# Patient Record
Sex: Female | Born: 1950 | Race: White | Hispanic: No | Marital: Single | Smoking: Never smoker
Health system: Southern US, Community
[De-identification: ages and names within clinical notes are randomized; demographics above are authoritative.]

## PROBLEM LIST (undated history)

## (undated) DIAGNOSIS — I10 Essential (primary) hypertension: Secondary | ICD-10-CM

## (undated) DIAGNOSIS — I482 Chronic atrial fibrillation, unspecified: Secondary | ICD-10-CM

## (undated) DIAGNOSIS — N183 Chronic kidney disease, stage 3 unspecified: Secondary | ICD-10-CM

## (undated) DIAGNOSIS — I6930 Unspecified sequelae of cerebral infarction: Secondary | ICD-10-CM

## (undated) DIAGNOSIS — M1990 Unspecified osteoarthritis, unspecified site: Secondary | ICD-10-CM

## (undated) DIAGNOSIS — F1910 Other psychoactive substance abuse, uncomplicated: Secondary | ICD-10-CM

## (undated) DIAGNOSIS — E119 Type 2 diabetes mellitus without complications: Secondary | ICD-10-CM

## (undated) HISTORY — DX: Essential (primary) hypertension: I10

## (undated) HISTORY — DX: Other psychoactive substance abuse, uncomplicated: F19.10

## (undated) HISTORY — DX: Unspecified osteoarthritis, unspecified site: M19.90

## (undated) HISTORY — DX: Type 2 diabetes mellitus without complications: E11.9

## (undated) HISTORY — PX: BREAST BIOPSY: SHX20

---

## 2003-01-18 ENCOUNTER — Other Ambulatory Visit: Payer: Self-pay

## 2008-02-24 ENCOUNTER — Ambulatory Visit: Payer: Self-pay | Admitting: Unknown Physician Specialty

## 2008-03-08 ENCOUNTER — Ambulatory Visit: Payer: Self-pay | Admitting: Internal Medicine

## 2009-09-19 ENCOUNTER — Emergency Department: Payer: Self-pay | Admitting: Emergency Medicine

## 2010-08-15 ENCOUNTER — Ambulatory Visit: Payer: Self-pay | Admitting: Internal Medicine

## 2011-08-21 ENCOUNTER — Ambulatory Visit: Payer: Self-pay | Admitting: Internal Medicine

## 2012-06-01 IMAGING — CT CT MAXILLOFACIAL WITHOUT CONTRAST
1 series · 16 of 30 positions shown, 20 images · non-contrast
Comparison: None.

REASON FOR EXAM: fall  pain to  right periorbital
COMMENTS:

PROCEDURE:     CT  - CT MAXILLOFACIAL AREA WO  - September 19, 2009  [DATE]
RESULT:     CT of the face..
TECHNIQUE: Multiple axial images were obtained of the phase, without
intravenous contrast. Coronal reformations were performed.

[Series 2: facial 3.0 h60f · axial · 0.31mm/px · z∈[-62,+76]mm · 16 of 50 slices shown, 20 images]
[im 2/50  brain]
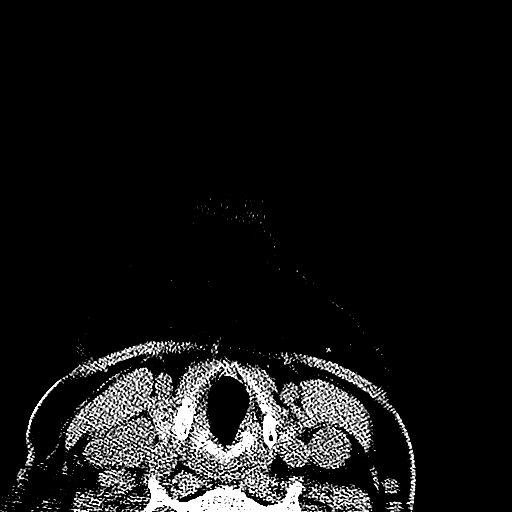
[im 2/50  bone]
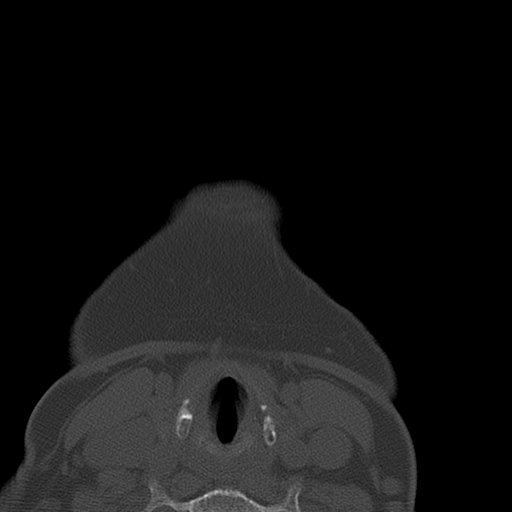
[im 6/50  bone]
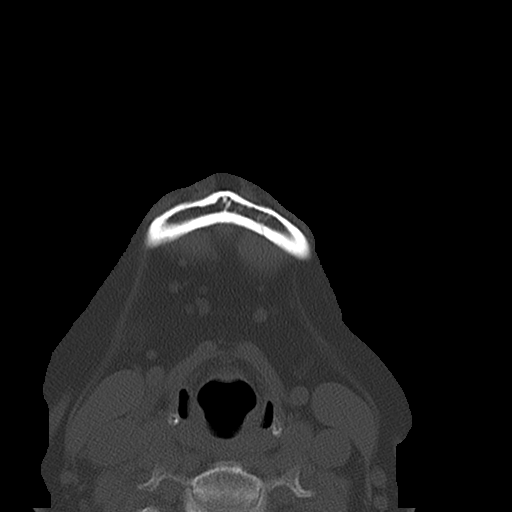
[im 9/50  bone]
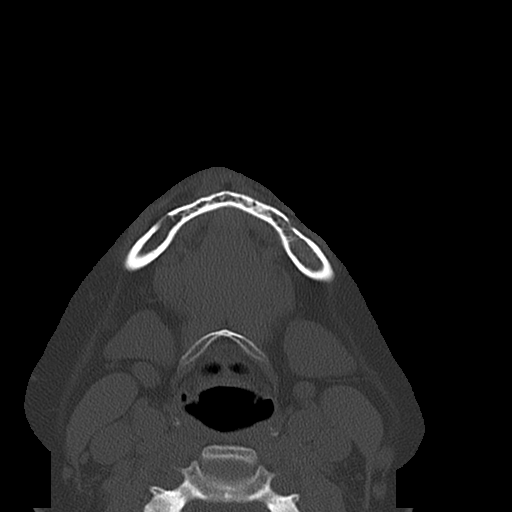
[im 11/50  bone]
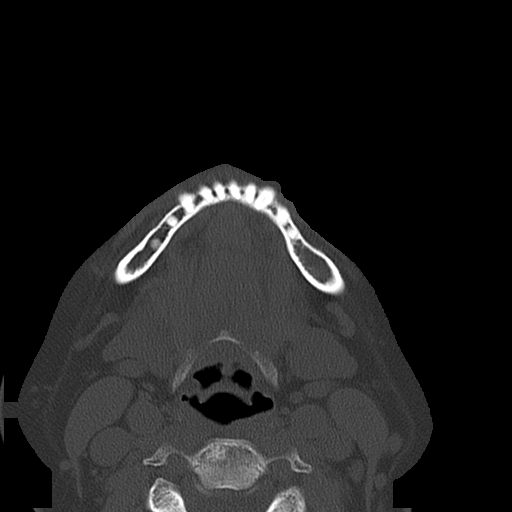
[im 14/50  brain]
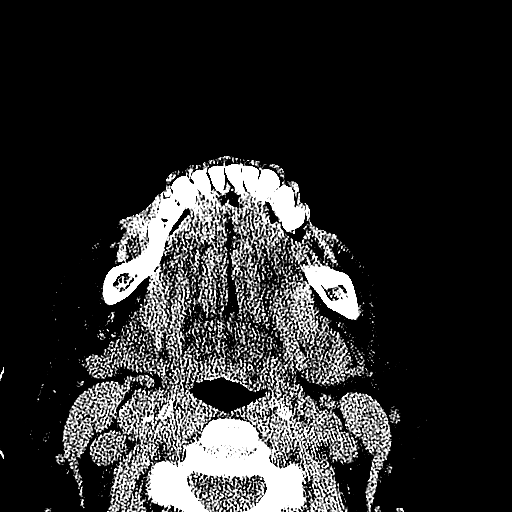
[im 14/50  bone]
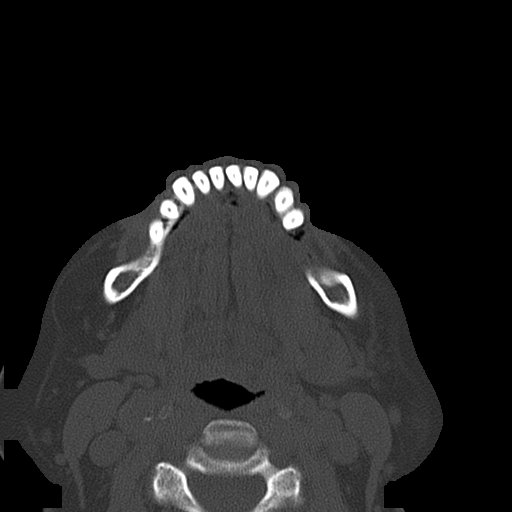
[im 19/50  bone]
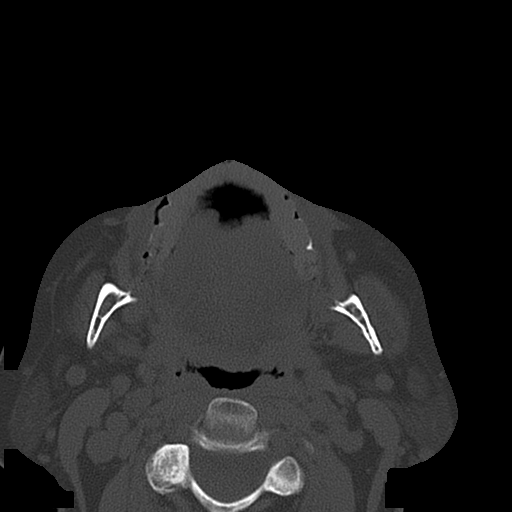
[im 21/50  bone]
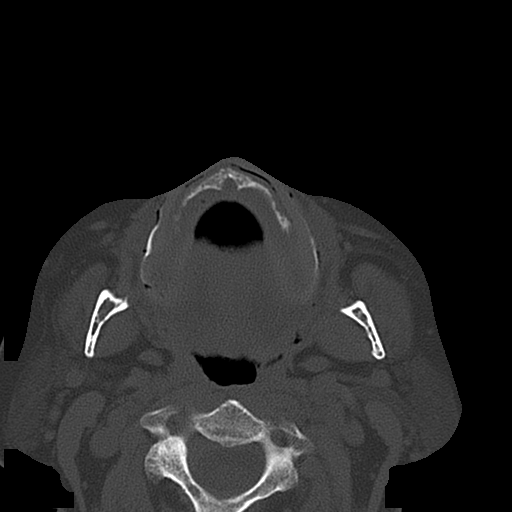
[im 24/50  bone]
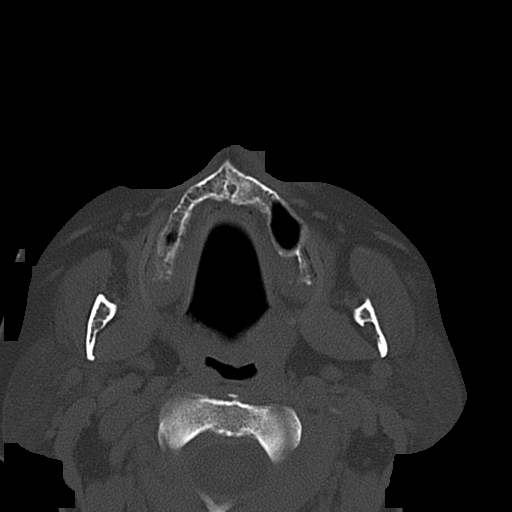
[im 28/50  brain]
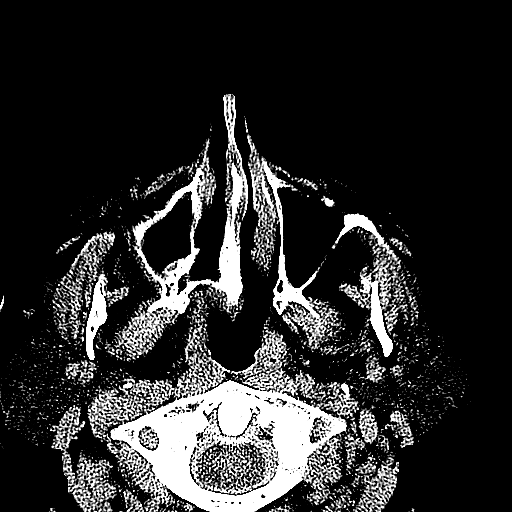
[im 28/50  bone]
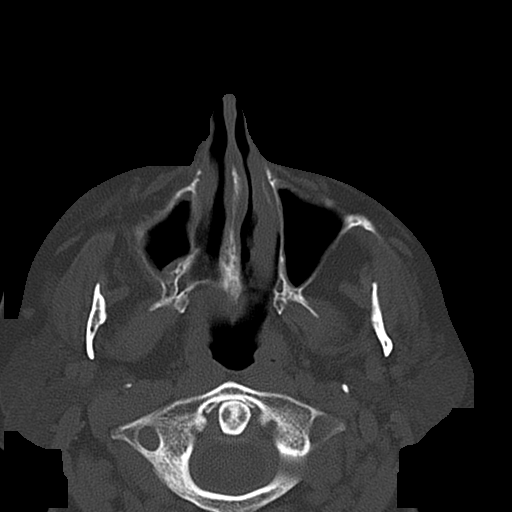
[im 31/50  bone]
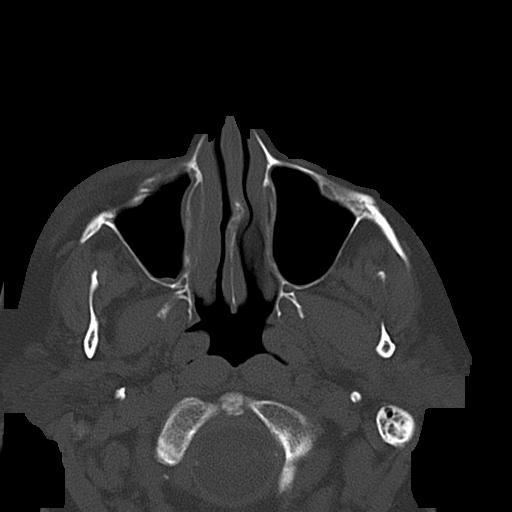
[im 33/50  bone]
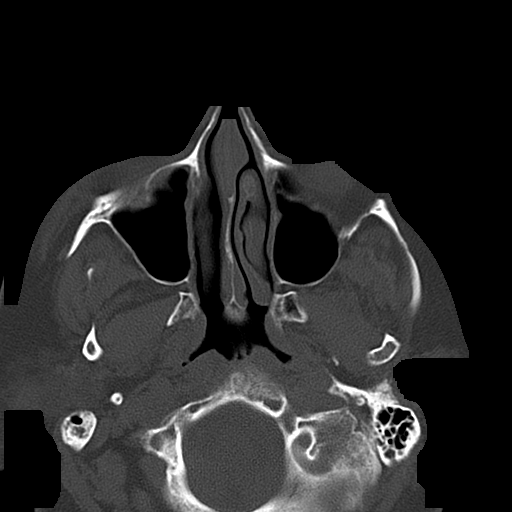
[im 36/50  bone]
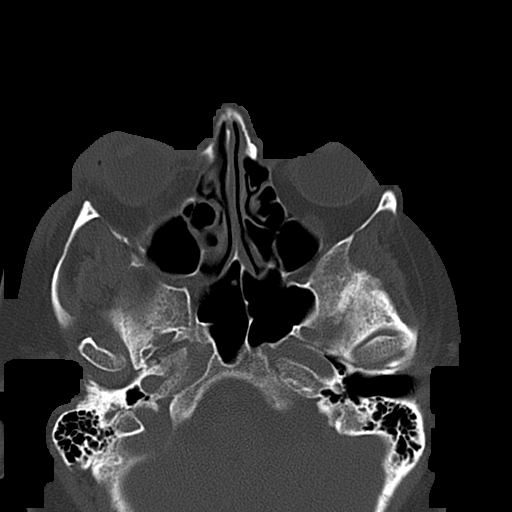
[im 39/50  brain]
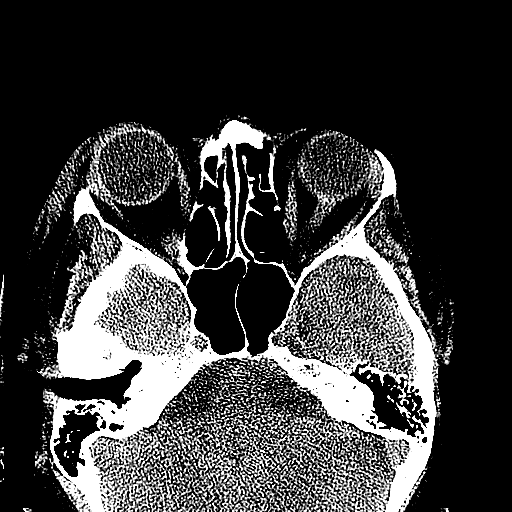
[im 39/50  bone]
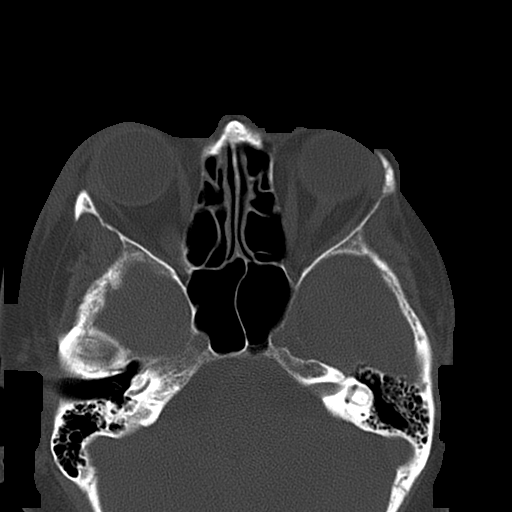
[im 41/50  bone]
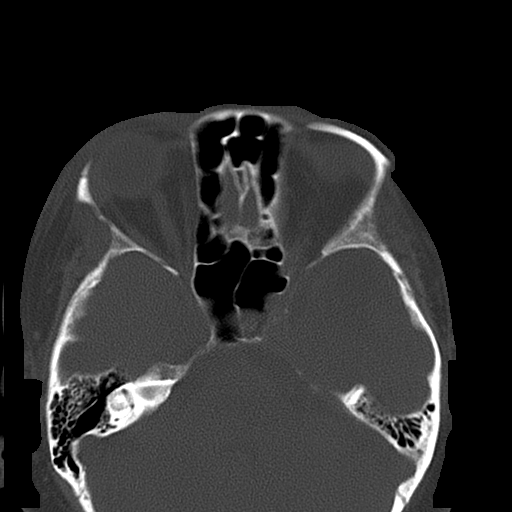
[im 44/50  bone]
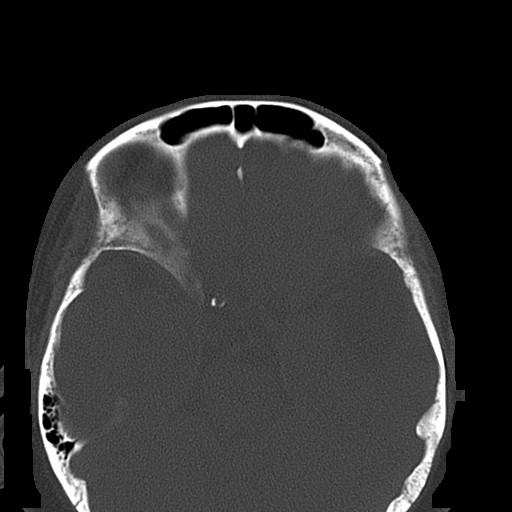
[im 48/50  bone]
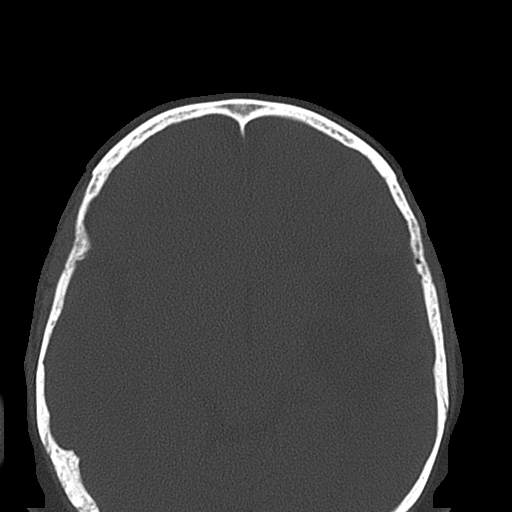

[16 of 30 positions shown; findings below may reference images not displayed]

FINDINGS: There is preseptal soft tissue edema superficial to the right
orbit. There is minimal buckling of the lateral wall of the right orbit.
This may represent a nondisplaced fracture, image 39. The floor of the orbit
is intact. The zygoma is intact.

There is minimal mucosal thickening of the right maxillary sinus and
sphenoid sinus.
IMPRESSION: Minimal buckling of the lateral wall of the right orbit may represent a
subtle nondisplaced fracture.

## 2012-08-25 ENCOUNTER — Ambulatory Visit: Payer: Self-pay | Admitting: Internal Medicine

## 2012-12-25 ENCOUNTER — Encounter: Payer: Self-pay | Admitting: Podiatry

## 2012-12-25 ENCOUNTER — Ambulatory Visit (INDEPENDENT_AMBULATORY_CARE_PROVIDER_SITE_OTHER): Payer: Medicare Other | Admitting: Podiatry

## 2012-12-25 VITALS — BP 138/90 | HR 96 | Resp 16 | Ht 64.0 in | Wt 238.0 lb

## 2012-12-25 DIAGNOSIS — Q828 Other specified congenital malformations of skin: Secondary | ICD-10-CM

## 2012-12-25 DIAGNOSIS — B351 Tinea unguium: Secondary | ICD-10-CM

## 2012-12-25 DIAGNOSIS — E1159 Type 2 diabetes mellitus with other circulatory complications: Secondary | ICD-10-CM

## 2012-12-25 DIAGNOSIS — M79609 Pain in unspecified limb: Secondary | ICD-10-CM

## 2012-12-25 NOTE — Progress Notes (Signed)
Subjective:     Patient ID: Sandra Massey, female   DOB: Feb 18, 1951, 62 y.o.   MRN: [id number]  HPI patient presents with caregiver stating my nails and I have these bad corns on my big toe second toe right and underneath my left fifth metatarsal. Patient is at risk diabetic and cannot care for herself secondary to stroke and diabetes  Review of Systems  All other systems reviewed and are negative.       Objective:   Physical Exam  Constitutional: She appears well-developed and well-nourished.  Neurological: She is alert.  Skin: Skin is dry.   Patient has nail disease 1-5 bilateral with painful debris noted and incurvation. 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

## 2013-04-02 ENCOUNTER — Encounter: Payer: Self-pay | Admitting: Podiatry

## 2013-04-02 ENCOUNTER — Ambulatory Visit (INDEPENDENT_AMBULATORY_CARE_PROVIDER_SITE_OTHER): Payer: Medicare HMO | Admitting: Podiatry

## 2013-04-02 VITALS — BP 122/75 | HR 85 | Resp 18

## 2013-04-02 DIAGNOSIS — Q828 Other specified congenital malformations of skin: Secondary | ICD-10-CM

## 2013-04-02 DIAGNOSIS — B351 Tinea unguium: Secondary | ICD-10-CM

## 2013-04-02 DIAGNOSIS — E1159 Type 2 diabetes mellitus with other circulatory complications: Secondary | ICD-10-CM

## 2013-04-02 DIAGNOSIS — M79609 Pain in unspecified limb: Secondary | ICD-10-CM

## 2013-04-02 NOTE — Progress Notes (Signed)
Subjective:     Patient ID: Sandra Massey, female   DOB: [birth year], 63 y.o.   MRN: [id number]  HPI patient presents with nail disease 1-5 both feet and keratotic lesion fifth metatarsal both feet and between the big toe and second toe right foot. States she cannot take care the nails or lesions do to stroke and long-term diabetes   Review of Systems     Objective:   Physical Exam Neurovascular status unchanged from previous visit with keratotic lesion x4 and nail disease with thickness and pain 1-5 both feet    Assessment:     Debridement nailbeds 1-5 both feet with no iatrogenic bleeding noted and debridement lesions on both feet with no iatrogenic bleeding noted    Plan:     Above is plan the assessment is mycotic nails with pain and porokeratotic lesion with at risk patient

## 2013-04-02 NOTE — Progress Notes (Signed)
Toenails trimmed , the calluses and the corns

## 2013-05-03 ENCOUNTER — Telehealth: Payer: Self-pay | Admitting: *Deleted

## 2013-05-03 NOTE — Telephone Encounter (Signed)
Called and left message letting pt know that dr Rosario Jacks has not signed diabetic shoe order. Asked for pt to call her doctors office to find out why he did not sign. (pt may need appt to see dr Rosario Jacks).

## 2013-05-25 ENCOUNTER — Encounter: Payer: Self-pay | Admitting: *Deleted

## 2013-05-25 NOTE — Progress Notes (Signed)
SENT PT POST CARD LETTING HER KNOW DIABETIC SHOES ARE HERE.

## 2013-06-01 ENCOUNTER — Ambulatory Visit (INDEPENDENT_AMBULATORY_CARE_PROVIDER_SITE_OTHER): Payer: Medicare HMO | Admitting: Podiatry

## 2013-06-01 ENCOUNTER — Encounter: Payer: Self-pay | Admitting: Podiatry

## 2013-06-01 DIAGNOSIS — M2040 Other hammer toe(s) (acquired), unspecified foot: Secondary | ICD-10-CM

## 2013-06-01 DIAGNOSIS — E1159 Type 2 diabetes mellitus with other circulatory complications: Secondary | ICD-10-CM

## 2013-06-01 DIAGNOSIS — M2010 Hallux valgus (acquired), unspecified foot: Secondary | ICD-10-CM

## 2013-06-02 NOTE — Progress Notes (Signed)
Subjective:     Patient ID: Sandra Massey, female   DOB: 28-May-1950, 63 y.o.   MRN: [id number]  HPI patient presents to pick up diabetic shoes with structural issues in her feet inability to walk with any degree of comfort and long-term diabetes   Review of Systems     Objective:   Physical Exam Chronic structural disease with long-term diabetes    Assessment:     Neurovascular status unchanged with structural deformity of feet him at risk condition    Plan:     Long-term diabetes with problems diabetic shoes were prescribed and fitted today they fit well and are comfortable for

## 2013-07-02 ENCOUNTER — Ambulatory Visit: Payer: Medicare HMO | Admitting: Podiatry

## 2013-07-09 ENCOUNTER — Ambulatory Visit (INDEPENDENT_AMBULATORY_CARE_PROVIDER_SITE_OTHER): Payer: Medicare HMO | Admitting: Podiatry

## 2013-07-09 VITALS — Resp 16 | Ht 65.0 in | Wt 230.0 lb

## 2013-07-09 DIAGNOSIS — B351 Tinea unguium: Secondary | ICD-10-CM

## 2013-07-09 DIAGNOSIS — Q828 Other specified congenital malformations of skin: Secondary | ICD-10-CM

## 2013-07-09 DIAGNOSIS — E1159 Type 2 diabetes mellitus with other circulatory complications: Secondary | ICD-10-CM

## 2013-07-09 DIAGNOSIS — M79609 Pain in unspecified limb: Secondary | ICD-10-CM

## 2013-07-11 NOTE — Progress Notes (Signed)
Subjective:     Patient ID: Sandra Massey, female   DOB: Mar 11, 1951, 63 y.o.   MRN: [id number]  HPI patient is a poor health individuals long-term diabetes history of stroke and nail disease 1-5 both feet with thick lesions on both feet they become painful and are impossible for her to cut   Review of Systems     Objective:   Physical Exam Neurovascular status unchanged with patient well oriented but does have caregiver. Nailbeds are brittle thick yellow and painful when pressed and there is keratotic lesion sub-first metatarsal of both feet    Assessment:     At risk diabetic who wears shoes which are helpful but has nail disease which forms that are painful with mycosis and lesions with porokeratotic type base    Plan:     Debridement of painful nailbeds 1-5 both feet and debridement of lesions on both feet with no iatrogenic bleeding noted

## 2013-08-27 ENCOUNTER — Ambulatory Visit: Payer: Self-pay | Admitting: Internal Medicine

## 2013-10-08 ENCOUNTER — Ambulatory Visit: Payer: Medicare HMO | Admitting: Podiatry

## 2013-10-15 ENCOUNTER — Ambulatory Visit (INDEPENDENT_AMBULATORY_CARE_PROVIDER_SITE_OTHER): Payer: Medicare HMO | Admitting: Podiatry

## 2013-10-15 DIAGNOSIS — Q828 Other specified congenital malformations of skin: Secondary | ICD-10-CM

## 2013-10-15 DIAGNOSIS — M79609 Pain in unspecified limb: Secondary | ICD-10-CM

## 2013-10-15 DIAGNOSIS — B351 Tinea unguium: Secondary | ICD-10-CM

## 2013-10-15 DIAGNOSIS — E1159 Type 2 diabetes mellitus with other circulatory complications: Secondary | ICD-10-CM

## 2013-10-15 DIAGNOSIS — M79676 Pain in unspecified toe(s): Secondary | ICD-10-CM

## 2013-10-15 NOTE — Progress Notes (Signed)
Subjective:     Patient ID: Sandra Massey, female   DOB: 02/08/1951, 63 y.o.   MRN: [id number]  HPI patient presents with significant nail disease 1-5 both feet with pain and keratotic lesion fifth digit right that's painful when pressed and making shoe gear difficult along with long-term diabetes   Review of Systems     Objective:   Physical Exam Neurovascular status is diminished both sharp Dole vibratory and circulatory status bilateral with varicosities in the ankle and mild edema noted patient's found to have keratotic lesion fifth metatarsal right lateral side and nail disease with thickness 1-5 of both feet    Assessment:     Mycotic nail infection both feet and porokeratotic type lesion and at risk diabetic    Plan:     Debridement nailbeds 1-5 both feet with no iatrogenic bleeding and debridement lesion right with no iatrogenic bleeding noted

## 2014-01-14 ENCOUNTER — Other Ambulatory Visit: Payer: Medicare HMO

## 2014-01-21 ENCOUNTER — Ambulatory Visit (INDEPENDENT_AMBULATORY_CARE_PROVIDER_SITE_OTHER): Payer: Medicare HMO | Admitting: Podiatry

## 2014-01-21 DIAGNOSIS — B351 Tinea unguium: Secondary | ICD-10-CM

## 2014-01-21 DIAGNOSIS — M79676 Pain in unspecified toe(s): Secondary | ICD-10-CM

## 2014-01-21 NOTE — Progress Notes (Signed)
Subjective:     Patient ID: Sandra Massey, female   DOB: 1950-06-27, 63 y.o.   MRN: [id number]  HPI patient presents with significant nail disease 1-5 both feet with pain and keratotic lesion fifth digit right that's painful when pressed and making shoe gear difficult along with long-term diabetes   Review of Systems     Objective:   Physical Exam Neurovascular status is diminished both sharp Dole vibratory and circulatory status bilateral with varicosities in the ankle and mild edema noted patient's found to have keratotic lesion fifth metatarsal right lateral side and nail disease with thickness 1-5 of both feet    Assessment:     Mycotic nail infection both feet and porokeratotic type lesion and at risk diabetic    Plan:     Debridement nailbeds 1-5 both feet with no iatrogenic bleeding and debridement lesion right with no iatrogenic bleeding noted

## 2014-04-22 ENCOUNTER — Other Ambulatory Visit: Payer: Medicare HMO

## 2014-05-06 ENCOUNTER — Ambulatory Visit (INDEPENDENT_AMBULATORY_CARE_PROVIDER_SITE_OTHER): Payer: Medicare HMO | Admitting: Podiatry

## 2014-05-06 DIAGNOSIS — M79676 Pain in unspecified toe(s): Secondary | ICD-10-CM

## 2014-05-06 DIAGNOSIS — E1151 Type 2 diabetes mellitus with diabetic peripheral angiopathy without gangrene: Secondary | ICD-10-CM

## 2014-05-06 DIAGNOSIS — Q828 Other specified congenital malformations of skin: Secondary | ICD-10-CM

## 2014-05-06 DIAGNOSIS — B351 Tinea unguium: Secondary | ICD-10-CM

## 2014-05-06 DIAGNOSIS — M2041 Other hammer toe(s) (acquired), right foot: Secondary | ICD-10-CM

## 2014-05-06 NOTE — Progress Notes (Signed)
Subjective:     Patient ID: Sandra Massey, female   DOB: 09/15/1950, 64 y.o.   MRN: [id number]  HPI patient presents with significant nail disease 1-5 both feet with pain and keratotic lesion fifth digit right that's painful when pressed and making shoe gear difficult along with long-term diabetes   Review of Systems     Objective:   Physical Exam Neurovascular status is diminished both sharp Dole vibratory and circulatory status bilateral with varicosities in the ankle and mild edema noted patient's found to have keratotic lesion fifth metatarsal right lateral side and nail disease with thickness 1-5 of both feet    Assessment:     Mycotic nail infection both feet and porokeratotic type lesion and at risk diabetic    Plan:     Debridement nailbeds 1-5 both feet with no iatrogenic bleeding and debridement lesion right with no iatrogenic bleeding noted   HM noted and reduced R 1-2 toes.

## 2014-05-06 NOTE — Progress Notes (Signed)
Subjective:     Patient ID: Sandra Massey, female   DOB: 02-24-1951, 64 y.o.   MRN: [id number]  HPI long-term diabetic who presents his had history of stroke and has nail disease 1-5 both feet that are thick and she cannot cut and become painful and lesion fifth right and has done well in diabetic shoes but needs a new pair at this time   Review of Systems     Objective:   Physical Exam Neurovascular status is diminished with diminished pedal pulses PT and DP diminished hair growth and diminished Fill time. I noted also there is some diminishment of sharp dull and vibratory with keratotic lesion fifth right that's painful and nail disease with thickness yellow brittle debris 1-5 both feet that are painful    Assessment:     At risk diabetic with mycotic nail infection lesion and pre-ulcerative type lesion on the fifth toe of the right foot    Plan:     Reviewed all conditions and I do think it would be best to go ahead and get her into new diabetic shoes which we will get certified. Debrided nailbeds 1-5 both feet and lesion right with no iatrogenic bleeding noted

## 2014-06-24 ENCOUNTER — Ambulatory Visit: Payer: Medicare HMO

## 2014-06-28 ENCOUNTER — Ambulatory Visit (INDEPENDENT_AMBULATORY_CARE_PROVIDER_SITE_OTHER): Payer: Medicare HMO | Admitting: Podiatry

## 2014-06-28 ENCOUNTER — Encounter: Payer: Self-pay | Admitting: Podiatry

## 2014-06-28 DIAGNOSIS — E1159 Type 2 diabetes mellitus with other circulatory complications: Secondary | ICD-10-CM

## 2014-06-28 DIAGNOSIS — L97509 Non-pressure chronic ulcer of other part of unspecified foot with unspecified severity: Secondary | ICD-10-CM | POA: Diagnosis not present

## 2014-06-28 DIAGNOSIS — E1151 Type 2 diabetes mellitus with diabetic peripheral angiopathy without gangrene: Secondary | ICD-10-CM

## 2014-06-28 DIAGNOSIS — E1140 Type 2 diabetes mellitus with diabetic neuropathy, unspecified: Secondary | ICD-10-CM

## 2014-06-28 DIAGNOSIS — E1342 Other specified diabetes mellitus with diabetic polyneuropathy: Secondary | ICD-10-CM | POA: Diagnosis not present

## 2014-06-28 NOTE — Progress Notes (Signed)
Subjective:     Patient ID: Sandra Massey, female   DOB: [birth year], 64 y.o.   MRN: [id number]  HPI long-term diabetic with structural deformities of her feet presents for diabetic shoes that she has had for a number of years and have  prevent ulceration   Review of Systems     Objective:   Physical Exam Neurovascular status intact muscle strength adequate range of motion within normal limits with diminishment of vascular status both DP PT pulses diminished hair growth and structural bunion deformity bilateral    Assessment:     At risk diabetic with structural deformity    Plan:     Diabetic shoes were dispensed with all instructions on usage and reappoint for routine care

## 2014-06-28 NOTE — Patient Instructions (Signed)

## 2014-08-05 ENCOUNTER — Ambulatory Visit: Payer: Medicare HMO

## 2014-10-25 ENCOUNTER — Ambulatory Visit (INDEPENDENT_AMBULATORY_CARE_PROVIDER_SITE_OTHER): Payer: Medicare HMO | Admitting: Podiatry

## 2014-10-25 DIAGNOSIS — M79676 Pain in unspecified toe(s): Secondary | ICD-10-CM

## 2014-10-25 DIAGNOSIS — B351 Tinea unguium: Secondary | ICD-10-CM | POA: Diagnosis not present

## 2014-10-25 DIAGNOSIS — L84 Corns and callosities: Secondary | ICD-10-CM

## 2014-10-25 NOTE — Progress Notes (Signed)
Subjective: 64 y.o. returns the office today for painful, elongated, thickened toenails which she is unable to trim herself. Denies any redness or drainage around the nails. Also has a corn between the right 1st and 2nd digits. No redness/drainage. Denies any acute changes since last appointment and no new complaints today. Denies any systemic complaints such as fevers, chills, nausea, vomiting.   Objective: AAO 3, NAD DP/PT pulses decreased, CRT less than 3 seconds Nails hypertrophic, dystrophic, elongated, brittle, discolored 10. There is tenderness overlying the nails 1-5 bilaterally. There is no surrounding erythema or drainage along the nail sites. Associated hyperkeratotic lesion in the right first interspace and the first and second digit in the sulcus. Upon debridement no underlying ulceration drainage or other signs of infection. No open lesions or pre-ulcerative lesions are identified. No other areas of tenderness bilateral lower extremities. No overlying edema, erythema, increased warmth. No pain with calf compression, swelling, warmth, erythema.  Assessment: Patient presents with symptomatic onychomycosis, heloma molle right first interspace  Plan: -Treatment options including alternatives, risks, complications were discussed -Nails sharply debrided *[id number] complication/bleeding. -Right first interspace lesion sharply debrided without complication/bleeding. -Discussed daily foot inspection. If there are any changes, to call the office immediately.  -Follow-up in 3 months or sooner if any problems are to arise. In the meantime, encouraged to call the office with any questions, concerns, changes symptoms.   Celesta Gentile, DPM

## 2014-12-21 ENCOUNTER — Emergency Department: Payer: Medicare HMO

## 2014-12-21 ENCOUNTER — Emergency Department
Admission: EM | Admit: 2014-12-21 | Discharge: 2014-12-21 | Disposition: A | Discharge status: Home / Self Care | Payer: Medicare HMO | Attending: Emergency Medicine | Admitting: Emergency Medicine

## 2014-12-21 DIAGNOSIS — Y9389 Activity, other specified: Secondary | ICD-10-CM | POA: Diagnosis not present

## 2014-12-21 DIAGNOSIS — Z79899 Other long term (current) drug therapy: Secondary | ICD-10-CM | POA: Diagnosis not present

## 2014-12-21 DIAGNOSIS — S46911A Strain of unspecified muscle, fascia and tendon at shoulder and upper arm level, right arm, initial encounter: Secondary | ICD-10-CM | POA: Insufficient documentation

## 2014-12-21 DIAGNOSIS — E119 Type 2 diabetes mellitus without complications: Secondary | ICD-10-CM | POA: Diagnosis not present

## 2014-12-21 DIAGNOSIS — Y998 Other external cause status: Secondary | ICD-10-CM | POA: Diagnosis not present

## 2014-12-21 DIAGNOSIS — G8929 Other chronic pain: Secondary | ICD-10-CM | POA: Insufficient documentation

## 2014-12-21 DIAGNOSIS — S161XXA Strain of muscle, fascia and tendon at neck level, initial encounter: Secondary | ICD-10-CM | POA: Diagnosis not present

## 2014-12-21 DIAGNOSIS — Y92002 Bathroom of unspecified non-institutional (private) residence single-family (private) house as the place of occurrence of the external cause: Secondary | ICD-10-CM | POA: Diagnosis not present

## 2014-12-21 DIAGNOSIS — I10 Essential (primary) hypertension: Secondary | ICD-10-CM | POA: Diagnosis not present

## 2014-12-21 DIAGNOSIS — W1839XA Other fall on same level, initial encounter: Secondary | ICD-10-CM | POA: Diagnosis not present

## 2014-12-21 DIAGNOSIS — Z91040 Latex allergy status: Secondary | ICD-10-CM | POA: Insufficient documentation

## 2014-12-21 DIAGNOSIS — Z7982 Long term (current) use of aspirin: Secondary | ICD-10-CM | POA: Insufficient documentation

## 2014-12-21 DIAGNOSIS — S199XXA Unspecified injury of neck, initial encounter: Secondary | ICD-10-CM | POA: Diagnosis present

## 2014-12-21 MED ORDER — CARISOPRODOL 350 MG PO TABS: 350.0000 mg | ORAL_TABLET | Freq: Three times a day (TID) | ORAL | Status: DC

## 2014-12-21 NOTE — ED Notes (Signed)
Pain right shoulder "rotary cuff" per fam member.  And neck pain.  fam member says pt has hx stroke that limits her speech.

## 2014-12-21 NOTE — ED Provider Notes (Signed)
[facility] Provider Note  ____________________________________________  Time seen: Approximately 11:15 AM  I have reviewed the triage vital signs and the nursing notes.   HISTORY  Chief Complaint Torticollis and Shoulder Pain   HPI Sandra Massey is a 64 y.o. female who presents to the emergency department for evaluation of neck pain and right shoulder pain. She has a history of chronic right shoulder pain, but this is worse after falling approximately one week ago. Her caretaker states that she fell in the bathroom and "knock the commode off of its base." She was not evaluated after the fall. For the past 3 days she has been complaining of increasing neck pain and right shoulder pain. The patient has a significant history of CVA on the right side which has left her weak. The patient denies an increase in weakness. The caretaker denies a change in or cognitive status or mobility.   Past Medical History  Diagnosis Date  . Substance abuse   . Hypertension   . Arthritis   . Diabetes mellitus without complication     There are no active problems to display for this patient.   No past surgical history on file.  Current Outpatient Rx  Name  Route  Sig  Dispense  Refill  . aspirin 325 MG tablet   Oral   Take 325 mg by mouth daily.         . calcium carbonate (OS-CAL) 600 MG TABS tablet   Oral   Take 600 mg by mouth daily.         . carisoprodol (SOMA) 350 MG tablet   Oral   Take 1 tablet (350 mg total) by mouth 3 (three) times daily.   15 tablet   0   . cholecalciferol (VITAMIN D) 1000 UNITS tablet   Oral   Take 1,000 Units by mouth daily.         Marland Kitchen escitalopram (LEXAPRO) 10 MG tablet   Oral   Take 10 mg by mouth daily.         Marland Kitchen glipiZIDE (GLUCOTROL) 5 MG tablet   Oral   Take 5 mg by mouth daily before breakfast.         . lisinopril-hydrochlorothiazide (PRINZIDE,ZESTORETIC) 10-12.5 MG per tablet   Oral  Take 1 tablet by mouth daily.         . metFORMIN (GLUCOPHAGE) 1000 MG tablet   Oral   Take 1,000 mg by mouth 2 (two) times daily with a meal.         . saxagliptin HCl (ONGLYZA) 5 MG TABS tablet   Oral   Take 5 mg by mouth daily.         . simvastatin (ZOCOR) 40 MG tablet   Oral   Take 40 mg by mouth every evening.           Allergies Latex  No family history on file.  Social History Social History  Substance Use Topics  . Smoking status: Never Smoker   . Smokeless tobacco: Not on file     Comment: 2007 quit   . Alcohol Use: No    Review of Systems Constitutional: No recent illness. Eyes: No visual changes. ENT: No sore throat. Cardiovascular: Denies chest pain or palpitations. Respiratory: Denies shortness of breath. Gastrointestinal: No abdominal pain.  Genitourinary: Negative for dysuria. Musculoskeletal: Pain in neck and right shoulder Skin: Negative for rash. Neurological: Negative for headaches, focal weakness or numbness. 10-point ROS otherwise negative.  ____________________________________________   PHYSICAL EXAM:  VITAL SIGNS: ED Triage Vitals  Enc Vitals Group     BP 12/21/14 1007 161/96 mmHg     Pulse Rate 12/21/14 1007 96     Resp 12/21/14 1007 18     Temp 12/21/14 1007 97.5 F (36.4 C)     Temp src --      SpO2 12/21/14 1007 93 %     Weight --      Height --      Head Cir --      Peak Flow --      Pain Score 12/21/14 1006 8     Pain Loc --      Pain Edu? --      Excl. in [place]? --     Constitutional: Alert and oriented. Well appearing and in no acute distress. Eyes: Conjunctivae are normal. EOMI. Head: Atraumatic. Nose: No congestion/rhinnorhea. Neck: No stridor.  Respiratory: Normal respiratory effort.   Musculoskeletal: Tenderness diffuse over the anterior and posterior neck without focal tenderness, tenderness noted over the anterior portion of the shoulder. There is limited range of motion at baseline due to an old CVA,  the patient denies an increase in limitation of the shoulder. Neurologic:  Normal speech and language. No gross focal neurologic deficits are appreciated. Speech is normal. No gait instability. Skin:  Skin is warm, dry and intact. Atraumatic. Psychiatric: Mood and affect are normal. Speech and behavior are normal.  ____________________________________________   LABS (all labs ordered are listed, but only abnormal results are displayed)  Labs Reviewed - No data to display ____________________________________________  RADIOLOGY  CT cervical spine as well as plain film of the right shoulder showed no new or acute abnormality. ____________________________________________   PROCEDURES  Procedure(s) performed: None   ____________________________________________   INITIAL IMPRESSION / ASSESSMENT AND PLAN / ED COURSE  Pertinent labs & imaging results that were available during my care of the patient were reviewed by me and considered in my medical decision making (see chart for details).  Patient was advised to follow-up with her primary care provider for symptoms that are not improving over the next 5-7 days. She was advised to return to the emergency department for symptoms change or worsen if unable to schedule an appointment. ____________________________________________   FINAL CLINICAL IMPRESSION(S) / ED DIAGNOSES  Final diagnoses:  Cervical strain, acute, initial encounter  Shoulder strain, right, initial encounter       Victorino Dike, Burt 12/21/14 [place], MD 12/21/14 [phone]

## 2014-12-21 NOTE — Discharge Instructions (Signed)
Cervical Sprain  A cervical sprain is an injury in the neck in which the strong, fibrous tissues (ligaments) that connect your neck bones stretch or tear. Cervical sprains can range from mild to severe. Severe cervical sprains can cause the neck vertebrae to be unstable. This can lead to damage of the spinal cord and can result in serious nervous system problems. The amount of time it takes for a cervical sprain to get better depends on the cause and extent of the injury. Most cervical sprains heal in 1 to 3 weeks.  CAUSES   Severe cervical sprains may be caused by:    Contact sport injuries (such as from football, rugby, wrestling, hockey, auto racing, gymnastics, diving, martial arts, or boxing).    Motor vehicle collisions.    Whiplash injuries. This is an injury from a sudden forward and backward whipping movement of the head and neck.   Falls.   Mild cervical sprains may be caused by:    Being in an awkward position, such as while cradling a telephone between your ear and shoulder.    Sitting in a chair that does not offer proper support.    Working at a poorly designed computer station.    Looking up or down for long periods of time.   SYMPTOMS    Pain, soreness, stiffness, or a burning sensation in the front, back, or sides of the neck. This discomfort may develop immediately after the injury or slowly, 24 hours or more after the injury.    Pain or tenderness directly in the middle of the back of the neck.    Shoulder or upper back pain.    Limited ability to move the neck.    Headache.    Dizziness.    Weakness, numbness, or tingling in the hands or arms.    Muscle spasms.    Difficulty swallowing or chewing.    Tenderness and swelling of the neck.   DIAGNOSIS   Most of the time your health care provider can diagnose a cervical sprain by taking your history and doing a physical exam. Your health care provider will ask about previous neck injuries and any known neck  problems, such as arthritis in the neck. X-rays may be taken to find out if there are any other problems, such as with the bones of the neck. Other tests, such as a CT scan or MRI, may also be needed.   TREATMENT   Treatment depends on the severity of the cervical sprain. Mild sprains can be treated with rest, keeping the neck in place (immobilization), and pain medicines. Severe cervical sprains are immediately immobilized. Further treatment is done to help with pain, muscle spasms, and other symptoms and may include:   Medicines, such as pain relievers, numbing medicines, or muscle relaxants.    Physical therapy. This may involve stretching exercises, strengthening exercises, and posture training. Exercises and improved posture can help stabilize the neck, strengthen muscles, and help stop symptoms from returning.   HOME CARE INSTRUCTIONS    Put ice on the injured area.     Put ice in a plastic bag.     Place a towel between your skin and the bag.     Leave the ice on for 15-20 minutes, 3-4 times a day.    If your injury was severe, you may have been given a cervical collar to wear. A cervical collar is a two-piece collar designed to keep your neck from moving while it heals.      Do not remove the collar unless instructed by your health care provider.    If you have long hair, keep it outside of the collar.    Ask your health care provider before making any adjustments to your collar. Minor adjustments may be required over time to improve comfort and reduce pressure on your chin or on the back of your head.    Ifyou are allowed to remove the collar for cleaning or bathing, follow your health care provider's instructions on how to do so safely.    Keep your collar clean by wiping it with mild soap and water and drying it completely. If the collar you have been given includes removable pads, remove them every 1-2 days and hand wash them with soap and water. Allow them to air dry. They should be completely  dry before you wear them in the collar.    If you are allowed to remove the collar for cleaning and bathing, wash and dry the skin of your neck. Check your skin for irritation or sores. If you see any, tell your health care provider.    Do not drive while wearing the collar.    Only take over-the-counter or prescription medicines for pain, discomfort, or fever as directed by your health care provider.    Keep all follow-up appointments as directed by your health care provider.    Keep all physical therapy appointments as directed by your health care provider.    Make any needed adjustments to your workstation to promote good posture.    Avoid positions and activities that make your symptoms worse.    Warm up and stretch before being active to help prevent problems.   SEEK MEDICAL CARE IF:    Your pain is not controlled with medicine.    You are unable to decrease your pain medicine over time as planned.    Your activity level is not improving as expected.   SEEK IMMEDIATE MEDICAL CARE IF:    You develop any bleeding.   You develop stomach upset.   You have signs of an allergic reaction to your medicine.    Your symptoms get worse.    You develop new, unexplained symptoms.    You have numbness, tingling, weakness, or paralysis in any part of your body.   MAKE SURE YOU:    Understand these instructions.   Will watch your condition.   Will get help right away if you are not doing well or get worse.     This information is not intended to replace advice given to you by your health care provider. Make sure you discuss any questions you have with your health care provider.     Document Released: 12/23/2006 Document Revised: 03/02/2013 Document Reviewed: 09/02/2012  [employer] Interactive Patient Education 2016 [employer]

## 2015-01-26 ENCOUNTER — Ambulatory Visit: Payer: Medicare HMO

## 2015-07-07 ENCOUNTER — Encounter: Payer: Medicare HMO | Attending: Surgery | Admitting: Surgery

## 2015-07-07 DIAGNOSIS — Z8249 Family history of ischemic heart disease and other diseases of the circulatory system: Secondary | ICD-10-CM | POA: Diagnosis not present

## 2015-07-07 DIAGNOSIS — Z7984 Long term (current) use of oral hypoglycemic drugs: Secondary | ICD-10-CM | POA: Diagnosis not present

## 2015-07-07 DIAGNOSIS — Z79899 Other long term (current) drug therapy: Secondary | ICD-10-CM | POA: Insufficient documentation

## 2015-07-07 DIAGNOSIS — W57XXXA Bitten or stung by nonvenomous insect and other nonvenomous arthropods, initial encounter: Secondary | ICD-10-CM | POA: Diagnosis not present

## 2015-07-07 DIAGNOSIS — L97222 Non-pressure chronic ulcer of left calf with fat layer exposed: Secondary | ICD-10-CM | POA: Insufficient documentation

## 2015-07-07 DIAGNOSIS — Z8673 Personal history of transient ischemic attack (TIA), and cerebral infarction without residual deficits: Secondary | ICD-10-CM | POA: Insufficient documentation

## 2015-07-07 DIAGNOSIS — Z87891 Personal history of nicotine dependence: Secondary | ICD-10-CM | POA: Diagnosis not present

## 2015-07-07 DIAGNOSIS — I10 Essential (primary) hypertension: Secondary | ICD-10-CM | POA: Insufficient documentation

## 2015-07-07 DIAGNOSIS — Z7982 Long term (current) use of aspirin: Secondary | ICD-10-CM | POA: Insufficient documentation

## 2015-07-07 DIAGNOSIS — E11622 Type 2 diabetes mellitus with other skin ulcer: Secondary | ICD-10-CM | POA: Insufficient documentation

## 2015-07-07 DIAGNOSIS — E785 Hyperlipidemia, unspecified: Secondary | ICD-10-CM | POA: Diagnosis not present

## 2015-07-08 NOTE — Progress Notes (Addendum)
Sandra Massey ([id number]) Visit Report for 07/07/2015 Allergy List Details Patient Name: Sandra Massey, Sandra Massey Date of Service: 07/07/2015 3:00 PM Medical Record Number: [id number] Patient Account Number: [id number] Date of Birth/Sex: December 27, 1950 (64 y.o. Female) Treating RN: Sandra Massey Primary Care Physician: Sandra Massey Other Clinician: Referring Physician: Casilda Massey Treating Physician/Extender: Sandra Massey in Treatment: 0 Allergies Active Allergies adhesive tape Allergy Notes Electronic Signature(s) Signed: 07/07/2015 4:53:26 PM By: Sandra Cool RN, BSN, Kim RN, BSN Entered By: Sandra Cool, RN, BSN, Sandra Massey on 07/07/2015 14:59:07 Sandra Massey ([id number]) -------------------------------------------------------------------------------- Arrival Information Details Patient Name: Sandra Massey Date of Service: 07/07/2015 3:00 PM Medical Record Number: [id number] Patient Account Number: [id number] Date of Birth/Sex: 08/14/1950 (64 y.o. Female) Treating RN: Sandra Massey Primary Care Physician: Sandra Massey Other Clinician: Referring Physician: Casilda Massey Treating Physician/Extender: Sandra Massey in Treatment: 0 Visit Information Patient Arrived: Ambulatory Arrival Time: 14:44 Accompanied By: sister, Sandra Massey Transfer Assistance: None Patient Identification Verified: Yes Secondary Verification Process Yes Completed: Patient Has Alerts: Yes Patient Alerts: Patient on Blood Thinner ASA Electronic Signature(s) Signed: 07/07/2015 4:53:26 PM By: Sandra Cool, RN, BSN, Kim RN, BSN Entered By: Sandra Cool, RN, BSN, Sandra Massey on 07/07/2015 14:45:14 Sandra Massey ([id number]) -------------------------------------------------------------------------------- Clinic Level of Care Assessment Details Patient Name: Sandra Massey Date of Service: 07/07/2015 3:00 PM Medical Record Number: [id number] Patient Account Number: [id number] Date of Birth/Sex: 01-07-1951 (64 y.o. Female) Treating RN: Sandra Massey Primary Care Physician: Sandra Massey Other Clinician: Referring Physician: Casilda Massey Treating Physician/Extender: Sandra Massey in Treatment: 0 Clinic Level of Care Assessment Items TOOL 1 Quantity Score []  - Use when EandM and Procedure is performed on INITIAL visit 0 ASSESSMENTS - Nursing Assessment / Reassessment X - General Physical Exam (combine w/ comprehensive assessment (listed just 1 20 below) when performed on new pt. evals) []  - Comprehensive Assessment (HX, ROS, Risk Assessments, Wounds Hx, etc.) 0 ASSESSMENTS - Wound and Skin Assessment / Reassessment []  - Dermatologic / Skin Assessment (not related to wound area) 0 ASSESSMENTS - Ostomy and/or Continence Assessment and Care []  - Incontinence Assessment and Management 0 []  - Ostomy Care Assessment and Management (repouching, etc.) 0 PROCESS - Coordination of Care X - Simple Patient / Family Education for ongoing care 1 15 []  - Complex (extensive) Patient / Family Education for ongoing care 0 X - Staff obtains Consents, Records, Test Results / Process Orders 1 10 []  - Staff telephones HHA, Nursing Homes / Clarify orders / etc 0 []  - Routine Transfer to another Facility (non-emergent condition) 0 []  - Routine Hospital Admission (non-emergent condition) 0 X - New Admissions / Biomedical engineer / Ordering NPWT, Apligraf, etc. 1 15 []  - Emergency Hospital Admission (emergent condition) 0 PROCESS - Special Needs []  - Pediatric / Minor Patient Management 0 []  - Isolation Patient Management 0 Sandra Massey, Sandra Massey ([id number]) []  - Hearing / Language / Visual special needs 0 []  - Assessment of Community assistance (transportation, D/C planning, etc.) 0 []  - Additional assistance / Altered mentation 0 []  - Support Surface(s) Assessment (bed, cushion, seat, etc.) 0 INTERVENTIONS - Miscellaneous []  - External ear exam 0 []  - Patient Transfer (multiple staff / Civil Service fast streamer / Similar devices) 0 []  - Simple Staple /  Suture removal (25 or less) 0 []  - Complex Staple / Suture removal (26 or more) 0 []  - Hypo/Hyperglycemic Management (do not check if billed separately) 0 X - Ankle / Brachial Index (ABI) - do not check if billed separately 1 15 Has the  patient been seen at the hospital within the last three years: Yes Total Score: 75 Level Of Care: New/Established - Level 2 Electronic Signature(s) Signed: 07/07/2015 4:53:26 PM By: Sandra Cool, RN, BSN, Kim RN, BSN Entered By: Sandra Cool, RN, BSN, Sandra Massey on 07/07/2015 15:31:42 Sandra Massey ([id number]) -------------------------------------------------------------------------------- Encounter Discharge Information Details Patient Name: Sandra Massey Date of Service: 07/07/2015 3:00 PM Medical Record Number: [id number] Patient Account Number: [id number] Date of Birth/Sex: 1950/03/22 (64 y.o. Female) Treating RN: Sandra Massey Primary Care Physician: Sandra Massey Other Clinician: Referring Physician: Casilda Massey Treating Physician/Extender: Sandra Massey in Treatment: 0 Encounter Discharge Information Items Discharge Pain Level: 0 Discharge Condition: Stable Ambulatory Status: Ambulatory Discharge Destination: Home Transportation: Private Auto Accompanied By: sister Schedule Follow-up Appointment: Yes Medication Reconciliation completed and provided to Patient/Care Yes Sandra Massey: Provided on Clinical Summary of Care: 07/07/2015 Form Type Recipient Paper Patient SO Electronic Signature(s) Signed: 07/07/2015 3:35:30 PM By: Sandra Massey Entered By: Sandra Massey on 07/07/2015 15:35:30 Sandra Massey ([id number]) -------------------------------------------------------------------------------- Lower Extremity Assessment Details Patient Name: Sandra Massey Date of Service: 07/07/2015 3:00 PM Medical Record Number: [id number] Patient Account Number: [id number] Date of Birth/Sex: 05/05/1950 (64 y.o. Female) Treating RN: Sandra Massey Primary Care Physician:  Sandra Massey Other Clinician: Referring Physician: Casilda Massey Treating Physician/Extender: Sandra Massey in Treatment: 0 Edema Assessment Assessed: [Left: No] [Right: No] E[Left: dema] [Right: :] Calf Left: Right: Point of Measurement: 28 cm From Medial Instep 42 cm cm Ankle Left: Right: Point of Measurement: 10 cm From Medial Instep 22 cm cm Vascular Assessment Claudication: Claudication Assessment [Left:None] Pulses: Popliteal Doppler: [Left:Multiphasic] Posterior Tibial Palpable: [Left:Yes] Doppler: [Left:Multiphasic] Dorsalis Pedis Palpable: [Left:Yes] Extremity colors, hair growth, and conditions: Extremity Color: [Left:Normal] Hair Growth on Extremity: [Left:Yes] Temperature of Extremity: [Left:Massey] Capillary Refill: [Left:< 3 seconds] Blood Pressure: Brachial: [Left:156] Dorsalis Pedis: 158 [Left:Dorsalis Pedis:] Ankle: Posterior Tibial: 148 [Left:Posterior Tibial: 1.01] Toe Nail Assessment Left: Right: Thick: No Sandra Massey, Sandra Massey ([id number]) Discolored: No Deformed: Yes Improper Length and Hygiene: No Electronic Signature(s) Signed: 07/07/2015 4:53:26 PM By: Sandra Cool, RN, BSN, Kim RN, BSN Entered By: Sandra Cool, RN, BSN, Sandra Massey on 07/07/2015 14:55:38 Sandra Massey ([id number]) -------------------------------------------------------------------------------- Multi Wound Chart Details Patient Name: Sandra Massey Date of Service: 07/07/2015 3:00 PM Medical Record Number: [id number] Patient Account Number: [id number] Date of Birth/Sex: May 15, 1950 (64 y.o. Female) Treating RN: Sandra Massey Primary Care Physician: Sandra Massey Other Clinician: Referring Physician: Casilda Massey Treating Physician/Extender: Sandra Massey in Treatment: 0 Vital Signs Height(in): 65 Pulse(bpm): 91 Weight(lbs): 216 Blood Pressure 156/87 (mmHg): Body Mass Index(BMI): 36 Temperature(F): 97.7 Respiratory Rate 18 (breaths/min): Photos: [1:No Photos] [N/A:N/A] Wound  Location: [1:Left Lower Leg - Anterior] [N/A:N/A] Wounding Event: [1:Bite] [N/A:N/A] Primary Etiology: [1:To be determined] [N/A:N/A] Date Acquired: [1:06/27/2015] [N/A:N/A] Weeks of Treatment: [1:0] [N/A:N/A] Wound Status: [1:Open] [N/A:N/A] Measurements L x W x D 1.3x1.6x0.1 [N/A:N/A] (cm) Area (cm) : [1:1.634] [N/A:N/A] Volume (cm) : [1:0.163] [N/A:N/A] Classification: [1:Partial Thickness] [N/A:N/A] Exudate Amount: [1:None Present] [N/A:N/A] Wound Margin: [1:Flat and Intact] [N/A:N/A] Granulation Amount: [1:None Present (0%)] [N/A:N/A] Necrotic Amount: [1:Large (67-100%)] [N/A:N/A] Necrotic Tissue: [1:Eschar] [N/A:N/A] Exposed Structures: [1:Fascia: No Fat: No Tendon: No Muscle: No Joint: No Bone: No Limited to Skin Breakdown] [N/A:N/A] Epithelialization: [1:None] [N/A:N/A] Periwound Skin Texture: No Abnormalities Noted [N/A:N/A] Periwound Skin [1:Dry/Scaly: Yes] [N/A:N/A] Moisture: Periwound Skin Color: Erythema: Yes [N/A:N/A] Erythema Location: Circumferential N/A N/A Tenderness on No N/A N/A Palpation: Wound Preparation: Ulcer Cleansing: N/A N/A Rinsed/Irrigated with Saline Topical Anesthetic Applied: Other: lidocaine 4% Treatment Notes  Electronic Signature(s) Signed: 07/07/2015 4:53:26 PM By: Sandra Cool, RN, BSN, Kim RN, BSN Entered By: Sandra Cool, RN, BSN, Sandra Massey on 07/07/2015 15:12:57 Sandra Massey ([id number]) -------------------------------------------------------------------------------- [place] Details Patient Name: Sandra Massey Date of Service: 07/07/2015 3:00 PM Medical Record Number: [id number] Patient Account Number: [id number] Date of Birth/Sex: 03-06-1951 (64 y.o. Female) Treating RN: Sandra Massey Primary Care Physician: Sandra Massey Other Clinician: Referring Physician: Casilda Massey Treating Physician/Extender: Sandra Massey in Treatment: 0 Active Inactive Abuse / Safety / Falls / Self Care Management Nursing  Diagnoses: Potential for falls Goals: Patient will remain injury free Date Initiated: 07/07/2015 Goal Status: Active Interventions: Assess fall risk on admission and as needed Notes: Nutrition Nursing Diagnoses: Impaired glucose control: actual or potential Goals: Patient/caregiver will maintain therapeutic glucose control Date Initiated: 07/07/2015 Goal Status: Active Interventions: Assess patient nutrition upon admission and as needed per policy Notes: Orientation to the Wound Care Program Nursing Diagnoses: Knowledge deficit related to the wound healing center program Goals: Patient/caregiver will verbalize understanding of the [place] Date Initiated: 07/07/2015 Sandra Massey, Sandra Massey ([id number]) Goal Status: Active Interventions: Provide education on orientation to the wound center Notes: Wound/Skin Impairment Nursing Diagnoses: Impaired tissue integrity Goals: Ulcer/skin breakdown will have a volume reduction of 30% by week 4 Date Initiated: 07/07/2015 Goal Status: Active Interventions: Assess patient/caregiver ability to obtain necessary supplies Assess ulceration(s) every visit Notes: Electronic Signature(s) Signed: 07/07/2015 4:53:26 PM By: Sandra Cool, RN, BSN, Kim RN, BSN Entered By: Sandra Cool, RN, BSN, Sandra Massey on 07/07/2015 15:12:44 Sandra Massey ([id number]) -------------------------------------------------------------------------------- Pain Assessment Details Patient Name: Sandra Massey Date of Service: 07/07/2015 3:00 PM Medical Record Number: [id number] Patient Account Number: [id number] Date of Birth/Sex: 02-Jun-1950 (64 y.o. Female) Treating RN: Sandra Massey Primary Care Physician: Sandra Massey Other Clinician: Referring Physician: Casilda Massey Treating Physician/Extender: Sandra Massey in Treatment: 0 Active Problems Location of Pain Severity and Description of Pain Patient Has Paino No Site Locations Pain Management and  Medication Current Pain Management: Electronic Signature(s) Signed: 07/07/2015 4:53:26 PM By: Sandra Cool, RN, BSN, Kim RN, BSN Entered By: Sandra Cool, RN, BSN, Sandra Massey on 07/07/2015 14:45:24 Sandra Massey ([id number]) -------------------------------------------------------------------------------- Patient/Caregiver Education Details Patient Name: Sandra Massey Date of Service: 07/07/2015 3:00 PM Medical Record Number: [id number] Patient Account Number: [id number] Date of Birth/Gender: December 13, 1950 (64 y.o. Female) Treating RN: Sandra Massey Primary Care Physician: Sandra Massey Other Clinician: Referring Physician: Casilda Massey Treating Physician/Extender: Sandra Massey in Treatment: 0 Education Assessment Education Provided To: Patient Education Topics Provided Welcome To [facility]: Handouts: Welcome To [facility] Methods: Demonstration Responses: State content correctly Wound/Skin Impairment: Handouts: Caring for Your Ulcer, Other: patient shown how to change dressing Methods: Demonstration Responses: State content correctly Electronic Signature(s) Signed: 07/07/2015 4:53:26 PM By: Sandra Cool, RN, BSN, Kim RN, BSN Entered By: Sandra Cool, RN, BSN, Sandra Massey on 07/07/2015 15:33:50 Sandra Massey ([id number]) -------------------------------------------------------------------------------- Wound Assessment Details Patient Name: Sandra Massey Date of Service: 07/07/2015 3:00 PM Medical Record Number: [id number] Patient Account Number: [id number] Date of Birth/Sex: 1950-07-16 (64 y.o. Female) Treating RN: Sandra Massey Primary Care Physician: Sandra Massey Other Clinician: Referring Physician: Casilda Massey Treating Physician/Extender: Sandra Massey in Treatment: 0 Wound Status Wound Number: 1 Primary Etiology: To be determined Wound Location: Left Lower Leg - Anterior Wound Status: Open Wounding Event: Bite Comorbid History: Hypertension, Type II Diabetes Date  Acquired: 06/27/2015 Weeks Of Treatment: 0 Clustered Wound: No Photos Wound Measurements Length: (cm) 1.3 Width: (cm) 1.6 Depth: (cm)  0.1 Area: (cm) 1.634 Volume: (cm) 0.163 % Reduction in Area: 0% % Reduction in Volume: 0% Epithelialization: None Tunneling: No Undermining: No Wound Description Classification: Partial Thickness Diabetic Severity Sandra Massey): Grade 1 Wound Margin: Flat and Intact Exudate Amount: Medium Exudate Type: Serous Exudate Color: amber Wound Bed Granulation Amount: None Present (0%) Exposed Structure Necrotic Amount: Large (67-100%) Fascia Exposed: No Necrotic Quality: Eschar Fat Layer Exposed: No Tendon Exposed: No Sandra Massey, Sandra Massey ([id number]) Muscle Exposed: No Joint Exposed: No Bone Exposed: No Limited to Skin Breakdown Periwound Skin Texture Texture Color No Abnormalities Noted: No No Abnormalities Noted: No Erythema: Yes Moisture Erythema Location: Circumferential No Abnormalities Noted: No Dry / Scaly: Yes Wound Preparation Ulcer Cleansing: Rinsed/Irrigated with Saline Topical Anesthetic Applied: Other: lidocaine 4%, Treatment Notes Wound #1 (Left, Anterior Lower Leg) 1. Cleansed with: Clean wound with Normal Saline 2. Anesthetic Topical Lidocaine 4% cream to wound bed prior to debridement 3. Peri-wound Care: Skin Prep 4. Dressing Applied: Aquacel Ag 5. Secondary Dressing Applied Bordered Foam Dressing Electronic Signature(s) Signed: 07/11/2015 1:08:01 PM By: Sandra Cool, RN, BSN, Kim RN, BSN Previous Signature: 07/10/2015 2:37:38 PM Version By: Sandra Cool RN, BSN, Kim RN, BSN Previous Signature: 07/07/2015 4:53:26 PM Version By: Sandra Cool RN, BSN, Kim RN, BSN Entered By: Sandra Cool, RN, BSN, Sandra Massey on 07/11/2015 13:08:01 Sandra Massey ([id number]) -------------------------------------------------------------------------------- [place] Details Patient Name: Sandra Massey Date of Service: 07/07/2015 3:00 PM Medical Record Number: [id number] Patient  Account Number: [id number] Date of Birth/Sex: April 27, 1950 (64 y.o. Female) Treating RN: Sandra Massey Primary Care Physician: Sandra Massey Other Clinician: Referring Physician: Casilda Massey Treating Physician/Extender: Sandra Massey in Treatment: 0 Vital Signs Time Taken: 14:45 Temperature (F): 97.7 Height (in): 65 Pulse (bpm): 91 Source: Stated Respiratory Rate (breaths/min): 18 Weight (lbs): 216 Blood Pressure (mmHg): 156/87 Source: Stated Reference Range: 80 - 120 mg / dl Body Mass Index (BMI): 35.9 Notes BP on left Electronic Signature(s) Signed: 07/07/2015 4:53:26 PM By: Sandra Cool, RN, BSN, Kim RN, BSN Entered By: Sandra Cool, RN, BSN, Sandra Massey on 07/07/2015 14:46:53

## 2015-07-08 NOTE — Progress Notes (Signed)
SIMRA, SCHUERGER ([id number]) Visit Report for 07/07/2015 Abuse/Suicide Risk Screen Details Patient Name: Sandra Massey, Sandra Massey 07/07/2015 3:00 Date of Service: PM Medical Record [id number] Number: Patient Account Number: [id number] [birth year] (65 y.o. Treating RN: Cornell Barman Date of Birth/Sex: Female) Other Clinician: Primary Care Physician: [name] Treating Britto, Errol Referring Physician: [name] Physician/Extender: Suella Grove in Treatment: 0 Abuse/Suicide Risk Screen Items Answer ABUSE/SUICIDE RISK SCREEN: Has anyone close to you tried to hurt or harm you recentlyo No Do you feel uncomfortable with anyone in your familyo No Has anyone forced you do things that you didnot want to doo No Do you have any thoughts of harming yourselfo No Patient displays signs or symptoms of abuse and/or neglect. No Electronic Signature(s) Signed: 07/07/2015 4:53:26 PM By: Gretta Cool, RN, BSN, Kim RN, BSN Entered By: Gretta Cool, RN, BSN, Kim on 07/07/2015 15:08:04 Sandra Massey ([id number]) -------------------------------------------------------------------------------- Activities of Daily Living Details Patient Name: Sandra Massey, Sandra Massey 07/07/2015 3:00 Date of Service: PM Medical Record [id number] Number: Patient Account Number: [id number] Mar 14, 1950 (65 y.o. Treating RN: Cornell Barman Date of Birth/Sex: Female) Other Clinician: Primary Care Physician: [name] Treating Christin Fudge Referring Physician: [name] Physician/Extender: Suella Grove in Treatment: 0 Activities of Daily Living Items Answer Activities of Daily Living (Please select one for each item) Drive Automobile Not Able Take Medications Need Assistance Use Telephone Need Assistance Care for Appearance Need Assistance Use Toilet Need Assistance Bath / Shower Need Assistance Dress Self Need Assistance Feed Self Need Assistance Walk Need Assistance Get In / Out Bed Need Assistance Housework Need Assistance Prepare Meals Need  Assistance Handle Money Need Assistance Shop for Self Need Assistance Electronic Signature(s) Signed: 07/07/2015 4:53:26 PM By: Gretta Cool, RN, BSN, Kim RN, BSN Entered By: Gretta Cool, RN, BSN, Kim on 07/07/2015 15:08:18 Sandra Massey ([id number]) -------------------------------------------------------------------------------- Education Assessment Details Patient Name: Sandra Massey, Sandra Massey 07/07/2015 3:00 Date of Service: PM Medical Record [id number] Number: Patient Account Number: [id number] September 02, 1950 (65 y.o. Treating RN: Cornell Barman Date of Birth/Sex: Female) Other Clinician: Primary Care Physician: [name] Treating Christin Fudge Referring Physician: [name] Physician/Extender: Suella Grove in Treatment: 0 Primary Learner Assessed: Patient Learning Preferences/Education Level/Primary Language Learning Preference: Explanation Highest Education Level: College or Above Preferred Language: English Cognitive Barrier Assessment/Beliefs Language Barrier: No Translator Needed: No Physical Barrier Assessment Impaired Vision: No Impaired Hearing: No Knowledge/Comprehension Assessment Knowledge Level: Medium Comprehension Level: Medium Ability to understand written Medium instructions: Ability to understand verbal Medium instructions: Motivation Assessment Anxiety Level: Calm Cooperation: Cooperative Education Importance: Acknowledges Need Interest in Health Problems: Asks Questions Perception: Coherent Willingness to Engage in Self- High Management Activities: Readiness to Engage in Self- High Management Activities: Electronic Signature(s) Signed: 07/07/2015 4:53:26 PM By: Gretta Cool, RN, BSN, Kim RN, BSN Entered By: Gretta Cool, RN, BSN, Kim on 07/07/2015 15:09:48 Sandra Massey ([id number], GENS ([id number]) -------------------------------------------------------------------------------- Fall Risk Assessment Details Patient Name: Sandra Massey, Sandra Massey 07/07/2015 3:00 Date of  Service: PM Medical Record [id number] Number: Patient Account Number: [id number] 11/11/1950 (65 y.o. Treating RN: Cornell Barman Date of Birth/Sex: Female) Other Clinician: Primary Care Physician: [name] Treating Britto, Errol Referring Physician: [name] Physician/Extender: Suella Grove in Treatment: 0 Fall Risk Assessment Items Have you had 2 or more falls in the last 12 monthso 0 Yes Have you had any fall that resulted in injury in the last 12 monthso 0 No FALL RISK ASSESSMENT: History of falling - immediate or within 3 months 25 Yes Secondary diagnosis 0 No Ambulatory aid None/bed rest/wheelchair/nurse 0 No Crutches/cane/walker 0 No  Furniture 0 No IV Access/Saline Lock 0 No Gait/Training Normal/bed rest/immobile 0 Yes Weak 0 No Impaired 0 No Mental Status Oriented to own ability 0 Yes Electronic Signature(s) Signed: 07/07/2015 4:53:26 PM By: Gretta Cool, RN, BSN, Kim RN, BSN Entered By: Gretta Cool, RN, BSN, Kim on 07/07/2015 15:10:53 Sandra Massey ([id number]) -------------------------------------------------------------------------------- Foot Assessment Details Patient Name: Sandra Massey, Sandra Massey 07/07/2015 3:00 Date of Service: PM Medical Record [id number] Number: Patient Account Number: [id number] Dec 28, 1950 (65 y.o. Treating RN: Cornell Barman Date of Birth/Sex: Female) Other Clinician: Primary Care Physician: [name] Treating Britto, Errol Referring Physician: [name] Physician/Extender: Suella Grove in Treatment: 0 Foot Assessment Items Site Locations + = Sensation present, - = Sensation absent, C = Callus, U = Ulcer R = Redness, W = Warmth, M = Maceration, PU = Pre-ulcerative lesion F = Fissure, S = Swelling, D = Dryness Assessment Right: Left: Other Deformity: No No Prior Foot Ulcer: No No Prior Amputation: No No Charcot Joint: No No Ambulatory Status: Ambulatory Without Help Gait: Unsteady Electronic Signature(s) Signed: 07/07/2015 4:53:26 PM By: Gretta Cool,  RN, BSN, Kim RN, BSN Entered By: Gretta Cool, RN, BSN, Kim on 07/07/2015 15:11:56 Sandra Massey ([id number]) -------------------------------------------------------------------------------- Nutrition Risk Assessment Details Patient Name: Sandra Massey, Sandra Massey 07/07/2015 3:00 Date of Service: PM Medical Record [id number] Number: Patient Account Number: [id number] May 13, 1950 (65 y.o. Treating RN: Cornell Barman Date of Birth/Sex: Female) Other Clinician: Primary Care Physician: [name] Treating Britto, Errol Referring Physician: [name] Physician/Extender: Suella Grove in Treatment: 0 Height (in): 65 Weight (lbs): 216 Body Mass Index (BMI): 35.9 Nutrition Risk Assessment Items NUTRITION RISK SCREEN: I have an illness or condition that made me change the kind and/or 0 No amount of food I eat I eat fewer than two meals per day 0 No I eat few fruits and vegetables, or milk products 0 No I have three or more drinks of beer, liquor or wine almost every day 0 No I have tooth or mouth problems that make it hard for me to eat 0 No I don't always have enough money to buy the food I need 0 No I eat alone most of the time 0 No I take three or more different prescribed or over-the-counter drugs a 0 No day Without wanting to, I have lost or gained 10 pounds in the last six 0 No months I am not always physically able to shop, cook and/or feed myself 0 No Nutrition Protocols Good Risk Protocol 0 No interventions needed Moderate Risk Protocol Electronic Signature(s) Signed: 07/07/2015 4:53:26 PM By: Gretta Cool, RN, BSN, Kim RN, BSN Entered By: Gretta Cool, RN, BSN, Kim on 07/07/2015 15:11:10

## 2015-07-10 NOTE — Progress Notes (Addendum)
KHYLEI, BARTHOLOMEW ([id number]) Visit Report for 07/07/2015 Chief Complaint Document Details Patient Name: Sandra Massey, Sandra Massey 07/07/2015 3:00 Date of Service: PM Medical Record [id number] Number: Patient Account Number: [id number] September 04, 1950 (65 y.o. Treating RN: Ahmed Prima Date of Birth/Sex: Female) Other Clinician: Primary Care Physician: [name] Treating Christin Fudge Referring Physician: [name] Physician/Extender: Suella Grove in Treatment: 0 Information Obtained from: Patient Chief Complaint Patients presents for treatment of an open diabetic ulcer to the left lower extremity which she's had for about 3 weeks Electronic Signature(s) Signed: 07/07/2015 3:31:40 PM By: Christin Fudge MD, FACS Entered By: Christin Fudge on 07/07/2015 15:31:40 Sandra Massey ([id number]) -------------------------------------------------------------------------------- Debridement Details Patient Name: Sandra Massey, Sandra Massey 07/07/2015 3:00 Date of Service: PM Medical Record [id number] Number: Patient Account Number: [id number] January 26, 1951 (65 y.o. Treating RN: Ahmed Prima Date of Birth/Sex: Female) Other Clinician: Primary Care Physician: [name] Treating Zaven Klemens Referring Physician: [name] Physician/Extender: Suella Grove in Treatment: 0 Debridement Performed for Wound #1 Left,Anterior Lower Leg Assessment: Performed By: Physician Christin Fudge, MD Debridement: Debridement Pre-procedure Yes Verification/Time Out Taken: Start Time: 15:24 Pain Control: Other : lidocaine 4% Level: Skin/Subcutaneous Tissue Total Area Debrided (L x 1.3 (cm) x 1.6 (cm) = 2.08 (cm) W): Tissue and other Viable, Non-Viable, Eschar, Exudate, Fibrin/Slough, Subcutaneous material debrided: Instrument: Forceps Bleeding: Minimum Hemostasis Achieved: Pressure End Time: 15:27 Procedural Pain: 3 Post Procedural Pain: 2 Response to Treatment: Procedure was tolerated well Post Debridement  Measurements of Total Wound Length: (cm) 1.3 Width: (cm) 1.6 Depth: (cm) 0.3 Volume: (cm) 0.49 Post Procedure Diagnosis Same as Pre-procedure Electronic Signature(s) Signed: 07/07/2015 3:31:22 PM By: Christin Fudge MD, FACS Signed: 07/10/2015 4:35:40 PM By: Alric Quan Entered By: Christin Fudge on 07/07/2015 15:31:22 Sandra Massey ([id number]) -------------------------------------------------------------------------------- HPI Details Patient Name: Sandra Massey, Sandra Massey 07/07/2015 3:00 Date of Service: PM Medical Record [id number] Number: Patient Account Number: [id number] Oct 22, 1950 (65 y.o. Treating RN: Ahmed Prima Date of Birth/Sex: Female) Other Clinician: Primary Care Physician: [name] Treating Christin Fudge Referring Physician: [name] Physician/Extender: Suella Grove in Treatment: 0 History of Present Illness Location: left anterior medial calf Quality: Patient reports experiencing a sharp pain to affected area(s). Severity: Patient states wound are getting worse. Duration: Patient has had the wound for < 3 weeks prior to presenting for treatment Timing: Pain in wound is constant (hurts all the time) Context: The wound occurred when the patient was possibly stung by an insect and had a sharp pain when she was wearing a pair of trousers. Modifying Factors: Other treatment(s) tried include:had Bactrim for a week and now is on Keflex. Associated Signs and Symptoms: Patient reports having increase discharge. HPI Description: 65 year old patient with uncontrolled diabetes, hyperlipidemia, CVA, ulcer of the left calf which has been there for about 3 weeks now. Her last hemoglobin A1c on March 20 was 7.8 and her other labs were reviewed. she has never been a smoker. Past surgical history significant for cholecystectomy and rotator cuff repair. Electronic Signature(s) Signed: 07/07/2015 3:33:02 PM By: Christin Fudge MD, FACS Previous Signature: 07/07/2015 3:20:14 PM  Version By: Christin Fudge MD, FACS Entered By: Christin Fudge on 07/07/2015 15:33:02 Sandra Massey ([id number]) -------------------------------------------------------------------------------- Physical Exam Details Patient Name: Sandra Massey, Sandra Massey 07/07/2015 3:00 Date of Service: PM Medical Record [id number] Number: Patient Account Number: [id number] [birth year] (65 y.o. Treating RN: Ahmed Prima Date of Birth/Sex: Female) Other Clinician: Primary Care Physician: [name] Treating Christin Fudge Referring Physician: [name] Physician/Extender: Weeks in Treatment: 0 Constitutional . Pulse regular. Respirations normal and  unlabored. Afebrile. . Eyes Nonicteric. Reactive to light. Ears, Nose, Mouth, and Throat Lips, teeth, and gums WNL.Marland Kitchen Moist mucosa without lesions. Neck supple and nontender. No palpable supraclavicular or cervical adenopathy. Normal sized without goiter. Respiratory WNL. No retractions.. Cardiovascular Pedal Pulses WNL. left ABI was 1.01. No clubbing, cyanosis or edema. Gastrointestinal (GI) Abdomen without masses or tenderness.. No liver or spleen enlargement or tenderness.. Lymphatic No adneopathy. No adenopathy. No adenopathy. Musculoskeletal Adexa without tenderness or enlargement.. Digits and nails w/o clubbing, cyanosis, infection, petechiae, ischemia, or inflammatory conditions.. Integumentary (Hair, Skin) No suspicious lesions. No crepitus or fluctuance. No peri-wound warmth or erythema. No masses.Marland Kitchen Psychiatric Judgement and insight Intact.. No evidence of depression, anxiety, or agitation.. Notes she has a circular wound on the left mid anterior medial calf with some eschar which was sharply debrided with a forcep and down to the subcutaneous tissue all the necrotic debris was removed and washed out with saline and bleeding controlled with pressure Electronic Signature(s) Signed: 07/07/2015 3:34:11 PM By: Christin Fudge MD, FACS Entered  By: Christin Fudge on 07/07/2015 15:34:11 Sandra Massey ([id number]) -------------------------------------------------------------------------------- Physician Orders Details Patient Name: Sandra Massey, Sandra Massey 07/07/2015 3:00 Date of Service: PM Medical Record [id number] Number: Patient Account Number: [id number] [birth year] (65 y.o. Treating RN: Cornell Barman Date of Birth/Sex: Female) Other Clinician: Primary Care Physician: [name] Treating Christin Fudge Referring Physician: [name] Physician/Extender: Suella Grove in Treatment: 0 Verbal / Phone Orders: Yes Clinician: Cornell Barman Read Back and Verified: No Diagnosis Coding Wound Cleansing Wound #1 Left,Anterior Lower Leg o Clean wound with Normal Saline. o Cleanse wound with mild soap and water Anesthetic Wound #1 Left,Anterior Lower Leg o Topical Lidocaine 4% cream applied to wound bed prior to debridement Primary Wound Dressing Wound #1 Left,Anterior Lower Leg o Aquacel Ag Secondary Dressing Wound #1 Left,Anterior Lower Leg o Boardered Foam Dressing Dressing Change Frequency Wound #1 Left,Anterior Lower Leg o Change dressing every other day. Follow-up Appointments Wound #1 Left,Anterior Lower Leg o Return Appointment in 1 week. Services and Therapies o DME provider, dressing supplies - Aquacel AG, Bordered Foam Dressing, Skin Prep Electronic Signature(s) Signed: 07/07/2015 4:23:46 PM By: Christin Fudge MD, FACS Signed: 07/07/2015 4:53:26 PM By: Gretta Cool RN, BSN, Kim RN, BSN [address], [place] ([id number]) Entered By: Gretta Cool, RN, BSN, Kim on 07/07/2015 15:30:25 Sandra Massey ([id number]) -------------------------------------------------------------------------------- Problem List Details Patient Name: Sandra Massey, Sandra Massey 07/07/2015 3:00 Date of Service: PM Medical Record [id number] Number: Patient Account Number: [id number] [birth year] (65 y.o. 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 Treating RN: Ahmed Prima Date of Birth/Sex: Female) Other Clinician: Primary Care Physician: [name] Treating Christin Fudge Referring Physician: [name] Physician/Extender: Suella Grove in Treatment: 0 Subjective Chief Complaint Information obtained from Patient Patients presents for treatment of an open diabetic ulcer to the left  lower extremity which she's had for about 3 weeks History of Present Illness (HPI) The following HPI elements were documented for the patient's wound: Location: left anterior medial calf Quality: Patient reports experiencing a sharp pain to affected area(s). Severity: Patient states wound are getting worse. Duration: Patient has had the wound for < 3 weeks prior to presenting for treatment Timing: Pain in wound is constant (hurts all the time) Context: The wound occurred when the patient was possibly stung by an insect and had a sharp pain when she was wearing a pair of trousers. Modifying Factors: Other  treatment(s) tried include:had Bactrim for a week and now is on Keflex. Associated Signs and Symptoms: Patient reports having increase discharge. 65 year old patient with uncontrolled diabetes, hyperlipidemia, CVA, ulcer of the left calf which has been there for about 3 weeks now. Her last hemoglobin A1c on March 20 was 7.8 and her other labs were reviewed. she has never been a smoker. Past surgical history significant for cholecystectomy and rotator cuff repair. Wound History Patient presents with 1 open wound that has been present for approximately 2 weeks. Patient has been treating wound in the following manner: abx. Laboratory tests have been performed in the last month. Patient reportedly has tested positive for an antibiotic resistant organism. Patient reportedly has not tested positive for osteomyelitis. Patient reportedly has not had testing performed to evaluate circulation in the legs. Patient experiences the following problems associated with their wounds: infection. Patient History Allergies adhesive tape Sandra Massey, Sandra Massey ([id number]) Family History Cancer - Father, Diabetes - Mother, Heart Disease - Siblings, Mother, Hypertension - Mother, Siblings, Seizures - Siblings, Stroke - Mother, Thyroid Problems - Mother, No family history of Kidney Disease, Lung Disease. Social History Former smoker, Marital Status - Single, Alcohol Use - Never, Drug Use - No History, Caffeine Use - Daily - diet Mt Dew. Medical History Eyes Denies history of Cataracts, Glaucoma, Optic Neuritis Ear/Nose/Mouth/Throat Denies history of Chronic sinus problems/congestion, Middle ear problems Hematologic/Lymphatic Denies history of Anemia, Hemophilia, Human Immunodeficiency Virus, Lymphedema, Sickle Cell Disease Respiratory Denies history of Aspiration, Asthma, Chronic Obstructive Pulmonary Disease (COPD), Pneumothorax, Sleep Apnea, Tuberculosis Cardiovascular Patient has history of  Hypertension Denies history of Angina, Arrhythmia, Congestive Heart Failure, Coronary Artery Disease, Deep Vein Thrombosis, Hypotension, Myocardial Infarction, Peripheral Arterial Disease, Peripheral Venous Disease, Phlebitis, Vasculitis Gastrointestinal Denies history of Cirrhosis , Colitis, Crohn s, Hepatitis A, Hepatitis B, Hepatitis C Endocrine Patient has history of Type II Diabetes - A1C 7.8 Denies history of Type I Diabetes Genitourinary Denies history of End Stage Renal Disease Immunological Denies history of Lupus Erythematosus, Raynaud s, Scleroderma Integumentary (Skin) Denies history of History of Burn, History of pressure wounds Musculoskeletal Denies history of Gout, Rheumatoid Arthritis, Osteoarthritis, Osteomyelitis Neurologic Denies history of Dementia, Neuropathy, Quadriplegia, Paraplegia, Seizure Disorder Oncologic Denies history of Received Chemotherapy, Received Radiation Psychiatric Denies history of Anorexia/bulimia, Confinement Anxiety Patient is treated with Oral Agents. Blood sugar is tested. Blood sugar results noted at the following times: Breakfast - 181. Medical And Surgical History Notes Constitutional Symptoms [facility]) stroke, Sandra Massey, Sandra Massey ([id number]) Review of Systems (ROS) Constitutional Symptoms (General Health) The patient has no complaints or symptoms. Eyes The patient has no complaints or symptoms. Ear/Nose/Mouth/Throat The patient has no complaints or symptoms. Hematologic/Lymphatic The patient has no complaints or symptoms. Respiratory The patient has no complaints or symptoms. Cardiovascular Complains or has symptoms of LE edema. Denies complaints or symptoms of Chest pain. Gastrointestinal The patient has no complaints or symptoms. Endocrine  The patient has no complaints or symptoms. Genitourinary The patient has no complaints or symptoms. Immunological The patient has no complaints or symptoms. Integumentary  (Skin) Complains or has symptoms of Wounds, Bleeding or bruising tendency. Denies complaints or symptoms of Breakdown, Swelling. Musculoskeletal The patient has no complaints or symptoms. Neurologic Complains or has symptoms of Focal/Weakness - right side from stroke. Denies complaints or symptoms of Numbness/parasthesias. Oncologic The patient has no complaints or symptoms. Psychiatric The patient has no complaints or symptoms. Medications lisinopril 10 mg-hydrochlorothiazide 12.5 mg tablet oral tablet oral aspirin 325 mg tablet oral tablet oral aspirin 325 mg tablet oral tablet oral metformin 1,000 mg tablet oral tablet oral Onglyza 5 mg tablet oral tablet oral simvastatin 40 mg tablet oral tablet oral Calcium 600 600 mg calcium (1,500 mg) tablet oral tablet oral cephalexin 500 mg capsule oral capsule oral Lexapro 10 mg tablet oral tablet oral cholecalciferol (vitamin D3) 1,000 unit capsule oral capsule oral AERIN, LAVANWAY. ([id number]) Objective Constitutional Pulse regular. Respirations normal and unlabored. Afebrile. Vitals Time Taken: 2:45 PM, Height: 65 in, Source: Stated, Weight: 216 lbs, Source: Stated, BMI: 35.9, Temperature: 97.7 F, Pulse: 91 bpm, Respiratory Rate: 18 breaths/min, Blood Pressure: 156/87 mmHg. General Notes: BP on left Eyes Nonicteric. Reactive to light. Ears, Nose, Mouth, and Throat Lips, teeth, and gums WNL.Marland Kitchen Moist mucosa without lesions. Neck supple and nontender. No palpable supraclavicular or cervical adenopathy. Normal sized without goiter. Respiratory WNL. No retractions.. Cardiovascular Pedal Pulses WNL. left ABI was 1.01. No clubbing, cyanosis or edema. Gastrointestinal (GI) Abdomen without masses or tenderness.. No liver or spleen enlargement or tenderness.. Lymphatic No adneopathy. No adenopathy. No adenopathy. Musculoskeletal Adexa without tenderness or enlargement.. Digits and nails w/o clubbing, cyanosis, infection,  petechiae, ischemia, or inflammatory conditions.Marland Kitchen Psychiatric Judgement and insight Intact.. No evidence of depression, anxiety, or agitation.. General Notes: she has a circular wound on the left mid anterior medial calf with some eschar which was sharply debrided with a forcep and down to the subcutaneous tissue all the necrotic debris was removed and washed out with saline and bleeding controlled with pressure Integumentary (Hair, Skin) No suspicious lesions. No crepitus or fluctuance. No peri-wound warmth or erythema. No masses.. Wound #1 status is Open. Original cause of wound was Bite. The wound is located on the [place], [place]. ([id number]) Lower Leg. The wound measures 1.3cm length x 1.6cm width x 0.1cm depth; 1.634cm^2 area and 0.163cm^3 volume. The wound is limited to skin breakdown. There is no tunneling or undermining noted. There is a medium amount of serous drainage noted. The wound margin is flat and intact. There is no granulation within the wound bed. There is a large (67-100%) amount of necrotic tissue within the wound bed including Eschar. The periwound skin appearance exhibited: Dry/Scaly, Erythema. The surrounding wound skin color is noted with erythema which is circumferential. Assessment Active Problems ICD-10 E11.622 - Type 2 diabetes mellitus with other skin ulcer L97.222 - Non-pressure chronic ulcer of left calf with fat layer exposed W57.Merril Abbe - Bitten or stung by nonvenomous insect and other nonvenomous arthropods, initial encounter this 65 year old diabetic use had a stroke in the past and has a right-sided CVA was possibly bitten by a insect about 3 weeks ago and has a necrotic ulcer with subcutaneous debris which has been debrided appropriately. I have recommended: 1. Aquacel Ag and a bordered foam to be changed every other day 2. Continue her antibiotic as prescribed by her PCP 3. Regular visits to the wound center  She had a sister having had  all questions answered and will be compliant Procedures Wound #1 Wound #1 is a To be determined located on the Left,Anterior Lower Leg . There was a Skin/Subcutaneous Tissue Debridement [id number]) debridement with total area of 2.08 sq cm performed by Christin Fudge, MD. with the following instrument(s): Forceps to remove Viable and Non-Viable tissue/material including Exudate, Fibrin/Slough, Eschar, and Subcutaneous after achieving pain control using Other (lidocaine 4%). A time out was conducted prior to the start of the procedure. A Minimum amount of bleeding was controlled with Pressure. The procedure was tolerated well with a pain level of 3 throughout and a pain level of 2 following the procedure. Post Debridement Measurements: 1.3cm length x 1.6cm width x 0.3cm depth; 0.49cm^3 volume. Post procedure Diagnosis Wound #1: Same as Pre-Procedure Sandra Massey, Sandra Massey ([id number]) Plan Wound Cleansing: Wound #1 Left,Anterior Lower Leg: Clean wound with Normal Saline. Cleanse wound with mild soap and water Anesthetic: Wound #1 Left,Anterior Lower Leg: Topical Lidocaine 4% cream applied to wound bed prior to debridement Primary Wound Dressing: Wound #1 Left,Anterior Lower Leg: Aquacel Ag Secondary Dressing: Wound #1 Left,Anterior Lower Leg: Boardered Foam Dressing Dressing Change Frequency: Wound #1 Left,Anterior Lower Leg: Change dressing every other day. Follow-up Appointments: Wound #1 Left,Anterior Lower Leg: Return Appointment in 1 week. Services and Therapies ordered were: DME provider, dressing supplies - Aquacel AG, Bordered Foam Dressing, Skin Prep this 65 year old diabetic use had a stroke in the past and has a right-sided CVA was possibly bitten by a insect about 3 weeks ago and has a necrotic ulcer with subcutaneous debris which has been debrided appropriately. I have recommended: 1. Aquacel Ag and a bordered foam to be changed every other day 2. Continue her antibiotic  as prescribed by her PCP 3. Regular visits to the wound center She had a sister having had all questions answered and will be compliant Electronic Signature(s) Sandra Massey, OLIVAREZ ([id number]) Signed: 07/13/2015 4:09:59 PM By: Christin Fudge MD, FACS Previous Signature: 07/10/2015 2:54:05 PM Version By: Christin Fudge MD, FACS Previous Signature: 07/07/2015 3:35:24 PM Version By: Christin Fudge MD, FACS Entered By: Christin Fudge on 07/13/2015 16:09:58 Sandra Massey ([id number]) -------------------------------------------------------------------------------- ROS/PFSH Details Patient Name: Sandra Massey, Sandra Massey 07/07/2015 3:00 Date of Service: PM Medical Record [id number] Number: Patient Account Number: [id number] Jan 25, 1951 (65 y.o. Treating RN: Cornell Barman Date of Birth/Sex: Female) Other Clinician: Primary Care Physician: [name] Treating Luc Shammas Referring Physician: [name] Physician/Extender: Suella Grove in Treatment: 0 Wound History Do you currently have one or more open woundso Yes How many open wounds do you currently haveo 1 Approximately how long have you had your woundso 2 weeks How have you been treating your wound(s) until nowo abx Has your wound(s) ever healed and then re-openedo No Have you had any lab work done in the past montho Yes Have you tested positive for an antibiotic resistant organism (MRSA, VRE)o Yes Have you tested positive for osteomyelitis (bone infection)o No Have you had any tests for circulation on your legso No Have you had other problems associated with your woundso Infection Constitutional Symptoms (General Health) Complaints and Symptoms: No Complaints or Symptoms Complaints and Symptoms: Negative for: Fatigue; Fever; Chills; Marked Weight Change Medical History: Past Medical History Notes: stroke, Cardiovascular Complaints and Symptoms: Positive for: LE edema Negative for: Chest pain Medical History: Positive for: Hypertension Negative for:  Angina; Arrhythmia; Congestive Heart Failure; Coronary Artery Disease; Deep Vein Thrombosis; Hypotension; Myocardial Infarction; Peripheral Arterial Disease; Peripheral Venous Disease; Phlebitis;  Vasculitis Integumentary (Skin) Complaints and Symptoms: Positive for: Wounds; Bleeding or bruising tendency Negative for: Breakdown; Swelling VIDUSHI, SABEY ([id number]) Medical History: Negative for: History of Burn; History of pressure wounds Neurologic Complaints and Symptoms: Positive for: Focal/Weakness - right side from stroke Negative for: Numbness/parasthesias Medical History: Negative for: Dementia; Neuropathy; Quadriplegia; Paraplegia; Seizure Disorder Eyes Complaints and Symptoms: No Complaints or Symptoms Medical History: Negative for: Cataracts; Glaucoma; Optic Neuritis Ear/Nose/Mouth/Throat Complaints and Symptoms: No Complaints or Symptoms Medical History: Negative for: Chronic sinus problems/congestion; Middle ear problems Hematologic/Lymphatic Complaints and Symptoms: No Complaints or Symptoms Medical History: Negative for: Anemia; Hemophilia; Human Immunodeficiency Virus; Lymphedema; Sickle Cell Disease Respiratory Complaints and Symptoms: No Complaints or Symptoms Medical History: Negative for: Aspiration; Asthma; Chronic Obstructive Pulmonary Disease (COPD); Pneumothorax; Sleep Apnea; Tuberculosis Gastrointestinal Complaints and Symptoms: No Complaints or Symptoms Medical HistoryMOSIE, OLTHOFF ([id number]) Negative for: Cirrhosis ; Colitis; Crohnos; Hepatitis A; Hepatitis B; Hepatitis C Endocrine Complaints and Symptoms: No Complaints or Symptoms Medical History: Positive for: Type II Diabetes - A1C 7.8 Negative for: Type I Diabetes Time with diabetes: 5 Treated with: Oral agents Blood sugar tested every day: Yes Tested : daily Blood sugar testing results: Breakfast: 181 Genitourinary Complaints and Symptoms: No Complaints or Symptoms Medical  History: Negative for: End Stage Renal Disease Immunological Complaints and Symptoms: No Complaints or Symptoms Medical History: Negative for: Lupus Erythematosus; Raynaudos; Scleroderma Musculoskeletal Complaints and Symptoms: No Complaints or Symptoms Medical History: Negative for: Gout; Rheumatoid Arthritis; Osteoarthritis; Osteomyelitis Oncologic Complaints and Symptoms: No Complaints or Symptoms Medical History: Negative for: Received Chemotherapy; Received Radiation Psychiatric BRAIDYN, LINEBACK ([id number]) Complaints and Symptoms: No Complaints or Symptoms Medical History: Negative for: Anorexia/bulimia; Confinement Anxiety Family and Social History Cancer: Yes - Father; Diabetes: Yes - Mother; Heart Disease: Yes - Siblings, Mother; Hypertension: Yes - Mother, Siblings; Kidney Disease: No; Lung Disease: No; Seizures: Yes - Siblings; Stroke: Yes - Mother; Thyroid Problems: Yes - Mother; Former smoker; Marital Status - Single; Alcohol Use: Never; Drug Use: No History; Caffeine Use: Daily - diet Mt Dew; Advanced Directives: Yes (Not Provided); Patient does not want information on Advanced Directives; Living Will: Yes (Not Provided) Physician Affirmation I have reviewed and agree with the above information. Electronic Signature(s) Signed: 07/07/2015 3:11:45 PM By: Christin Fudge MD, FACS Signed: 07/07/2015 4:53:26 PM By: Gretta Cool RN, BSN, Kim RN, BSN Entered By: Christin Fudge on 07/07/2015 15:11:45 Sandra Massey ([id number]) -------------------------------------------------------------------------------- SuperBill Details Patient Name: Sandra Massey Date of Service: 07/07/2015 Medical Record Number: [id number] Patient Account Number: [id number] Date of Birth/Sex: 1951/02/11 (65 y.o. 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 Bitten or stung by nonvenomous insect and other nonvenomous arthropods, initial W57.Merril Abbe encounter Facility Procedures CPT4: Description Modifier Quantity Code [id number] - WOUND CARE VISIT-LEV 2 EST PT 1 CPT4: [id number] - DEB SUBQ TISSUE 20 SQ CM/< 1 ICD-10 Description Diagnosis E11.622 Type 2 diabetes mellitus 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 W57.Merril Abbe Bitten or stung by nonvenomous insect  and other nonvenomous arthropods, initial encounter Physician Procedures CPT4: Description Modifier Quantity Code [id number] - WC PHYS LEVEL 4 - NEW PT 25 1 ICD-10 Description Diagnosis E11.622 Type 2 diabetes mellitus with other  skin ulcer L97.222 Non-pressure chronic ulcer of left calf with fat layer exposed W57.XXXA  Bitten or stung by nonvenomous insect and other nonvenomous arthropods, initial encounter CPT4: [id number] - WC PHYS SUBQ TISS 20 SQ CM 1 ICD-10 Description Diagnosis E11.622 Type 2 diabetes mellitus with other skin ulcer ANDORA, GOODENOW ([id number]) Electronic Signature(s) Signed: 07/07/2015 3:35:47 PM By: Christin Fudge MD, FACS Entered By: Christin Fudge on 07/07/2015 15:35:47

## 2015-07-14 ENCOUNTER — Encounter: Payer: Medicare HMO | Attending: Surgery | Admitting: Surgery

## 2015-07-14 DIAGNOSIS — E785 Hyperlipidemia, unspecified: Secondary | ICD-10-CM | POA: Insufficient documentation

## 2015-07-14 DIAGNOSIS — Z8673 Personal history of transient ischemic attack (TIA), and cerebral infarction without residual deficits: Secondary | ICD-10-CM | POA: Insufficient documentation

## 2015-07-14 DIAGNOSIS — L97222 Non-pressure chronic ulcer of left calf with fat layer exposed: Secondary | ICD-10-CM | POA: Insufficient documentation

## 2015-07-14 DIAGNOSIS — E11622 Type 2 diabetes mellitus with other skin ulcer: Secondary | ICD-10-CM | POA: Diagnosis not present

## 2015-07-14 DIAGNOSIS — W57XXXA Bitten or stung by nonvenomous insect and other nonvenomous arthropods, initial encounter: Secondary | ICD-10-CM | POA: Insufficient documentation

## 2015-07-14 NOTE — Progress Notes (Addendum)
VADA, LARTIGUE ([id number]) Visit Report for 07/14/2015 Chief Complaint Document Details Patient Name: Sandra Massey, Sandra Massey Date of Service: 07/14/2015 3:00 PM Medical Record Number: [id number] Patient Account Number: [id number] Date of Birth/Sex: 22-Jul-1950 (65 y.o. Female) Treating RN: Carolyne Fiscal, Debi Primary Care Physician: [name] Other Clinician: Referring Physician: [name] Treating Physician/Extender: Frann Rider in Treatment: 1 Information Obtained from: Patient Chief Complaint Patients presents for treatment of an open diabetic ulcer to the left lower extremity which she's had for about 3 weeks Electronic Signature(s) Signed: 07/14/2015 3:01:24 PM By: Christin Fudge MD, FACS Entered By: Christin Fudge on 07/14/2015 15:01:24 Sandra Massey ([id number]) -------------------------------------------------------------------------------- HPI Details Patient Name: Sandra Massey Date of Service: 07/14/2015 3:00 PM Medical Record Number: [id number] Patient Account Number: [id number] Date of Birth/Sex: 1950-07-20 (65 y.o. Female) Treating RN: Carolyne Fiscal, Debi Primary Care Physician: [name] Other Clinician: Referring Physician: [name] Treating Physician/Extender: Frann Rider in Treatment: 1 History of Present Illness Location: left anterior medial calf Quality: Patient reports experiencing a sharp pain to affected area(s). Severity: Patient states wound are getting worse. Duration: Patient has had the wound for < 3 weeks prior to presenting for treatment Timing: Pain in wound is constant (hurts all the time) Context: The wound occurred when the patient was possibly stung by an insect and had a sharp pain when she was wearing a pair of trousers. Modifying Factors: Other treatment(s) tried include:had Bactrim for a week and now is on Keflex. Associated Signs and Symptoms: Patient reports having increase discharge. HPI Description: 65 year old patient  with uncontrolled diabetes, hyperlipidemia, CVA, ulcer of the left calf which has been there for about 3 weeks now. Her last hemoglobin A1c on March 20 was 7.8 and her other labs were reviewed. she has never been a smoker. Past surgical history significant for cholecystectomy and rotator cuff repair. Electronic Signature(s) Signed: 07/14/2015 3:01:29 PM By: Christin Fudge MD, FACS Entered By: Christin Fudge on 07/14/2015 15:01:29 Sandra Massey ([id number]) -------------------------------------------------------------------------------- Physical Exam Details Patient Name: Sandra Massey Date of Service: 07/14/2015 3:00 PM Medical Record Number: [id number] Patient Account Number: [id number] Date of Birth/Sex: November 06, 1950 (65 y.o. Female) Treating RN: Carolyne Fiscal, Debi Primary Care Physician: [name] Other Clinician: Referring Physician: [name] Treating Physician/Extender: Frann Rider in Treatment: 1 Constitutional . Pulse regular. Respirations normal and unlabored. Afebrile. . Eyes Nonicteric. Reactive to light. Ears, Nose, Mouth, and Throat Lips, teeth, and gums WNL.Marland Kitchen Moist mucosa without lesions. Neck supple and nontender. No palpable supraclavicular or cervical adenopathy. Normal sized without goiter. Respiratory WNL. No retractions.. Cardiovascular Pedal Pulses WNL. No clubbing, cyanosis or edema. Lymphatic No adneopathy. No adenopathy. No adenopathy. Musculoskeletal Adexa without tenderness or enlargement.. Digits and nails w/o clubbing, cyanosis, infection, petechiae, ischemia, or inflammatory conditions.. Integumentary (Hair, Skin) No suspicious lesions. No crepitus or fluctuance. No peri-wound warmth or erythema. No masses.Marland Kitchen Psychiatric Judgement and insight Intact.. No evidence of depression, anxiety, or agitation.. Notes the wound is looking very good and there is no debridement required today. She has healthy granulation tissue. Electronic  Signature(s) Signed: 07/14/2015 3:01:58 PM By: Christin Fudge MD, FACS Entered By: Christin Fudge on 07/14/2015 15:01:58 Sandra Massey ([id number]) -------------------------------------------------------------------------------- Physician Orders Details Patient Name: Sandra Massey Date of Service: 07/14/2015 3:00 PM Medical Record Number: [id number] Patient Account Number: [id number] Date of Birth/Sex: 06-05-1950 (65 y.o. 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 o Cleanse wound with mild soap and water Anesthetic Wound #1 Left,Anterior Lower Leg o Topical Lidocaine 4% cream applied to wound bed prior to debridement Primary Wound Dressing Wound #1 Left,Anterior Lower Leg o Prisma Ag - moisten with saline Secondary Dressing Wound #1 Left,Anterior Lower Leg o Boardered Foam Dressing Dressing Change Frequency Wound #1 Left,Anterior Lower Leg o Change dressing every other day. Follow-up Appointments Wound #1 Left,Anterior Lower Leg o Return Appointment in 1 week. Electronic Signature(s) LAVAEH, CALLO ([id number]) Signed: 07/14/2015 4:31:56 PM By: Christin Fudge MD, FACS Signed: 07/14/2015 5:08:36 PM By: Alric Quan Entered By: Alric Quan on 07/14/2015 15:00:31 Sandra Massey ([id number]) -------------------------------------------------------------------------------- Problem  List Details Patient Name: Sandra Massey Date of Service: 07/14/2015 3:00 PM Medical Record Number: [id number] Patient Account Number: [id number] Date of Birth/Sex: 1950-03-28 (65 y.o. Female) Treating RN: Carolyne Fiscal, Debi Primary Care Physician: [name] Other Clinician: Referring Physician: [name] Treating Physician/Extender: Frann Rider in Treatment: 1 Active Problems ICD-10 Encounter Code Description Active Date Diagnosis E11.622 Type 2 diabetes mellitus with other skin ulcer 07/07/2015 Yes L97.222 Non-pressure chronic ulcer of left calf with fat layer 07/07/2015 Yes exposed W57.XXXA Bitten or stung by nonvenomous insect and other 07/07/2015 Yes nonvenomous arthropods, initial encounter Inactive Problems Resolved Problems Electronic Signature(s) Signed: 07/14/2015 2:55:06 PM By: Christin Fudge MD, FACS Entered By: Christin Fudge on 07/14/2015 14:55:05 Sandra Massey ([id number]) -------------------------------------------------------------------------------- Progress Note Details Patient Name: Sandra Massey Date of Service: 07/14/2015 3:00 PM Medical Record Number: [id number] Patient Account Number: [id number] Date of Birth/Sex: 12-22-1950 (65 y.o. Female) Treating RN: Carolyne Fiscal, Debi Primary Care Physician: [name] Other Clinician: Referring Physician: [name] Treating Physician/Extender: Frann Rider in Treatment: 1 Subjective Chief Complaint Information obtained from Patient Patients presents for treatment of an open diabetic ulcer to the left lower extremity which she's had for about 3 weeks History of Present Illness (HPI) The following HPI elements were documented for the patient's wound: Location: left anterior medial calf Quality: Patient reports experiencing a sharp pain to affected area(s). Severity: Patient states wound are getting worse. Duration: Patient has had the wound for < 3 weeks prior to presenting for  treatment Timing: Pain in wound is constant (hurts all the time) Context: The wound occurred when the patient was possibly stung by an insect and had a sharp pain when she was wearing a pair of trousers. Modifying Factors: Other treatment(s) tried include:had Bactrim for a week and now is on Keflex. Associated Signs and Symptoms: Patient reports having increase discharge. 65 year old patient with uncontrolled diabetes, hyperlipidemia, CVA, ulcer of the left calf which has been there for about 3 weeks now. Her last hemoglobin A1c on March 20 was 7.8 and her other labs were reviewed. she has never been a smoker. Past surgical history significant for cholecystectomy and rotator cuff repair. Objective Constitutional Pulse regular. Respirations normal and unlabored. Afebrile. Vitals Time Taken: 2:44 PM, Height: 65 in, Weight: 216 lbs, BMI: 35.9, Pulse: 85 bpm, Respiratory Rate: 18 breaths/min, Blood Pressure: 158/84 mmHg. Eyes NOVALYN, FLOTT ([id number]) Nonicteric. Reactive to light. Ears, Nose, Mouth, and Throat Lips, teeth, and gums  WNL.. Moist mucosa without lesions. Neck supple and nontender. No palpable supraclavicular or cervical adenopathy. Normal sized without goiter. Respiratory WNL. No retractions.. Cardiovascular Pedal Pulses WNL. No clubbing, cyanosis or edema. Lymphatic No adneopathy. No adenopathy. No adenopathy. Musculoskeletal Adexa without tenderness or enlargement.. Digits and nails w/o clubbing, cyanosis, infection, petechiae, ischemia, or inflammatory conditions.Marland Kitchen Psychiatric Judgement and insight Intact.. No evidence of depression, anxiety, or agitation.. General Notes: the wound is looking very good and there is no debridement required today. She has healthy granulation tissue. Integumentary (Hair, Skin) No suspicious lesions. No crepitus or fluctuance. No peri-wound warmth or erythema. No masses.. Wound #1 status is Open. Original cause of wound was Bite. The  wound is located on the Left,Anterior Lower Leg. The wound measures 0.9cm length x 1.8cm width x 0.1cm depth; 1.272cm^2 area and 0.127cm^3 volume. The wound is limited to skin breakdown. There is no tunneling or undermining noted. There is a large amount of serous drainage noted. The wound margin is flat and intact. There is medium (34-66%) granulation within the wound bed. There is a medium (34-66%) amount of necrotic tissue within the wound bed including Adherent Slough. The periwound skin appearance exhibited: Moist, Erythema. The periwound skin appearance did not exhibit: Dry/Scaly. The surrounding wound skin color is noted with erythema which is circumferential. Periwound temperature was noted as No Abnormality. The periwound has tenderness on palpation. Assessment Active Problems ICD-10 E11.622 - Type 2 diabetes mellitus with other skin ulcer BRITNEI, KOSMICKI ([id number]) [phone] - Non-pressure chronic ulcer of left calf with fat layer exposed W57.Merril Abbe - Bitten or stung by nonvenomous insect and other nonvenomous arthropods, initial encounter The wound is very clean today and I have recommended we change over to [facility] and use a bordered foam. she will come back to see as next week. Plan Wound Cleansing: Wound #1 Left,Anterior Lower Leg: Clean wound with Normal Saline. Cleanse wound with mild soap and water Anesthetic: Wound #1 Left,Anterior Lower Leg: Topical Lidocaine 4% cream applied to wound bed prior to debridement Primary Wound Dressing: Wound #1 Left,Anterior Lower Leg: Prisma Ag - moisten with saline Secondary Dressing: Wound #1 Left,Anterior Lower Leg: Boardered Foam Dressing Dressing Change Frequency: Wound #1 Left,Anterior Lower Leg: Change dressing every other day. Follow-up Appointments: Wound #1 Left,Anterior Lower Leg: Return Appointment in 1 week. The wound is very clean today and I have recommended we change over to [facility] and use a  bordered foam. she will come back to see as next week. Electronic Signature(s) Signed: 07/14/2015 3:02:34 PM By: Christin Fudge MD, FACS JESSICCA, FILIPPONE ([id number]) Entered By: Christin Fudge on 07/14/2015 15:02:33 Sandra Massey ([id number]) -------------------------------------------------------------------------------- SuperBill Details Patient Name: Sandra Massey Date of Service: 07/14/2015 Medical Record Number: [id number] Patient Account Number: [id number] Date of Birth/Sex: [birth year] (65 y.o. 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

## 2015-07-15 NOTE — Progress Notes (Signed)
[name], [name] ([id number]) Visit Report for 07/14/2015 Arrival Information Details Patient Name: Sandra Massey, Sandra Massey Date of Service: 07/14/2015 3:00 PM Medical Record Number: [id number] Patient Account Number: [id number] Date of Birth/Sex: 10-17-1950 (65 y.o. Female) Treating RN: Carolyne Fiscal, Debi Primary Care Physician: [name] Other Clinician: Referring Physician: [name] Treating Physician/Extender: Frann Rider in Treatment: 1 Visit Information History Since Last Visit All ordered tests and consults were completed: No Patient Arrived: Ambulatory Added or deleted any medications: No Arrival Time: 14:43 Any new allergies or adverse reactions: No Accompanied By: sister Had a fall or experienced change in No Transfer Assistance: None activities of daily living that may affect Patient Identification Verified: Yes risk of falls: Secondary Verification Process Yes Signs or symptoms of abuse/neglect since last No Completed: visito Patient Requires Transmission- No Hospitalized since last visit: No Based Precautions: Pain Present Now: No Patient Has Alerts: Yes Patient Alerts: Patient on Blood Thinner ASA Electronic Signature(s) Signed: 07/14/2015 5:08:36 PM By: Alric Quan Entered By: Alric Quan on 07/14/2015 14:44:15 Sandra Massey ([id number]) -------------------------------------------------------------------------------- Encounter Discharge Information Details Patient Name: Sandra Massey Date of Service: 07/14/2015 3:00 PM Medical Record Number: [id number] Patient Account Number: [id number] Date of Birth/Sex: 07-08-1950 (65 y.o. Female) Treating RN: Carolyne Fiscal, Debi Primary Care Physician: [name] Other Clinician: Referring Physician: [name] Treating Physician/Extender: Frann Rider in Treatment: 1 Encounter Discharge Information Items Discharge Pain Level: 0 Discharge Condition: Stable Ambulatory Status:  Ambulatory Discharge Destination: Home Transportation: Private Auto Accompanied By: sister Schedule Follow-up Appointment: Yes Medication Reconciliation completed and provided to Patient/Care Yes Anthone Prieur: Provided on Clinical Summary of Care: 07/14/2015 Form Type Recipient Paper Patient SO Electronic Signature(s) Signed: 07/14/2015 3:09:45 PM By: Ruthine Dose Entered By: Ruthine Dose on 07/14/2015 15:09:45 Sandra Massey ([id number]) -------------------------------------------------------------------------------- Lower Extremity Assessment Details Patient Name: Sandra Massey Date of Service: 07/14/2015 3:00 PM Medical Record Number: [id number] Patient Account Number: [id number] Date of Birth/Sex: 1950/07/29 (65 y.o. Female) Treating RN: Carolyne Fiscal, Debi Primary Care Physician: [name] Other Clinician: Referring Physician: [name] Treating Physician/Extender: Frann Rider in Treatment: 1 Vascular Assessment Pulses: Posterior Tibial Dorsalis Pedis Palpable: [Left:Yes] Extremity colors, hair growth, and conditions: Extremity Color: [Left:Normal] Temperature of Extremity: [Left:Cool] Capillary Refill: [Left:< 3 seconds] Toe Nail Assessment Left: Right: Thick: No Discolored: No Deformed: Yes Improper Length and Hygiene: No Electronic Signature(s) Signed: 07/14/2015 5:08:36 PM By: Alric Quan Entered By: Alric Quan on 07/14/2015 14:48:34 Sandra Massey ([id number]) -------------------------------------------------------------------------------- Multi Wound Chart Details Patient Name: Sandra Massey Date of Service: 07/14/2015 3:00 PM Medical Record Number: [id number] Patient Account Number: [id number] Date of Birth/Sex: 03/31/1950 (65 y.o. Female) Treating RN: Carolyne Fiscal, Debi Primary Care Physician: [name] Other Clinician: Referring Physician: [name] Treating Physician/Extender: Frann Rider in Treatment: 1 Vital  Signs Height(in): 65 Pulse(bpm): 85 Weight(lbs): 216 Blood Pressure 158/84 (mmHg): Body Mass Index(BMI): 36 Temperature(F): Respiratory Rate 18 (breaths/min): Photos: [1:No Photos] [N/A:N/A] Wound Location: [1:Left Lower Leg - Anterior] [N/A:N/A] Wounding Event: [1:Bite] [N/A:N/A] Primary Etiology: [1:To be determined] [N/A:N/A] Comorbid History: [1:Hypertension, Type II Diabetes] [N/A:N/A] Date Acquired: [1:06/27/2015] [N/A:N/A] Weeks of Treatment: [1:1] [N/A:N/A] Wound Status: [1:Open] [N/A:N/A] Measurements L x W x D 0.9x1.8x0.1 [N/A:N/A] (cm) Area (cm) : [1:1.272] [N/A:N/A] Volume (cm) : [1:0.127] [N/A:N/A] % Reduction in Area: [1:22.20%] [N/A:N/A] % Reduction in Volume: 22.10% [N/A:N/A] Classification: [1:Partial Thickness] [N/A:N/A] HBO Classification: [1:Grade 1] [N/A:N/A] Exudate Amount: [1:Large] [N/A:N/A] Exudate Type: [1:Serous] [N/A:N/A] Exudate Color: [1:amber] [N/A:N/A] Wound Margin: [1:Flat and Intact] [N/A:N/A] Granulation Amount: [  1:Medium (34-66%)] [N/A:N/A] Necrotic Amount: [1:Medium (34-66%)] [N/A:N/A] Exposed Structures: [1:Fascia: No Fat: No Tendon: No Muscle: No Joint: No Bone: No] [N/A:N/A] Limited to Skin Breakdown Epithelialization: None N/A N/A Periwound Skin Texture: No Abnormalities Noted N/A N/A Periwound Skin Moist: Yes N/A N/A Moisture: Dry/Scaly: No Periwound Skin Color: Erythema: Yes N/A N/A Erythema Location: Circumferential N/A N/A Temperature: No Abnormality N/A N/A Tenderness on Yes N/A N/A Palpation: Wound Preparation: Ulcer Cleansing: N/A N/A Rinsed/Irrigated with Saline Topical Anesthetic Applied: Other: lidocaine 4% Treatment Notes Electronic Signature(s) Signed: 07/14/2015 5:08:36 PM By: Alric Quan Entered By: Alric Quan on 07/14/2015 14:53:52 Sandra Massey ([id number]) -------------------------------------------------------------------------------- Sandra Massey Details Patient Name:  Sandra Massey Date of Service: 07/14/2015 3:00 PM Medical Record Number: [id number] Patient Account Number: [id number] Date of Birth/Sex: 1950-03-25 (65 y.o. Female) Treating RN: Carolyne Fiscal, Debi Primary Care Physician: [name] Other Clinician: Referring Physician: [name] Treating Physician/Extender: Frann Rider in Treatment: 1 Active Inactive Abuse / Safety / Falls / Self Care Management Nursing Diagnoses: Potential for falls Goals: Patient will remain injury free Date Initiated: 07/07/2015 Goal Status: Active Interventions: Assess fall risk on admission and as needed Notes: Nutrition Nursing Diagnoses: Impaired glucose control: actual or potential Goals: Patient/caregiver will maintain therapeutic glucose control Date Initiated: 07/07/2015 Goal Status: Active Interventions: Assess patient nutrition upon admission and as needed per policy Notes: Orientation to the Wound Care Program Nursing Diagnoses: Knowledge deficit related to the wound healing center program Goals: Patient/caregiver will verbalize understanding of the [facility] Date Initiated: 07/07/2015 DJENABA, MARIA ([id number]) Goal Status: Active Interventions: Provide education on orientation to the wound center Notes: Wound/Skin Impairment Nursing Diagnoses: Impaired tissue integrity Goals: Ulcer/skin breakdown will have a volume reduction of 30% by week 4 Date Initiated: 07/07/2015 Goal Status: Active Interventions: Assess patient/caregiver ability to obtain necessary supplies Assess ulceration(s) every visit Notes: Electronic Signature(s) Signed: 07/14/2015 5:08:36 PM By: Alric Quan Entered By: Alric Quan on 07/14/2015 14:53:44 Sandra Massey ([id number]) -------------------------------------------------------------------------------- Pain Assessment Details Patient Name: Sandra Massey Date of Service: 07/14/2015 3:00 PM Medical Record Number:  [id number] Patient Account Number: [id number] Date of Birth/Sex: 1950/07/04 (65 y.o. Female) Treating RN: Carolyne Fiscal, Debi Primary Care Physician: [name] Other Clinician: Referring Physician: [name] Treating Physician/Extender: Frann Rider in Treatment: 1 Active Problems Location of Pain Severity and Description of Pain Patient Has Paino No Site Locations Pain Management and Medication Current Pain Management: Electronic Signature(s) Signed: 07/14/2015 5:08:36 PM By: Alric Quan Entered By: Alric Quan on 07/14/2015 14:44:22 Sandra Massey ([id number]) -------------------------------------------------------------------------------- Patient/Caregiver Education Details Patient Name: Sandra Massey Date of Service: 07/14/2015 3:00 PM Medical Record Number: [id number] Patient Account Number: [id number] Date of Birth/Gender: Apr 21, 1950 (65 y.o. Female) Treating RN: Carolyne Fiscal, Debi Primary Care Physician: [name] Other Clinician: Referring Physician: [name] Treating Physician/Extender: Frann Rider in Treatment: 1 Education Assessment Education Provided To: Patient Education Topics Provided Wound/Skin Impairment: Handouts: Other: change dressing as ordered Methods: Demonstration, Explain/Verbal Responses: State content correctly Electronic Signature(s) Signed: 07/14/2015 5:08:36 PM By: Alric Quan Entered By: Alric Quan on 07/14/2015 15:01:50 Sandra Massey ([id number]) -------------------------------------------------------------------------------- Wound Assessment Details Patient Name: Sandra Massey Date of Service: 07/14/2015 3:00 PM Medical Record Number: [id number] Patient Account Number: [id number] Date of Birth/Sex: 03/06/1951 (65 y.o. Female) Treating RN: Carolyne Fiscal, Debi Primary Care Physician: [name] Other Clinician: Referring Physician: [name] Treating Physician/Extender: Frann Rider in Treatment: 1 Wound Status Wound Number: 1 Primary Etiology: To be determined  Wound Location: Left Lower Leg - Anterior Wound Status: Open Wounding Event: Bite Comorbid History: Hypertension, Type II Diabetes Date Acquired: 06/27/2015 Weeks Of Treatment: 1 Clustered Wound: No Photos Photo Uploaded By: Alric Quan on 07/14/2015 15:39:33 Wound Measurements Length: (cm) 0.9 Width: (cm) 1.8 Depth: (cm) 0.1 Area: (cm) 1.272 Volume: (cm) 0.127 % Reduction in Area: 22.2% % Reduction in Volume: 22.1% Epithelialization: None Tunneling: No Undermining: No Wound Description Classification: Partial Thickness Diabetic Severity Earleen Newport): Grade 1 Wound Margin: Flat and Intact Exudate Amount: Large Exudate Type: Serous Exudate Color: amber Wound Bed Granulation Amount: Medium (34-66%) Exposed Structure Necrotic Amount: Medium (34-66%) Fascia Exposed: No Necrotic Quality: Adherent Slough Fat Layer Exposed: No FAREEDAH, LOFTIN ([id number]) Tendon Exposed: No Muscle Exposed: No Joint Exposed: No Bone Exposed: No Limited to Skin Breakdown Periwound Skin Texture Texture Color No Abnormalities Noted: No No Abnormalities Noted: No Erythema: Yes Moisture Erythema Location: Circumferential No Abnormalities Noted: No Dry / Scaly: No Temperature / Pain Moist: Yes Temperature: No Abnormality Tenderness on Palpation: Yes Wound Preparation Ulcer Cleansing: Rinsed/Irrigated with Saline Topical Anesthetic Applied: Other: lidocaine 4%, Treatment Notes Wound #1 (Left, Anterior Lower Leg) 1. Cleansed with: Clean wound with Normal Saline 2. Anesthetic Topical Lidocaine 4% cream to wound bed prior to debridement 4. Dressing Applied: Prisma Ag 5. Secondary Dressing Applied Bordered Foam Dressing Dry Gauze Electronic Signature(s) Signed: 07/14/2015 5:08:36 PM By: Alric Quan Entered By: Alric Quan on 07/14/2015 14:53:02 Sandra Massey  ([id number]) -------------------------------------------------------------------------------- Amo Details Patient Name: Sandra Massey Date of Service: 07/14/2015 3:00 PM Medical Record Number: [id number] Patient Account Number: [id number] Date of Birth/Sex: Jan 20, 1951 (65 y.o. Female) Treating RN: Carolyne Fiscal, Debi Primary Care Physician: [name] Other Clinician: Referring Physician: [name] Treating Physician/Extender: Frann Rider in Treatment: 1 Vital Signs Time Taken: 14:44 Pulse (bpm): 85 Height (in): 65 Respiratory Rate (breaths/min): 18 Weight (lbs): 216 Blood Pressure (mmHg): 158/84 Body Mass Index (BMI): 35.9 Reference Range: 80 - 120 mg / dl Electronic Signature(s) Signed: 07/14/2015 5:08:36 PM By: Alric Quan Entered By: Alric Quan on 07/14/2015 14:47:31

## 2015-07-21 ENCOUNTER — Encounter: Payer: Medicare HMO | Admitting: Surgery

## 2015-07-21 DIAGNOSIS — E11622 Type 2 diabetes mellitus with other skin ulcer: Secondary | ICD-10-CM | POA: Diagnosis not present

## 2015-07-22 NOTE — Progress Notes (Signed)
[name], [name] ([id number]) Visit Report for 07/21/2015 Chief Complaint Document Details Patient Name: Sandra Massey, Massey 07/21/2015 3:45 Date of Service: PM Medical Record [id number] Number: Patient Account Number: [id number] [birth year] (65 y.o. Treating RN: Ahmed Prima Date of Birth/Sex: Female) Other Clinician: Primary Care Physician: [name] Treating Christin Fudge Referring Physician: [name] Physician/Extender: Suella Grove in Treatment: 2 Information Obtained from: Patient Chief Complaint Patients presents for treatment of an open diabetic ulcer to the left lower extremity which she's had for about 3 weeks Electronic Signature(s) Signed: 07/21/2015 3:58:02 PM By: Christin Fudge MD, FACS Entered By: Christin Fudge on 07/21/2015 15:58:02 Sandra Massey ([id number]) -------------------------------------------------------------------------------- HPI Details Patient Name: Sandra Massey, Sandra Massey 07/21/2015 3:45 Date of Service: PM Medical Record [id number] Number: Patient Account Number: [id number] 29-Oct-1950 (65 y.o. Treating RN: Ahmed Prima Date of Birth/Sex: Female) Other Clinician: Primary Care Physician: [name] Treating Christin Fudge Referring Physician: [name] Physician/Extender: Suella Grove in Treatment: 2 History of Present Illness Location: left anterior medial calf Quality: Patient reports experiencing a sharp pain to affected area(s). Severity: Patient states wound are getting worse. Duration: Patient has had the wound for < 3 weeks prior to presenting for treatment Timing: Pain in wound is constant (hurts all the time) Context: The wound occurred when the patient was possibly stung by an insect and had a sharp pain when she was wearing a pair of trousers. Modifying Factors: Other treatment(s) tried include:had Bactrim for a week and now is on Keflex. Associated Signs and Symptoms: Patient reports having increase discharge. HPI Description:  65 year old patient with uncontrolled diabetes, hyperlipidemia, CVA, ulcer of the left calf which has been there for about 3 weeks now. Her last hemoglobin A1c on March 20 was 7.8 and her other labs were reviewed. she has never been a smoker. Past surgical history significant for cholecystectomy and rotator cuff repair. Electronic Signature(s) Signed: 07/21/2015 3:58:06 PM By: Christin Fudge MD, FACS Entered By: Christin Fudge on 07/21/2015 15:58:06 Sandra Massey ([id number]) -------------------------------------------------------------------------------- Physical Exam Details Patient Name: Sandra Massey, Sandra Massey 07/21/2015 3:45 Date of Service: PM Medical Record [id number] Number: Patient Account Number: [id number] 1950-11-17 (65 y.o. Treating RN: Ahmed Prima Date of Birth/Sex: Female) Other Clinician: Primary Care Physician: [name] Treating Christin Fudge Referring Physician: [name] Physician/Extender: Weeks in Treatment: 2 Constitutional . Pulse regular. Respirations normal and unlabored. Afebrile. . Eyes Nonicteric. Reactive to light. Ears, Nose, Mouth, and Throat Lips, teeth, and gums WNL.Marland Kitchen Moist mucosa without lesions. Neck supple and nontender. No palpable supraclavicular or cervical adenopathy. Normal sized without goiter. Respiratory WNL. No retractions.. Cardiovascular Pedal Pulses WNL. No clubbing, cyanosis or edema. Lymphatic No adneopathy. No adenopathy. No adenopathy. Musculoskeletal Adexa without tenderness or enlargement.. Digits and nails w/o clubbing, cyanosis, infection, petechiae, ischemia, or inflammatory conditions.. Integumentary (Hair, Skin) No suspicious lesions. No crepitus or fluctuance. No peri-wound warmth or erythema. No masses.Marland Kitchen Psychiatric Judgement and insight Intact.. No evidence of depression, anxiety, or agitation.. Notes wound is looking excellent and there are very small areas with micro-ulcerations which are open and  no debridement was required today. Electronic Signature(s) Signed: 07/21/2015 3:58:30 PM By: Christin Fudge MD, FACS Entered By: Christin Fudge on 07/21/2015 15:58:30 Sandra Massey ([id number]) -------------------------------------------------------------------------------- Physician Orders Details Patient Name: Sandra Massey, Sandra Massey 07/21/2015 3:45 Date of Service: PM Medical Record [id number] Number: Patient Account Number: [id number] [birth year] (65 y.o. Treating RN: Ahmed Prima Date of Birth/Sex: Female) Other Clinician: Primary Care Physician: [name] Treating Christin Fudge Referring Physician: [name] Physician/Extender: Suella Grove in Treatment:  2 Verbal / Phone Orders: Yes Clinician: Pinkerton, Debi Read Back and Verified: Yes Diagnosis Coding Wound Cleansing Wound #1 Left,Anterior Lower Leg o Clean wound with Normal Saline. o Cleanse wound with mild soap and water Anesthetic Wound #1 Left,Anterior Lower Leg o Topical Lidocaine 4% cream applied to wound bed prior to debridement Secondary Dressing Wound #1 Left,Anterior Lower Leg o Boardered Foam Dressing Dressing Change Frequency Wound #1 Left,Anterior Lower Leg o Other: - every 3 days Follow-up Appointments Wound #1 Left,Anterior Lower Leg o Return Appointment in 1 week. Electronic Signature(s) Signed: 07/21/2015 3:59:17 PM By: Christin Fudge MD, FACS Signed: 07/21/2015 5:12:54 PM By: Alric Quan Entered By: Alric Quan on 07/21/2015 15:24:17 Sandra Massey ([id number]) -------------------------------------------------------------------------------- Problem List Details Patient Name: Sandra Massey, Sandra Massey 07/21/2015 3:45 Date of Service: PM Medical Record [id number] Number: Patient Account Number: [id number] [birth year] (65 y.o. 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 Treating RN: Ahmed Prima Date of Birth/Sex: Female) Other Clinician: Primary Care Physician: [name] Treating Ercel Normoyle Referring Physician: [name] Physician/Extender: Suella Grove in Treatment: 2 Subjective Chief Complaint Information obtained from Patient Patients presents for treatment of an open diabetic ulcer to the left lower extremity which she's had for about 3 weeks History of Present Illness (HPI) The following HPI elements were documented for the patient's wound: Location: left anterior medial calf Quality: Patient reports experiencing a sharp pain to affected area(s). Severity: Patient states wound are getting worse. Duration: Patient has had the wound for < 3 weeks prior to presenting for treatment Timing: Pain in wound is constant (hurts all the time) Context: The wound occurred when the patient was possibly stung by an insect and had a sharp pain when she was wearing a pair of trousers. Modifying Factors: Other treatment(s) tried include:had Bactrim for a week and now is on Keflex. Associated Signs and Symptoms:  Patient reports having increase discharge. 65 year old patient with uncontrolled diabetes, hyperlipidemia, CVA, ulcer of the left calf which has been there for about 3 weeks now. Her last hemoglobin A1c on March 20 was 7.8 and her other labs were reviewed. she has never been a smoker. Past surgical history significant for cholecystectomy and rotator cuff repair. Objective Constitutional Pulse regular. Respirations normal and unlabored. Afebrile. Vitals Time Taken: 3:09 PM, Height: 65 in, Weight: 216 lbs, BMI: 35.9, Pulse: 96 bpm, Respiratory Rate: 18 breaths/min, Blood Pressure: 169/100 mmHg. Sandra Massey, Sandra Massey ([id number]) Eyes Nonicteric. Reactive to light. Ears, Nose, Mouth, and Throat Lips, teeth, and gums WNL.Marland Kitchen Moist mucosa without lesions. Neck supple and nontender. No palpable supraclavicular or cervical adenopathy. Normal sized without goiter. Respiratory WNL. No retractions.. Cardiovascular Pedal Pulses WNL. No clubbing, cyanosis or edema. Lymphatic No adneopathy. No adenopathy. No adenopathy. Musculoskeletal Adexa without tenderness or enlargement.. Digits and nails w/o clubbing, cyanosis, infection, petechiae,  ischemia, or inflammatory conditions.Marland Kitchen Psychiatric Judgement and insight Intact.. No evidence of depression, anxiety, or agitation.. General Notes: wound is looking excellent and there are very small areas with micro-ulcerations which are open and no debridement was required today. Integumentary (Hair, Skin) No suspicious lesions. No crepitus or fluctuance. No peri-wound warmth or erythema. No masses.. Wound #1 status is Open. Original cause of wound was Bite. The wound is located on the Left,Anterior Lower Leg. The wound measures 1cm length x 0.9cm width x 0.1cm depth; 0.707cm^2 area and 0.071cm^3 volume. The wound is limited to skin breakdown. There is no tunneling or undermining noted. There is a large amount of serous drainage noted. The wound margin is flat and  intact. There is medium (34-66%) granulation within the wound bed. There is a medium (34-66%) amount of necrotic tissue within the wound bed including Adherent Slough. The periwound skin appearance exhibited: Moist, Erythema. The periwound skin appearance did not exhibit: Dry/Scaly. The surrounding wound skin color is noted with erythema which is circumferential. Periwound temperature was noted as No Abnormality. The periwound has tenderness on palpation. 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 Cleanse wound with mild soap and water Anesthetic: Wound #1 Left,Anterior Lower Leg: Topical Lidocaine 4% cream applied to wound bed prior to debridement Secondary Dressing: Wound #1 Left,Anterior Lower Leg: Boardered Foam Dressing Dressing Change Frequency: Wound #1 Left,Anterior Lower Leg: Other: - every 3 days Follow-up Appointments: Wound #1 Left,Anterior Lower Leg: Return Appointment in 1 week. I have recommended a bordered foam dressing to be applied once in 3 days and anticipate discharge by the time we see her next week Electronic Signature(s) Signed: 07/21/2015 3:58:56 PM By: Christin Fudge MD, FACS Entered By: Christin Fudge on 07/21/2015 15:58:56 Sandra Massey ([id number]) -------------------------------------------------------------------------------- SuperBill Details Patient Name: Sandra Massey Date of Service: 07/21/2015 Medical Record Number: [id number] Patient Account Number: [id number] Date of Birth/Sex: [birth year] (64 y.o. 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 Bitten or stung by nonvenomous insect and other nonvenomous arthropods, initial W57.Merril Abbe encounter Facility Procedures CPT4 Code: [id number] Description: [id number] - WOUND CARE VISIT-LEV 3 EST PT Modifier: Quantity: 1 Physician Procedures CPT4: Description Modifier Quantity Code [id number] - WC PHYS LEVEL 3 - EST PT 1 ICD-10 Description Diagnosis E11.622 Type 2 diabetes mellitus 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

## 2015-07-22 NOTE — Progress Notes (Signed)
Sandra Massey, Sandra Massey ([id number]) Visit Report for 07/21/2015 Arrival Information Details Patient Name: Sandra Massey, Sandra Massey Date of Service: 07/21/2015 3:45 PM Medical Record Number: [id number] Patient Account Number: [id number] Date of Birth/Sex: 1950-04-02 (64 y.o. Female) Treating RN: Carolyne Fiscal, Debi Primary Care Physician: [name] Other Clinician: Referring Physician: [name] Treating Physician/Extender: Frann Rider in Treatment: 2 Visit Information History Since Last Visit All ordered tests and consults were completed: No Patient Arrived: Ambulatory Added or deleted any medications: No Arrival Time: 15:09 Any new allergies or adverse reactions: No Accompanied By: sister Had a fall or experienced change in No Transfer Assistance: None activities of daily living that may affect Patient Identification Verified: Yes risk of falls: Secondary Verification Process Yes Signs or symptoms of abuse/neglect since last No Completed: visito Patient Requires Transmission- No Hospitalized since last visit: No Based Precautions: Pain Present Now: No Patient Has Alerts: Yes Patient Alerts: Patient on Blood Thinner ASA Electronic Signature(s) Signed: 07/21/2015 5:12:54 PM By: Alric Quan Entered By: Alric Quan on 07/21/2015 15:09:41 Sandra Massey ([id number]) -------------------------------------------------------------------------------- Clinic Level of Care Assessment Details Patient Name: Sandra Massey Date of Service: 07/21/2015 3:45 PM Medical Record Number: [id number] Patient Account Number: [id number] Date of Birth/Sex: 1950-06-22 (64 y.o. Female) Treating RN: Carolyne Fiscal, Debi Primary Care Physician: [name] Other Clinician: Referring Physician: [name] Treating Physician/Extender: Frann Rider in Treatment: 2 Clinic Level of Care Assessment Items TOOL 4 Quantity Score X - Use when only an EandM is performed on FOLLOW-UP visit 1  0 ASSESSMENTS - Nursing Assessment / Reassessment X - Reassessment of Co-morbidities (includes updates in patient status) 1 10 X - Reassessment of Adherence to Treatment Plan 1 5 ASSESSMENTS - Wound and Skin Assessment / Reassessment X - Simple Wound Assessment / Reassessment - one wound 1 5 []  - Complex Wound Assessment / Reassessment - multiple wounds 0 []  - Dermatologic / Skin Assessment (not related to wound area) 0 ASSESSMENTS - Focused Assessment []  - Circumferential Edema Measurements - multi extremities 0 []  - Nutritional Assessment / Counseling / Intervention 0 []  - Lower Extremity Assessment (monofilament, tuning fork, pulses) 0 []  - Peripheral Arterial Disease Assessment (using hand held doppler) 0 ASSESSMENTS - Ostomy and/or Continence Assessment and Care []  - Incontinence Assessment and Management 0 []  - Ostomy Care Assessment and Management (repouching, etc.) 0 PROCESS - Coordination of Care X - Simple Patient / Family Education for ongoing care 1 15 []  - Complex (extensive) Patient / Family Education for ongoing care 0 []  - Staff obtains Programmer, systems, Records, Test Results / Process Orders 0 []  - Staff telephones HHA, Nursing Homes / Clarify orders / etc 0 []  - Routine Transfer to another Facility (non-emergent condition) 0 Sandra Massey, Sandra Massey ([id number]) []  - Routine Hospital Admission (non-emergent condition) 0 []  - New Admissions / Biomedical engineer / Ordering NPWT, Apligraf, etc. 0 []  - Emergency Hospital Admission (emergent condition) 0 X - Simple Discharge Coordination 1 10 []  - Complex (extensive) Discharge Coordination 0 PROCESS - Special Needs []  - Pediatric / Minor Patient Management 0 []  - Isolation Patient Management 0 []  - Hearing / Language / Visual special needs 0 []  - Assessment of Community assistance (transportation, D/C planning, etc.) 0 []  - Additional assistance / Altered mentation 0 []  - Support Surface(s) Assessment (bed, cushion, seat, etc.)  0 INTERVENTIONS - Wound Cleansing / Measurement X - Simple Wound Cleansing - one wound 1 5 []  - Complex Wound Cleansing - multiple wounds 0 X - Wound Imaging (photographs -  any number of wounds) 1 5 []  - Wound Tracing (instead of photographs) 0 X - Simple Wound Measurement - one wound 1 5 []  - Complex Wound Measurement - multiple wounds 0 INTERVENTIONS - Wound Dressings X - Small Wound Dressing one or multiple wounds 1 10 []  - Medium Wound Dressing one or multiple wounds 0 []  - Large Wound Dressing one or multiple wounds 0 X - Application of Medications - topical 1 5 []  - Application of Medications - injection 0 INTERVENTIONS - Miscellaneous []  - External ear exam 0 Sandra Massey, Sandra Massey ([id number]) []  - Specimen Collection (cultures, biopsies, blood, body fluids, etc.) 0 []  - Specimen(s) / Culture(s) sent or taken to Lab for analysis 0 []  - Patient Transfer (multiple staff / Harrel Lemon Lift / Similar devices) 0 []  - Simple Staple / Suture removal (25 or less) 0 []  - Complex Staple / Suture removal (26 or more) 0 []  - Hypo / Hyperglycemic Management (close monitor of Blood Glucose) 0 []  - Ankle / Brachial Index (ABI) - do not check if billed separately 0 X - Vital Signs 1 5 Has the patient been seen at the hospital within the last three years: Yes Total Score: 80 Level Of Care: New/Established - Level 3 Electronic Signature(s) Signed: 07/21/2015 5:12:54 PM By: Alric Quan Entered By: Alric Quan on 07/21/2015 16:06:23 Sandra Massey ([id number]) -------------------------------------------------------------------------------- Encounter Discharge Information Details Patient Name: Sandra Massey Date of Service: 07/21/2015 3:45 PM Medical Record Number: [id number] Patient Account Number: [id number] Date of Birth/Sex: November 04, 1950 (64 y.o. Female) Treating RN: Carolyne Fiscal, Debi Primary Care Physician: [name] Other Clinician: Referring Physician: [name] Treating  Physician/Extender: Frann Rider in Treatment: 2 Encounter Discharge Information Items Discharge Pain Level: 0 Discharge Condition: Stable Ambulatory Status: Ambulatory Discharge Destination: Home Transportation: Private Auto Accompanied By: self Schedule Follow-up Appointment: Yes Medication Reconciliation completed and provided to Patient/Care Yes Hendricks Schwandt: Provided on Clinical Summary of Care: 07/21/2015 Form Type Recipient Paper Patient SO Electronic Signature(s) Signed: 07/21/2015 3:31:26 PM By: Ruthine Dose Entered By: Ruthine Dose on 07/21/2015 15:31:26 Sandra Massey ([id number]) -------------------------------------------------------------------------------- Lower Extremity Assessment Details Patient Name: Sandra Massey Date of Service: 07/21/2015 3:45 PM Medical Record Number: [id number] Patient Account Number: [id number] Date of Birth/Sex: Feb 06, 1951 (64 y.o. Female) Treating RN: Carolyne Fiscal, Debi Primary Care Physician: [name] Other Clinician: Referring Physician: [name] Treating Physician/Extender: Frann Rider in Treatment: 2 Vascular Assessment Pulses: Posterior Tibial Dorsalis Pedis Palpable: [Right:Yes] Extremity colors, hair growth, and conditions: Extremity Color: [Right:Normal] Temperature of Extremity: [Right:Warm] Capillary Refill: [Right:< 3 seconds] Electronic Signature(s) Signed: 07/21/2015 5:12:54 PM By: Alric Quan Entered By: Alric Quan on 07/21/2015 15:15:15 Sandra Massey ([id number]) -------------------------------------------------------------------------------- Multi Wound Chart Details Patient Name: Sandra Massey Date of Service: 07/21/2015 3:45 PM Medical Record Number: [id number] Patient Account Number: [id number] Date of Birth/Sex: 1950-06-15 (64 y.o. Female) Treating RN: Carolyne Fiscal, Debi Primary Care Physician: [name] Other Clinician: Referring Physician: [name] Treating  Physician/Extender: Frann Rider in Treatment: 2 Vital Signs Height(in): 65 Pulse(bpm): 96 Weight(lbs): 216 Blood Pressure 169/100 (mmHg): Body Mass Index(BMI): 36 Temperature(F): Respiratory Rate 18 (breaths/min): Photos: [1:No Photos] [N/A:N/A] Wound Location: [1:Left Lower Leg - Anterior] [N/A:N/A] Wounding Event: [1:Bite] [N/A:N/A] Primary Etiology: [1:Diabetic Wound/Ulcer of the Lower Extremity] [N/A:N/A] Comorbid History: [1:Hypertension, Type II Diabetes] [N/A:N/A] Date Acquired: [1:06/27/2015] [N/A:N/A] Weeks of Treatment: [1:2] [N/A:N/A] Wound Status: [1:Open] [N/A:N/A] Measurements L x W x D 1x0.9x0.1 [N/A:N/A] (cm) Area (cm) : [1:0.707] [N/A:N/A] Volume (cm) : [1:0.071] [N/A:N/A] %  Reduction in Area: [1:56.70%] [N/A:N/A] % Reduction in Volume: 56.40% [N/A:N/A] Classification: [1:Grade 1] [N/A:N/A] Exudate Amount: [1:Large] [N/A:N/A] Exudate Type: [1:Serous] [N/A:N/A] Exudate Color: [1:amber] [N/A:N/A] Wound Margin: [1:Flat and Intact] [N/A:N/A] Granulation Amount: [1:Medium (34-66%)] [N/A:N/A] Necrotic Amount: [1:Medium (34-66%)] [N/A:N/A] Exposed Structures: [1:Fascia: No Fat: No Tendon: No Muscle: No Joint: No Bone: No] [N/A:N/A] Limited to Skin Breakdown Epithelialization: None N/A N/A Periwound Skin Texture: No Abnormalities Noted N/A N/A Periwound Skin Moist: Yes N/A N/A Moisture: Dry/Scaly: No Periwound Skin Color: Erythema: Yes N/A N/A Erythema Location: Circumferential N/A N/A Temperature: No Abnormality N/A N/A Tenderness on Yes N/A N/A Palpation: Wound Preparation: Ulcer Cleansing: N/A N/A Rinsed/Irrigated with Saline Topical Anesthetic Applied: Other: lidocaine 4% Treatment Notes Electronic Signature(s) Signed: 07/21/2015 5:12:54 PM By: Alric Quan Entered By: Alric Quan on 07/21/2015 15:20:00 Sandra Massey  ([id number]) -------------------------------------------------------------------------------- [place] Details Patient Name: Sandra Massey Date of Service: 07/21/2015 3:45 PM Medical Record Number: [id number] Patient Account Number: [id number] Date of Birth/Sex: 1950-07-16 (64 y.o. Female) Treating RN: Carolyne Fiscal, Debi Primary Care Physician: [name] Other Clinician: Referring Physician: [name] Treating Physician/Extender: Frann Rider in Treatment: 2 Active Inactive Abuse / Safety / Falls / Self Care Management Nursing Diagnoses: Potential for falls Goals: Patient will remain injury free Date Initiated: 07/07/2015 Goal Status: Active Interventions: Assess fall risk on admission and as needed Notes: Nutrition Nursing Diagnoses: Impaired glucose control: actual or potential Goals: Patient/caregiver will maintain therapeutic glucose control Date Initiated: 07/07/2015 Goal Status: Active Interventions: Assess patient nutrition upon admission and as needed per policy Notes: Orientation to the Wound Care Program Nursing Diagnoses: Knowledge deficit related to the wound healing center program Goals: Patient/caregiver will verbalize understanding of the [place] Date Initiated: 07/07/2015 Sandra Massey, Sandra Massey ([id number]) Goal Status: Active Interventions: Provide education on orientation to the wound center Notes: Wound/Skin Impairment Nursing Diagnoses: Impaired tissue integrity Goals: Ulcer/skin breakdown will have a volume reduction of 30% by week 4 Date Initiated: 07/07/2015 Goal Status: Active Interventions: Assess patient/caregiver ability to obtain necessary supplies Assess ulceration(s) every visit Notes: Electronic Signature(s) Signed: 07/21/2015 5:12:54 PM By: Alric Quan Entered By: Alric Quan on 07/21/2015 15:19:53 Sandra Massey  ([id number]) -------------------------------------------------------------------------------- Pain Assessment Details Patient Name: Sandra Massey Date of Service: 07/21/2015 3:45 PM Medical Record Number: [id number] Patient Account Number: [id number] Date of Birth/Sex: 06-16-1950 (64 y.o. Female) Treating RN: Carolyne Fiscal, Debi Primary Care Physician: [name] Other Clinician: Referring Physician: [name] Treating Physician/Extender: Frann Rider in Treatment: 2 Active Problems Location of Pain Severity and Description of Pain Patient Has Paino No Site Locations Pain Management and Medication Current Pain Management: Electronic Signature(s) Signed: 07/21/2015 5:12:54 PM By: Alric Quan Entered By: Alric Quan on 07/21/2015 15:09:47 Sandra Massey ([id number]) -------------------------------------------------------------------------------- Patient/Caregiver Education Details Patient Name: Sandra Massey Date of Service: 07/21/2015 3:45 PM Medical Record Number: [id number] Patient Account Number: [id number] Date of Birth/Gender: [birth year] (64 y.o. Female) Treating RN: Carolyne Fiscal, Debi Primary Care Physician: [name] Other Clinician: Referring Physician: [name] Treating Physician/Extender: Frann Rider in Treatment: 2 Education Assessment Education Provided To: Patient Education Topics Provided Wound/Skin Impairment: Handouts: Other: change dressing as ordered Methods: Demonstration, Explain/Verbal Responses: State content correctly Electronic Signature(s) Signed: 07/21/2015 5:12:54 PM By: Alric Quan Entered By: Alric Quan on 07/21/2015 15:29:15 Sandra Massey ([id number]) -------------------------------------------------------------------------------- Wound Assessment Details Patient Name: Sandra Massey Date of Service: 07/21/2015 3:45 PM Medical Record Number: [id number] Patient Account Number: [id number] Date of  Birth/Sex: 1950/09/05 (64  y.o. Female) Treating RN: Carolyne Fiscal, Debi Primary Care Physician: [name] Other Clinician: Referring Physician: [name] Treating Physician/Extender: Frann Rider in Treatment: 2 Wound Status Wound Number: 1 Primary Diabetic Wound/Ulcer of the Lower Etiology: Extremity Wound Location: Left Lower Leg - Anterior Wound Status: Open Wounding Event: Bite Comorbid Hypertension, Type II Diabetes Date Acquired: 06/27/2015 History: Weeks Of Treatment: 2 Clustered Wound: No Wound Measurements Length: (cm) 1 Width: (cm) 0.9 Depth: (cm) 0.1 Area: (cm) 0.707 Volume: (cm) 0.071 % Reduction in Area: 56.7% % Reduction in Volume: 56.4% Epithelialization: None Tunneling: No Undermining: No Wound Description Classification: Grade 1 Wound Margin: Flat and Intact Exudate Amount: Large Exudate Type: Serous Exudate Color: amber Wound Bed Granulation Amount: Medium (34-66%) Exposed Structure Necrotic Amount: Medium (34-66%) Fascia Exposed: No Necrotic Quality: Adherent Slough Fat Layer Exposed: No Tendon Exposed: No Muscle Exposed: No Joint Exposed: No Bone Exposed: No Limited to Skin Breakdown Periwound Skin Texture Texture Color No Abnormalities Noted: No No Abnormalities Noted: No Erythema: Yes Moisture Erythema Location: Circumferential No Abnormalities Noted: No Dry / Scaly: No Temperature / Pain Moist: Yes Temperature: No Abnormality Sandra Massey, Sandra Massey. ([id number]) Tenderness on Palpation: Yes Wound Preparation Ulcer Cleansing: Rinsed/Irrigated with Saline Topical Anesthetic Applied: Other: lidocaine 4%, Treatment Notes Wound #1 (Left, Anterior Lower Leg) 1. Cleansed with: Clean wound with Normal Saline 2. Anesthetic Topical Lidocaine 4% cream to wound bed prior to debridement 3. Peri-wound Care: Skin Prep 5. Secondary Dressing Applied Bordered Foam Dressing Electronic Signature(s) Signed: 07/21/2015 5:12:54 PM By:  Alric Quan Entered By: Alric Quan on 07/21/2015 15:19:09 Sandra Massey ([id number]) -------------------------------------------------------------------------------- [place] Details Patient Name: Sandra Massey Date of Service: 07/21/2015 3:45 PM Medical Record Number: [id number] Patient Account Number: [id number] Date of Birth/Sex: 12-28-1950 (65 y.o. Female) Treating RN: Carolyne Fiscal, Debi Primary Care Physician: [name] Other Clinician: Referring Physician: [name] Treating Physician/Extender: Frann Rider in Treatment: 2 Vital Signs Time Taken: 15:09 Pulse (bpm): 96 Height (in): 65 Respiratory Rate (breaths/min): 18 Weight (lbs): 216 Blood Pressure (mmHg): 169/100 Body Mass Index (BMI): 35.9 Reference Range: 80 - 120 mg / dl Electronic Signature(s) Signed: 07/21/2015 5:12:54 PM By: Alric Quan Entered By: Alric Quan on 07/21/2015 15:14:15

## 2015-07-28 ENCOUNTER — Encounter: Payer: Medicare HMO | Admitting: Surgery

## 2015-07-28 DIAGNOSIS — E11622 Type 2 diabetes mellitus with other skin ulcer: Secondary | ICD-10-CM | POA: Diagnosis not present

## 2015-07-29 NOTE — Progress Notes (Addendum)
Sandra Massey, Sandra Massey ([id number]) Visit Report for 07/28/2015 Chief Complaint Document Details Patient Name: Sandra Massey, Sandra Massey 07/28/2015 2:15 Date of Service: PM Medical Record [id number] Number: Patient Account Number: [id number] 06/16/1950 (65 y.o. Treating RN: Ahmed Prima Date of Birth/Sex: Female) Other Clinician: Primary Care Physician: [name] Treating Christin Fudge Referring Physician: [name] Physician/Extender: Suella Grove in Treatment: 3 Information Obtained from: Patient Chief Complaint Patients presents for treatment of an open diabetic ulcer to the left lower extremity which she's had for about 3 weeks Electronic Signature(s) Signed: 07/28/2015 1:58:57 PM By: Christin Fudge MD, FACS Entered By: Christin Fudge on 07/28/2015 13:58:56 Sandra Massey ([id number]) -------------------------------------------------------------------------------- HPI Details Patient Name: Sandra Massey, Sandra Massey 07/28/2015 2:15 Date of Service: PM Medical Record [id number] Number: Patient Account Number: [id number] 1951/02/18 (65 y.o. Treating RN: Ahmed Prima Date of Birth/Sex: Female) Other Clinician: Primary Care Physician: [name] Treating Christin Fudge Referring Physician: [name] Physician/Extender: Suella Grove in Treatment: 3 History of Present Illness Location: left anterior medial calf Quality: Patient reports experiencing a sharp pain to affected area(s). Severity: Patient states wound are getting worse. Duration: Patient has had the wound for < 3 weeks prior to presenting for treatment Timing: Pain in wound is constant (hurts all the time) Context: The wound occurred when the patient was possibly stung by an insect and had a sharp pain when she was wearing a pair of trousers. Modifying Factors: Other treatment(s) tried include:had Bactrim for a week and now is on Keflex. Associated Signs and Symptoms: Patient reports having increase discharge. HPI Description:  65 year old patient with uncontrolled diabetes, hyperlipidemia, CVA, ulcer of the left calf which has been there for about 3 weeks now. Her last hemoglobin A1c on March 20 was 7.8 and her other labs were reviewed. she has never been a smoker. Past surgical history significant for cholecystectomy and rotator cuff repair. Electronic Signature(s) Signed: 07/28/2015 1:59:00 PM By: Christin Fudge MD, FACS Entered By: Christin Fudge on 07/28/2015 13:59:00 Sandra Massey ([id number]) -------------------------------------------------------------------------------- Physical Exam Details Patient Name: Sandra Massey, Sandra Massey 07/28/2015 2:15 Date of Service: PM Medical Record [id number] Number: Patient Account Number: [id number] 17-Sep-1950 (65 y.o. Treating RN: Ahmed Prima Date of Birth/Sex: Female) Other Clinician: Primary Care Physician: [name] Treating Christin Fudge Referring Physician: [name] Physician/Extender: Weeks in Treatment: 3 Constitutional . Pulse regular. Respirations normal and unlabored. Afebrile. . Eyes Nonicteric. Reactive to light. Ears, Nose, Mouth, and Throat Lips, teeth, and gums WNL.Marland Kitchen Moist mucosa without lesions. Neck supple and nontender. No palpable supraclavicular or cervical adenopathy. Normal sized without goiter. Respiratory WNL. No retractions.. Cardiovascular Pedal Pulses WNL. No clubbing, cyanosis or edema. Lymphatic No adneopathy. No adenopathy. No adenopathy. Musculoskeletal Adexa without tenderness or enlargement.. Digits and nails w/o clubbing, cyanosis, infection, petechiae, ischemia, or inflammatory conditions.. Integumentary (Hair, Skin) No suspicious lesions. No crepitus or fluctuance. No peri-wound warmth or erythema. No masses.Marland Kitchen Psychiatric Judgement and insight Intact.. No evidence of depression, anxiety, or agitation.. Notes the wound is completely healed and there are no open areas or evidence of ulceration. Electronic  Signature(s) Signed: 07/28/2015 2:24:50 PM By: Christin Fudge MD, FACS Entered By: Christin Fudge on 07/28/2015 14:24:49 Sandra Massey ([id number]) -------------------------------------------------------------------------------- Physician Orders Details Patient Name: Sandra Massey, Sandra Massey 07/28/2015 2:15 Date of Service: PM Medical Record [id number] Number: Patient Account Number: [id number] 01-20-1951 (64 y.o. 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 Call us if you need anything or have any questions. Electronic Signature(s) Signed: 07/28/2015 3:46:07 PM By: Christin Fudge MD, FACS Signed: 07/28/2015 4:07:37 PM By: Alric Quan Entered By: Alric Quan on 07/28/2015 14:21:58 Sandra Massey ([id number]) -------------------------------------------------------------------------------- Problem List Details Patient Name: Sandra Massey, Sandra Massey 07/28/2015 2:15 Date of Service: PM Medical Record [id number] Number: Patient Account Number: [id number] 06-13-1950 (65 y.o. Treating RN: Ahmed Prima Date of Birth/Sex: Female) Other Clinician: Primary Care Physician: [name] Treating Christin Fudge Referring Physician: [name] Physician/Extender: Suella Grove in Treatment: 3 Active Problems ICD-10 Encounter Code Description Active Date Diagnosis E11.622 Type 2 diabetes  mellitus with other skin ulcer 07/07/2015 Yes L97.222 Non-pressure chronic ulcer of left calf with fat layer 07/07/2015 Yes exposed W57.XXXA Bitten or stung by nonvenomous insect and other 07/07/2015 Yes nonvenomous arthropods, initial encounter Inactive Problems Resolved Problems Electronic Signature(s) Signed: 07/28/2015 1:58:52 PM By: Christin Fudge MD, FACS Entered By: Christin Fudge on 07/28/2015 13:58:52 Sandra Massey ([id number]) -------------------------------------------------------------------------------- Progress Note Details Patient Name: Sandra Massey, Sandra Massey 07/28/2015 2:15 Date of Service: PM Medical Record [id number] Number: Patient Account Number: [id number] 1950/09/13 (65 y.o. Treating RN: Ahmed Prima Date of Birth/Sex: Female) Other Clinician: Primary Care Physician: [name] Treating Luca Dyar Referring Physician: [name] Physician/Extender: Suella Grove in Treatment: 3 Subjective Chief Complaint Information obtained from Patient Patients presents for treatment of an open diabetic ulcer to the left lower extremity which she's had for about 3 weeks History of Present Illness (HPI) The following HPI elements were documented for the patient's wound: Location: left anterior medial calf Quality: Patient reports experiencing a sharp pain to affected area(s). Severity: Patient states wound are getting worse. Duration: Patient has had the wound for < 3 weeks prior to presenting for treatment Timing: Pain in wound is constant (hurts all the time) Context: The wound occurred when the patient was possibly stung by an insect and had a sharp pain when she was wearing a pair of trousers. Modifying Factors: Other treatment(s) tried include:had Bactrim for a week and now is on Keflex. Associated Signs and Symptoms: Patient reports having increase discharge. 65 year old patient with uncontrolled diabetes, hyperlipidemia, CVA, ulcer of the left calf which has been there  for about 3 weeks now. Her last hemoglobin A1c on March 20 was 7.8 and her other labs were reviewed. she has never been a smoker. Past surgical history significant for cholecystectomy and rotator cuff repair. Objective Constitutional Pulse regular. Respirations normal and unlabored. Afebrile. Vitals Time Taken: 2:06 PM, Height: 65 in, Weight: 216 lbs, BMI: 35.9, Temperature: 98.3 F, Pulse: 84 bpm, Respiratory Rate: 18 breaths/min, Blood Pressure: 153/104 mmHg. General Notes: Pts PCP is aware of pts elevated BP and has changed her BP medication. Sandra Massey, Sandra Massey ([id number]) Eyes Nonicteric. Reactive to light. Ears, Nose, Mouth, and Throat Lips, teeth, and gums WNL.Marland Kitchen Moist mucosa without lesions. Neck supple and nontender. No palpable supraclavicular or cervical adenopathy. Normal sized without goiter. Respiratory WNL. No retractions.. Cardiovascular Pedal Pulses WNL. No clubbing, cyanosis or edema. Lymphatic No adneopathy. No adenopathy. No adenopathy. Musculoskeletal Adexa without tenderness or enlargement.. Digits and nails w/o clubbing, cyanosis, infection, petechiae, ischemia, or  inflammatory conditions.Marland Kitchen Psychiatric Judgement and insight Intact.. No evidence of depression, anxiety, or agitation.. General Notes: the wound is completely healed and there are no open areas or evidence of ulceration. Integumentary (Hair, Skin) No suspicious lesions. No crepitus or fluctuance. No peri-wound warmth or erythema. No masses.. Wound #1 status is Healed - Epithelialized. Original cause of wound was Bite. The wound is located on the Left,Anterior Lower Leg. The wound measures 0cm length x 0cm width x 0cm depth; 0cm^2 area and 0cm^3 volume. Assessment Active Problems ICD-10 E11.622 - Type 2 diabetes mellitus with other skin ulcer L97.222 - Non-pressure chronic ulcer of left calf with fat layer exposed W57.Merril Abbe - Bitten or stung by nonvenomous insect and other nonvenomous arthropods,  initial encounter Sandra Massey, Sandra Massey ([id number]) Plan Discharge From [facility] Services: Discharge from [place] new skin protected. Call us if you need anything or have any questions. The wound is healed and she is discharged from the wound care services and be seen back as needed. We have asked her to wear supportive compression stockings if her edema increases and to be careful of the newly healed scar. Electronic Signature(s) Signed: 07/28/2015 2:25:23 PM By: Christin Fudge MD, FACS Entered By: Christin Fudge on 07/28/2015 14:25:23 Sandra Massey ([id number]) -------------------------------------------------------------------------------- SuperBill Details Patient Name: Sandra Massey Date of Service: 07/28/2015 Medical Record Number: [id number] Patient Account Number: [id number] Date of Birth/Sex: [birth year] (64 y.o. 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 Bitten or stung by nonvenomous insect and other nonvenomous arthropods, initial W57.Merril Abbe encounter Facility Procedures CPT4 Code: [id number] Description: [id number] - WOUND CARE VISIT-LEV 2 EST PT Modifier: Quantity: 1 Physician Procedures CPT4: Description Modifier Quantity Code [id number] - WC PHYS LEVEL 2 - EST PT 1 ICD-10 Description Diagnosis E11.622 Type 2 diabetes mellitus 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

## 2015-07-29 NOTE — Progress Notes (Addendum)
Sandra Massey, Sandra Massey ([id number]) Visit Report for 07/28/2015 Arrival Information Details Patient Name: Sandra Massey, Sandra Massey Date of Service: 07/28/2015 2:15 PM Medical Record Number: [id number] Patient Account Number: [id number] Date of Birth/Sex: 05/06/1950 (64 y.o. Female) Treating RN: Carolyne Fiscal, Debi Primary Care Physician: [name] Other Clinician: Referring Physician: [name] Treating Physician/Extender: Frann Rider in Treatment: 3 Visit Information History Since Last Visit All ordered tests and consults were completed: No Patient Arrived: Ambulatory Added or deleted any medications: No Arrival Time: 14:05 Any new allergies or adverse reactions: No Accompanied By: sister Had a fall or experienced change in No Transfer Assistance: None activities of daily living that may affect Patient Identification Verified: Yes risk of falls: Secondary Verification Process Yes Signs or symptoms of abuse/neglect since last No Completed: visito Patient Requires Transmission- No Hospitalized since last visit: No Based Precautions: Pain Present Now: No Patient Has Alerts: Yes Patient Alerts: Patient on Blood Thinner ASA Electronic Signature(s) Signed: 07/28/2015 4:07:37 PM By: Alric Quan Entered By: Alric Quan on 07/28/2015 14:06:04 Sandra Massey ([id number]) -------------------------------------------------------------------------------- Clinic Level of Care Assessment Details Patient Name: Sandra Massey Date of Service: 07/28/2015 2:15 PM Medical Record Number: [id number] Patient Account Number: [id number] Date of Birth/Sex: 03-Dec-1950 (64 y.o. Female) Treating RN: Carolyne Fiscal, Debi Primary Care Physician: [name] Other Clinician: Referring Physician: [name] Treating Physician/Extender: Frann Rider in Treatment: 3 Clinic Level of Care Assessment Items TOOL 4 Quantity Score X - Use when only an EandM is performed on FOLLOW-UP visit 1  0 ASSESSMENTS - Nursing Assessment / Reassessment X - Reassessment of Co-morbidities (includes updates in patient status) 1 10 X - Reassessment of Adherence to Treatment Plan 1 5 ASSESSMENTS - Wound and Skin Assessment / Reassessment X - Simple Wound Assessment / Reassessment - one wound 1 5 []  - Complex Wound Assessment / Reassessment - multiple wounds 0 []  - Dermatologic / Skin Assessment (not related to wound area) 0 ASSESSMENTS - Focused Assessment []  - Circumferential Edema Measurements - multi extremities 0 []  - Nutritional Assessment / Counseling / Intervention 0 []  - Lower Extremity Assessment (monofilament, tuning fork, pulses) 0 []  - Peripheral Arterial Disease Assessment (using hand held doppler) 0 ASSESSMENTS - Ostomy and/or Continence Assessment and Care []  - Incontinence Assessment and Management 0 []  - Ostomy Care Assessment and Management (repouching, etc.) 0 PROCESS - Coordination of Care X - Simple Patient / Family Education for ongoing care 1 15 []  - Complex (extensive) Patient / Family Education for ongoing care 0 []  - Staff obtains Programmer, systems, Records, Test Results / Process Orders 0 []  - Staff telephones HHA, Nursing Homes / Clarify orders / etc 0 []  - Routine Transfer to another Facility (non-emergent condition) 0 HOLIDAY, [name] ([id number]) []  - Routine Hospital Admission (non-emergent condition) 0 []  - New Admissions / Biomedical engineer / Ordering NPWT, Apligraf, etc. 0 []  - Emergency Hospital Admission (emergent condition) 0 X - Simple Discharge Coordination 1 10 []  - Complex (extensive) Discharge Coordination 0 PROCESS - Special Needs []  - Pediatric / Minor Patient Management 0 []  - Isolation Patient Management 0 []  - Hearing / Language / Visual special needs 0 []  - Assessment of Community assistance (transportation, D/C planning, etc.) 0 []  - Additional assistance / Altered mentation 0 []  - Support Surface(s) Assessment (bed, cushion, seat, etc.)  0 INTERVENTIONS - Wound Cleansing / Measurement X - Simple Wound Cleansing - one wound 1 5 []  - Complex Wound Cleansing - multiple wounds 0 X - Wound Imaging (photographs -  any number of wounds) 1 5 []  - Wound Tracing (instead of photographs) 0 []  - Simple Wound Measurement - one wound 0 []  - Complex Wound Measurement - multiple wounds 0 INTERVENTIONS - Wound Dressings []  - Small Wound Dressing one or multiple wounds 0 []  - Medium Wound Dressing one or multiple wounds 0 []  - Large Wound Dressing one or multiple wounds 0 []  - Application of Medications - topical 0 []  - Application of Medications - injection 0 INTERVENTIONS - Miscellaneous []  - External ear exam 0 [name], [name] ([id number]) []  - Specimen Collection (cultures, biopsies, blood, body fluids, etc.) 0 []  - Specimen(s) / Culture(s) sent or taken to Lab for analysis 0 []  - Patient Transfer (multiple staff / Harrel Lemon Lift / Similar devices) 0 []  - Simple Staple / Suture removal (25 or less) 0 []  - Complex Staple / Suture removal (26 or more) 0 []  - Hypo / Hyperglycemic Management (close monitor of Blood Glucose) 0 []  - Ankle / Brachial Index (ABI) - do not check if billed separately 0 X - Vital Signs 1 5 Has the patient been seen at the hospital within the last three years: Yes Total Score: 60 Level Of Care: New/Established - Level 2 Electronic Signature(s) Signed: 07/28/2015 4:07:37 PM By: Alric Quan Entered By: Alric Quan on 07/28/2015 14:29:43 Sandra Massey ([id number]) -------------------------------------------------------------------------------- Encounter Discharge Information Details Patient Name: Sandra Massey Date of Service: 07/28/2015 2:15 PM Medical Record Number: [id number] Patient Account Number: [id number] Date of Birth/Sex: 1950-04-22 (64 y.o. Female) Treating RN: Carolyne Fiscal, Debi Primary Care Physician: [name] Other Clinician: Referring Physician: [name] Treating  Physician/Extender: Frann Rider in Treatment: 3 Encounter Discharge Information Items Discharge Pain Level: 0 Discharge Condition: Stable Ambulatory Status: Ambulatory Discharge Destination: Home Transportation: Private Auto Accompanied By: sister Schedule Follow-up Appointment: Yes Medication Reconciliation completed and provided to Patient/Care Yes Benjamim Harnish: Provided on Clinical Summary of Care: 07/28/2015 Form Type Recipient Paper Patient SO Electronic Signature(s) Signed: 07/28/2015 2:26:01 PM By: Ruthine Dose Entered By: Ruthine Dose on 07/28/2015 14:26:01 Sandra Massey ([id number]) -------------------------------------------------------------------------------- Lower Extremity Assessment Details Patient Name: Sandra Massey Date of Service: 07/28/2015 2:15 PM Medical Record Number: [id number] Patient Account Number: [id number] Date of Birth/Sex: Mar 07, 1951 (64 y.o. Female) Treating RN: Carolyne Fiscal, Debi Primary Care Physician: [name] Other Clinician: Referring Physician: [name] Treating Physician/Extender: Frann Rider in Treatment: 3 Vascular Assessment Pulses: Posterior Tibial Dorsalis Pedis Palpable: [Right:Yes] Extremity colors, hair growth, and conditions: Extremity Color: [Right:Normal] Temperature of Extremity: [Right:Warm] Capillary Refill: [Right:< 3 seconds] Electronic Signature(s) Signed: 07/28/2015 4:07:37 PM By: Alric Quan Entered By: Alric Quan on 07/28/2015 14:09:18 Sandra Massey ([id number]) -------------------------------------------------------------------------------- Multi Wound Chart Details Patient Name: Sandra Massey Date of Service: 07/28/2015 2:15 PM Medical Record Number: [id number] Patient Account Number: [id number] Date of Birth/Sex: January 30, 1951 (64 y.o. Female) Treating RN: Carolyne Fiscal, Debi Primary Care Physician: [name] Other Clinician: Referring Physician: [name] Treating  Physician/Extender: Frann Rider in Treatment: 3 Vital Signs Height(in): 65 Pulse(bpm): 84 Weight(lbs): 216 Blood Pressure 153/104 (mmHg): Body Mass Index(BMI): 36 Temperature(F): 98.3 Respiratory Rate 18 (breaths/min): Photos: [1:No Photos] [N/A:N/A] Wound Location: [1:Left, Anterior Lower Leg] [N/A:N/A] Wounding Event: [1:Bite] [N/A:N/A] Primary Etiology: [1:Diabetic Wound/Ulcer of the Lower Extremity] [N/A:N/A] Date Acquired: [1:06/27/2015] [N/A:N/A] Weeks of Treatment: [1:3] [N/A:N/A] Wound Status: [1:Healed - Epithelialized] [N/A:N/A] Measurements L x W x D 0x0x0 [N/A:N/A] (cm) Area (cm) : [1:0] [N/A:N/A] Volume (cm) : [1:0] [N/A:N/A] % Reduction in Area: [1:100.00%] [N/A:N/A] % Reduction in  Volume: 100.00% [N/A:N/A] Classification: [1:Grade 1] [N/A:N/A] Periwound Skin Texture: No Abnormalities Noted [N/A:N/A] Periwound Skin [1:No Abnormalities Noted] [N/A:N/A] Moisture: Periwound Skin Color: No Abnormalities Noted [N/A:N/A] Tenderness on [1:No] [N/A:N/A] Treatment Notes Electronic Signature(s) Signed: 07/28/2015 4:07:37 PM By: Alric Quan Entered By: Alric Quan on 07/28/2015 14:20:59 Sandra Massey ([id number]) Sandra Massey ([id number]) -------------------------------------------------------------------------------- [place] Details Patient Name: Sandra Massey Date of Service: 07/28/2015 2:15 PM Medical Record Number: [id number] Patient Account Number: [id number] Date of Birth/Sex: Jul 26, 1950 (65 y.o. Female) Treating RN: Carolyne Fiscal, Debi Primary Care Physician: [name] Other Clinician: Referring Physician: [name] Treating Physician/Extender: Frann Rider in Treatment: 3 Active Inactive Electronic Signature(s) Signed: 07/28/2015 4:07:37 PM By: Alric Quan Entered By: Alric Quan on 07/28/2015 14:20:52 Sandra Massey  ([id number]) -------------------------------------------------------------------------------- Pain Assessment Details Patient Name: Sandra Massey Date of Service: 07/28/2015 2:15 PM Medical Record Number: [id number] Patient Account Number: [id number] Date of Birth/Sex: February 02, 1951 (65 y.o. Female) Treating RN: Carolyne Fiscal, Debi Primary Care Physician: [name] Other Clinician: Referring Physician: [name] Treating Physician/Extender: Frann Rider in Treatment: 3 Active Problems Location of Pain Severity and Description of Pain Patient Has Paino No Site Locations Pain Management and Medication Current Pain Management: Electronic Signature(s) Signed: 07/28/2015 4:07:37 PM By: Alric Quan Entered By: Alric Quan on 07/28/2015 14:06:11 Sandra Massey ([id number]) -------------------------------------------------------------------------------- Patient/Caregiver Education Details Patient Name: Sandra Massey Date of Service: 07/28/2015 2:15 PM Medical Record Number: [id number] Patient Account Number: [id number] Date of Birth/Gender: [birth year] (64 y.o. Female) Treating RN: Carolyne Fiscal, Debi Primary Care Physician: [name] Other Clinician: Referring Physician: [name] Treating Physician/Extender: Frann Rider in Treatment: 3 Education Assessment Education Provided To: Patient Education Topics Provided Wound/Skin Impairment: Handouts: Other: Keep new skin protected. Call us if you need anything or have any questions. Methods: Demonstration, Explain/Verbal Responses: State content correctly Electronic Signature(s) Signed: 07/28/2015 4:07:37 PM By: Alric Quan Entered By: Alric Quan on 07/28/2015 14:22:48 Sandra Massey ([id number]) -------------------------------------------------------------------------------- Wound Assessment Details Patient Name: Sandra Massey Date of Service: 07/28/2015 2:15 PM Medical Record Number:  [id number] Patient Account Number: [id number] Date of Birth/Sex: 07/22/1950 (65 y.o. Female) Treating RN: Carolyne Fiscal, Debi Primary Care Physician: [name] Other Clinician: Referring Physician: [name] Treating Physician/Extender: Frann Rider in Treatment: 3 Wound Status Wound Number: 1 Primary Diabetic Wound/Ulcer of the Lower Etiology: Extremity Wound Location: Left, Anterior Lower Leg Wound Status: Healed - Epithelialized Wounding Event: Bite Date Acquired: 06/27/2015 Weeks Of Treatment: 3 Clustered Wound: No Photos Photo Uploaded By: Alric Quan on 07/28/2015 15:53:14 Wound Measurements Length: (cm) 0 % Reduction i Width: (cm) 0 % Reduction i Depth: (cm) 0 Area: (cm) 0 Volume: (cm) 0 n Area: 100% n Volume: 100% Wound Description Classification: Grade 1 Periwound Skin Texture Texture Color No Abnormalities Noted: No No Abnormalities Noted: No Moisture No Abnormalities Noted: No Electronic Signature(s) Signed: 07/28/2015 4:07:37 PM By: Burlene Arnt ([id number]) Entered By: Alric Quan on 07/28/2015 14:20:28 Sandra Massey ([id number]) -------------------------------------------------------------------------------- Vitals Details Patient Name: Sandra Massey Date of Service: 07/28/2015 2:15 PM Medical Record Number: [id number] Patient Account Number: [id number] Date of Birth/Sex: [birth year] (65 y.o. Female) Treating RN: Carolyne Fiscal, Debi Primary Care Physician: [name] Other Clinician: Referring Physician: [name] Treating Physician/Extender: Frann Rider in Treatment: 3 Vital Signs Time Taken: 14:06 Temperature (F): 98.3 Height (in): 65 Pulse (bpm): 84 Weight (lbs): 216 Respiratory Rate (breaths/min): 18 Body Mass Index (BMI): 35.9 Blood Pressure (mmHg): 153/104 Reference Range: 80 -  120 mg / dl Notes Pts PCP is aware of pts elevated BP and has changed her BP medication. Electronic  Signature(s) Signed: 07/28/2015 4:07:37 PM By: Alric Quan Entered By: Alric Quan on 07/28/2015 14:07:46

## 2016-01-29 ENCOUNTER — Other Ambulatory Visit: Payer: Self-pay | Admitting: Internal Medicine

## 2016-01-29 DIAGNOSIS — Z1239 Encounter for other screening for malignant neoplasm of breast: Secondary | ICD-10-CM

## 2016-03-14 ENCOUNTER — Ambulatory Visit: Payer: Medicare HMO

## 2016-06-27 ENCOUNTER — Ambulatory Visit
Admission: RE | Admit: 2016-06-27 | Discharge: 2016-06-27 | Disposition: A | Discharge status: Home / Self Care | Payer: Medicare HMO | Source: Ambulatory Visit | Attending: Internal Medicine | Admitting: Internal Medicine

## 2016-06-27 DIAGNOSIS — Z1239 Encounter for other screening for malignant neoplasm of breast: Secondary | ICD-10-CM

## 2016-06-27 DIAGNOSIS — Z1231 Encounter for screening mammogram for malignant neoplasm of breast: Secondary | ICD-10-CM | POA: Diagnosis not present

## 2018-03-26 ENCOUNTER — Encounter: Payer: Self-pay | Admitting: Podiatry

## 2018-03-26 ENCOUNTER — Ambulatory Visit: Payer: Medicare HMO | Admitting: Podiatry

## 2018-03-26 DIAGNOSIS — L84 Corns and callosities: Secondary | ICD-10-CM | POA: Diagnosis not present

## 2018-03-26 DIAGNOSIS — E11628 Type 2 diabetes mellitus with other skin complications: Secondary | ICD-10-CM | POA: Diagnosis not present

## 2018-03-26 DIAGNOSIS — B351 Tinea unguium: Secondary | ICD-10-CM

## 2018-03-26 DIAGNOSIS — M79675 Pain in left toe(s): Secondary | ICD-10-CM

## 2018-03-26 DIAGNOSIS — M79674 Pain in right toe(s): Secondary | ICD-10-CM | POA: Diagnosis not present

## 2018-03-26 NOTE — Progress Notes (Signed)
This patient presents to the office with chief complaint of a callus that has developed on the tip of her big toe right foot which is blackened.  She states that she noticed this site approximately 1 week ago.  She was concerned about this lesion since she is diabetic.  She also says that she has long thick nails which she is unable to self treat.  She presents the office today for an evaluation and treatment of her feet.    General Appearance  Alert, conversant and in no acute stress.  Vascular  Dorsalis pedis and posterior tibial  pulses are minimally  palpable  bilaterally.  Capillary return is within normal limits  bilaterally. Temperature is within normal limits  bilaterally.  Neurologic  Senn-Weinstein monofilament wire test within normal limits  bilaterally. Muscle power within normal limits bilaterally.  Nails Thick disfigured discolored nails with subungual debris  from hallux to fifth toes left foot.  Thick disfigured discolored nails 4,5 right foot.. No evidence of bacterial infection or drainage bilaterally.  Orthopedic  No limitations of motion  feet .  No crepitus or effusions noted.  No bony pathology or digital deformities noted.  Skin  normotropic skin with no porokeratosis noted bilaterally.  No signs of infections or ulcers noted.  Hemorrhagic callus distal aspect right hallux. No redness or infection noted.  Hemorrhagic pre-ulcerous callus right foot.  Onychomycosis  X 7  B/l.  Debride callus right hallux.  Debride nails.  RTC prn.   Gardiner Barefoot DPM

## 2018-05-14 ENCOUNTER — Other Ambulatory Visit: Payer: Self-pay | Admitting: Internal Medicine

## 2018-05-14 DIAGNOSIS — Z1231 Encounter for screening mammogram for malignant neoplasm of breast: Secondary | ICD-10-CM

## 2018-06-22 ENCOUNTER — Encounter: Payer: Self-pay | Admitting: *Deleted

## 2018-08-21 ENCOUNTER — Other Ambulatory Visit
Admission: RE | Admit: 2018-08-21 | Discharge: 2018-08-21 | Disposition: A | Discharge status: Home / Self Care | Payer: Medicare HMO | Source: Ambulatory Visit | Attending: Internal Medicine | Admitting: Internal Medicine

## 2018-08-21 DIAGNOSIS — I6789 Other cerebrovascular disease: Secondary | ICD-10-CM | POA: Diagnosis not present

## 2018-08-21 DIAGNOSIS — I4891 Unspecified atrial fibrillation: Secondary | ICD-10-CM | POA: Insufficient documentation

## 2018-08-21 DIAGNOSIS — F329 Major depressive disorder, single episode, unspecified: Secondary | ICD-10-CM | POA: Insufficient documentation

## 2018-08-21 DIAGNOSIS — E559 Vitamin D deficiency, unspecified: Secondary | ICD-10-CM | POA: Insufficient documentation

## 2018-08-21 DIAGNOSIS — E1165 Type 2 diabetes mellitus with hyperglycemia: Secondary | ICD-10-CM | POA: Diagnosis present

## 2018-08-21 DIAGNOSIS — E785 Hyperlipidemia, unspecified: Secondary | ICD-10-CM | POA: Diagnosis not present

## 2018-08-21 LAB — BASIC METABOLIC PANEL WITH GFR
Anion gap: 12 (ref 5–15)
BUN: 21 mg/dL (ref 8–23)
CO2: 26 mmol/L (ref 22–32)
Calcium: 9.5 mg/dL (ref 8.9–10.3)
Chloride: 101 mmol/L (ref 98–111)
Creatinine, Ser: 0.75 mg/dL (ref 0.44–1.00)
GFR calc Af Amer: >60 mL/min
GFR calc non Af Amer: >60 mL/min
Glucose, Bld: 228 mg/dL — ABNORMAL HIGH (ref 70–99)
Potassium: 4.1 mmol/L (ref 3.5–5.1)
Sodium: 139 mmol/L (ref 135–145)

## 2018-08-21 LAB — HEMOGLOBIN A1C
Hgb A1c MFr Bld: 11.3 % — ABNORMAL HIGH (ref 4.8–5.6)
Mean Plasma Glucose: 277.61 mg/dL

## 2018-11-03 ENCOUNTER — Other Ambulatory Visit: Payer: Self-pay | Admitting: Registered Nurse

## 2018-11-03 ENCOUNTER — Other Ambulatory Visit (HOSPITAL_COMMUNITY): Payer: Self-pay | Admitting: Registered Nurse

## 2018-11-03 DIAGNOSIS — M79604 Pain in right leg: Secondary | ICD-10-CM

## 2018-11-03 DIAGNOSIS — M79605 Pain in left leg: Secondary | ICD-10-CM

## 2018-11-03 DIAGNOSIS — M7989 Other specified soft tissue disorders: Secondary | ICD-10-CM

## 2018-11-04 ENCOUNTER — Other Ambulatory Visit: Payer: Self-pay

## 2018-11-04 ENCOUNTER — Ambulatory Visit
Admission: RE | Admit: 2018-11-04 | Discharge: 2018-11-04 | Disposition: A | Discharge status: Home / Self Care | Payer: Medicare HMO | Source: Ambulatory Visit | Attending: Registered Nurse | Admitting: Registered Nurse

## 2018-11-04 DIAGNOSIS — M79605 Pain in left leg: Secondary | ICD-10-CM | POA: Diagnosis present

## 2018-11-04 DIAGNOSIS — M79604 Pain in right leg: Secondary | ICD-10-CM | POA: Insufficient documentation

## 2018-11-06 ENCOUNTER — Ambulatory Visit
Admission: RE | Admit: 2018-11-06 | Discharge: 2018-11-06 | Disposition: A | Discharge status: Home / Self Care | Payer: Medicare HMO | Source: Ambulatory Visit | Attending: Registered Nurse | Admitting: Registered Nurse

## 2018-11-06 ENCOUNTER — Other Ambulatory Visit: Payer: Self-pay

## 2018-11-06 DIAGNOSIS — M7989 Other specified soft tissue disorders: Secondary | ICD-10-CM

## 2018-11-06 DIAGNOSIS — M79604 Pain in right leg: Secondary | ICD-10-CM | POA: Diagnosis present

## 2018-11-06 DIAGNOSIS — M79605 Pain in left leg: Secondary | ICD-10-CM | POA: Insufficient documentation

## 2019-03-17 ENCOUNTER — Emergency Department: Payer: Medicare Other

## 2019-03-17 ENCOUNTER — Encounter: Payer: Self-pay | Admitting: Emergency Medicine

## 2019-03-17 ENCOUNTER — Emergency Department
Admission: EM | Admit: 2019-03-17 | Discharge: 2019-03-17 | Disposition: A | Discharge status: Home / Self Care | Payer: Medicare Other | Attending: Emergency Medicine | Admitting: Emergency Medicine

## 2019-03-17 ENCOUNTER — Other Ambulatory Visit: Payer: Self-pay

## 2019-03-17 DIAGNOSIS — E119 Type 2 diabetes mellitus without complications: Secondary | ICD-10-CM | POA: Insufficient documentation

## 2019-03-17 DIAGNOSIS — Y929 Unspecified place or not applicable: Secondary | ICD-10-CM | POA: Insufficient documentation

## 2019-03-17 DIAGNOSIS — R1012 Left upper quadrant pain: Secondary | ICD-10-CM | POA: Insufficient documentation

## 2019-03-17 DIAGNOSIS — W109XXA Fall (on) (from) unspecified stairs and steps, initial encounter: Secondary | ICD-10-CM | POA: Diagnosis not present

## 2019-03-17 DIAGNOSIS — Y999 Unspecified external cause status: Secondary | ICD-10-CM | POA: Insufficient documentation

## 2019-03-17 DIAGNOSIS — S8001XA Contusion of right knee, initial encounter: Secondary | ICD-10-CM | POA: Insufficient documentation

## 2019-03-17 DIAGNOSIS — Z7901 Long term (current) use of anticoagulants: Secondary | ICD-10-CM | POA: Insufficient documentation

## 2019-03-17 DIAGNOSIS — W19XXXA Unspecified fall, initial encounter: Secondary | ICD-10-CM

## 2019-03-17 DIAGNOSIS — Y9389 Activity, other specified: Secondary | ICD-10-CM | POA: Diagnosis not present

## 2019-03-17 DIAGNOSIS — S0990XA Unspecified injury of head, initial encounter: Secondary | ICD-10-CM | POA: Diagnosis present

## 2019-03-17 DIAGNOSIS — I10 Essential (primary) hypertension: Secondary | ICD-10-CM | POA: Diagnosis not present

## 2019-03-17 DIAGNOSIS — Z7982 Long term (current) use of aspirin: Secondary | ICD-10-CM | POA: Diagnosis not present

## 2019-03-17 LAB — COMPREHENSIVE METABOLIC PANEL
ALT: 17 U/L (ref 0–44)
AST: 20 U/L (ref 15–41)
Albumin: 3.8 g/dL (ref 3.5–5.0)
Alkaline Phosphatase: 82 U/L (ref 38–126)
Anion gap: 12 (ref 5–15)
BUN: 18 mg/dL (ref 8–23)
CO2: 24 mmol/L (ref 22–32)
Calcium: 9.5 mg/dL (ref 8.9–10.3)
Chloride: 100 mmol/L (ref 98–111)
Creatinine, Ser: 0.9 mg/dL (ref 0.44–1.00)
GFR calc Af Amer: >60 mL/min (ref >60)
GFR calc non Af Amer: >60 mL/min (ref >60)
Glucose, Bld: 318 mg/dL — ABNORMAL HIGH (ref 70–99)
Potassium: 4.5 mmol/L (ref 3.5–5.1)
Sodium: 136 mmol/L (ref 135–145)
Total Bilirubin: 0.6 mg/dL (ref 0.3–1.2)
Total Protein: 7.3 g/dL (ref 6.5–8.1)

## 2019-03-17 LAB — CBC WITH DIFFERENTIAL/PLATELET
Abs Immature Granulocytes: 0.04 10*3/uL (ref 0.00–0.07)
Basophils Absolute: 0.1 10*3/uL (ref 0.0–0.1)
Basophils Relative: 1 %
Eosinophils Absolute: 0.1 10*3/uL (ref 0.0–0.5)
Eosinophils Relative: 1 %
HCT: 40.4 % (ref 36.0–46.0)
Hemoglobin: 13.2 g/dL (ref 12.0–15.0)
Immature Granulocytes: 0 %
Lymphocytes Relative: 15 %
Lymphs Abs: 1.4 10*3/uL (ref 0.7–4.0)
MCH: 29 pg (ref 26.0–34.0)
MCHC: 32.7 g/dL (ref 30.0–36.0)
MCV: 88.8 fL (ref 80.0–100.0)
Monocytes Absolute: 0.4 10*3/uL (ref 0.1–1.0)
Monocytes Relative: 4 %
Neutro Abs: 7.4 10*3/uL (ref 1.7–7.7)
Neutrophils Relative %: 79 %
Platelets: 288 10*3/uL (ref 150–400)
RBC: 4.55 MIL/uL (ref 3.87–5.11)
RDW: 13.2 % (ref 11.5–15.5)
WBC: 9.3 10*3/uL (ref 4.0–10.5)
nRBC: 0 % (ref 0.0–0.2)

## 2019-03-17 MED ORDER — IOHEXOL 300 MG/ML  SOLN
100.0000 mL | Freq: Once | INTRAMUSCULAR | Status: AC | PRN
  Administered 2019-03-17: 15:00:00 100 mL via INTRAVENOUS
  Filled 2019-03-17: qty 100

## 2019-03-17 NOTE — ED Notes (Signed)
Xray completed, pt in bed, call light in reach, daughter at bedside.

## 2019-03-17 NOTE — ED Triage Notes (Addendum)
Patient reports she fell walking up the steps. Hematoma noted to right side of face. Patient denies LOC. On Eloquis for previous CVA. Also complaining of pain in right leg but unable to specify where. Moving leg freely in triage.   Patient with expressive aphasia and right side deficits from previous CVA.

## 2019-03-17 NOTE — ED Triage Notes (Signed)
Pt in via EMS from home with c/o mechanical fall and now hematoma on her forehead above her right eye. Pt also on eloquis. Pt with right side deficit from previous CVA and has limited movement   #18 left ac, FSBS 395

## 2019-03-17 NOTE — ED Provider Notes (Signed)
[facility] Emergency Department Provider Note   ____________________________________________    I have reviewed the triage vital signs and the nursing notes.   HISTORY  Chief Complaint Fall     HPI Sandra Massey is a 69 y.o. female with history of diabetes, hypertension, prior CVA leaving her with right-sided deficits.  Patient was apparently making her way of some steps and lost her balance and fell forward at the top of the steps.  Complains of right knee pain and head injury.  She is reportedly on Eliquis as well.  She also describes some left upper quadrant abdominal pain.  No difficulty breathing.  No nausea or vomiting.  No hip pain or pelvic pain.  No back pain  Past Medical History:  Diagnosis Date  . Arthritis   . Diabetes mellitus without complication ([place])   . Hypertension   . Substance abuse ([place])     There are no problems to display for this patient.   Past Surgical History:  Procedure Laterality Date  . BREAST BIOPSY Right    sebaceous cyst removed    Prior to Admission medications   Medication Sig Start Date End Date Taking? Authorizing Provider  aspirin 325 MG tablet Take 325 mg by mouth daily.    [provider]  calcium carbonate (OS-CAL) 600 MG TABS tablet Take 600 mg by mouth daily.    [provider]  carisoprodol (SOMA) 350 MG tablet Take 1 tablet (350 mg total) by mouth 3 (three) times daily. 12/21/14   Triplett, Johnette Abraham B, FNP  cholecalciferol (VITAMIN D) 1000 UNITS tablet Take 1,000 Units by mouth daily.    [provider]  escitalopram (LEXAPRO) 10 MG tablet Take 10 mg by mouth daily.    [provider]  glipiZIDE (GLUCOTROL) 5 MG tablet Take 5 mg by mouth daily before breakfast.    [provider]  JARDIANCE 25 MG TABS tablet Take 25 mg by mouth daily. 03/17/18   [provider]  lisinopril (PRINIVIL,ZESTRIL) 20 MG tablet Take 20 mg by mouth daily. 03/14/18   [provider]  metFORMIN (GLUCOPHAGE) 1000 MG tablet Take 1,000 mg by mouth 2 (two) times daily with a meal.    [provider]  simvastatin (ZOCOR) 40 MG tablet Take 40 mg by mouth every evening.    [provider]     Allergies Latex  No family history on file.  Social History Social History   Tobacco Use  . Smoking status: Never Smoker  . Smokeless tobacco: Never Used  . Tobacco comment: 2007 quit   Substance Use Topics  . Alcohol use: Never  . Drug use: Never    Review of Systems  Constitutional: No fever/chills Eyes: No visual changes.  ENT: No neck pain Cardiovascular: Denies chest pain. Respiratory: Denies shortness of breath. Gastrointestinal: As above Genitourinary: Negative for dysuria. Musculoskeletal: As above Skin: Negative for laceration Neurological: No new weakness   ____________________________________________   PHYSICAL EXAM:  VITAL SIGNS: ED Triage Vitals  Enc Vitals Group     BP --      Pulse Rate 03/17/19 1236 (!) 52     Resp 03/17/19 1236 16     Temp 03/17/19 1236 98.2 F (36.8 C)     Temp Source 03/17/19 1236 Oral     SpO2 03/17/19 1236 94 %     Weight --      Height --      Head Circumference --  Peak Flow --      Pain Score 03/17/19 1237 5     Pain Loc --      Pain Edu? --      Excl. in [place]? --     Constitutional: Alert and oriented.  Eyes: Conjunctivae are normal.  Head: Hematoma right forehead, nonbleeding Nose: No swelling or epistaxis Mouth/Throat: Mucous membranes are moist.   Neck:  Painless ROM, no vertebral tenderness palpation Cardiovascular: Normal rate, regular rhythm.  Good peripheral circulation.  No chest wall tenderness palpation Respiratory: Normal respiratory effort.  No retractions. Lungs CTAB. Gastrointestinal: Mild tenderness left upper quadrant, no distention, no CVA tenderness  Musculoskeletal: Mild swelling to the right knee, normal range of motion with some discomfort.  No  pain with axial load on both hips.  Normal range of motion of the left arm, right arm is chronically weak.  No vertebral tenderness palpation Neurologic:  Normal speech and language. No gross focal neurologic deficits are appreciated.  Skin:  Skin is warm, dry and intact. No rash noted. Psychiatric: Mood and affect are normal. Speech and behavior are normal.  ____________________________________________   LABS (all labs ordered are listed, but only abnormal results are displayed)  Labs Reviewed  COMPREHENSIVE METABOLIC PANEL - Abnormal; Notable for the following components:      Result Value   Glucose, Bld 318 (*)    All other components within normal limits  CBC WITH DIFFERENTIAL/PLATELET   ____________________________________________  EKG  None ____________________________________________  RADIOLOGY  CT head max face cervical spine without acute abnormality ____________________________________________   PROCEDURES  Procedure(s) performed: No  Procedures   Critical Care performed: No ____________________________________________   INITIAL IMPRESSION / ASSESSMENT AND PLAN / ED COURSE  Pertinent labs & imaging results that were available during my care of the patient were reviewed by me and considered in my medical decision making (see chart for details).  Patient overall well-appearing in no acute distress.  On blood thinners, fell forward onto her face.  CT imaging of the head max face, cervical spine is reassuring.  She also has some discomfort to the left upper quadrant, given that she is on Eliquis we will obtain imaging of the abdomen as well.  Pending x-ray of her right knee  Knee x-ray is reassuring  Pending CT abdomen pelvis.  Lab work is unremarkable.  CT abdomen pelvis is reassuring, patient appropriate for discharge at this time    ____________________________________________   FINAL CLINICAL IMPRESSION(S) / ED DIAGNOSES  Final diagnoses:  Fall,  initial encounter  Injury of head, initial encounter  Contusion of right knee, initial encounter        Note:  This document was prepared using Dragon voice recognition software and may include unintentional dictation errors.   Lavonia Drafts, MD 03/17/19 1640

## 2019-05-31 ENCOUNTER — Other Ambulatory Visit: Payer: Self-pay

## 2019-05-31 ENCOUNTER — Emergency Department: Payer: Medicare HMO

## 2019-05-31 ENCOUNTER — Encounter: Payer: Self-pay | Admitting: Emergency Medicine

## 2019-05-31 ENCOUNTER — Inpatient Hospital Stay
Admission: EM | Admit: 2019-05-31 | Discharge: 2019-06-07 | DRG: 637 | Disposition: A | Discharge status: Home Health | Payer: Medicare HMO | Attending: Internal Medicine | Admitting: Internal Medicine

## 2019-05-31 DIAGNOSIS — Z7982 Long term (current) use of aspirin: Secondary | ICD-10-CM

## 2019-05-31 DIAGNOSIS — N179 Acute kidney failure, unspecified: Secondary | ICD-10-CM | POA: Diagnosis not present

## 2019-05-31 DIAGNOSIS — Z20822 Contact with and (suspected) exposure to covid-19: Secondary | ICD-10-CM | POA: Diagnosis present

## 2019-05-31 DIAGNOSIS — N390 Urinary tract infection, site not specified: Secondary | ICD-10-CM | POA: Diagnosis not present

## 2019-05-31 DIAGNOSIS — A419 Sepsis, unspecified organism: Secondary | ICD-10-CM | POA: Diagnosis not present

## 2019-05-31 DIAGNOSIS — E1110 Type 2 diabetes mellitus with ketoacidosis without coma: Principal | ICD-10-CM | POA: Diagnosis present

## 2019-05-31 DIAGNOSIS — L89152 Pressure ulcer of sacral region, stage 2: Secondary | ICD-10-CM | POA: Diagnosis present

## 2019-05-31 DIAGNOSIS — E872 Acidosis, unspecified: Secondary | ICD-10-CM

## 2019-05-31 DIAGNOSIS — Z6833 Body mass index (BMI) 33.0-33.9, adult: Secondary | ICD-10-CM

## 2019-05-31 DIAGNOSIS — Z79899 Other long term (current) drug therapy: Secondary | ICD-10-CM

## 2019-05-31 DIAGNOSIS — R0902 Hypoxemia: Secondary | ICD-10-CM | POA: Diagnosis not present

## 2019-05-31 DIAGNOSIS — E785 Hyperlipidemia, unspecified: Secondary | ICD-10-CM | POA: Diagnosis present

## 2019-05-31 DIAGNOSIS — R4189 Other symptoms and signs involving cognitive functions and awareness: Secondary | ICD-10-CM | POA: Diagnosis present

## 2019-05-31 DIAGNOSIS — R6521 Severe sepsis with septic shock: Secondary | ICD-10-CM | POA: Diagnosis not present

## 2019-05-31 DIAGNOSIS — E871 Hypo-osmolality and hyponatremia: Secondary | ICD-10-CM | POA: Diagnosis present

## 2019-05-31 DIAGNOSIS — E878 Other disorders of electrolyte and fluid balance, not elsewhere classified: Secondary | ICD-10-CM | POA: Diagnosis present

## 2019-05-31 DIAGNOSIS — R531 Weakness: Secondary | ICD-10-CM | POA: Diagnosis not present

## 2019-05-31 DIAGNOSIS — E860 Dehydration: Secondary | ICD-10-CM | POA: Diagnosis present

## 2019-05-31 DIAGNOSIS — N170 Acute kidney failure with tubular necrosis: Secondary | ICD-10-CM | POA: Diagnosis present

## 2019-05-31 DIAGNOSIS — I361 Nonrheumatic tricuspid (valve) insufficiency: Secondary | ICD-10-CM | POA: Diagnosis not present

## 2019-05-31 DIAGNOSIS — N3001 Acute cystitis with hematuria: Secondary | ICD-10-CM | POA: Diagnosis present

## 2019-05-31 DIAGNOSIS — L8990 Pressure ulcer of unspecified site, unspecified stage: Secondary | ICD-10-CM | POA: Insufficient documentation

## 2019-05-31 DIAGNOSIS — N201 Calculus of ureter: Secondary | ICD-10-CM | POA: Diagnosis present

## 2019-05-31 DIAGNOSIS — I959 Hypotension, unspecified: Secondary | ICD-10-CM | POA: Diagnosis not present

## 2019-05-31 DIAGNOSIS — Z7984 Long term (current) use of oral hypoglycemic drugs: Secondary | ICD-10-CM

## 2019-05-31 DIAGNOSIS — I10 Essential (primary) hypertension: Secondary | ICD-10-CM | POA: Diagnosis present

## 2019-05-31 DIAGNOSIS — E669 Obesity, unspecified: Secondary | ICD-10-CM | POA: Diagnosis present

## 2019-05-31 DIAGNOSIS — Z7901 Long term (current) use of anticoagulants: Secondary | ICD-10-CM

## 2019-05-31 DIAGNOSIS — R1084 Generalized abdominal pain: Secondary | ICD-10-CM

## 2019-05-31 DIAGNOSIS — I482 Chronic atrial fibrillation, unspecified: Secondary | ICD-10-CM | POA: Diagnosis present

## 2019-05-31 DIAGNOSIS — N200 Calculus of kidney: Secondary | ICD-10-CM | POA: Diagnosis present

## 2019-05-31 DIAGNOSIS — E875 Hyperkalemia: Secondary | ICD-10-CM | POA: Diagnosis present

## 2019-05-31 DIAGNOSIS — R6520 Severe sepsis without septic shock: Secondary | ICD-10-CM | POA: Diagnosis not present

## 2019-05-31 DIAGNOSIS — F1910 Other psychoactive substance abuse, uncomplicated: Secondary | ICD-10-CM | POA: Diagnosis present

## 2019-05-31 DIAGNOSIS — R571 Hypovolemic shock: Secondary | ICD-10-CM | POA: Diagnosis present

## 2019-05-31 DIAGNOSIS — N210 Calculus in bladder: Secondary | ICD-10-CM | POA: Diagnosis present

## 2019-05-31 DIAGNOSIS — Z91040 Latex allergy status: Secondary | ICD-10-CM

## 2019-05-31 DIAGNOSIS — E0865 Diabetes mellitus due to underlying condition with hyperglycemia: Secondary | ICD-10-CM | POA: Diagnosis not present

## 2019-05-31 DIAGNOSIS — I4891 Unspecified atrial fibrillation: Secondary | ICD-10-CM

## 2019-05-31 LAB — COMPREHENSIVE METABOLIC PANEL
ALT: 29 U/L (ref 0–44)
AST: 18 U/L (ref 15–41)
Albumin: 4.2 g/dL (ref 3.5–5.0)
Alkaline Phosphatase: 98 U/L (ref 38–126)
Anion gap: 21 — ABNORMAL HIGH (ref 5–15)
BUN: 84 mg/dL — ABNORMAL HIGH (ref 8–23)
CO2: 17 mmol/L — ABNORMAL LOW (ref 22–32)
Calcium: 9 mg/dL (ref 8.9–10.3)
Chloride: 87 mmol/L — ABNORMAL LOW (ref 98–111)
Creatinine, Ser: 4.01 mg/dL — ABNORMAL HIGH (ref 0.44–1.00)
GFR calc Af Amer: 13 mL/min — ABNORMAL LOW (ref >60)
GFR calc non Af Amer: 11 mL/min — ABNORMAL LOW (ref >60)
Glucose, Bld: 367 mg/dL — ABNORMAL HIGH (ref 70–99)
Potassium: 5.4 mmol/L — ABNORMAL HIGH (ref 3.5–5.1)
Sodium: 125 mmol/L — ABNORMAL LOW (ref 135–145)
Total Bilirubin: 1.1 mg/dL (ref 0.3–1.2)
Total Protein: 8.1 g/dL (ref 6.5–8.1)

## 2019-05-31 LAB — GLUCOSE, CAPILLARY
Glucose-Capillary: 246 mg/dL — ABNORMAL HIGH (ref 70–99)
Glucose-Capillary: 254 mg/dL — ABNORMAL HIGH (ref 70–99)
Glucose-Capillary: 177 mg/dL — ABNORMAL HIGH (ref 70–99)
Glucose-Capillary: 159 mg/dL — ABNORMAL HIGH (ref 70–99)

## 2019-05-31 LAB — URINALYSIS, COMPLETE (UACMP) WITH MICROSCOPIC
Bilirubin Urine: NEGATIVE
Glucose, UA: >=500 mg/dL — AB
Ketones, ur: 5 mg/dL — AB
Nitrite: NEGATIVE
Protein, ur: >=300 mg/dL — AB
Specific Gravity, Urine: 1.018 (ref 1.005–1.030)
Squamous Epithelial / HPF: NONE SEEN (ref 0–5)
WBC, UA: >50 WBC/hpf — ABNORMAL HIGH (ref 0–5)
pH: 5 (ref 5.0–8.0)

## 2019-05-31 LAB — CBC
HCT: 48.3 % — ABNORMAL HIGH (ref 36.0–46.0)
Hemoglobin: 16.4 g/dL — ABNORMAL HIGH (ref 12.0–15.0)
MCH: 29 pg (ref 26.0–34.0)
MCHC: 34 g/dL (ref 30.0–36.0)
MCV: 85.5 fL (ref 80.0–100.0)
Platelets: 368 10*3/uL (ref 150–400)
RBC: 5.65 MIL/uL — ABNORMAL HIGH (ref 3.87–5.11)
RDW: 12.7 % (ref 11.5–15.5)
WBC: 12.4 10*3/uL — ABNORMAL HIGH (ref 4.0–10.5)
nRBC: 0 % (ref 0.0–0.2)

## 2019-05-31 LAB — BETA-HYDROXYBUTYRIC ACID
Beta-Hydroxybutyric Acid: 3.25 mmol/L — ABNORMAL HIGH (ref 0.05–0.27)
Beta-Hydroxybutyric Acid: 0.8 mmol/L — ABNORMAL HIGH (ref 0.05–0.27)

## 2019-05-31 LAB — BASIC METABOLIC PANEL
Anion gap: 13 (ref 5–15)
BUN: 81 mg/dL — ABNORMAL HIGH (ref 8–23)
CO2: 22 mmol/L (ref 22–32)
Calcium: 8.3 mg/dL — ABNORMAL LOW (ref 8.9–10.3)
Chloride: 96 mmol/L — ABNORMAL LOW (ref 98–111)
Creatinine, Ser: 3.73 mg/dL — ABNORMAL HIGH (ref 0.44–1.00)
GFR calc Af Amer: 14 mL/min — ABNORMAL LOW (ref >60)
GFR calc non Af Amer: 12 mL/min — ABNORMAL LOW (ref >60)
Glucose, Bld: 177 mg/dL — ABNORMAL HIGH (ref 70–99)
Potassium: 4.1 mmol/L (ref 3.5–5.1)
Sodium: 131 mmol/L — ABNORMAL LOW (ref 135–145)

## 2019-05-31 LAB — RESPIRATORY PANEL BY RT PCR (FLU A&B, COVID)
Influenza A by PCR: NEGATIVE
Influenza B by PCR: NEGATIVE
SARS Coronavirus 2 by RT PCR: NEGATIVE

## 2019-05-31 LAB — LACTIC ACID, PLASMA: Lactic Acid, Venous: 4.9 mmol/L (ref 0.5–1.9)

## 2019-05-31 LAB — TROPONIN I (HIGH SENSITIVITY)
Troponin I (High Sensitivity): 121 ng/L (ref <18)
Troponin I (High Sensitivity): 110 ng/L (ref <18)

## 2019-05-31 LAB — LIPASE, BLOOD: Lipase: 111 U/L — ABNORMAL HIGH (ref 11–51)

## 2019-05-31 MED ORDER — SODIUM CHLORIDE 0.9 % IV SOLN
2.0000 g | Freq: Once | INTRAVENOUS | Status: AC
  Administered 2019-05-31: 2 g via INTRAVENOUS
  Filled 2019-05-31: qty 2

## 2019-05-31 MED ORDER — INSULIN REGULAR(HUMAN) IN NACL 100-0.9 UT/100ML-% IV SOLN
INTRAVENOUS | Status: DC
  Administered 2019-05-31: 5.5 [IU]/h via INTRAVENOUS
  Administered 2019-05-31: 11.5 [IU]/h via INTRAVENOUS
  Filled 2019-05-31: qty 100

## 2019-05-31 MED ORDER — VITAMIN D 25 MCG (1000 UNIT) PO TABS
1000.0000 [IU] | ORAL_TABLET | Freq: Every day | ORAL | Status: DC
  Administered 2019-05-31 – 2019-06-07 (×8): 1000 [IU] via ORAL
  Filled 2019-05-31 (×8): qty 1

## 2019-05-31 MED ORDER — APIXABAN 5 MG PO TABS
5.0000 mg | ORAL_TABLET | Freq: Two times a day (BID) | ORAL | Status: DC
  Administered 2019-05-31: 5 mg via ORAL
  Filled 2019-05-31: qty 1

## 2019-05-31 MED ORDER — SODIUM CHLORIDE 0.9% FLUSH: 3.0000 mL | Freq: Once | INTRAVENOUS | Status: DC

## 2019-05-31 MED ORDER — ONDANSETRON HCL 4 MG/2ML IJ SOLN
4.0000 mg | Freq: Once | INTRAMUSCULAR | Status: AC
  Administered 2019-05-31: 4 mg via INTRAVENOUS
  Filled 2019-05-31: qty 2

## 2019-05-31 MED ORDER — MORPHINE SULFATE (PF) 2 MG/ML IV SOLN: 2.0000 mg | INTRAVENOUS | Status: DC | PRN

## 2019-05-31 MED ORDER — INSULIN REGULAR(HUMAN) IN NACL 100-0.9 UT/100ML-% IV SOLN: INTRAVENOUS | Status: DC

## 2019-05-31 MED ORDER — MAGNESIUM HYDROXIDE 400 MG/5ML PO SUSP: 30.0000 mL | Freq: Every day | ORAL | Status: DC | PRN

## 2019-05-31 MED ORDER — SODIUM CHLORIDE 0.9 % IV SOLN: INTRAVENOUS | Status: DC

## 2019-05-31 MED ORDER — LACTATED RINGERS IV BOLUS (SEPSIS)
1000.0000 mL | Freq: Once | INTRAVENOUS | Status: AC
  Administered 2019-05-31: 1000 mL via INTRAVENOUS

## 2019-05-31 MED ORDER — ACETAMINOPHEN 325 MG PO TABS: 650.0000 mg | ORAL_TABLET | ORAL | Status: DC | PRN

## 2019-05-31 MED ORDER — LACTATED RINGERS IV BOLUS
1000.0000 mL | Freq: Once | INTRAVENOUS | Status: AC
  Administered 2019-05-31: 1000 mL via INTRAVENOUS

## 2019-05-31 MED ORDER — SOTALOL HCL (AF) 80 MG PO TABS: 1.0000 | ORAL_TABLET | Freq: Every day | ORAL | Status: DC

## 2019-05-31 MED ORDER — SODIUM CHLORIDE 0.9 % IV SOLN
2.0000 g | INTRAVENOUS | Status: DC
  Administered 2019-06-01 – 2019-06-02 (×2): 2 g via INTRAVENOUS
  Filled 2019-05-31: qty 2
  Filled 2019-05-31: qty 20

## 2019-05-31 MED ORDER — DEXTROSE-NACL 5-0.45 % IV SOLN: INTRAVENOUS | Status: DC

## 2019-05-31 MED ORDER — TRAZODONE HCL 50 MG PO TABS
25.0000 mg | ORAL_TABLET | Freq: Every evening | ORAL | Status: DC | PRN
  Administered 2019-06-01 – 2019-06-04 (×3): 25 mg via ORAL
  Filled 2019-05-31 (×4): qty 1

## 2019-05-31 MED ORDER — ONDANSETRON HCL 4 MG/2ML IJ SOLN
4.0000 mg | INTRAMUSCULAR | Status: DC | PRN
  Administered 2019-06-01: 4 mg via INTRAVENOUS
  Filled 2019-05-31: qty 2

## 2019-05-31 MED ORDER — METRONIDAZOLE IN NACL 5-0.79 MG/ML-% IV SOLN
500.0000 mg | Freq: Once | INTRAVENOUS | Status: AC
  Administered 2019-05-31: 500 mg via INTRAVENOUS
  Filled 2019-05-31: qty 100

## 2019-05-31 MED ORDER — DEXTROSE 50 % IV SOLN: 0.0000 mL | INTRAVENOUS | Status: DC | PRN

## 2019-05-31 MED ORDER — FENTANYL CITRATE (PF) 100 MCG/2ML IJ SOLN
50.0000 ug | Freq: Once | INTRAMUSCULAR | Status: AC
  Administered 2019-05-31: 50 ug via INTRAVENOUS
  Filled 2019-05-31: qty 2

## 2019-05-31 MED ORDER — ROSUVASTATIN CALCIUM 20 MG PO TABS
40.0000 mg | ORAL_TABLET | Freq: Every day | ORAL | Status: DC
  Filled 2019-05-31: qty 2

## 2019-05-31 MED ORDER — SODIUM CHLORIDE 0.9 % IV BOLUS: 1000.0000 mL | Freq: Once | INTRAVENOUS | Status: DC

## 2019-05-31 MED ORDER — CALCIUM CARBONATE ANTACID 500 MG PO CHEW
500.0000 mg | CHEWABLE_TABLET | Freq: Every day | ORAL | Status: DC
  Administered 2019-05-31 – 2019-06-07 (×8): 500 mg via ORAL
  Filled 2019-05-31 (×8): qty 3

## 2019-05-31 NOTE — ED Triage Notes (Signed)
Pt presents to ED via [facility] with c/o N/V and generalized weakness x 2 days. Per EMS pt with R sided deficits from previous stroke, CBG 354, BP 67/46 en route. Pt c/o lower abdominal pain as well.   Per EMS pt come from [address] is the address of the group home.   Upon arrival to ED BP 74/46.

## 2019-05-31 NOTE — Consult Note (Signed)
PHARMACY -  BRIEF ANTIBIOTIC NOTE   Pharmacy has received consult(s) for intra-abdominal infection from an ED provider.  The patient's profile has been reviewed for ht/wt/allergies/indication/available labs.    One time order(s) placed for cefepime and flagyl  Further antibiotics/pharmacy consults should be ordered by admitting physician if indicated.                       Thank you, Oswald Hillock 05/31/2019  3:51 PM

## 2019-05-31 NOTE — ED Provider Notes (Signed)
[facility] Emergency Department Provider Note  ____________________________________________   First MD Initiated Contact with Patient 05/31/19 1528     (approximate)  I have reviewed the triage vital signs and the nursing notes.   HISTORY  Chief Complaint Weakness, Nausea, and Emesis    HPI Sandra Massey is a 69 y.o. female  With h/o HTN, DM, prior substance abuse, here with multiple complaints. Pt's primary complaint is diffuse abd pain, nausea, vomiting, weakness. History somewhat limited due to mild confusion, ? Baseline cognitive impairment but she states she is here because she has severe, 8/10, aching, lower abd pain with associated nausea, NBNB emesis. She's had some mild diarrhea with this. She has had increasing urinary frequency and mild dysuria. No flank pain. She has noticed her BG running higher than usual as well. No known sick contacts. No known COVID exposures. She does not recall if she has been vaccinated yet.        Past Medical History:  Diagnosis Date  . Arthritis   . Diabetes mellitus without complication ([place])   . Hypertension   . Substance abuse [facility])     Patient Active Problem List   Diagnosis Date Noted  . DKA (diabetic ketoacidoses) ([place]) 05/31/2019    Past Surgical History:  Procedure Laterality Date  . BREAST BIOPSY Right    sebaceous cyst removed    Prior to Admission medications   Medication Sig Start Date End Date Taking? Authorizing Provider  aspirin 325 MG tablet Take 325 mg by mouth daily.   Yes [provider]  calcium carbonate (OS-CAL) 600 MG TABS tablet Take 600 mg by mouth daily.   Yes [provider]  cholecalciferol (VITAMIN D) 1000 UNITS tablet Take 1,000 Units by mouth daily.   Yes [provider]  ELIQUIS 5 MG TABS tablet Take 5 mg by mouth 2 (two) times daily. 03/11/19  Yes [provider]  glipiZIDE (GLUCOTROL) 5 MG tablet Take 5 mg by mouth daily before  breakfast.   Yes [provider]  lisinopril-hydrochlorothiazide (ZESTORETIC) 20-25 MG tablet Take 1 tablet by mouth daily. 05/03/19  Yes [provider]  metFORMIN (GLUCOPHAGE) 500 MG tablet Take 500 mg by mouth 2 (two) times daily. 05/03/19  Yes [provider]  rosuvastatin (CRESTOR) 40 MG tablet Take 40 mg by mouth daily. 05/06/19  Yes [provider]  sertraline (ZOLOFT) 50 MG tablet Take 50 mg by mouth daily. 05/03/19  Yes [provider]  simvastatin (ZOCOR) 40 MG tablet Take 40 mg by mouth every evening.   Yes [provider]  SOTALOL AF 80 MG TABS Take 1 tablet by mouth daily. 04/18/19  Yes [provider]  spironolactone (ALDACTONE) 25 MG tablet Take 25 mg by mouth daily. 04/20/19  Yes [provider]  TRULICITY 1.5 GG/2.6RS SOPN Inject 1.5 mg into the skin once a week. 05/19/19  Yes [provider]  carisoprodol (SOMA) 350 MG tablet Take 1 tablet (350 mg total) by mouth 3 (three) times daily. Patient not taking: Reported on 05/31/2019 12/21/14   Sherrie George B, FNP    Allergies Latex  No family history on file.  Social History Social History   Tobacco Use  . Smoking status: Never Smoker  . Smokeless tobacco: Never Used  . Tobacco comment: 2007 quit   Substance Use Topics  . Alcohol use: Never  . Drug use: Never    Review of Systems  Review of Systems  Constitutional: Positive for  chills and fatigue. Negative for fever.  HENT: Negative for congestion and sore throat.   Eyes: Negative for visual disturbance.  Respiratory: Negative for cough and shortness of breath.   Cardiovascular: Negative for chest pain.  Gastrointestinal: Positive for abdominal pain, nausea and vomiting. Negative for diarrhea.  Endocrine: Positive for polydipsia and polyuria.  Genitourinary: Negative for flank pain.  Musculoskeletal: Negative for back pain and neck pain.  Skin: Negative for rash and wound.  Neurological:  Positive for weakness.  All other systems reviewed and are negative.    ____________________________________________  PHYSICAL EXAM:      VITAL SIGNS: ED Triage Vitals  Enc Vitals Group     BP 05/31/19 1359 (!) 74/46     Pulse Rate 05/31/19 1359 88     Resp 05/31/19 1359 20     Temp 05/31/19 1359 (!) 97.4 F (36.3 C)     Temp Source 05/31/19 1359 Oral     SpO2 05/31/19 1359 98 %     Weight 05/31/19 1356 209 lb (94.8 kg)     Height 05/31/19 1356 5\' 6"  (1.676 m)     Head Circumference --      Peak Flow --      Pain Score --      Pain Loc --      Pain Edu? --      Excl. in [place]? --      Physical Exam Vitals and nursing note reviewed.  Constitutional:      General: She is not in acute distress.    Appearance: She is well-developed.  HENT:     Head: Normocephalic and atraumatic.     Mouth/Throat:     Mouth: Mucous membranes are dry.  Eyes:     Conjunctiva/sclera: Conjunctivae normal.  Cardiovascular:     Rate and Rhythm: Regular rhythm. Tachycardia present.     Heart sounds: Normal heart sounds. No murmur. No friction rub.  Pulmonary:     Effort: Pulmonary effort is normal. No respiratory distress.     Breath sounds: Normal breath sounds. No wheezing or rales.  Abdominal:     General: Abdomen is flat. There is no distension.     Palpations: Abdomen is soft.     Tenderness: There is no abdominal tenderness (generalized).  Musculoskeletal:     Cervical back: Neck supple.  Skin:    General: Skin is warm.     Capillary Refill: Capillary refill takes less than 2 seconds.  Neurological:     Mental Status: She is alert and oriented to person, place, and time.     Motor: No abnormal muscle tone.       ____________________________________________   LABS (all labs ordered are listed, but only abnormal results are displayed)  Labs Reviewed  LIPASE, BLOOD - Abnormal; Notable for the following components:      Result Value   Lipase 111 (*)    All other components  within normal limits  COMPREHENSIVE METABOLIC PANEL - Abnormal; Notable for the following components:   Sodium 125 (*)    Potassium 5.4 (*)    Chloride 87 (*)    CO2 17 (*)    Glucose, Bld 367 (*)    BUN 84 (*)    Creatinine, Ser 4.01 (*)    GFR calc non Af Amer 11 (*)    GFR calc Af Amer 13 (*)    Anion gap 21 (*)    All other components within normal limits  CBC - Abnormal;  Notable for the following components:   WBC 12.4 (*)    RBC 5.65 (*)    Hemoglobin 16.4 (*)    HCT 48.3 (*)    All other components within normal limits  URINALYSIS, COMPLETE (UACMP) WITH MICROSCOPIC - Abnormal; Notable for the following components:   Color, Urine AMBER (*)    APPearance CLOUDY (*)    Glucose, UA >=500 (*)    Hgb urine dipstick SMALL (*)    Ketones, ur 5 (*)    Protein, ur >=300 (*)    Leukocytes,Ua MODERATE (*)    WBC, UA >50 (*)    Bacteria, UA FEW (*)    Non Squamous Epithelial PRESENT (*)    All other components within normal limits  LACTIC ACID, PLASMA - Abnormal; Notable for the following components:   Lactic Acid, Venous 4.9 (*)    All other components within normal limits  BETA-HYDROXYBUTYRIC ACID - Abnormal; Notable for the following components:   Beta-Hydroxybutyric Acid 3.25 (*)    All other components within normal limits  BLOOD GAS, VENOUS - Abnormal; Notable for the following components:   pH, Ven 7.19 (*)    Bicarbonate 19.5 (*)    Acid-base deficit 8.9 (*)    All other components within normal limits  GLUCOSE, CAPILLARY - Abnormal; Notable for the following components:   Glucose-Capillary 246 (*)    All other components within normal limits  GLUCOSE, CAPILLARY - Abnormal; Notable for the following components:   Glucose-Capillary 254 (*)    All other components within normal limits  TROPONIN I (HIGH SENSITIVITY) - Abnormal; Notable for the following components:   Troponin I (High Sensitivity) 121 (*)    All other components within normal limits  RESPIRATORY PANEL  BY RT PCR (FLU A&B, COVID)  CULTURE, BLOOD (ROUTINE X 2)  CULTURE, BLOOD (ROUTINE X 2)  URINE CULTURE  LACTIC ACID, PLASMA  BETA-HYDROXYBUTYRIC ACID  HIV ANTIBODY (ROUTINE TESTING W REFLEX)  BASIC METABOLIC PANEL  BASIC METABOLIC PANEL  BASIC METABOLIC PANEL  BASIC METABOLIC PANEL  BETA-HYDROXYBUTYRIC ACID  BETA-HYDROXYBUTYRIC ACID  HEMOGLOBIN A1C  CBC WITH DIFFERENTIAL/PLATELET  BETA-HYDROXYBUTYRIC ACID  CBG MONITORING, ED  TROPONIN I (HIGH SENSITIVITY)    ____________________________________________  EKG: Atrial fibrillation, VR 76. QRS 82, QTc 501. No acute St elevations or depressions. No ischemia. ________________________________________  RADIOLOGY All imaging, including plain films, CT scans, and ultrasounds, independently reviewed by me, and interpretations confirmed via formal radiology reads.  ED MD interpretation:   CXR: No acute abnormality  Official radiology report(s): CT ABDOMEN PELVIS WO CONTRAST  Result Date: 05/31/2019 CLINICAL DATA:  Nausea and vomiting, hypotensive EXAM: CT ABDOMEN AND PELVIS WITHOUT CONTRAST TECHNIQUE: Multidetector CT imaging of the abdomen and pelvis was performed following the standard protocol without IV contrast. COMPARISON:  03/17/2019 FINDINGS: Lower chest: No acute abnormality. Hepatobiliary: No focal liver abnormality is seen. Status post cholecystectomy. No biliary dilatation. Pancreas: Unremarkable. No pancreatic ductal dilatation or surrounding inflammatory changes. Spleen: Normal in size without focal abnormality. Adrenals/Urinary Tract: Nonobstructing 12 mm calculus lower pole right kidney. Nonobstructing 3 mm calculus lower pole left kidney. No obstructive uropathy within either kidney. Bilateral areas of renal cortical scarring and lobularity or stable. Left renal cysts unchanged. The ureters are unremarkable. Punctate calcification again seen within the dependent bladder near the urethral orifice, reference image 77. 3 mm  calcifications in the lower pelvis near the urethra reference image 80 may reflect calculi within a urethral diverticulum. These are stable. Stomach/Bowel: Scattered diverticulosis within  the descending and sigmoid colon, without acute diverticulitis. Normal appendix right lower quadrant. No bowel obstruction or ileus. Vascular/Lymphatic: Stable atherosclerosis throughout the abdominal aorta. No pathologic adenopathy. Reproductive: Uterus and bilateral adnexa are unremarkable. Other: No abdominal wall hernia or abnormality. No abdominopelvic ascites. Musculoskeletal: No acute or significant osseous findings. IMPRESSION: 1. Stable bilateral nephrolithiasis, bladder calculus, and calculi within a urethral diverticulum. No obstructive uropathy. 2. Colonic diverticulosis without acute diverticulitis. 3. Aortic Atherosclerosis (ICD10-I70.0). Electronically Signed   By: Randa Ngo M.D.   On: 05/31/2019 17:05   DG Chest 2 View  Result Date: 05/31/2019 CLINICAL DATA:  Weakness and hypotension. EXAM: CHEST - 2 VIEW COMPARISON:  None. FINDINGS: The lungs are clear without focal pneumonia, edema, pneumothorax or pleural effusion. The cardiopericardial silhouette is within normal limits for size. Old left rib fractures noted. IMPRESSION: No active cardiopulmonary disease. Electronically Signed   By: Misty Stanley M.D.   On: 05/31/2019 15:16    ____________________________________________  PROCEDURES   Procedure(s) performed (including Critical Care):  .Critical Care Performed by: Duffy Bruce, MD Authorized by: Duffy Bruce, MD   Critical care provider statement:    Critical care time (minutes):  35   Critical care time was exclusive of:  Separately billable procedures and treating other patients and teaching time   Critical care was necessary to treat or prevent imminent or life-threatening deterioration of the following conditions:  Circulatory failure, cardiac failure, metabolic crisis and  respiratory failure   Critical care was time spent personally by me on the following activities:  Development of treatment plan with patient or surrogate, discussions with consultants, evaluation of patient's response to treatment, examination of patient, obtaining history from patient or surrogate, ordering and performing treatments and interventions, ordering and review of laboratory studies, ordering and review of radiographic studies, pulse oximetry, re-evaluation of patient's condition and review of old charts   I assumed direction of critical care for this patient from another provider in my specialty: no   .1-3 Lead EKG Interpretation Performed by: Duffy Bruce, MD Authorized by: Duffy Bruce, MD     Interpretation: normal     ECG rate:  80-100   ECG rate assessment: normal     Rhythm: sinus rhythm     Ectopy: none     Conduction: abnormal   Comments:     Indication: DKA on insulin drip, lactic acidosis, hyperkalemia Ultrasound ED Peripheral IV (Provider)  Date/Time: 05/31/2019 5:36 PM Performed by: Duffy Bruce, MD Authorized by: Duffy Bruce, MD   Procedure details:    Indications: multiple failed IV attempts     Skin Prep: chlorhexidine gluconate     Location:  Right AC   Angiocath:  20 G   Bedside Ultrasound Guided: Yes     Images: archived     Patient tolerated procedure without complications: Yes     Dressing applied: Yes   Ultrasound ED Peripheral IV (Provider)  Date/Time: 05/31/2019 5:36 PM Performed by: Duffy Bruce, MD Authorized by: Duffy Bruce, MD   Procedure details:    Indications: multiple failed IV attempts     Skin Prep: chlorhexidine gluconate     Location:  Right forearm   Angiocath:  20 G   Bedside Ultrasound Guided: Yes     Images: archived     Patient tolerated procedure without complications: Yes     Dressing applied: Yes      ____________________________________________  INITIAL IMPRESSION / MDM / [place] /  ED COURSE  As part of  my medical decision making, I reviewed the following data within the [place] notes reviewed and incorporated, Old chart reviewed, Notes from prior ED visits, and [place] Controlled Substance Database       *ETHELINE GEPPERT was evaluated in Emergency Department on 05/31/2019 for the symptoms described in the history of present illness. She was evaluated in the context of the global COVID-19 pandemic, which necessitated consideration that the patient might be at risk for infection with the SARS-CoV-2 virus that causes COVID-19. Institutional protocols and algorithms that pertain to the evaluation of patients at risk for COVID-19 are in a state of rapid change based on information released by regulatory bodies including the CDC and federal and state organizations. These policies and algorithms were followed during the patient's care in the ED.  Some ED evaluations and interventions may be delayed as a result of limited staffing during the pandemic.*     Medical Decision Making: 69 year old female with history of diabetes here with nausea, vomiting, generalized weakness.  Lab work reveals likely DKA with severe acute kidney injury, likely prerenal secondary to her nausea and vomiting.  CT scan shows no evidence of acute obstruction or surgical abnormality.  She is mentating well.  Chest x-ray is without pneumonia.  Urinalysis is pending.  Will plan to admit for management of DKA.  Lactated Ringer's given as well as empiric antibiotics given her leukocytosis, lactic acidosis, and hypotension, though this could all be more so secondary to metabolic derangements from her DKA.  Insulin drip also started.  OF NOTE, EKG shows possible new onset afib? Unclear if this truly new. CHAD2-VASC 3.  ____________________________________________  FINAL CLINICAL IMPRESSION(S) / ED DIAGNOSES  Final diagnoses:  Diabetic ketoacidosis without coma associated with type 2 diabetes  mellitus (HCC)  Lactic acidosis  Generalized abdominal pain  New onset atrial fibrillation ([facility])     MEDICATIONS GIVEN DURING THIS VISIT:  Medications  sodium chloride flush (NS) 0.9 % injection 3 mL (has no administration in time range)  insulin regular, human (MYXREDLIN) 100 units/ 100 mL infusion (7.5 Units/hr Intravenous Rate/Dose Change 05/31/19 1929)  0.9 %  sodium chloride infusion (has no administration in time range)  dextrose 5 %-0.45 % sodium chloride infusion ( Intravenous New Bag/Given 05/31/19 1750)  dextrose 50 % solution 0-50 mL (has no administration in time range)  cefTRIAXone (ROCEPHIN) 2 g in sodium chloride 0.9 % 100 mL IVPB (has no administration in time range)  0.9 %  sodium chloride infusion (has no administration in time range)  rosuvastatin (CRESTOR) tablet 40 mg (has no administration in time range)  SOTALOL AF TABS 80 mg (has no administration in time range)  apixaban (ELIQUIS) tablet 5 mg (has no administration in time range)  calcium carbonate (OS-CAL) tablet 600 mg (has no administration in time range)  cholecalciferol (VITAMIN D3) tablet 1,000 Units (has no administration in time range)  0.9 %  sodium chloride infusion (has no administration in time range)  dextrose 5 %-0.45 % sodium chloride infusion (has no administration in time range)  dextrose 50 % solution 0-50 mL (has no administration in time range)  acetaminophen (TYLENOL) tablet 650 mg (has no administration in time range)  ondansetron (ZOFRAN) injection 4 mg (has no administration in time range)  magnesium hydroxide (MILK OF MAGNESIA) suspension 30 mL (has no administration in time range)  traZODone (DESYREL) tablet 25 mg (has no administration in time range)  morphine 2 MG/ML injection 2 mg (has no administration in  time range)  lactated ringers bolus 1,000 mL (1,000 mLs Intravenous New Bag/Given 05/31/19 1424)  ceFEPIme (MAXIPIME) 2 g in sodium chloride 0.9 % 100 mL IVPB (0 g Intravenous  Stopped 05/31/19 1710)  metroNIDAZOLE (FLAGYL) IVPB 500 mg (0 mg Intravenous Stopped 05/31/19 1924)  lactated ringers bolus 1,000 mL (0 mLs Intravenous Stopped 05/31/19 2022)    And  lactated ringers bolus 1,000 mL (1,000 mLs Intravenous New Bag/Given 05/31/19 2018)  fentaNYL (SUBLIMAZE) injection 50 mcg (50 mcg Intravenous Given 05/31/19 1642)  ondansetron (ZOFRAN) injection 4 mg (4 mg Intravenous Given 05/31/19 1641)     ED Discharge Orders    None       Note:  This document was prepared using Dragon voice recognition software and may include unintentional dictation errors.   Duffy Bruce, MD 05/31/19 2118

## 2019-05-31 NOTE — ED Notes (Signed)
First Nurse Note:  [facility].  Per EMS, pt with N/V and increased weakness x 2 days.    Per EMS, BP 67/46.  No prior PIV or fluids given.  Other vitals WDL.    Pt to be triaged next with repeat vitals performed.  Pt otherwise alert, NAD noted at this time.

## 2019-05-31 NOTE — ED Notes (Signed)
Rufina Falco, NP, made aware of pt BP of 82/53

## 2019-05-31 NOTE — Progress Notes (Signed)
Communicated with bedside nurse Alyssa stated she thought prior RN sent 2nd LA if not she would take care of it

## 2019-05-31 NOTE — H&P (Addendum)
[facility] NAME: Sandra Massey    MR#:  [id number]  DATE OF BIRTH:  November 22, 1950  DATE OF ADMISSION:  05/31/2019  PRIMARY CARE PHYSICIAN: [name], MD   REQUESTING/REFERRING PHYSICIAN: Duffy Bruce, MD CHIEF COMPLAINT:   Chief Complaint  Patient presents with  . Weakness  . Nausea  . Emesis    HISTORY OF PRESENT ILLNESS:  Sandra Massey  is a 69 y.o. Caucasian female with a known history of cognitive dysfunction, who lives in a group home, with type 2 diabetes mellitus, and hypertension as well as osteoarthritis, who presented to the emergency room with acute onset of intractable nausea and vomiting for the last couple of days.  She was not clear about frequent bowel movements but denied any loose or watery ones.  She denied any fever or chills.  She admitted to periumbilical abdominal pain with her nausea and vomiting.  She has been having polyuria and polydipsia.  No dysuria or hematuria or flank pain.  No cough or wheezing or hemoptysis.  No chest pain or palpitations.  Upon presentation to the emergency room, blood pressure was 74/46 with a temperature of 97.4 otherwise normal vital signs.  Blood pressure came up with hydration to 101/65 and later 117/72.  Labs are remarkable for VBG with pH 7.19 and PCO2 51 bicarbonate of 19.5.  CMP revealed hyponatremia and hypochloremia and CO2 of 17 with blood glucose of 367 anion gap of 21.  BUN and creatinine were 84 and 4.01 compared to 18 and 0.9 on 03/17/2019.  Lactic acid was 4.9 and high-sensitivity troponin I was 121.  CBC showed no leukocytosis of 12.4 with hemoconcentration.  Beta hydroxybutyrate was 3.25.  Urinalysis showed more than 300 protein and more than 500 glucose well as few bacteria, 11-20 RBCs and more than 50 WBCs.EKG showed atrial fibrillation with controlled ventricular sponsor of 76 with low voltage QRS.  The patient was given 1 L bolus of IV lactated Ringer and was ordered another couple  liters, 4 mg of IV Zofran, IV cefepime and Flagyl as well as 50 mcg of IV fentanyl and IV insulin drip for her DKA.  She will be admitted to the stepdown unit for further evaluation and management. PAST MEDICAL HISTORY:   Past Medical History:  Diagnosis Date  . Arthritis   . Diabetes mellitus without complication ([place])   . Hypertension   . Substance abuse ([place])   Chronic atrial fibrillation on sotalol and Eliquis.  PAST SURGICAL HISTORY:   Past Surgical History:  Procedure Laterality Date  . BREAST BIOPSY Right    sebaceous cyst removed    SOCIAL HISTORY:   Social History   Tobacco Use  . Smoking status: Never Smoker  . Smokeless tobacco: Never Used  . Tobacco comment: 2007 quit   Substance Use Topics  . Alcohol use: Never    FAMILY HISTORY:  No family history on file. She did not recall any familial diseases. DRUG ALLERGIES:   Allergies  Allergen Reactions  . Latex Rash    REVIEW OF SYSTEMS:   ROS As per history of present illness. All pertinent systems were reviewed above. Constitutional,  HEENT, cardiovascular, respiratory, GI, GU, musculoskeletal, neuro, psychiatric, endocrine,  integumentary and hematologic systems were reviewed and are otherwise  negative/unremarkable except for positive findings mentioned above in the HPI.   MEDICATIONS AT HOME:   Prior to Admission medications   Medication Sig Start Date End Date Taking? Authorizing Provider  aspirin 325 MG tablet Take 325 mg by mouth daily.   Yes [provider]  calcium carbonate (OS-CAL) 600 MG TABS tablet Take 600 mg by mouth daily.   Yes [provider]  cholecalciferol (VITAMIN D) 1000 UNITS tablet Take 1,000 Units by mouth daily.   Yes [provider]  ELIQUIS 5 MG TABS tablet Take 5 mg by mouth 2 (two) times daily. 03/11/19  Yes [provider]  glipiZIDE (GLUCOTROL) 5 MG tablet Take 5 mg by mouth daily before breakfast.   Yes [provider]    lisinopril-hydrochlorothiazide (ZESTORETIC) 20-25 MG tablet Take 1 tablet by mouth daily. 05/03/19  Yes [provider]  metFORMIN (GLUCOPHAGE) 500 MG tablet Take 500 mg by mouth 2 (two) times daily. 05/03/19  Yes [provider]  rosuvastatin (CRESTOR) 40 MG tablet Take 40 mg by mouth daily. 05/06/19  Yes [provider]  sertraline (ZOLOFT) 50 MG tablet Take 50 mg by mouth daily. 05/03/19  Yes [provider]  simvastatin (ZOCOR) 40 MG tablet Take 40 mg by mouth every evening.   Yes [provider]  SOTALOL AF 80 MG TABS Take 1 tablet by mouth daily. 04/18/19  Yes [provider]  spironolactone (ALDACTONE) 25 MG tablet Take 25 mg by mouth daily. 04/20/19  Yes [provider]  TRULICITY 1.5 BD/5.3GD SOPN Inject 1.5 mg into the skin once a week. 05/19/19  Yes [provider]  carisoprodol (SOMA) 350 MG tablet Take 1 tablet (350 mg total) by mouth 3 (three) times daily. Patient not taking: Reported on 05/31/2019 12/21/14   Sherrie George B, FNP      VITAL SIGNS:  Blood pressure (!) 106/54, pulse 72, temperature 97.6 F (36.4 C), temperature source Oral, resp. rate 17, height 5\' 6"  (1.676 m), weight 94.8 kg, SpO2 99 %.  PHYSICAL EXAMINATION:  Physical Exam  GENERAL:  69 y.o.-year-old Caucasian female patient lying in the bed with no acute distress.  EYES: Pupils equal, round, reactive to light and accommodation. No scleral icterus. Extraocular muscles intact.  HEENT: Head atraumatic, normocephalic. Oropharynx with slightly dry mucous membrane and tongue and nasopharynx clear.  NECK:  Supple, no jugular venous distention. No thyroid enlargement, no tenderness.  LUNGS: Normal breath sounds bilaterally, no wheezing, rales,rhonchi or crepitation. No use of accessory muscles of respiration.  CARDIOVASCULAR: Regular rate and rhythm, S1, S2 normal. No murmurs, rubs, or gallops.  ABDOMEN: Soft, nondistended with mild paramedical  abdominal tenderness without rebound tenderness guarding or rigidity.  Bowel sounds present. No organomegaly or mass.  EXTREMITIES: No pedal edema, cyanosis, or clubbing.  NEUROLOGIC: Cranial nerves II through XII are intact. Muscle strength 5/5 in all extremities. Sensation intact. Gait not checked.  PSYCHIATRIC: The patient is alert and oriented x 3.  Normal affect and good eye contact. SKIN: No obvious rash, lesion, or ulcer.   LABORATORY PANEL:   CBC Recent Labs  Lab 05/31/19 1407  WBC 12.4*  HGB 16.4*  HCT 48.3*  PLT 368   ------------------------------------------------------------------------------------------------------------------  Chemistries  Recent Labs  Lab 05/31/19 1407  NA 125*  K 5.4*  CL 87*  CO2 17*  GLUCOSE 367*  BUN 84*  CREATININE 4.01*  CALCIUM 9.0  AST 18  ALT 29  ALKPHOS 98  BILITOT 1.1   ------------------------------------------------------------------------------------------------------------------  Cardiac Enzymes No results for input(s): TROPONINI in the last 168 hours. ------------------------------------------------------------------------------------------------------------------  RADIOLOGY:  CT ABDOMEN PELVIS WO CONTRAST  Result Date: 05/31/2019 CLINICAL DATA:  Nausea and vomiting, hypotensive EXAM:  CT ABDOMEN AND PELVIS WITHOUT CONTRAST TECHNIQUE: Multidetector CT imaging of the abdomen and pelvis was performed following the standard protocol without IV contrast. COMPARISON:  03/17/2019 FINDINGS: Lower chest: No acute abnormality. Hepatobiliary: No focal liver abnormality is seen. Status post cholecystectomy. No biliary dilatation. Pancreas: Unremarkable. No pancreatic ductal dilatation or surrounding inflammatory changes. Spleen: Normal in size without focal abnormality. Adrenals/Urinary Tract: Nonobstructing 12 mm calculus lower pole right kidney. Nonobstructing 3 mm calculus lower pole left kidney. No obstructive uropathy within  either kidney. Bilateral areas of renal cortical scarring and lobularity or stable. Left renal cysts unchanged. The ureters are unremarkable. Punctate calcification again seen within the dependent bladder near the urethral orifice, reference image 77. 3 mm calcifications in the lower pelvis near the urethra reference image 80 may reflect calculi within a urethral diverticulum. These are stable. Stomach/Bowel: Scattered diverticulosis within the descending and sigmoid colon, without acute diverticulitis. Normal appendix right lower quadrant. No bowel obstruction or ileus. Vascular/Lymphatic: Stable atherosclerosis throughout the abdominal aorta. No pathologic adenopathy. Reproductive: Uterus and bilateral adnexa are unremarkable. Other: No abdominal wall hernia or abnormality. No abdominopelvic ascites. Musculoskeletal: No acute or significant osseous findings. IMPRESSION: 1. Stable bilateral nephrolithiasis, bladder calculus, and calculi within a urethral diverticulum. No obstructive uropathy. 2. Colonic diverticulosis without acute diverticulitis. 3. Aortic Atherosclerosis (ICD10-I70.0). Electronically Signed   By: Randa Ngo M.D.   On: 05/31/2019 17:05   DG Chest 2 View  Result Date: 05/31/2019 CLINICAL DATA:  Weakness and hypotension. EXAM: CHEST - 2 VIEW COMPARISON:  None. FINDINGS: The lungs are clear without focal pneumonia, edema, pneumothorax or pleural effusion. The cardiopericardial silhouette is within normal limits for size. Old left rib fractures noted. IMPRESSION: No active cardiopulmonary disease. Electronically Signed   By: Misty Stanley M.D.   On: 05/31/2019 15:16      IMPRESSION AND PLAN:   1.  DKA with uncontrolled type 2 diabetes mellitus. -The patient will be admitted to a stepdown unit. -We will continue her on IV insulin drip per Endo tool. -We will follow serial BMPs. -We will aggressively hydrate with IV normal saline.  2.  Severe sepsis without septic shock.  This  likely secondary to UTI. -The patient will be aggressively hydrated with IV normal saline. -We will continue antibiotic therapy with IV Rocephin. -Obtain urine culture and follow blood cultures. -We will follow lactic acid level.  3.  UTI. -Management as above.  4.  Acute kidney injury. -This is likely prerenal secondary to volume depletion and dehydration due to intractable nausea and vomiting -The patient will be hydrated with IV normal saline and will follow BMPs. -We will hold off nephrotoxins. -Will place her on as needed antiemetics. -Obtain renal ultrasound. -Should her renal function does not improve with hydration, nephrology consult could be obtained.  5.  Dyslipidemia. -Will continue statin therapy.  6.  Chronic atrial fibrillation with controlled ventricular response. -We will continue sotalol and Eliquis.  7.  DVT prophylaxis. -We will continue Eliquis.   All the records are reviewed and case discussed with ED provider. The plan of care was discussed in details with the patient (and family). I answered all questions. The patient agreed to proceed with the above mentioned plan. Further management will depend upon hospital course.   CODE STATUS: Full code  The patient is admitted to inpatient status due to the intensity of medical problems and management requirement that will necessitate more than 2 midnights.  TOTAL TIME TAKING CARE OF THIS PATIENT: 55 minutes.  Christel Mormon M.D on 05/31/2019 at 6:37 PM  [facility]   From 7 PM-7 AM, contact night-coverage [email or web address]  CC: Primary care physician; [name], MD   Note: This dictation was prepared with Dragon dictation along with smaller phrase technology. Any transcriptional errors that result from this process are unintentional.

## 2019-05-31 NOTE — ED Notes (Signed)
This RN sent down pt 2nd lactic acid at 2129 along with other pt labs needed. Lab states that they received the other labs that were sent at the same time around 2129; however, they did not receive the lactic that was sent. This Rn will send 2nd lactic acid again.

## 2019-05-31 NOTE — ED Notes (Signed)
Per lab, urine specimen is not sufficient amount to run; will need recollect.

## 2019-05-31 NOTE — Consult Note (Signed)
CODE SEPSIS - PHARMACY COMMUNICATION  **Broad Spectrum Antibiotics should be administered within 1 hour of Sepsis diagnosis**  Time Code Sepsis Called/Page Received: 1544  Antibiotics Ordered: cefepime and flagyl  Time of 1st antibiotic administration: [place] ,PharmD Clinical Pharmacist  05/31/2019  3:50 PM

## 2019-06-01 ENCOUNTER — Inpatient Hospital Stay: Payer: Medicare HMO

## 2019-06-01 DIAGNOSIS — N200 Calculus of kidney: Secondary | ICD-10-CM

## 2019-06-01 DIAGNOSIS — A419 Sepsis, unspecified organism: Secondary | ICD-10-CM

## 2019-06-01 DIAGNOSIS — R6521 Severe sepsis with septic shock: Secondary | ICD-10-CM

## 2019-06-01 DIAGNOSIS — E872 Acidosis, unspecified: Secondary | ICD-10-CM | POA: Insufficient documentation

## 2019-06-01 DIAGNOSIS — N170 Acute kidney failure with tubular necrosis: Secondary | ICD-10-CM | POA: Insufficient documentation

## 2019-06-01 DIAGNOSIS — I482 Chronic atrial fibrillation, unspecified: Secondary | ICD-10-CM | POA: Insufficient documentation

## 2019-06-01 LAB — BASIC METABOLIC PANEL
Anion gap: 12 (ref 5–15)
Anion gap: 12 (ref 5–15)
Anion gap: 13 (ref 5–15)
Anion gap: 13 (ref 5–15)
BUN: 75 mg/dL — ABNORMAL HIGH (ref 8–23)
BUN: 77 mg/dL — ABNORMAL HIGH (ref 8–23)
BUN: 80 mg/dL — ABNORMAL HIGH (ref 8–23)
BUN: 81 mg/dL — ABNORMAL HIGH (ref 8–23)
CO2: 21 mmol/L — ABNORMAL LOW (ref 22–32)
CO2: 21 mmol/L — ABNORMAL LOW (ref 22–32)
CO2: 21 mmol/L — ABNORMAL LOW (ref 22–32)
CO2: 21 mmol/L — ABNORMAL LOW (ref 22–32)
Calcium: 7.9 mg/dL — ABNORMAL LOW (ref 8.9–10.3)
Calcium: 8 mg/dL — ABNORMAL LOW (ref 8.9–10.3)
Calcium: 8.1 mg/dL — ABNORMAL LOW (ref 8.9–10.3)
Calcium: 8.2 mg/dL — ABNORMAL LOW (ref 8.9–10.3)
Chloride: 97 mmol/L — ABNORMAL LOW (ref 98–111)
Chloride: 97 mmol/L — ABNORMAL LOW (ref 98–111)
Chloride: 98 mmol/L (ref 98–111)
Chloride: 99 mmol/L (ref 98–111)
Creatinine, Ser: 3.37 mg/dL — ABNORMAL HIGH (ref 0.44–1.00)
Creatinine, Ser: 3.52 mg/dL — ABNORMAL HIGH (ref 0.44–1.00)
Creatinine, Ser: 3.56 mg/dL — ABNORMAL HIGH (ref 0.44–1.00)
Creatinine, Ser: 3.59 mg/dL — ABNORMAL HIGH (ref 0.44–1.00)
GFR calc Af Amer: 14 mL/min — ABNORMAL LOW (ref >60)
GFR calc Af Amer: 15 mL/min — ABNORMAL LOW (ref >60)
GFR calc Af Amer: 15 mL/min — ABNORMAL LOW (ref >60)
GFR calc Af Amer: 16 mL/min — ABNORMAL LOW (ref >60)
GFR calc non Af Amer: 12 mL/min — ABNORMAL LOW (ref >60)
GFR calc non Af Amer: 13 mL/min — ABNORMAL LOW (ref >60)
GFR calc non Af Amer: 13 mL/min — ABNORMAL LOW (ref >60)
GFR calc non Af Amer: 13 mL/min — ABNORMAL LOW (ref >60)
Glucose, Bld: 116 mg/dL — ABNORMAL HIGH (ref 70–99)
Glucose, Bld: 123 mg/dL — ABNORMAL HIGH (ref 70–99)
Glucose, Bld: 180 mg/dL — ABNORMAL HIGH (ref 70–99)
Glucose, Bld: 288 mg/dL — ABNORMAL HIGH (ref 70–99)
Potassium: 3.9 mmol/L (ref 3.5–5.1)
Potassium: 3.9 mmol/L (ref 3.5–5.1)
Potassium: 4.1 mmol/L (ref 3.5–5.1)
Potassium: 4.1 mmol/L (ref 3.5–5.1)
Sodium: 130 mmol/L — ABNORMAL LOW (ref 135–145)
Sodium: 131 mmol/L — ABNORMAL LOW (ref 135–145)
Sodium: 131 mmol/L — ABNORMAL LOW (ref 135–145)
Sodium: 133 mmol/L — ABNORMAL LOW (ref 135–145)

## 2019-06-01 LAB — CBC WITH DIFFERENTIAL/PLATELET
Abs Immature Granulocytes: 0.08 10*3/uL — ABNORMAL HIGH (ref 0.00–0.07)
Basophils Absolute: 0.1 10*3/uL (ref 0.0–0.1)
Basophils Relative: 0 %
Eosinophils Absolute: 0.1 10*3/uL (ref 0.0–0.5)
Eosinophils Relative: 0 %
HCT: 42.1 % (ref 36.0–46.0)
Hemoglobin: 14.1 g/dL (ref 12.0–15.0)
Immature Granulocytes: 1 %
Lymphocytes Relative: 12 %
Lymphs Abs: 1.9 10*3/uL (ref 0.7–4.0)
MCH: 29.2 pg (ref 26.0–34.0)
MCHC: 33.5 g/dL (ref 30.0–36.0)
MCV: 87.2 fL (ref 80.0–100.0)
Monocytes Absolute: 1.2 10*3/uL — ABNORMAL HIGH (ref 0.1–1.0)
Monocytes Relative: 8 %
Neutro Abs: 12.8 10*3/uL — ABNORMAL HIGH (ref 1.7–7.7)
Neutrophils Relative %: 79 %
Platelets: 312 10*3/uL (ref 150–400)
RBC: 4.83 MIL/uL (ref 3.87–5.11)
RDW: 12.9 % (ref 11.5–15.5)
WBC: 16.1 10*3/uL — ABNORMAL HIGH (ref 4.0–10.5)
nRBC: 0 % (ref 0.0–0.2)

## 2019-06-01 LAB — PROCALCITONIN: Procalcitonin: 0.11 ng/mL

## 2019-06-01 LAB — GLUCOSE, CAPILLARY
Glucose-Capillary: 111 mg/dL — ABNORMAL HIGH (ref 70–99)
Glucose-Capillary: 120 mg/dL — ABNORMAL HIGH (ref 70–99)
Glucose-Capillary: 106 mg/dL — ABNORMAL HIGH (ref 70–99)
Glucose-Capillary: 124 mg/dL — ABNORMAL HIGH (ref 70–99)
Glucose-Capillary: 147 mg/dL — ABNORMAL HIGH (ref 70–99)
Glucose-Capillary: 135 mg/dL — ABNORMAL HIGH (ref 70–99)
Glucose-Capillary: 142 mg/dL — ABNORMAL HIGH (ref 70–99)
Glucose-Capillary: 162 mg/dL — ABNORMAL HIGH (ref 70–99)
Glucose-Capillary: 212 mg/dL — ABNORMAL HIGH (ref 70–99)
Glucose-Capillary: 285 mg/dL — ABNORMAL HIGH (ref 70–99)
Glucose-Capillary: 359 mg/dL — ABNORMAL HIGH (ref 70–99)

## 2019-06-01 LAB — HEMOGLOBIN A1C
Hgb A1c MFr Bld: 11.4 % — ABNORMAL HIGH (ref 4.8–5.6)
Mean Plasma Glucose: 280.48 mg/dL

## 2019-06-01 LAB — URINE CULTURE: Culture: NO GROWTH

## 2019-06-01 LAB — BETA-HYDROXYBUTYRIC ACID
Beta-Hydroxybutyric Acid: 1.08 mmol/L — ABNORMAL HIGH (ref 0.05–0.27)
Beta-Hydroxybutyric Acid: 0.79 mmol/L — ABNORMAL HIGH (ref 0.05–0.27)
Beta-Hydroxybutyric Acid: 0.3 mmol/L — ABNORMAL HIGH (ref 0.05–0.27)

## 2019-06-01 LAB — LACTIC ACID, PLASMA: Lactic Acid, Venous: 2.1 mmol/L (ref 0.5–1.9)

## 2019-06-01 LAB — HIV ANTIBODY (ROUTINE TESTING W REFLEX): HIV Screen 4th Generation wRfx: NONREACTIVE

## 2019-06-01 LAB — MRSA PCR SCREENING: MRSA by PCR: NEGATIVE

## 2019-06-01 MED ORDER — SODIUM CHLORIDE 0.9 % IV BOLUS
500.0000 mL | Freq: Once | INTRAVENOUS | Status: AC
  Administered 2019-06-01: 500 mL via INTRAVENOUS

## 2019-06-01 MED ORDER — HYDROCODONE-ACETAMINOPHEN 5-325 MG PO TABS: 1.0000 | ORAL_TABLET | ORAL | Status: DC | PRN

## 2019-06-01 MED ORDER — SODIUM CHLORIDE 0.9 % IV SOLN: 250.0000 mL | INTRAVENOUS | Status: DC

## 2019-06-01 MED ORDER — PROMETHAZINE HCL 25 MG/ML IJ SOLN: 12.5000 mg | Freq: Four times a day (QID) | INTRAMUSCULAR | Status: DC | PRN

## 2019-06-01 MED ORDER — AMIODARONE HCL 200 MG PO TABS
200.0000 mg | ORAL_TABLET | Freq: Every day | ORAL | Status: DC
  Administered 2019-06-01 – 2019-06-06 (×6): 200 mg via ORAL
  Filled 2019-06-01 (×7): qty 1

## 2019-06-01 MED ORDER — NOREPINEPHRINE 4 MG/250ML-% IV SOLN: 0.0000 ug/min | INTRAVENOUS | Status: DC

## 2019-06-01 MED ORDER — INSULIN GLARGINE 100 UNIT/ML ~~LOC~~ SOLN
15.0000 [IU] | Freq: Every day | SUBCUTANEOUS | Status: DC
  Administered 2019-06-01: 15 [IU] via SUBCUTANEOUS
  Filled 2019-06-01 (×2): qty 0.15

## 2019-06-01 MED ORDER — SODIUM CHLORIDE 0.9 % IV SOLN: INTRAVENOUS | Status: DC

## 2019-06-01 MED ORDER — INSULIN ASPART 100 UNIT/ML ~~LOC~~ SOLN
0.0000 [IU] | Freq: Three times a day (TID) | SUBCUTANEOUS | Status: DC
  Administered 2019-06-01: 1 [IU] via SUBCUTANEOUS
  Administered 2019-06-01: 3 [IU] via SUBCUTANEOUS
  Administered 2019-06-01: 5 [IU] via SUBCUTANEOUS
  Filled 2019-06-01 (×3): qty 1

## 2019-06-01 MED ORDER — INSULIN ASPART 100 UNIT/ML ~~LOC~~ SOLN
10.0000 [IU] | Freq: Once | SUBCUTANEOUS | Status: AC
  Administered 2019-06-01: 10 [IU] via SUBCUTANEOUS
  Filled 2019-06-01: qty 1

## 2019-06-01 MED ORDER — CHLORHEXIDINE GLUCONATE CLOTH 2 % EX PADS
6.0000 | MEDICATED_PAD | Freq: Every day | CUTANEOUS | Status: DC
  Administered 2019-06-01 – 2019-06-07 (×6): 6 via TOPICAL

## 2019-06-01 MED ORDER — INSULIN ASPART 100 UNIT/ML ~~LOC~~ SOLN
0.0000 [IU] | Freq: Every day | SUBCUTANEOUS | Status: DC
  Administered 2019-06-01: 5 [IU] via SUBCUTANEOUS
  Administered 2019-06-02 – 2019-06-04 (×3): 2 [IU] via SUBCUTANEOUS
  Administered 2019-06-05: 4 [IU] via SUBCUTANEOUS
  Administered 2019-06-06: 3 [IU] via SUBCUTANEOUS
  Filled 2019-06-01 (×6): qty 1

## 2019-06-01 MED ORDER — DOPAMINE-DEXTROSE 3.2-5 MG/ML-% IV SOLN
0.0000 ug/kg/min | INTRAVENOUS | Status: DC
  Administered 2019-06-01: 5 ug/kg/min via INTRAVENOUS
  Filled 2019-06-01: qty 250

## 2019-06-01 MED ORDER — NOREPINEPHRINE 4 MG/250ML-% IV SOLN
2.0000 ug/min | INTRAVENOUS | Status: DC
  Administered 2019-06-01: 4 ug/min via INTRAVENOUS
  Administered 2019-06-02: 6 ug/min via INTRAVENOUS
  Filled 2019-06-01 (×2): qty 250

## 2019-06-01 MED ORDER — APIXABAN 2.5 MG PO TABS
2.5000 mg | ORAL_TABLET | Freq: Two times a day (BID) | ORAL | Status: DC
  Administered 2019-06-01 – 2019-06-02 (×3): 2.5 mg via ORAL
  Filled 2019-06-01 (×4): qty 1

## 2019-06-01 NOTE — ED Notes (Signed)
Ultrasound at bedside

## 2019-06-01 NOTE — Consult Note (Signed)
Name: Sandra Massey MRN: [id number] DOB: 21-Mar-1950    ADMISSION DATE:  05/31/2019 CONSULTATION DATE:  06/01/2019  REFERRING MD :  Dr. Leslye Peer  CHIEF COMPLAINT:  Nausea, vomiting, abdominal pain, generalized weakness  BRIEF PATIENT DESCRIPTION:  69 year old female admitted 3/22 with DKA, AKI, and severe sepsis with septic shock secondary to UTI. Now requiring vasopressors.  SIGNIFICANT EVENTS  3/22: presented to ED, to be admitted to stepdown unit 3/23: remains and ED due to bed availability issues, hypotensive requiring vasopressors, PCCM consulted  STUDIES:  3/22: Chest x-ray>>The lungs are clear without focal pneumonia, edema, pneumothorax or pleural effusion. The cardiopericardial silhouette is within normal limits for size. Old left rib fractures noted. 3/22: CT abdomen & Pelvis>>1. Stable bilateral nephrolithiasis, bladder calculus, and calculi within a urethral diverticulum. No obstructive uropathy. 2. Colonic diverticulosis without acute diverticulitis. 3. Aortic Atherosclerosis 3/23: Renal US>>1. Mild bilateral increased renal echogenicity. No acute renal abnormality. No hydronephrosis. 2. Tiny simple cysts both kidneys. Nonobstructing 12 mm calculus right kidney and 3 mm calculus left kidney. 3. Ureteral calcifications possibly representing stones again noted as noted on prior CT. Small amount of debris may be present in the Bladder. 3/24: 2D Echocardiogram>>  CULTURES: SARS-CoV-2 PCR 3/22>> negative Influenza PCR 3/22>> negative Blood culture x2 3/22>> Urine 3/22>> MRSA PCR 3/23>>  ANTIBIOTICS: Cefepime x1 dose 3/22 Flagyl x1 dose 3/22 Ceftriaxone 3/23>>  HISTORY OF PRESENT ILLNESS:   Sandra Massey is a 69 year old female with a past medical history significant for cognitive dysfunction who lives in a group home, type 2 diabetes mellitus, hypertension, osteoarthritis who presents to [facility] ED on 3/22 due to acute onset of intractable nausea and vomiting. She also  reported associated periumbilical abdominal pain, along with polyuria and polydipsia. She denied diarrhea, fever, chills, dysuria, hematuria, flank pain, shortness of breath, hemoptysis, chest pain, palpitation.  Upon presentation to the ED she was noted to be hypotensive with blood pressure 74/46 and temperature of 97.4, otherwise normal vital signs. Blood pressure improved with IV fluids to 101/65. Initial work-up revealed hyponatremia, CO2 17, glucose 367, anion gap 21, BUN 84, creatinine 4.01, lactic acid 1.9, high-sensitivity troponin 121, WBC 12.4. Beta hydroxybutyrate acid 3.25. Urinalysis is consistent with UTI. EKG showed atrial fibrillation with controlled ventricular rate. Chest x-ray is negative for any acute disease, CT abdomen and pelvis with bilateral nonobstructive nephrolithiasis obstructive ureteral stone.  She received 3 L of IV fluids, IV cefepime and Flagyl, and was placed on insulin drip. She was to be admitted to the stepdown unit by the Hospitalist. Due to bed availability issues, she has remained in the emergency department waiting bed placement.  On 06/01/2019 she again became hypotensive which was poorly responsive to additional IV fluids, ultimately requiring Levophed infusion. PCCM has been consulted for further work-up and treatment of DKA, severe sepsis with septic shock secondary to UTI requiring vasopressors, and AKI.  PAST MEDICAL HISTORY :   has a past medical history of Arthritis, Diabetes mellitus without complication ([place]), Hypertension, and Substance abuse ([place]).  has a past surgical history that includes Breast biopsy (Right). Prior to Admission medications   Medication Sig Start Date End Date Taking? Authorizing Provider  aspirin 325 MG tablet Take 325 mg by mouth daily.   Yes [provider]  calcium carbonate (OS-CAL) 600 MG TABS tablet Take 600 mg by mouth daily.   Yes [provider]  cholecalciferol (VITAMIN D) 1000 UNITS tablet Take 1,000  Units by mouth daily.   Yes [provider]  ELIQUIS 5 MG TABS tablet Take 5 mg by mouth 2 (two) times daily. 03/11/19  Yes [provider]  glipiZIDE (GLUCOTROL) 5 MG tablet Take 5 mg by mouth daily before breakfast.   Yes [provider]  lisinopril-hydrochlorothiazide (ZESTORETIC) 20-25 MG tablet Take 1 tablet by mouth daily. 05/03/19  Yes [provider]  metFORMIN (GLUCOPHAGE) 500 MG tablet Take 500 mg by mouth 2 (two) times daily. 05/03/19  Yes [provider]  rosuvastatin (CRESTOR) 40 MG tablet Take 40 mg by mouth daily. 05/06/19  Yes [provider]  sertraline (ZOLOFT) 50 MG tablet Take 50 mg by mouth daily. 05/03/19  Yes [provider]  simvastatin (ZOCOR) 40 MG tablet Take 40 mg by mouth every evening.   Yes [provider]  SOTALOL AF 80 MG TABS Take 1 tablet by mouth daily. 04/18/19  Yes [provider]  spironolactone (ALDACTONE) 25 MG tablet Take 25 mg by mouth daily. 04/20/19  Yes [provider]  TRULICITY 1.5 PI/9.5JO SOPN Inject 1.5 mg into the skin once a week. 05/19/19  Yes [provider]  carisoprodol (SOMA) 350 MG tablet Take 1 tablet (350 mg total) by mouth 3 (three) times daily. Patient not taking: Reported on 05/31/2019 12/21/14   Victorino Dike, FNP   Allergies  Allergen Reactions  . Latex Rash    FAMILY HISTORY:  family history is not on file. SOCIAL HISTORY:  reports that she has never smoked. She has never used smokeless tobacco. She reports that she does not drink alcohol or use drugs.   COVID-19 DISASTER DECLARATION:  FULL CONTACT PHYSICAL EXAMINATION WAS NOT POSSIBLE DUE TO TREATMENT OF COVID-19 AND  CONSERVATION OF PERSONAL PROTECTIVE EQUIPMENT, LIMITED EXAM FINDINGS INCLUDE-  Patient assessed or the symptoms described in the history of present illness.  In the context of the Global COVID-19 pandemic, which necessitated consideration that the patient might  be at risk for infection with the SARS-CoV-2 virus that causes COVID-19, Institutional protocols and algorithms that pertain to the evaluation of patients at risk for COVID-19 are in a state of rapid change based on information released by regulatory bodies including the CDC and federal and state organizations. These policies and algorithms were followed during the patient's care while in hospital.  REVIEW OF SYSTEMS:  Positives in BOLD Constitutional: Negative for fever, chills, weight loss, +malaise/fatigue and diaphoresis.  HENT: Negative for hearing loss, ear pain, nosebleeds, congestion, sore throat, neck pain, tinnitus and ear discharge.   Eyes: Negative for blurred vision, double vision, photophobia, pain, discharge and redness.  Respiratory: Negative for cough, hemoptysis, sputum production, shortness of breath, wheezing and stridor.   Cardiovascular: Negative for chest pain, palpitations, orthopnea, claudication, leg swelling and PND.  Gastrointestinal: Negative for heartburn, nausea, vomiting, +abdominal pain, diarrhea, constipation, blood in stool and melena.  Genitourinary: Negative for dysuria, urgency, frequency, hematuria and flank pain.  Musculoskeletal: Negative for myalgias, back pain, joint pain and falls.  Skin: Negative for itching and rash.  Neurological: Negative for dizziness, tingling, tremors, sensory change, speech change, focal weakness, seizures, loss of consciousness, weakness and headaches.  Endo/Heme/Allergies: Negative for environmental allergies and polydipsia. Does not bruise/bleed easily.  SUBJECTIVE:  Pt currently reports abdominal pain upon palpation Denies chest pain, SOB, cough, N/V/D, dysuria, dizziness On room air Requiring 6 mcg Levophed currently **Reports she is a Jehovah witness  VITAL SIGNS: Pulse Rate:  [49-110] 69 (03/23 2100) Resp:  [12-35] 20 (03/23 1930) BP: (77-116)/(42-85) 106/73 (03/23 2100)  SpO2:  [95 %-100 %] 100 % (03/23  2100)  PHYSICAL EXAMINATION: General: Acutely ill-appearing female, sitting in bed, on room air, in no acute distress Neuro: Awake, alert and oriented to person, place, situation (delayed responses, appears it takes time for her to process questions), follows commands, no focal deficits, speech clear HEENT: Atraumatic, normocephalic, neck supple, no JVD, pupils PERRLA Cardiovascular: Irregularly irregular rhythm, rate controlled, no murmurs rubs or gallops, 2+ pulses Lungs: Clear diminished to auscultation bilaterally, no wheezing or rails, even, nonlabored, normal effort Abdomen: Obese, soft, tender to palpation, nondistended, no guarding or rebound tenderness, bowel sounds positive x4 Musculoskeletal: Generalized weakness, no deformities, 2+ edema bilateral lower extremities Skin: Warm and dry, no obvious rashes, lesions, or ulcerations  Recent Labs  Lab 06/01/19 0319 06/01/19 0747 06/01/19 1213  NA 133* 131* 130*  K 4.1 3.9 4.1  CL 99 98 97*  CO2 21* 21* 21*  BUN 81* 77* 75*  CREATININE 3.59* 3.52* 3.37*  GLUCOSE 116* 180* 288*   Recent Labs  Lab 05/31/19 1407 06/01/19 0446  HGB 16.4* 14.1  HCT 48.3* 42.1  WBC 12.4* 16.1*  PLT 368 312   CT ABDOMEN PELVIS WO CONTRAST  Result Date: 05/31/2019 CLINICAL DATA:  Nausea and vomiting, hypotensive EXAM: CT ABDOMEN AND PELVIS WITHOUT CONTRAST TECHNIQUE: Multidetector CT imaging of the abdomen and pelvis was performed following the standard protocol without IV contrast. COMPARISON:  03/17/2019 FINDINGS: Lower chest: No acute abnormality. Hepatobiliary: No focal liver abnormality is seen. Status post cholecystectomy. No biliary dilatation. Pancreas: Unremarkable. No pancreatic ductal dilatation or surrounding inflammatory changes. Spleen: Normal in size without focal abnormality. Adrenals/Urinary Tract: Nonobstructing 12 mm calculus lower pole right kidney. Nonobstructing 3 mm calculus lower pole left kidney. No obstructive uropathy within  either kidney. Bilateral areas of renal cortical scarring and lobularity or stable. Left renal cysts unchanged. The ureters are unremarkable. Punctate calcification again seen within the dependent bladder near the urethral orifice, reference image 77. 3 mm calcifications in the lower pelvis near the urethra reference image 80 may reflect calculi within a urethral diverticulum. These are stable. Stomach/Bowel: Scattered diverticulosis within the descending and sigmoid colon, without acute diverticulitis. Normal appendix right lower quadrant. No bowel obstruction or ileus. Vascular/Lymphatic: Stable atherosclerosis throughout the abdominal aorta. No pathologic adenopathy. Reproductive: Uterus and bilateral adnexa are unremarkable. Other: No abdominal wall hernia or abnormality. No abdominopelvic ascites. Musculoskeletal: No acute or significant osseous findings. IMPRESSION: 1. Stable bilateral nephrolithiasis, bladder calculus, and calculi within a urethral diverticulum. No obstructive uropathy. 2. Colonic diverticulosis without acute diverticulitis. 3. Aortic Atherosclerosis (ICD10-I70.0). Electronically Signed   By: Randa Ngo M.D.   On: 05/31/2019 17:05   DG Chest 2 View  Result Date: 05/31/2019 CLINICAL DATA:  Weakness and hypotension. EXAM: CHEST - 2 VIEW COMPARISON:  None. FINDINGS: The lungs are clear without focal pneumonia, edema, pneumothorax or pleural effusion. The cardiopericardial silhouette is within normal limits for size. Old left rib fractures noted. IMPRESSION: No active cardiopulmonary disease. Electronically Signed   By: Misty Stanley M.D.   On: 05/31/2019 15:16   US RENAL  Result Date: 06/01/2019 CLINICAL DATA:  Acute kidney injury. EXAM: RENAL / URINARY TRACT ULTRASOUND COMPLETE COMPARISON:  CT 05/31/2019. FINDINGS: Right Kidney: Renal measurements: 10.9 x 5.3 x 5.2 cm = volume: 158.0 mL. Mild increased echogenicity. Two tiny simple cysts measuring up to 1.3 cm. No hydronephrosis  visualized. 12 mm nonobstructing calculus. Left Kidney: Renal measurements: 10.5 x 6.0 x 5.7 cm = volume: 185.6  mL. Mild increased echogenicity. Two tiny simple cysts, the largest measures 0.6 cm. 3 mm nonobstructing calculus. No hydronephrosis visualized. Bladder: Bladder is nondistended. Bilateral ureteral jets noted. Urethral calcifications possibly representing stones again noted as noted on prior CT. Small amount of debris may be present in the bladder. Other: This was a limited exam as the patient was unable to moved. IMPRESSION: 1. Mild bilateral increased renal echogenicity. No acute renal abnormality. No hydronephrosis. 2. Tiny simple cysts both kidneys. Nonobstructing 12 mm calculus right kidney and 3 mm calculus left kidney. 3. Ureteral calcifications possibly representing stones again noted as noted on prior CT. Small amount of debris may be present in the bladder. Electronically Signed   By: Marcello Moores  Register   On: 06/01/2019 06:54    ASSESSMENT / PLAN:  Hypotension in the setting of septic shock and hypovolemic shock Chronic atrial fibrillation (rate controlled) -Continuous cardiac monitoring -Maintain MAP greater than 65 -Received IV fluid resuscitation in the ED -IV fluids -Levophed as needed to maintain MAP goal -Trend lactic acid until normalized -Continue  p.o. amiodarone and Eliquis -Obtain 2D echocardiogram   Severe sepsis secondary to UTI -Monitor fever curve -Trend WBCs and procalcitonin -Follow cultures as above -Continue Rocephin   DKA>> resolved -Received IV fluids and insulin drip>> now converted to sliding scale insulin and long-acting insulin -CBGs -Sliding scale insulin -Follow ICU hypo/hyperglycemia protocol -Diabetes coordinator following, appreciate input -Hemoglobin A1c noted to be 11.4  AKI Bilateral nephrolithiasis without obstruction Hyponatremia -Monitor I&O's / urinary output -Follow BMP -Ensure adequate renal perfusion -Avoid nephrotoxic  agents as able -Replace electrolytes as indicated -IV fluids -Consider Nephrology consult -Urology reviewed CT abdomen/pelvis and renal ultrasound; no obstructing stones      Pt's prognosis is guarded, and she is at high risk for cardiac arrest and death  She reports she is a Jehovah Witness and REFUSES blood products in the event she were to need them    Disposition: ICU Goals of care: Full code VTE prophylaxis/anticoagulation: Eliquis Updates: Updated patient at bedside 06/01/2019 Consults: Hospitalist, Urology  Darel Hong, [facility] Pager: [phone]  06/01/2019, 9:30 PM

## 2019-06-01 NOTE — ED Notes (Signed)
Pt CBG at 0010 did not transfer to chart, this RN got CBG of 120

## 2019-06-01 NOTE — ED Notes (Signed)
Pt sitting up on bed. Provided supper tray.

## 2019-06-01 NOTE — ED Notes (Signed)
Pt ate all of breakfast.  

## 2019-06-01 NOTE — ED Notes (Signed)
Rufina Falco, NP, states that as long as pt MAP is >65 then BP is fine.

## 2019-06-01 NOTE — Progress Notes (Signed)
Patient ID: Sandra Massey, female   DOB: Aug 12, 1950, 69 y.o.   MRN: [id number] [facility] PROGRESS NOTE  Sandra Massey:[id number] DOB: May 08, 1950 DOA: 05/31/2019 PCP: [name], MD  HPI/Subjective: Patient answers a few questions.  States she does not feel well.  Has a little lower abdominal discomfort.  Had nausea vomiting prior to coming in.  States she ate some breakfast today.  Objective: Vitals:   06/01/19 1225 06/01/19 1300  BP: (!) 84/70 (!) 115/49  Pulse:  87  Resp: 18 (!) 35  Temp:    SpO2:  95%    Intake/Output Summary (Last 24 hours) at 06/01/2019 1316 Last data filed at 06/01/2019 1258 Gross per 24 hour  Intake 10477.26 ml  Output --  Net 10477.26 ml   Filed Weights   05/31/19 1356  Weight: 94.8 kg    ROS: Review of Systems  Constitutional: Negative for fever.  Eyes: Negative for blurred vision.  Respiratory: Negative for shortness of breath.   Cardiovascular: Negative for chest pain.  Gastrointestinal: Positive for abdominal pain and nausea. Negative for vomiting.  Genitourinary: Negative for dysuria.  Musculoskeletal: Negative for joint pain.  Neurological: Negative for dizziness.   Exam: Physical Exam  HENT:  Nose: No mucosal edema.  Mouth/Throat: No oropharyngeal exudate or posterior oropharyngeal edema.  Eyes: Conjunctivae and lids are normal.  Cardiovascular: S1 normal and S2 normal. Exam reveals no gallop.  No murmur heard. Respiratory: No respiratory distress. She has no wheezes. She has no rhonchi. She has no rales.  GI: Soft. Bowel sounds are normal. There is abdominal tenderness in the suprapubic area.  Musculoskeletal:     Right ankle: No swelling.     Left ankle: No swelling.  Lymphadenopathy:    She has no cervical adenopathy.  Neurological: She is alert. No cranial nerve deficit.  Skin: Skin is warm. No rash noted. Nails show no clubbing.  Psychiatric:  Slow with some of her answers but answers appropriately       Data Reviewed: Basic Metabolic Panel: Recent Labs  Lab 05/31/19 2129 05/31/19 2350 06/01/19 0319 06/01/19 0747 06/01/19 1213  NA 131* 131* 133* 131* 130*  K 4.1 3.9 4.1 3.9 4.1  CL 96* 97* 99 98 97*  CO2 22 21* 21* 21* 21*  GLUCOSE 177* 123* 116* 180* 288*  BUN 81* 80* 81* 77* 75*  CREATININE 3.73* 3.56* 3.59* 3.52* 3.37*  CALCIUM 8.3* 8.0* 8.2* 8.1* 7.9*   Liver Function Tests: Recent Labs  Lab 05/31/19 1407  AST 18  ALT 29  ALKPHOS 98  BILITOT 1.1  PROT 8.1  ALBUMIN 4.2   Recent Labs  Lab 05/31/19 1407  LIPASE 111*   CBC: Recent Labs  Lab 05/31/19 1407 06/01/19 0446  WBC 12.4* 16.1*  NEUTROABS  --  12.8*  HGB 16.4* 14.1  HCT 48.3* 42.1  MCV 85.5 87.2  PLT 368 312    CBG: Recent Labs  Lab 06/01/19 0607 06/01/19 0730 06/01/19 0842 06/01/19 1015 06/01/19 1211  GLUCAP 147* 135* 142* 162* 212*    Recent Results (from the past 240 hour(s))  Blood culture (routine x 2)     Status: None (Preliminary result)   Collection Time: 05/31/19  3:33 PM   Specimen: BLOOD  Result Value Ref Range Status   Specimen Description BLOOD BLOOD RIGHT FOREARM  Final   Special Requests   Final    BOTTLES DRAWN AEROBIC AND ANAEROBIC Blood Culture adequate volume   Culture   Final  NO GROWTH < 24 HOURS Performed at [facility], [address] [place], [place] [address]    Report Status PENDING  Incomplete  Blood culture (routine x 2)     Status: None (Preliminary result)   Collection Time: 05/31/19  3:38 PM   Specimen: BLOOD  Result Value Ref Range Status   Specimen Description BLOOD BLOOD RIGHT ARM  Final   Special Requests   Final    BOTTLES DRAWN AEROBIC AND ANAEROBIC Blood Culture adequate volume   Culture   Final    NO GROWTH < 24 HOURS Performed at [facility], [address], [place], [place] [address]    Report Status PENDING  Incomplete  Respiratory Panel by RT PCR (Flu A&B, Covid) - Nasopharyngeal Swab     Status: None    Collection Time: 05/31/19  3:55 PM   Specimen: Nasopharyngeal Swab  Result Value Ref Range Status   SARS Coronavirus 2 by RT PCR NEGATIVE NEGATIVE Final    Comment: (NOTE) SARS-CoV-2 target nucleic acids are NOT DETECTED. The SARS-CoV-2 RNA is generally detectable in upper respiratoy specimens during the acute phase of infection. The lowest concentration of SARS-CoV-2 viral copies this assay can detect is 131 copies/mL. A negative result does not preclude SARS-Cov-2 infection and should not be used as the sole basis for treatment or other patient management decisions. A negative result may occur with  improper specimen collection/handling, submission of specimen other than nasopharyngeal swab, presence of viral mutation(s) within the areas targeted by this assay, and inadequate number of viral copies (<131 copies/mL). A negative result must be combined with clinical observations, patient history, and epidemiological information. The expected result is Negative. Fact Sheet for Patients:  PinkCheek.be Fact Sheet for Healthcare Providers:  GravelBags.it This test is not yet ap proved or cleared by the [place] FDA and  has been authorized for detection and/or diagnosis of SARS-CoV-2 by FDA under an Emergency Use Authorization (EUA). This EUA will remain  in effect (meaning this test can be used) for the duration of the COVID-19 declaration under Section 564(b)(1) of the Act, 21 U.S.C. section 360bbb-3(b)(1), unless the authorization is terminated or revoked sooner.    Influenza A by PCR NEGATIVE NEGATIVE Final   Influenza B by PCR NEGATIVE NEGATIVE Final    Comment: (NOTE) The Xpert Xpress SARS-CoV-2/FLU/RSV assay is intended as an aid in  the diagnosis of influenza from Nasopharyngeal swab specimens and  should not be used as a sole basis for treatment. Nasal washings and  aspirates are unacceptable for Xpert Xpress  SARS-CoV-2/FLU/RSV  testing. Fact Sheet for Patients: PinkCheek.be Fact Sheet for Healthcare Providers: GravelBags.it This test is not yet approved or cleared by the [place] FDA and  has been authorized for detection and/or diagnosis of SARS-CoV-2 by  FDA under an Emergency Use Authorization (EUA). This EUA will remain  in effect (meaning this test can be used) for the duration of the  Covid-19 declaration under Section 564(b)(1) of the Act, 21  U.S.C. section 360bbb-3(b)(1), unless the authorization is  terminated or revoked. Performed at [facility], [address] [place], [place] [address]      Studies: CT ABDOMEN PELVIS WO CONTRAST  Result Date: 05/31/2019 CLINICAL DATA:  Nausea and vomiting, hypotensive EXAM: CT ABDOMEN AND PELVIS WITHOUT CONTRAST TECHNIQUE: Multidetector CT imaging of the abdomen and pelvis was performed following the standard protocol without IV contrast. COMPARISON:  03/17/2019 FINDINGS: Lower chest: No acute abnormality. Hepatobiliary: No focal liver  abnormality is seen. Status post cholecystectomy. No biliary dilatation. Pancreas: Unremarkable. No pancreatic ductal dilatation or surrounding inflammatory changes. Spleen: Normal in size without focal abnormality. Adrenals/Urinary Tract: Nonobstructing 12 mm calculus lower pole right kidney. Nonobstructing 3 mm calculus lower pole left kidney. No obstructive uropathy within either kidney. Bilateral areas of renal cortical scarring and lobularity or stable. Left renal cysts unchanged. The ureters are unremarkable. Punctate calcification again seen within the dependent bladder near the urethral orifice, reference image 77. 3 mm calcifications in the lower pelvis near the urethra reference image 80 may reflect calculi within a urethral diverticulum. These are stable. Stomach/Bowel: Scattered diverticulosis within the descending and sigmoid colon,  without acute diverticulitis. Normal appendix right lower quadrant. No bowel obstruction or ileus. Vascular/Lymphatic: Stable atherosclerosis throughout the abdominal aorta. No pathologic adenopathy. Reproductive: Uterus and bilateral adnexa are unremarkable. Other: No abdominal wall hernia or abnormality. No abdominopelvic ascites. Musculoskeletal: No acute or significant osseous findings. IMPRESSION: 1. Stable bilateral nephrolithiasis, bladder calculus, and calculi within a urethral diverticulum. No obstructive uropathy. 2. Colonic diverticulosis without acute diverticulitis. 3. Aortic Atherosclerosis (ICD10-I70.0). Electronically Signed   By: Randa Ngo M.D.   On: 05/31/2019 17:05   DG Chest 2 View  Result Date: 05/31/2019 CLINICAL DATA:  Weakness and hypotension. EXAM: CHEST - 2 VIEW COMPARISON:  None. FINDINGS: The lungs are clear without focal pneumonia, edema, pneumothorax or pleural effusion. The cardiopericardial silhouette is within normal limits for size. Old left rib fractures noted. IMPRESSION: No active cardiopulmonary disease. Electronically Signed   By: Misty Stanley M.D.   On: 05/31/2019 15:16   US RENAL  Result Date: 06/01/2019 CLINICAL DATA:  Acute kidney injury. EXAM: RENAL / URINARY TRACT ULTRASOUND COMPLETE COMPARISON:  CT 05/31/2019. FINDINGS: Right Kidney: Renal measurements: 10.9 x 5.3 x 5.2 cm = volume: 158.0 mL. Mild increased echogenicity. Two tiny simple cysts measuring up to 1.3 cm. No hydronephrosis visualized. 12 mm nonobstructing calculus. Left Kidney: Renal measurements: 10.5 x 6.0 x 5.7 cm = volume: 185.6 mL. Mild increased echogenicity. Two tiny simple cysts, the largest measures 0.6 cm. 3 mm nonobstructing calculus. No hydronephrosis visualized. Bladder: Bladder is nondistended. Bilateral ureteral jets noted. Urethral calcifications possibly representing stones again noted as noted on prior CT. Small amount of debris may be present in the bladder. Other: This was a  limited exam as the patient was unable to moved. IMPRESSION: 1. Mild bilateral increased renal echogenicity. No acute renal abnormality. No hydronephrosis. 2. Tiny simple cysts both kidneys. Nonobstructing 12 mm calculus right kidney and 3 mm calculus left kidney. 3. Ureteral calcifications possibly representing stones again noted as noted on prior CT. Small amount of debris may be present in the bladder. Electronically Signed   By: Marcello Moores  Register   On: 06/01/2019 06:54    Scheduled Meds: . apixaban  2.5 mg Oral BID  . calcium carbonate  500 mg Oral Daily  . cholecalciferol  1,000 Units Oral Daily  . insulin aspart  0-5 Units Subcutaneous QHS  . insulin aspart  0-9 Units Subcutaneous TID WC  . insulin glargine  15 Units Subcutaneous Daily  . sodium chloride flush  3 mL Intravenous Once   Continuous Infusions: . sodium chloride    . cefTRIAXone (ROCEPHIN)  IV Stopped (06/01/19 1235)  . norepinephrine (LEVOPHED) Adult infusion    . sodium chloride      Assessment/Plan:  1. Septic shock, with acute kidney injury, acute cystitis with hematuria.  Present on admission.  Despite fluid boluses blood  pressure on the lower side.  I was hoping to get her downgraded to the floor but still requires close monitoring.  Add pressors if needed to maintain blood pressure.  Follow-up blood cultures and urine cultures.  Case discussed with critical care specialist.  Patient on  Rocephin. 2. Diabetic ketoacidosis.  Tapered off insulin drip and on Lantus and sliding scale insulin at this time.  Hemoglobin A1c very elevated at 11.4. 3. Acute kidney injury.  IV fluid and IV fluid boluses.  Likely ATN from hypotension.  Continue to monitor kidney function closely 4. Chronic atrial fibrillation.  Because of kidney function sotalol contraindicated.  May need to use amiodarone for now until kidney function improved.  Lowered the dose of Eliquis also. 5. Kidney stones.  Case discussed with Dr. Bernardo Heater urology who  reviewed the CT scan and renal ultrasound.  There are no obstructing stones.  Renal ultrasound should be read as stone in the urethra not in the ureter. 6. Hyponatremia IV fluid hydration 7. Lactic acidosis secondary to sepsis.  Hold Metformin  Code Status:     Code Status Orders  (From admission, onward)         Start     Ordered   05/31/19 1907  Full code  Continuous     05/31/19 1908        Code Status History    This patient has a current code status but no historical code status.   Advance Care Planning Activity     Family Communication: Spoke with sister on the phone Disposition Plan: Patient will be admitted to the ICU with septic shock likely will need a few days of IV antibiotics here in the hospital before any disposition can be made.  Consultants:  Critical care spent  Antibiotics:  Rocephin  Time spent: 28 minutes  [place]

## 2019-06-01 NOTE — ED Notes (Signed)
Pt breakfast tray set up on table, placed across pt lap. Pt L hand cleaned with wipe (R arm doesn't move). This RN made sure pt was sitting up comfortably and that pt could reach all her food and drink options. This RN opened each container for pt. Pt verbalized being thankful.

## 2019-06-01 NOTE — Progress Notes (Signed)
Inpatient Diabetes Program Recommendations  AACE/ADA: New Consensus Statement on Inpatient Glycemic Control   Target Ranges:  Prepandial:   less than 140 mg/dL      Peak postprandial:   less than 180 mg/dL (1-2 hours)      Critically ill patients:  140 - 180 mg/dL   Results for KOBIE, MATKINS (MRN [id number]) as of 06/01/2019 07:59  Ref. Range 06/01/2019 00:11 06/01/2019 01:41 06/01/2019 03:20 06/01/2019 04:48 06/01/2019 06:07 06/01/2019 07:30  Glucose-Capillary Latest Ref Range: 70 - 99 mg/dL 120 (H) 111 (H) 106 (H) 124 (H) 147 (H) 135 (H)  Results for SOKHNA, CHRISTOPH (MRN [id number]) as of 06/01/2019 07:59  Ref. Range 05/31/2019 17:41 05/31/2019 19:27 05/31/2019 21:26 05/31/2019 23:00  Glucose-Capillary Latest Ref Range: 70 - 99 mg/dL 246 (H) 254 (H) 177 (H) 159 (H)  Results for JAZLEN, OGARRO (MRN [id number]) as of 06/01/2019 07:59  Ref. Range 08/21/2018 10:29 05/31/2019 19:07  Hemoglobin A1C Latest Ref Range: 4.8 - 5.6 % 11.3 (H) 11.4 (H)   Review of Glycemic Control  Diabetes history: DM2 Outpatient Diabetes medications: Jardiance 25 mg daily, Glipizide XL 5 mg QAM, Metformin 381 mg BID, Trulicity 1.5 mg Qweek Current orders for Inpatient glycemic control: IV insulin; transitioning to Lantus 15 units daily, Novolog 0-9 units TID with meals, Novolog 0-5 units QHS  Inpatient Diabetes Program Recommendations:   HgbA1C: A1C 11.4% on 05/31/19 indicating an average glucose of 280 mg/dl over the past 2-3 months. If patient is taking DM medications as noted above, anticipate she will need adjustments made to outpatient DM medications to improve DM control.   NOTE: Noted consult for DM management evaluation. Chart reviewed. Per chart, patient is from group home and has cognitive dysfunction at baseline. Noted A1C 11.3% on 08/21/18 and 11.4% on 05/31/19 indicating an average glucose of 280 mg/dl. Patient admitted with DKA with initial glucose of 367 mg/dl on 05/31/19 and was started on IV insulin. Glucose has improved  and MD has ordered SQ insulin orders this morning for transition off IV insulin.  Patient would benefit from assistance with improving DM control and likely needs changes to outpatient DM medications.   Went to Emergency Room to talk with patient and she is sitting up in bed with emesis back on chest and vomit on shirt. Assisted patient to clean up. Patient states that she checks her own glucose and takes her own medications. Inquired about what DM medications she takes and patient could not verbalize them but reported she has them all in her medication bag at bedside. Looked through medication bag and noted bottles of medicine for Jardiance 25 mg daily, Glipizide XL 5 mg daily, and Metformin XR 1000 mg BID.  Asked patient about her once a week injection and patient became tearful and stated that she could not remember her medications. Provided emotional support and patient calmed back down easily. Asked about how her glucose was running and she again became tearful. Provided emotional support and informed patient that I would call her sister to see if I could get more information if she preferred. Patient gave permission to call her sister. Tried to call patient's sister [name] [phone]) but no answer and not able to leave a message.  Thanks, Barnie Alderman, RN, MSN, CDE Diabetes Coordinator Inpatient Diabetes Program [phone] (Team Pager from 8am to 5pm)

## 2019-06-01 NOTE — ED Notes (Signed)
Per hospitalist Dr. Vito Berger, ok to DC insulin drip at this time.

## 2019-06-01 NOTE — ED Notes (Signed)
Pt defecated, soft brown BM. Cleaned with wipes. New brief, chuck, and linen placed.

## 2019-06-01 NOTE — ED Notes (Signed)
Diabetes coordinator at bedside. Pt still dry heaving. Lunch tray placed at bedside.

## 2019-06-01 NOTE — ED Notes (Signed)
Pt called out stating that she had urinated into brief. Pt able to turn onto R side, cleaned with wipes. New brief placed on pt. Pt screams when touched on R side. Pt verbalized comfort with dry brief.

## 2019-06-01 NOTE — ED Notes (Signed)
Pt began vomiting. meds given.

## 2019-06-01 NOTE — ED Notes (Signed)
This RN and Tricia, NT, changed pt linens and undergarments which were soiled in BM. Tricia placed moisture barrier cream and no redness was noted at this time.

## 2019-06-01 NOTE — ED Notes (Signed)
Pt changed into clean brief and repositioned in bed at this time.

## 2019-06-01 NOTE — ED Notes (Signed)
Pt given warm blanket. Pt is alert and oriented, and able to follow commands. Pt respirations are equal and unlabred, no signs of acute distress.

## 2019-06-02 ENCOUNTER — Inpatient Hospital Stay (HOSPITAL_COMMUNITY)
Admit: 2019-06-02 | Discharge: 2019-06-02 | Disposition: A | Discharge status: Home / Self Care | Payer: Medicare HMO | Attending: Pulmonary Disease | Admitting: Pulmonary Disease

## 2019-06-02 DIAGNOSIS — N170 Acute kidney failure with tubular necrosis: Secondary | ICD-10-CM

## 2019-06-02 DIAGNOSIS — E1110 Type 2 diabetes mellitus with ketoacidosis without coma: Principal | ICD-10-CM

## 2019-06-02 DIAGNOSIS — R6521 Severe sepsis with septic shock: Secondary | ICD-10-CM

## 2019-06-02 DIAGNOSIS — A419 Sepsis, unspecified organism: Secondary | ICD-10-CM

## 2019-06-02 DIAGNOSIS — I361 Nonrheumatic tricuspid (valve) insufficiency: Secondary | ICD-10-CM

## 2019-06-02 LAB — BLOOD GAS, VENOUS
Acid-base deficit: 8.9 mmol/L — ABNORMAL HIGH (ref 0.0–2.0)
Bicarbonate: 19.5 mmol/L — ABNORMAL LOW (ref 20.0–28.0)
O2 Saturation: 27.9 %
Patient temperature: 37
pCO2, Ven: 51 mmHg (ref 44.0–60.0)
pH, Ven: 7.19 — CL (ref 7.250–7.430)

## 2019-06-02 LAB — GLUCOSE, CAPILLARY
Glucose-Capillary: 172 mg/dL — ABNORMAL HIGH (ref 70–99)
Glucose-Capillary: 279 mg/dL — ABNORMAL HIGH (ref 70–99)
Glucose-Capillary: 274 mg/dL — ABNORMAL HIGH (ref 70–99)
Glucose-Capillary: 290 mg/dL — ABNORMAL HIGH (ref 70–99)
Glucose-Capillary: 250 mg/dL — ABNORMAL HIGH (ref 70–99)

## 2019-06-02 LAB — CBC WITH DIFFERENTIAL/PLATELET
Abs Immature Granulocytes: 0.04 10*3/uL (ref 0.00–0.07)
Basophils Absolute: 0.1 10*3/uL (ref 0.0–0.1)
Basophils Relative: 0 %
Eosinophils Absolute: 0.1 10*3/uL (ref 0.0–0.5)
Eosinophils Relative: 1 %
HCT: 43.1 % (ref 36.0–46.0)
Hemoglobin: 14.2 g/dL (ref 12.0–15.0)
Immature Granulocytes: 0 %
Lymphocytes Relative: 11 %
Lymphs Abs: 1.4 10*3/uL (ref 0.7–4.0)
MCH: 28.5 pg (ref 26.0–34.0)
MCHC: 32.9 g/dL (ref 30.0–36.0)
MCV: 86.5 fL (ref 80.0–100.0)
Monocytes Absolute: 1.3 10*3/uL — ABNORMAL HIGH (ref 0.1–1.0)
Monocytes Relative: 10 %
Neutro Abs: 10.3 10*3/uL — ABNORMAL HIGH (ref 1.7–7.7)
Neutrophils Relative %: 78 %
Platelets: 318 10*3/uL (ref 150–400)
RBC: 4.98 MIL/uL (ref 3.87–5.11)
RDW: 12.9 % (ref 11.5–15.5)
WBC: 13.2 10*3/uL — ABNORMAL HIGH (ref 4.0–10.5)
nRBC: 0 % (ref 0.0–0.2)

## 2019-06-02 LAB — BASIC METABOLIC PANEL
Anion gap: 12 (ref 5–15)
BUN: 73 mg/dL — ABNORMAL HIGH (ref 8–23)
CO2: 23 mmol/L (ref 22–32)
Calcium: 8.5 mg/dL — ABNORMAL LOW (ref 8.9–10.3)
Chloride: 105 mmol/L (ref 98–111)
Creatinine, Ser: 2.81 mg/dL — ABNORMAL HIGH (ref 0.44–1.00)
GFR calc Af Amer: 19 mL/min — ABNORMAL LOW (ref >60)
GFR calc non Af Amer: 17 mL/min — ABNORMAL LOW (ref >60)
Glucose, Bld: 184 mg/dL — ABNORMAL HIGH (ref 70–99)
Potassium: 3.7 mmol/L (ref 3.5–5.1)
Sodium: 140 mmol/L (ref 135–145)

## 2019-06-02 LAB — ECHOCARDIOGRAM COMPLETE
Height: 66 in
Weight: 3344 oz

## 2019-06-02 LAB — PROCALCITONIN: Procalcitonin: 0.1 ng/mL

## 2019-06-02 MED ORDER — LACTATED RINGERS IV BOLUS
500.0000 mL | Freq: Once | INTRAVENOUS | Status: AC
  Administered 2019-06-02: 500 mL via INTRAVENOUS

## 2019-06-02 MED ORDER — LACTATED RINGERS IV BOLUS
1000.0000 mL | Freq: Once | INTRAVENOUS | Status: AC
  Administered 2019-06-02: 1000 mL via INTRAVENOUS

## 2019-06-02 MED ORDER — SODIUM CHLORIDE 0.9 % IV SOLN
1.0000 g | INTRAVENOUS | Status: AC
  Administered 2019-06-03 – 2019-06-04 (×2): 1 g via INTRAVENOUS
  Filled 2019-06-02 (×2): qty 1

## 2019-06-02 MED ORDER — LACTATED RINGERS IV SOLN
INTRAVENOUS | Status: DC
  Administered 2019-06-04: 50 mL/h via INTRAVENOUS

## 2019-06-02 MED ORDER — APIXABAN 5 MG PO TABS
5.0000 mg | ORAL_TABLET | Freq: Two times a day (BID) | ORAL | Status: DC
  Administered 2019-06-02 – 2019-06-07 (×10): 5 mg via ORAL
  Filled 2019-06-02 (×10): qty 1

## 2019-06-02 MED ORDER — INSULIN ASPART 100 UNIT/ML ~~LOC~~ SOLN
0.0000 [IU] | Freq: Three times a day (TID) | SUBCUTANEOUS | Status: DC
  Administered 2019-06-02: 8 [IU] via SUBCUTANEOUS
  Administered 2019-06-02: 3 [IU] via SUBCUTANEOUS
  Administered 2019-06-02: 8 [IU] via SUBCUTANEOUS
  Administered 2019-06-03: 5 [IU] via SUBCUTANEOUS
  Administered 2019-06-03: 11 [IU] via SUBCUTANEOUS
  Administered 2019-06-03 – 2019-06-04 (×2): 3 [IU] via SUBCUTANEOUS
  Administered 2019-06-04 (×2): 11 [IU] via SUBCUTANEOUS
  Administered 2019-06-05 (×2): 5 [IU] via SUBCUTANEOUS
  Administered 2019-06-05: 3 [IU] via SUBCUTANEOUS
  Administered 2019-06-06: 8 [IU] via SUBCUTANEOUS
  Administered 2019-06-06: 15 [IU] via SUBCUTANEOUS
  Administered 2019-06-06: 8 [IU] via SUBCUTANEOUS
  Administered 2019-06-07: 11 [IU] via SUBCUTANEOUS
  Administered 2019-06-07: 8 [IU] via SUBCUTANEOUS
  Filled 2019-06-02 (×17): qty 1

## 2019-06-02 MED ORDER — INSULIN GLARGINE 100 UNIT/ML ~~LOC~~ SOLN
24.0000 [IU] | Freq: Every day | SUBCUTANEOUS | Status: DC
  Administered 2019-06-02 – 2019-06-06 (×5): 24 [IU] via SUBCUTANEOUS
  Filled 2019-06-02 (×5): qty 0.24

## 2019-06-02 NOTE — Progress Notes (Addendum)
PROGRESS NOTE                                                                                                                                                                                                             Patient Demographics:    Sandra Massey, is a 69 y.o. female, DOB - [birth year], ZOX:[id number]  Admit date - 05/31/2019   Admitting Physician Loletha Grayer, MD  Outpatient Primary MD for the patient is [name], MD  LOS - 2  Outpatient Specialists: None  Chief Complaint  Patient presents with  . Weakness  . Nausea  . Emesis       Brief Narrative 69 year old obese female with uncontrolled type 2 diabetes mellitus, chronic A. fib on Eliquis who presented with septic shock secondary to UTI with acute kidney injury and hypotension requiring stepdown monitoring with fluid boluses and IV Levophed. She was also found to be in DKA.   Subjective:   Patient seen and examined.  Reports feeling tired.  Communicates poorly.   Assessment  & Plan :   Principal problem Severe sepsis with septic shock ([place]) Likely secondary to UTI.  Blood pressure not improved with fluid boluses and requiring Levophed through peripheral IV.  Continue IV Rocephin . Blood and urine .  Cultures so far negative.Cultures so far negative. . wean pressors as tolerated.  Active problems Uncontrolled diabetes mellitus type 2 with diabetic ketoacidosis Ketoacidosis resolved and off insulin drip.  Monitor on subcu Lantus with sliding scale coverage.  A1c of 11.4.  Lantus dose increased to 24 units daily.   Diabetes coordinator following.  Discontinue Metformin.  On Jardiance at home which will be discontinued upon discharge given risk for UTI and DKA's.  Acute kidney injury Likely prerenal ATN with septic shock.  Renal function slowly improving in a.m. lab.  Avoid nephrotoxins.  If no significant improvement will consult nephrology.   Renal ultrasound without postobstructive changes.  Lactic acidosis Secondary to sepsis.  Metformin discontinued.  Chronic atrial fibrillation Rate controlled.  Resume sotalol as renal function improving.  Continue Eliquis.  Follow 2D echo.  Bilateral nephrolithiasis CT abdomen shows Stable bilateral nephrolithiasis, bladder calculus, and calculi within a urethral diverticulum. No obstructive uropathy.  CT findings were discussed by hospitalist with urology who recommended no intervention needed.  Code Status : Full code  Family Communication  :  None  Disposition Plan  : Continue stepdown monitoring given requirement of pressors  Barriers For Discharge : Septic shock  Consults  : Critical care  Procedures  : 2D echo  DVT Prophylaxis  : Eliquis  Lab Results  Component Value Date   PLT 318 06/02/2019    Antibiotics  :    Anti-infectives (From admission, onward)   Start     Dose/Rate Route Frequency Ordered Stop   06/03/19 1000  cefTRIAXone (ROCEPHIN) 1 g in sodium chloride 0.9 % 100 mL IVPB     1 g 200 mL/hr over 30 Minutes Intravenous Every 24 hours 06/02/19 1049 06/04/19 0959   06/01/19 1000  cefTRIAXone (ROCEPHIN) 2 g in sodium chloride 0.9 % 100 mL IVPB  Status:  Discontinued     2 g 200 mL/hr over 30 Minutes Intravenous Every 24 hours 05/31/19 1901 06/02/19 1049   05/31/19 1545  ceFEPIme (MAXIPIME) 2 g in sodium chloride 0.9 % 100 mL IVPB     2 g 200 mL/hr over 30 Minutes Intravenous  Once 05/31/19 1541 05/31/19 1710   05/31/19 1545  metroNIDAZOLE (FLAGYL) IVPB 500 mg     500 mg 100 mL/hr over 60 Minutes Intravenous  Once 05/31/19 1541 05/31/19 1924        Objective:   Vitals:   06/02/19 1030 06/02/19 1045 06/02/19 1100 06/02/19 1115  BP: (!) 85/55 (!) 83/57 (!) 92/56 (!) 85/49  Pulse: 70 (!) 51 60 64  Resp: 19 17 18 17   Temp:      TempSrc:      SpO2: 98% 93% 95% 99%  Weight:      Height:        Wt Readings from Last 3 Encounters:  05/31/19 94.8 kg    07/09/13 104.3 kg  12/25/12 108 kg     Intake/Output Summary (Last 24 hours) at 06/02/2019 1459 Last data filed at 06/02/2019 0941 Gross per 24 hour  Intake 3770.47 ml  Output 3700 ml  Net 70.47 ml     Physical Exam  Gen: Fatigued, not in distress HEENT: Moist mucosa, supple neck Chest: clear b/l, no added sounds CVS: S1 and S2 regular, no murmurs GI: soft, NT, ND, BS+ Musculoskeletal: warm, no edema     Data Review:    CBC Recent Labs  Lab 05/31/19 1407 06/01/19 0446 06/02/19 0404  WBC 12.4* 16.1* 13.2*  HGB 16.4* 14.1 14.2  HCT 48.3* 42.1 43.1  PLT 368 312 318  MCV 85.5 87.2 86.5  MCH 29.0 29.2 28.5  MCHC 34.0 33.5 32.9  RDW 12.7 12.9 12.9  LYMPHSABS  --  1.9 1.4  MONOABS  --  1.2* 1.3*  EOSABS  --  0.1 0.1  BASOSABS  --  0.1 0.1    Chemistries  Recent Labs  Lab 05/31/19 1407 05/31/19 2129 05/31/19 2350 06/01/19 0319 06/01/19 0747 06/01/19 1213 06/02/19 0404  NA 125*   < > 131* 133* 131* 130* 140  K 5.4*   < > 3.9 4.1 3.9 4.1 3.7  CL 87*   < > 97* 99 98 97* 105  CO2 17*   < > 21* 21* 21* 21* 23  GLUCOSE 367*   < > 123* 116* 180* 288* 184*  BUN 84*   < > 80* 81* 77* 75* 73*  CREATININE 4.01*   < > 3.56* 3.59* 3.52* 3.37* 2.81*  CALCIUM 9.0   < > 8.0* 8.2* 8.1* 7.9* 8.5*  AST 18  --   --   --   --   --   --  ALT 29  --   --   --   --   --   --   ALKPHOS 98  --   --   --   --   --   --   BILITOT 1.1  --   --   --   --   --   --    < > = values in this interval not displayed.   ------------------------------------------------------------------------------------------------------------------ No results for input(s): CHOL, HDL, LDLCALC, TRIG, CHOLHDL, LDLDIRECT in the last 72 hours.  Lab Results  Component Value Date   HGBA1C 11.4 (H) 05/31/2019   ------------------------------------------------------------------------------------------------------------------ No results for input(s): TSH, T4TOTAL, T3FREE, THYROIDAB in the last 72  hours.  Invalid input(s): FREET3 ------------------------------------------------------------------------------------------------------------------ No results for input(s): VITAMINB12, FOLATE, FERRITIN, TIBC, IRON, RETICCTPCT in the last 72 hours.  Coagulation profile No results for input(s): INR, PROTIME in the last 168 hours.  No results for input(s): DDIMER in the last 72 hours.  Cardiac Enzymes No results for input(s): CKMB, TROPONINI, MYOGLOBIN in the last 168 hours.  Invalid input(s): CK ------------------------------------------------------------------------------------------------------------------ No results found for: BNP  Inpatient Medications  Scheduled Meds: . amiodarone  200 mg Oral Daily  . apixaban  5 mg Oral BID  . calcium carbonate  500 mg Oral Daily  . Chlorhexidine Gluconate Cloth  6 each Topical Daily  . cholecalciferol  1,000 Units Oral Daily  . insulin aspart  0-15 Units Subcutaneous TID WC  . insulin aspart  0-5 Units Subcutaneous QHS  . insulin glargine  24 Units Subcutaneous Daily  . sodium chloride flush  3 mL Intravenous Once   Continuous Infusions: . sodium chloride Stopped (06/01/19 1600)  . [START ON 06/03/2019] cefTRIAXone (ROCEPHIN)  IV    . lactated ringers Stopped (06/02/19 1300)  . norepinephrine (LEVOPHED) Adult infusion Stopped (06/02/19 1300)   PRN Meds:.acetaminophen, dextrose, HYDROcodone-acetaminophen, magnesium hydroxide, morphine injection, ondansetron (ZOFRAN) IV, promethazine, traZODone  Micro Results Recent Results (from the past 240 hour(s))  Urine Culture     Status: None   Collection Time: 05/31/19  2:49 PM   Specimen: Urine, Random  Result Value Ref Range Status   Specimen Description   Final    URINE, RANDOM Performed at [facility], [place] [address] [place], [place] [address]    Special Requests   Final    NONE Performed at [facility], [address] [place], [place] [address]     Culture   Final    NO GROWTH Performed at [facility], [place] [address] [place], [place] [address]    Report Status 06/01/2019 FINAL  Final  Blood culture (routine x 2)     Status: None (Preliminary result)   Collection Time: 05/31/19  3:33 PM   Specimen: BLOOD  Result Value Ref Range Status   Specimen Description BLOOD BLOOD RIGHT FOREARM  Final   Special Requests   Final    BOTTLES DRAWN AEROBIC AND ANAEROBIC Blood Culture adequate volume   Culture   Final    NO GROWTH 2 DAYS Performed at [facility], [address], [place], [place] [address]    Report Status PENDING  Incomplete  Blood culture (routine x 2)     Status: None (Preliminary result)   Collection Time: 05/31/19  3:38 PM   Specimen: BLOOD  Result Value Ref Range Status   Specimen Description BLOOD BLOOD RIGHT ARM  Final   Special Requests   Final    BOTTLES DRAWN AEROBIC  AND ANAEROBIC Blood Culture adequate volume   Culture   Final    NO GROWTH 2 DAYS Performed at [facility], [address] [place], [place] [address]    Report Status PENDING  Incomplete  Respiratory Panel by RT PCR (Flu A&B, Covid) - Nasopharyngeal Swab     Status: None   Collection Time: 05/31/19  3:55 PM   Specimen: Nasopharyngeal Swab  Result Value Ref Range Status   SARS Coronavirus 2 by RT PCR NEGATIVE NEGATIVE Final    Comment: (NOTE) SARS-CoV-2 target nucleic acids are NOT DETECTED. The SARS-CoV-2 RNA is generally detectable in upper respiratoy specimens during the acute phase of infection. The lowest concentration of SARS-CoV-2 viral copies this assay can detect is 131 copies/mL. A negative result does not preclude SARS-Cov-2 infection and should not be used as the sole basis for treatment or other patient management decisions. A negative result may occur with  improper specimen collection/handling, submission of specimen other than nasopharyngeal swab, presence of viral mutation(s) within the areas  targeted by this assay, and inadequate number of viral copies (<131 copies/mL). A negative result must be combined with clinical observations, patient history, and epidemiological information. The expected result is Negative. Fact Sheet for Patients:  PinkCheek.be Fact Sheet for Healthcare Providers:  GravelBags.it This test is not yet ap proved or cleared by the [place] FDA and  has been authorized for detection and/or diagnosis of SARS-CoV-2 by FDA under an Emergency Use Authorization (EUA). This EUA will remain  in effect (meaning this test can be used) for the duration of the COVID-19 declaration under Section 564(b)(1) of the Act, 21 U.S.C. section 360bbb-3(b)(1), unless the authorization is terminated or revoked sooner.    Influenza A by PCR NEGATIVE NEGATIVE Final   Influenza B by PCR NEGATIVE NEGATIVE Final    Comment: (NOTE) The Xpert Xpress SARS-CoV-2/FLU/RSV assay is intended as an aid in  the diagnosis of influenza from Nasopharyngeal swab specimens and  should not be used as a sole basis for treatment. Nasal washings and  aspirates are unacceptable for Xpert Xpress SARS-CoV-2/FLU/RSV  testing. Fact Sheet for Patients: PinkCheek.be Fact Sheet for Healthcare Providers: GravelBags.it This test is not yet approved or cleared by the [place] FDA and  has been authorized for detection and/or diagnosis of SARS-CoV-2 by  FDA under an Emergency Use Authorization (EUA). This EUA will remain  in effect (meaning this test can be used) for the duration of the  Covid-19 declaration under Section 564(b)(1) of the Act, 21  U.S.C. section 360bbb-3(b)(1), unless the authorization is  terminated or revoked. Performed at [facility], [address] [place], [place] [address]   MRSA PCR Screening     Status: None   Collection Time: 06/01/19 10:33 PM    Specimen: Nasopharyngeal  Result Value Ref Range Status   MRSA by PCR NEGATIVE NEGATIVE Final    Comment:        The GeneXpert MRSA Assay (FDA approved for NASAL specimens only), is one component of a comprehensive MRSA colonization surveillance program. It is not intended to diagnose MRSA infection nor to guide or monitor treatment for MRSA infections. Performed at [facility], [address] [place], [place] [address]     Radiology Reports CT ABDOMEN PELVIS WO CONTRAST  Result Date: 05/31/2019 CLINICAL DATA:  Nausea and vomiting, hypotensive EXAM: CT ABDOMEN AND PELVIS WITHOUT CONTRAST TECHNIQUE: Multidetector CT imaging of the abdomen and pelvis was performed following the standard protocol without  IV contrast. COMPARISON:  03/17/2019 FINDINGS: Lower chest: No acute abnormality. Hepatobiliary: No focal liver abnormality is seen. Status post cholecystectomy. No biliary dilatation. Pancreas: Unremarkable. No pancreatic ductal dilatation or surrounding inflammatory changes. Spleen: Normal in size without focal abnormality. Adrenals/Urinary Tract: Nonobstructing 12 mm calculus lower pole right kidney. Nonobstructing 3 mm calculus lower pole left kidney. No obstructive uropathy within either kidney. Bilateral areas of renal cortical scarring and lobularity or stable. Left renal cysts unchanged. The ureters are unremarkable. Punctate calcification again seen within the dependent bladder near the urethral orifice, reference image 77. 3 mm calcifications in the lower pelvis near the urethra reference image 80 may reflect calculi within a urethral diverticulum. These are stable. Stomach/Bowel: Scattered diverticulosis within the descending and sigmoid colon, without acute diverticulitis. Normal appendix right lower quadrant. No bowel obstruction or ileus. Vascular/Lymphatic: Stable atherosclerosis throughout the abdominal aorta. No pathologic adenopathy. Reproductive: Uterus and  bilateral adnexa are unremarkable. Other: No abdominal wall hernia or abnormality. No abdominopelvic ascites. Musculoskeletal: No acute or significant osseous findings. IMPRESSION: 1. Stable bilateral nephrolithiasis, bladder calculus, and calculi within a urethral diverticulum. No obstructive uropathy. 2. Colonic diverticulosis without acute diverticulitis. 3. Aortic Atherosclerosis (ICD10-I70.0). Electronically Signed   By: Randa Ngo M.D.   On: 05/31/2019 17:05   DG Chest 2 View  Result Date: 05/31/2019 CLINICAL DATA:  Weakness and hypotension. EXAM: CHEST - 2 VIEW COMPARISON:  None. FINDINGS: The lungs are clear without focal pneumonia, edema, pneumothorax or pleural effusion. The cardiopericardial silhouette is within normal limits for size. Old left rib fractures noted. IMPRESSION: No active cardiopulmonary disease. Electronically Signed   By: Misty Stanley M.D.   On: 05/31/2019 15:16   US RENAL  Result Date: 06/01/2019 CLINICAL DATA:  Acute kidney injury. EXAM: RENAL / URINARY TRACT ULTRASOUND COMPLETE COMPARISON:  CT 05/31/2019. FINDINGS: Right Kidney: Renal measurements: 10.9 x 5.3 x 5.2 cm = volume: 158.0 mL. Mild increased echogenicity. Two tiny simple cysts measuring up to 1.3 cm. No hydronephrosis visualized. 12 mm nonobstructing calculus. Left Kidney: Renal measurements: 10.5 x 6.0 x 5.7 cm = volume: 185.6 mL. Mild increased echogenicity. Two tiny simple cysts, the largest measures 0.6 cm. 3 mm nonobstructing calculus. No hydronephrosis visualized. Bladder: Bladder is nondistended. Bilateral ureteral jets noted. Urethral calcifications possibly representing stones again noted as noted on prior CT. Small amount of debris may be present in the bladder. Other: This was a limited exam as the patient was unable to moved. IMPRESSION: 1. Mild bilateral increased renal echogenicity. No acute renal abnormality. No hydronephrosis. 2. Tiny simple cysts both kidneys. Nonobstructing 12 mm calculus right  kidney and 3 mm calculus left kidney. 3. Ureteral calcifications possibly representing stones again noted as noted on prior CT. Small amount of debris may be present in the bladder. Electronically Signed   By: Marcello Moores  Register   On: 06/01/2019 06:54    Time Spent in minutes  35  Coltrane Tugwell M.D on 06/02/2019 at 2:59 PM  Between 7am to 7pm - Pager - [phone]  After 7pm go to [email or web address] - password [facility]  [facility] -  Office  [phone]

## 2019-06-02 NOTE — Progress Notes (Signed)
*  PRELIMINARY RESULTS* Echocardiogram 2D Echocardiogram has been performed.  Sandra Massey 06/02/2019, 9:16 AM

## 2019-06-02 NOTE — Progress Notes (Signed)
Inpatient Diabetes Program Recommendations  AACE/ADA: New Consensus Statement on Inpatient Glycemic Control  Target Ranges:  Prepandial:   less than 140 mg/dL      Peak postprandial:   less than 180 mg/dL (1-2 hours)      Critically ill patients:  140 - 180 mg/dL  Results for Sandra Massey, Sandra Massey (MRN [id number]) as of 06/02/2019 06:45  Ref. Range 06/02/2019 04:04  Glucose Latest Ref Range: 70 - 99 mg/dL 184 (H)   Results for Sandra Massey, Sandra Massey (MRN [id number]) as of 06/02/2019 06:45  Ref. Range 06/01/2019 08:42 06/01/2019 10:15 06/01/2019 12:11 06/01/2019 17:08 06/01/2019 22:09  Glucose-Capillary Latest Ref Range: 70 - 99 mg/dL 142 (H)    Lantus 15 units 162 (H) 212 (H)  Novolog 3 units 285 (H)  Novolog 5 units 359 (H)  Novolog 5 units @22 :31  Novolog 10 units @ 23:54    Review of Glycemic Control  Diabetes history: DM2 Outpatient Diabetes medications: Jardiance 25 mg daily, Glipizide XL 5 mg QAM, Metformin 496 mg BID, Trulicity 1.5 mg Qweek Current orders for Inpatient glycemic control:  Lantus 15 units daily, Novolog 0-9 units TID with meals, Novolog 0-5 units QHS  Inpatient Diabetes Program Recommendations:   Insulin-Basal: Please consider increasing Lantus to 24 units daily (based on 94.8kg x 0.25 units).  Insulin Correction: Please consider increasing Novolog correction to moderate scale (0-15 units TID with meals).  Oral DM medications: Patient is taking Jardiance 25 mg daily as an outpatient. Jardiance increases risk of developing UTIs and DKA. May want to consider discontinuing Jardiance as an outpatient.  HgbA1C: A1C 11.4% on 05/31/19 indicating an average glucose of 280 mg/dl over the past 2-3 months. Patient reported she takes her own medications, anticipate she will need adjustments made to outpatient DM medications to improve DM control.   Thanks, Barnie Alderman, RN, MSN, CDE Diabetes Coordinator Inpatient Diabetes Program [phone] (Team Pager from 8am to 5pm)

## 2019-06-02 NOTE — Progress Notes (Signed)
Patient remains Aox4.  She was visted by her sister today whom she lives with.  Patient remains in good spirits.  Her vitals are WDL with a MAP sustaining above 2mmHg.  Levo was stopped around 1500.  Patient received 2L LR bolus and was changed form maintanence fluid of NS to LR @ 1105ml/hr.  She swallows her medicine well and consumed her meals.  She has had numerous urine incontinence episodes as her purewick tubing becomes kinked off from her movement and rolling in bed.  Her urine output has averaged around 2L for shift.  Rocephin was delivered. No events to note.

## 2019-06-03 DIAGNOSIS — E0865 Diabetes mellitus due to underlying condition with hyperglycemia: Secondary | ICD-10-CM

## 2019-06-03 LAB — GLUCOSE, CAPILLARY
Glucose-Capillary: 184 mg/dL — ABNORMAL HIGH (ref 70–99)
Glucose-Capillary: 221 mg/dL — ABNORMAL HIGH (ref 70–99)
Glucose-Capillary: 237 mg/dL — ABNORMAL HIGH (ref 70–99)
Glucose-Capillary: 248 mg/dL — ABNORMAL HIGH (ref 70–99)
Glucose-Capillary: 308 mg/dL — ABNORMAL HIGH (ref 70–99)

## 2019-06-03 LAB — BASIC METABOLIC PANEL
Anion gap: 8 (ref 5–15)
BUN: 48 mg/dL — ABNORMAL HIGH (ref 8–23)
CO2: 23 mmol/L (ref 22–32)
Calcium: 8.7 mg/dL — ABNORMAL LOW (ref 8.9–10.3)
Chloride: 109 mmol/L (ref 98–111)
Creatinine, Ser: 1.73 mg/dL — ABNORMAL HIGH (ref 0.44–1.00)
GFR calc Af Amer: 35 mL/min — ABNORMAL LOW (ref >60)
GFR calc non Af Amer: 30 mL/min — ABNORMAL LOW (ref >60)
Glucose, Bld: 207 mg/dL — ABNORMAL HIGH (ref 70–99)
Potassium: 4.2 mmol/L (ref 3.5–5.1)
Sodium: 140 mmol/L (ref 135–145)

## 2019-06-03 LAB — CBC WITH DIFFERENTIAL/PLATELET
Abs Immature Granulocytes: 0.03 10*3/uL (ref 0.00–0.07)
Basophils Absolute: 0.1 10*3/uL (ref 0.0–0.1)
Basophils Relative: 1 %
Eosinophils Absolute: 0.1 10*3/uL (ref 0.0–0.5)
Eosinophils Relative: 1 %
HCT: 39.4 % (ref 36.0–46.0)
Hemoglobin: 12.9 g/dL (ref 12.0–15.0)
Immature Granulocytes: 0 %
Lymphocytes Relative: 20 %
Lymphs Abs: 2.1 10*3/uL (ref 0.7–4.0)
MCH: 28.7 pg (ref 26.0–34.0)
MCHC: 32.7 g/dL (ref 30.0–36.0)
MCV: 87.6 fL (ref 80.0–100.0)
Monocytes Absolute: 0.8 10*3/uL (ref 0.1–1.0)
Monocytes Relative: 8 %
Neutro Abs: 7.1 10*3/uL (ref 1.7–7.7)
Neutrophils Relative %: 70 %
Platelets: 236 10*3/uL (ref 150–400)
RBC: 4.5 MIL/uL (ref 3.87–5.11)
RDW: 13.1 % (ref 11.5–15.5)
WBC: 10.2 10*3/uL (ref 4.0–10.5)
nRBC: 0 % (ref 0.0–0.2)

## 2019-06-03 LAB — PROCALCITONIN: Procalcitonin: <0.1 ng/mL

## 2019-06-03 MED ORDER — INSULIN ASPART 100 UNIT/ML ~~LOC~~ SOLN
3.0000 [IU] | Freq: Three times a day (TID) | SUBCUTANEOUS | Status: DC
  Administered 2019-06-03 – 2019-06-06 (×9): 3 [IU] via SUBCUTANEOUS
  Filled 2019-06-03 (×8): qty 1

## 2019-06-03 MED ORDER — LACTATED RINGERS IV BOLUS
500.0000 mL | Freq: Once | INTRAVENOUS | Status: AC
  Administered 2019-06-03: 500 mL via INTRAVENOUS

## 2019-06-03 NOTE — Progress Notes (Signed)
PROGRESS NOTE                                                                                                                                                                                                             Patient Demographics:    Sandra Massey, is a 69 y.o. female, DOB - 1950/05/01, PYK:[id number]  Admit date - 05/31/2019   Admitting Physician Loletha Grayer, MD  Outpatient Primary MD for the patient is [name], MD  LOS - 3  Outpatient Specialists: None  Chief Complaint  Patient presents with   Weakness   Nausea   Emesis       Brief Narrative 69 year old obese female with uncontrolled type 2 diabetes mellitus, chronic A. fib on Eliquis who presented with septic shock secondary to UTI with acute kidney injury and hypotension requiring stepdown monitoring with fluid boluses and IV Levophed. She was also found to be in DKA.   Subjective:   Feeling better today and communicating well.  Off pressors and on IV fluids.   Assessment  & Plan :   Principal problem Severe sepsis with septic shock ([place]) Likely secondary to UTI.  Required Levophed through peripheral IV.  Now off pressors this morning and has soft BP.  Continue IV fluids. Continue empiric IV Rocephin to treat for at least 7 days for UTI.  Cultures negative. Transfer to medical floor.  Active problems Uncontrolled diabetes mellitus type 2 with diabetic ketoacidosis Ketoacidosis resolved and off insulin drip.  Monitor on subcu Lantus with sliding scale coverage.  A1c of 11.4.  Lantus dose increased to 24 units daily.  Added low-dose Premeal aspart. Diabetes coordinator following.  Discontinue Metformin.  On Jardiance at home which will be discontinued upon discharge given risk for UTI and DKA's. Patient will need insulin upon discharge.  Acute kidney injury Likely prerenal ATN with septic shock.  Renal function improving in a.m. lab but  still not at baseline..  Avoid nephrotoxins.  If no significant improvement will consult nephrology.  Renal ultrasound without postobstructive changes.  Lactic acidosis Secondary to sepsis.  Metformin discontinued.  Chronic atrial fibrillation Rate controlled.  Holding sotalol given low blood pressure.  Continue amiodarone and Eliquis.  2D echo with normal EF and no wall motion abnormality.  Bilateral nephrolithiasis CT abdomen shows Stable bilateral nephrolithiasis, bladder calculus, and calculi within a  urethral diverticulum. No obstructive uropathy.  CT findings were discussed by hospitalist with urology who recommended no intervention needed.  Code Status : Full code  Family Communication  : None  Disposition Plan  : Transfer to medical floor.  PT evaluation.  Discharge possibly in the next 48 hours if blood pressure continues to remain stable and renal function improved further.  Barriers For Discharge : Improving sepsis, ATN  Consults  : Critical care  Procedures  : 2D echo  DVT Prophylaxis  : Eliquis  Lab Results  Component Value Date   PLT 236 06/03/2019    Antibiotics  :    Anti-infectives (From admission, onward)   Start     Dose/Rate Route Frequency Ordered Stop   06/03/19 1000  cefTRIAXone (ROCEPHIN) 1 g in sodium chloride 0.9 % 100 mL IVPB     1 g 200 mL/hr over 30 Minutes Intravenous Every 24 hours 06/02/19 1049 06/05/19 0959   06/01/19 1000  cefTRIAXone (ROCEPHIN) 2 g in sodium chloride 0.9 % 100 mL IVPB  Status:  Discontinued     2 g 200 mL/hr over 30 Minutes Intravenous Every 24 hours 05/31/19 1901 06/02/19 1049   05/31/19 1545  ceFEPIme (MAXIPIME) 2 g in sodium chloride 0.9 % 100 mL IVPB     2 g 200 mL/hr over 30 Minutes Intravenous  Once 05/31/19 1541 05/31/19 1710   05/31/19 1545  metroNIDAZOLE (FLAGYL) IVPB 500 mg     500 mg 100 mL/hr over 60 Minutes Intravenous  Once 05/31/19 1541 05/31/19 1924        Objective:   Vitals:   06/03/19 0800  06/03/19 0900 06/03/19 1000 06/03/19 1100  BP: 101/74 104/67 109/66 (!) 109/95  Pulse: 71 82 77 79  Resp: 13 20 (!) 21 (!) 23  Temp: 97.7 F (36.5 C)     TempSrc: Oral     SpO2: 96% 100% 100% 94%  Weight:      Height:        Wt Readings from Last 3 Encounters:  05/31/19 94.8 kg  07/09/13 104.3 kg  12/25/12 108 kg     Intake/Output Summary (Last 24 hours) at 06/03/2019 1528 Last data filed at 06/03/2019 1100 Gross per 24 hour  Intake 3456.62 ml  Output 3575 ml  Net -118.38 ml    Physical exam Not in distress HEENT: Moist mucosa, supple neck Chest: Clear CVs: S1-S2 regular, no murmurs GI: Soft, nondistended, nontender Musculoskeletal: Warm, no edema      Data Review:    CBC Recent Labs  Lab 05/31/19 1407 06/01/19 0446 06/02/19 0404 06/03/19 0354  WBC 12.4* 16.1* 13.2* 10.2  HGB 16.4* 14.1 14.2 12.9  HCT 48.3* 42.1 43.1 39.4  PLT 368 312 318 236  MCV 85.5 87.2 86.5 87.6  MCH 29.0 29.2 28.5 28.7  MCHC 34.0 33.5 32.9 32.7  RDW 12.7 12.9 12.9 13.1  LYMPHSABS  --  1.9 1.4 2.1  MONOABS  --  1.2* 1.3* 0.8  EOSABS  --  0.1 0.1 0.1  BASOSABS  --  0.1 0.1 0.1    Chemistries  Recent Labs  Lab 05/31/19 1407 05/31/19 2129 06/01/19 0319 06/01/19 0747 06/01/19 1213 06/02/19 0404 06/03/19 0354  NA 125*   < > 133* 131* 130* 140 140  K 5.4*   < > 4.1 3.9 4.1 3.7 4.2  CL 87*   < > 99 98 97* 105 109  CO2 17*   < > 21* 21* 21* 23 23  GLUCOSE  367*   < > 116* 180* 288* 184* 207*  BUN 84*   < > 81* 77* 75* 73* 48*  CREATININE 4.01*   < > 3.59* 3.52* 3.37* 2.81* 1.73*  CALCIUM 9.0   < > 8.2* 8.1* 7.9* 8.5* 8.7*  AST 18  --   --   --   --   --   --   ALT 29  --   --   --   --   --   --   ALKPHOS 98  --   --   --   --   --   --   BILITOT 1.1  --   --   --   --   --   --    < > = values in this interval not displayed.   ------------------------------------------------------------------------------------------------------------------ No results for input(s):  CHOL, HDL, LDLCALC, TRIG, CHOLHDL, LDLDIRECT in the last 72 hours.  Lab Results  Component Value Date   HGBA1C 11.4 (H) 05/31/2019   ------------------------------------------------------------------------------------------------------------------ No results for input(s): TSH, T4TOTAL, T3FREE, THYROIDAB in the last 72 hours.  Invalid input(s): FREET3 ------------------------------------------------------------------------------------------------------------------ No results for input(s): VITAMINB12, FOLATE, FERRITIN, TIBC, IRON, RETICCTPCT in the last 72 hours.  Coagulation profile No results for input(s): INR, PROTIME in the last 168 hours.  No results for input(s): DDIMER in the last 72 hours.  Cardiac Enzymes No results for input(s): CKMB, TROPONINI, MYOGLOBIN in the last 168 hours.  Invalid input(s): CK ------------------------------------------------------------------------------------------------------------------ No results found for: BNP  Inpatient Medications  Scheduled Meds:  amiodarone  200 mg Oral Daily   apixaban  5 mg Oral BID   calcium carbonate  500 mg Oral Daily   Chlorhexidine Gluconate Cloth  6 each Topical Daily   cholecalciferol  1,000 Units Oral Daily   insulin aspart  0-15 Units Subcutaneous TID WC   insulin aspart  0-5 Units Subcutaneous QHS   insulin glargine  24 Units Subcutaneous Daily   Continuous Infusions:  sodium chloride Stopped (06/01/19 1600)   cefTRIAXone (ROCEPHIN)  IV Stopped (06/03/19 1018)   lactated ringers 125 mL/hr at 06/03/19 1502   PRN Meds:.acetaminophen, dextrose, HYDROcodone-acetaminophen, magnesium hydroxide, morphine injection, ondansetron (ZOFRAN) IV, promethazine, traZODone  Micro Results Recent Results (from the past 240 hour(s))  Urine Culture     Status: None   Collection Time: 05/31/19  2:49 PM   Specimen: Urine, Random  Result Value Ref Range Status   Specimen Description   Final    URINE,  RANDOM Performed at [facility], [place] [address], [place], [place] [address]    Special Requests   Final    NONE Performed at [facility], [address] [place], [place] [address]    Culture   Final    NO GROWTH Performed at [facility], [place] [address] [place], [place] [address]    Report Status 06/01/2019 FINAL  Final  Blood culture (routine x 2)     Status: None (Preliminary result)   Collection Time: 05/31/19  3:33 PM   Specimen: BLOOD  Result Value Ref Range Status   Specimen Description BLOOD BLOOD RIGHT FOREARM  Final   Special Requests   Final    BOTTLES DRAWN AEROBIC AND ANAEROBIC Blood Culture adequate volume   Culture   Final    NO GROWTH 3 DAYS Performed at [facility], [address] [place], [place] [address]    Report Status PENDING  Incomplete  Blood culture (routine x 2)  Status: None (Preliminary result)   Collection Time: 05/31/19  3:38 PM   Specimen: BLOOD  Result Value Ref Range Status   Specimen Description BLOOD BLOOD RIGHT ARM  Final   Special Requests   Final    BOTTLES DRAWN AEROBIC AND ANAEROBIC Blood Culture adequate volume   Culture   Final    NO GROWTH 3 DAYS Performed at [facility], [address] [place], [place] [address]    Report Status PENDING  Incomplete  Respiratory Panel by RT PCR (Flu A&B, Covid) - Nasopharyngeal Swab     Status: None   Collection Time: 05/31/19  3:55 PM   Specimen: Nasopharyngeal Swab  Result Value Ref Range Status   SARS Coronavirus 2 by RT PCR NEGATIVE NEGATIVE Final    Comment: (NOTE) SARS-CoV-2 target nucleic acids are NOT DETECTED. The SARS-CoV-2 RNA is generally detectable in upper respiratoy specimens during the acute phase of infection. The lowest concentration of SARS-CoV-2 viral copies this assay can detect is 131 copies/mL. A negative result does not preclude SARS-Cov-2 infection and should not be used as the sole basis for treatment  or other patient management decisions. A negative result may occur with  improper specimen collection/handling, submission of specimen other than nasopharyngeal swab, presence of viral mutation(s) within the areas targeted by this assay, and inadequate number of viral copies (<131 copies/mL). A negative result must be combined with clinical observations, patient history, and epidemiological information. The expected result is Negative. Fact Sheet for Patients:  PinkCheek.be Fact Sheet for Healthcare Providers:  GravelBags.it This test is not yet ap proved or cleared by the [place] FDA and  has been authorized for detection and/or diagnosis of SARS-CoV-2 by FDA under an Emergency Use Authorization (EUA). This EUA will remain  in effect (meaning this test can be used) for the duration of the COVID-19 declaration under Section 564(b)(1) of the Act, 21 U.S.C. section 360bbb-3(b)(1), unless the authorization is terminated or revoked sooner.    Influenza A by PCR NEGATIVE NEGATIVE Final   Influenza B by PCR NEGATIVE NEGATIVE Final    Comment: (NOTE) The Xpert Xpress SARS-CoV-2/FLU/RSV assay is intended as an aid in  the diagnosis of influenza from Nasopharyngeal swab specimens and  should not be used as a sole basis for treatment. Nasal washings and  aspirates are unacceptable for Xpert Xpress SARS-CoV-2/FLU/RSV  testing. Fact Sheet for Patients: PinkCheek.be Fact Sheet for Healthcare Providers: GravelBags.it This test is not yet approved or cleared by the [place] FDA and  has been authorized for detection and/or diagnosis of SARS-CoV-2 by  FDA under an Emergency Use Authorization (EUA). This EUA will remain  in effect (meaning this test can be used) for the duration of the  Covid-19 declaration under Section 564(b)(1) of the Act, 21  U.S.C. section  360bbb-3(b)(1), unless the authorization is  terminated or revoked. Performed at [facility], [address] [place], [place] [address]   MRSA PCR Screening     Status: None   Collection Time: 06/01/19 10:33 PM   Specimen: Nasopharyngeal  Result Value Ref Range Status   MRSA by PCR NEGATIVE NEGATIVE Final    Comment:        The GeneXpert MRSA Assay (FDA approved for NASAL specimens only), is one component of a comprehensive MRSA colonization surveillance program. It is not intended to diagnose MRSA infection nor to guide or monitor treatment for MRSA infections. Performed at [facility], [address] [place], [place] [address]  Radiology Reports CT ABDOMEN PELVIS WO CONTRAST  Result Date: 05/31/2019 CLINICAL DATA:  Nausea and vomiting, hypotensive EXAM: CT ABDOMEN AND PELVIS WITHOUT CONTRAST TECHNIQUE: Multidetector CT imaging of the abdomen and pelvis was performed following the standard protocol without IV contrast. COMPARISON:  03/17/2019 FINDINGS: Lower chest: No acute abnormality. Hepatobiliary: No focal liver abnormality is seen. Status post cholecystectomy. No biliary dilatation. Pancreas: Unremarkable. No pancreatic ductal dilatation or surrounding inflammatory changes. Spleen: Normal in size without focal abnormality. Adrenals/Urinary Tract: Nonobstructing 12 mm calculus lower pole right kidney. Nonobstructing 3 mm calculus lower pole left kidney. No obstructive uropathy within either kidney. Bilateral areas of renal cortical scarring and lobularity or stable. Left renal cysts unchanged. The ureters are unremarkable. Punctate calcification again seen within the dependent bladder near the urethral orifice, reference image 77. 3 mm calcifications in the lower pelvis near the urethra reference image 80 may reflect calculi within a urethral diverticulum. These are stable. Stomach/Bowel: Scattered diverticulosis within the descending and sigmoid colon,  without acute diverticulitis. Normal appendix right lower quadrant. No bowel obstruction or ileus. Vascular/Lymphatic: Stable atherosclerosis throughout the abdominal aorta. No pathologic adenopathy. Reproductive: Uterus and bilateral adnexa are unremarkable. Other: No abdominal wall hernia or abnormality. No abdominopelvic ascites. Musculoskeletal: No acute or significant osseous findings. IMPRESSION: 1. Stable bilateral nephrolithiasis, bladder calculus, and calculi within a urethral diverticulum. No obstructive uropathy. 2. Colonic diverticulosis without acute diverticulitis. 3. Aortic Atherosclerosis (ICD10-I70.0). Electronically Signed   By: Randa Ngo M.D.   On: 05/31/2019 17:05   DG Chest 2 View  Result Date: 05/31/2019 CLINICAL DATA:  Weakness and hypotension. EXAM: CHEST - 2 VIEW COMPARISON:  None. FINDINGS: The lungs are clear without focal pneumonia, edema, pneumothorax or pleural effusion. The cardiopericardial silhouette is within normal limits for size. Old left rib fractures noted. IMPRESSION: No active cardiopulmonary disease. Electronically Signed   By: Misty Stanley M.D.   On: 05/31/2019 15:16   US RENAL  Result Date: 06/01/2019 CLINICAL DATA:  Acute kidney injury. EXAM: RENAL / URINARY TRACT ULTRASOUND COMPLETE COMPARISON:  CT 05/31/2019. FINDINGS: Right Kidney: Renal measurements: 10.9 x 5.3 x 5.2 cm = volume: 158.0 mL. Mild increased echogenicity. Two tiny simple cysts measuring up to 1.3 cm. No hydronephrosis visualized. 12 mm nonobstructing calculus. Left Kidney: Renal measurements: 10.5 x 6.0 x 5.7 cm = volume: 185.6 mL. Mild increased echogenicity. Two tiny simple cysts, the largest measures 0.6 cm. 3 mm nonobstructing calculus. No hydronephrosis visualized. Bladder: Bladder is nondistended. Bilateral ureteral jets noted. Urethral calcifications possibly representing stones again noted as noted on prior CT. Small amount of debris may be present in the bladder. Other: This was a  limited exam as the patient was unable to moved. IMPRESSION: 1. Mild bilateral increased renal echogenicity. No acute renal abnormality. No hydronephrosis. 2. Tiny simple cysts both kidneys. Nonobstructing 12 mm calculus right kidney and 3 mm calculus left kidney. 3. Ureteral calcifications possibly representing stones again noted as noted on prior CT. Small amount of debris may be present in the bladder. Electronically Signed   By: Marcello Moores  Register   On: 06/01/2019 06:54   ECHOCARDIOGRAM COMPLETE  Result Date: 06/02/2019    ECHOCARDIOGRAM REPORT   Patient Name:   NIJAE DOYEL Date of Exam: 06/02/2019 Medical Rec #:  [id number]    Height:       66.0 in Accession #:    [id number]   Weight:       209.0 lb Date of Birth:  1950-11-24   BSA:  2.038 m Patient Age:    42 years     BP:           107/73 mmHg Patient Gender: F            HR:           83 bpm. Exam Location:  [facility] Procedure: 2D Echo, Color Doppler and Cardiac Doppler Indications:     I48.91 Atrial fibrillation  History:         Patient has no prior history of Echocardiogram examinations.                  Risk Factors:Hypertension and Diabetes. Substance abuse.  Sonographer:     Charmayne Sheer RDCS (AE) Referring Phys:  [id number] Bradly Bienenstock Diagnosing Phys: Kate Sable MD IMPRESSIONS  1. Left ventricular ejection fraction, by estimation, is 55 to 60%. The left ventricle has normal function. The left ventricle has no regional wall motion abnormalities. Left ventricular diastolic parameters are indeterminate.  2. Right ventricular systolic function is normal. The right ventricular size is mildly enlarged. There is moderately elevated pulmonary artery systolic pressure.  3. Left atrial size was severely dilated.  4. Right atrial size was moderately dilated.  5. The mitral valve is normal in structure. No evidence of mitral valve regurgitation.  6. The aortic valve is tricuspid. Aortic valve regurgitation is not visualized.  7. The inferior vena  cava is dilated in size with <50% respiratory variability, suggesting right atrial pressure of 15 mmHg. FINDINGS  Left Ventricle: Left ventricular ejection fraction, by estimation, is 55 to 60%. The left ventricle has normal function. The left ventricle has no regional wall motion abnormalities. The left ventricular internal cavity size was normal in size. There is  no left ventricular hypertrophy. Left ventricular diastolic parameters are indeterminate. Right Ventricle: The right ventricular size is mildly enlarged. Right vetricular wall thickness was not assessed. Right ventricular systolic function is normal. There is moderately elevated pulmonary artery systolic pressure. The tricuspid regurgitant velocity is 2.94 m/s, and with an assumed right atrial pressure of 15 mmHg, the estimated right ventricular systolic pressure is 58.5 mmHg. Left Atrium: Left atrial size was severely dilated. Right Atrium: Right atrial size was moderately dilated. Pericardium: There is no evidence of pericardial effusion. Mitral Valve: The mitral valve is normal in structure. No evidence of mitral valve regurgitation. MV peak gradient, 5.9 mmHg. The mean mitral valve gradient is 1.0 mmHg. Tricuspid Valve: The tricuspid valve is grossly normal. Tricuspid valve regurgitation is mild. Aortic Valve: The aortic valve is tricuspid. Aortic valve regurgitation is not visualized. Aortic valve mean gradient measures 4.0 mmHg. Aortic valve peak gradient measures 9.1 mmHg. Aortic valve area, by VTI measures 1.88 cm. Pulmonic Valve: The pulmonic valve was normal in structure. Pulmonic valve regurgitation is not visualized. Aorta: The aortic root is normal in size and structure. Venous: The inferior vena cava was not well visualized. The inferior vena cava is dilated in size with less than 50% respiratory variability, suggesting right atrial pressure of 15 mmHg. IAS/Shunts: No atrial level shunt detected by color flow Doppler.  LEFT VENTRICLE PLAX  2D LVIDd:         4.94 cm  Diastology LVIDs:         3.39 cm  LV e' lateral:   13.10 cm/s LV PW:         0.94 cm  LV E/e' lateral: 8.8 LV IVS:        0.67 cm  LV  e' medial:    11.50 cm/s LVOT diam:     2.00 cm  LV E/e' medial:  10.0 LV SV:         52 LV SV Index:   25 LVOT Area:     3.14 cm  RIGHT VENTRICLE RV Basal diam:  4.37 cm LEFT ATRIUM             Index       RIGHT ATRIUM           Index LA diam:        5.20 cm 2.55 cm/m  RA Area:     18.20 cm LA Vol (A2C):   63.4 ml 31.12 ml/m RA Volume:   55.10 ml  27.04 ml/m LA Vol (A4C):   99.1 ml 48.64 ml/m LA Biplane Vol: 83.3 ml 40.88 ml/m  AORTIC VALVE                   PULMONIC VALVE AV Area (Vmax):    1.97 cm    PV Vmax:       0.86 m/s AV Area (Vmean):   1.99 cm    PV Vmean:      45.700 cm/s AV Area (VTI):     1.88 cm    PV VTI:        0.118 m AV Vmax:           151.00 cm/s PV Peak grad:  3.0 mmHg AV Vmean:          94.300 cm/s PV Mean grad:  1.0 mmHg AV VTI:            0.275 m AV Peak Grad:      9.1 mmHg AV Mean Grad:      4.0 mmHg LVOT Vmax:         94.80 cm/s LVOT Vmean:        59.700 cm/s LVOT VTI:          0.165 m LVOT/AV VTI ratio: 0.60  AORTA Ao Root diam: 2.70 cm MITRAL VALVE                TRICUSPID VALVE MV Area (PHT): 3.71 cm     TR Peak grad:   34.6 mmHg MV Peak grad:  5.9 mmHg     TR Vmax:        294.00 cm/s MV Mean grad:  1.0 mmHg MV Vmax:       1.21 m/s     SHUNTS MV Vmean:      52.6 cm/s    Systemic VTI:  0.16 m MV Decel Time: 204 msec     Systemic Diam: 2.00 cm MV E velocity: 115.33 cm/s MV A velocity: 115.67 cm/s MV E/A ratio:  1.00 Kate Sable MD Electronically signed by Kate Sable MD Signature Date/Time: 06/02/2019/3:15:02 PM    Final     Time Spent in minutes  35  Nami Strawder M.D on 06/03/2019 at 3:28 PM  Between 7am to 7pm - Pager - [phone]  After 7pm go to [email or web address] - password [facility]  [facility] -  Office  [phone]

## 2019-06-03 NOTE — Progress Notes (Signed)
BP (!) 109/95   Pulse 79   Temp 97.7 F (36.5 C) (Oral)   Resp (!) 23   Ht 5\' 6"  (1.676 m)   Wt 94.8 kg   SpO2 94%   BMI 33.73 kg/m   VS stable NO longer on pressors    Vital signs reviewed, ICU needs resolved  Will sign off at this time. No further recommendations at this time.  Please call [phone] for further questions. Thank you.    Corrin Parker, M.D.  [facility]  Medical Director [place] Director [facility] Cardio-Pulmonary Department

## 2019-06-03 NOTE — Progress Notes (Signed)
Patient remains Aox4.  Her cardiovascular dynamics have recovered as of this morning around 0800, the Levo was discontinued and patient has sustained nominal pressure after receiving a 1L LR bolus during the night shift.  Her breath sounds remain clear.  She consumed 100% of meals. Last B/M 3.23.  Multiple urine occurences and bed changes. Her urine output for the day shift was approx 2.5L.  Patient saturating well on room air. Sister and neighbors came to visit today.  Patient is in high spirits.

## 2019-06-03 NOTE — Progress Notes (Signed)
Per notes, patient came from group home. CSW met with patient at bedside. Patient reported she does not live at a group home, that she lives with her sister who provides support to her at home. CSW will continue to follow.  Oleh Genin, [place]

## 2019-06-03 NOTE — Progress Notes (Signed)
Inpatient Diabetes Program Recommendations  AACE/ADA: New Consensus Statement on Inpatient Glycemic Control (2015)  Target Ranges:  Prepandial:   less than 140 mg/dL      Peak postprandial:   less than 180 mg/dL (1-2 hours)      Critically ill patients:  140 - 180 mg/dL   Lab Results  Component Value Date   GLUCAP 248 (H) 06/03/2019   HGBA1C 11.4 (H) 05/31/2019    Review of Glycemic Control Results for Sandra Massey, Sandra Massey (MRN [id number]) as of 06/03/2019 12:37  Ref. Range 06/02/2019 19:43 06/02/2019 21:34 06/03/2019 00:01 06/03/2019 07:14 06/03/2019 11:25  Glucose-Capillary Latest Ref Range: 70 - 99 mg/dL 290 (H) 250 (H) 221 (H) 184 (H) 248 (H)  Diabetes history:DM2 Outpatient Diabetes medications:Jardiance 25 mg daily,GlipizideXL5 mg QAM, Metformin 829 mg BID, Trulicity 1.5 mg Qweek Current orders for Inpatient glycemic control:  Lantus 24 units daily, Novolog 0-15 units TID with meals, Novolog 0-5 units QHS  Inpatient Diabetes Program Recommendations:   Based on A1C, patient will likely need insulin at d/c.  While in the hospital, consider adding Novolog meal coverage 3 units tid with meals.   Thanks,  Adah Perl, RN, BC-ADM Inpatient Diabetes Coordinator Pager [phone] (8a-5p)

## 2019-06-04 DIAGNOSIS — R531 Weakness: Secondary | ICD-10-CM

## 2019-06-04 LAB — CBC WITH DIFFERENTIAL/PLATELET
Abs Immature Granulocytes: 0.05 10*3/uL (ref 0.00–0.07)
Basophils Absolute: 0 10*3/uL (ref 0.0–0.1)
Basophils Relative: 0 %
Eosinophils Absolute: 0.2 10*3/uL (ref 0.0–0.5)
Eosinophils Relative: 2 %
HCT: 38.7 % (ref 36.0–46.0)
Hemoglobin: 12.9 g/dL (ref 12.0–15.0)
Immature Granulocytes: 1 %
Lymphocytes Relative: 25 %
Lymphs Abs: 2.3 10*3/uL (ref 0.7–4.0)
MCH: 29 pg (ref 26.0–34.0)
MCHC: 33.3 g/dL (ref 30.0–36.0)
MCV: 87 fL (ref 80.0–100.0)
Monocytes Absolute: 0.5 10*3/uL (ref 0.1–1.0)
Monocytes Relative: 5 %
Neutro Abs: 6.2 10*3/uL (ref 1.7–7.7)
Neutrophils Relative %: 67 %
Platelets: 241 10*3/uL (ref 150–400)
RBC: 4.45 MIL/uL (ref 3.87–5.11)
RDW: 13.1 % (ref 11.5–15.5)
WBC: 9.2 10*3/uL (ref 4.0–10.5)
nRBC: 0 % (ref 0.0–0.2)

## 2019-06-04 LAB — BASIC METABOLIC PANEL
Anion gap: 8 (ref 5–15)
BUN: 39 mg/dL — ABNORMAL HIGH (ref 8–23)
CO2: 25 mmol/L (ref 22–32)
Calcium: 9.1 mg/dL (ref 8.9–10.3)
Chloride: 105 mmol/L (ref 98–111)
Creatinine, Ser: 1.39 mg/dL — ABNORMAL HIGH (ref 0.44–1.00)
GFR calc Af Amer: 45 mL/min — ABNORMAL LOW (ref >60)
GFR calc non Af Amer: 39 mL/min — ABNORMAL LOW (ref >60)
Glucose, Bld: 218 mg/dL — ABNORMAL HIGH (ref 70–99)
Potassium: 4.3 mmol/L (ref 3.5–5.1)
Sodium: 138 mmol/L (ref 135–145)

## 2019-06-04 LAB — GLUCOSE, CAPILLARY
Glucose-Capillary: 189 mg/dL — ABNORMAL HIGH (ref 70–99)
Glucose-Capillary: 239 mg/dL — ABNORMAL HIGH (ref 70–99)
Glucose-Capillary: 242 mg/dL — ABNORMAL HIGH (ref 70–99)
Glucose-Capillary: 311 mg/dL — ABNORMAL HIGH (ref 70–99)
Glucose-Capillary: 314 mg/dL — ABNORMAL HIGH (ref 70–99)

## 2019-06-04 MED ORDER — INSULIN STARTER KIT- PEN NEEDLES (ENGLISH)
1.0000 | Freq: Once | Status: DC
  Filled 2019-06-04: qty 1

## 2019-06-04 NOTE — Progress Notes (Signed)
Patient ID: Sandra Massey, female   DOB: September 04, 1950, 69 y.o.   MRN: [id number] [facility] PROGRESS NOTE  Sandra Massey SEG:[id number] DOB: 11/30/1950 DOA: 05/31/2019 PCP: [name], MD  HPI/Subjective: Patient feeling little bit better.  Feeling weak.  No abdominal pain.  No difficulty urinating.  Eating a little bit.  Objective: Vitals:   06/04/19 1000 06/04/19 1100  BP: 129/77 (!) 149/97  Pulse: 62 81  Resp: 17 20  Temp:    SpO2: 99% 99%    Intake/Output Summary (Last 24 hours) at 06/04/2019 1316 Last data filed at 06/04/2019 1215 Gross per 24 hour  Intake 2545.73 ml  Output 5500 ml  Net -2954.27 ml   Filed Weights   05/31/19 1356  Weight: 94.8 kg    ROS: Review of Systems  Constitutional: Positive for malaise/fatigue. Negative for fever.  Eyes: Negative for blurred vision.  Respiratory: Negative for shortness of breath.   Cardiovascular: Negative for chest pain.  Gastrointestinal: Negative for abdominal pain, nausea and vomiting.  Genitourinary: Negative for dysuria.  Musculoskeletal: Negative for joint pain.  Neurological: Negative for headaches.   Exam: Physical Exam  Constitutional: She is oriented to person, place, and time.  HENT:  Nose: No mucosal edema.  Mouth/Throat: No oropharyngeal exudate or posterior oropharyngeal edema.  Eyes: Conjunctivae and lids are normal.  Cardiovascular: S1 normal and S2 normal. Exam reveals no gallop.  No murmur heard. Respiratory: No respiratory distress. She has no wheezes. She has no rhonchi. She has no rales.  GI: Soft. Bowel sounds are normal. There is no abdominal tenderness.  Musculoskeletal:     Right ankle: Swelling present.     Left ankle: Swelling present.  Lymphadenopathy:    She has no cervical adenopathy.  Neurological: She is alert and oriented to person, place, and time. No cranial nerve deficit.  Skin: Skin is warm. No rash noted. Nails show no clubbing.  Psychiatric: She has a normal mood and  affect.      Data Reviewed: Basic Metabolic Panel: Recent Labs  Lab 06/01/19 0747 06/01/19 1213 06/02/19 0404 06/03/19 0354 06/04/19 0404  NA 131* 130* 140 140 138  K 3.9 4.1 3.7 4.2 4.3  CL 98 97* 105 109 105  CO2 21* 21* 23 23 25   GLUCOSE 180* 288* 184* 207* 218*  BUN 77* 75* 73* 48* 39*  CREATININE 3.52* 3.37* 2.81* 1.73* 1.39*  CALCIUM 8.1* 7.9* 8.5* 8.7* 9.1   Liver Function Tests: Recent Labs  Lab 05/31/19 1407  AST 18  ALT 29  ALKPHOS 98  BILITOT 1.1  PROT 8.1  ALBUMIN 4.2   Recent Labs  Lab 05/31/19 1407  LIPASE 111*   CBC: Recent Labs  Lab 05/31/19 1407 06/01/19 0446 06/02/19 0404 06/03/19 0354 06/04/19 0404  WBC 12.4* 16.1* 13.2* 10.2 9.2  NEUTROABS  --  12.8* 10.3* 7.1 6.2  HGB 16.4* 14.1 14.2 12.9 12.9  HCT 48.3* 42.1 43.1 39.4 38.7  MCV 85.5 87.2 86.5 87.6 87.0  PLT 368 312 318 236 241    CBG: Recent Labs  Lab 06/03/19 1125 06/03/19 1506 06/03/19 2111 06/04/19 0730 06/04/19 1137  GLUCAP 248* 308* 237* 189* 314*    Recent Results (from the past 240 hour(s))  Urine Culture     Status: None   Collection Time: 05/31/19  2:49 PM   Specimen: Urine, Random  Result Value Ref Range Status   Specimen Description   Final    URINE, RANDOM Performed at [facility],  [place] [address] [place], [place] [address]    Special Requests   Final    NONE Performed at [facility], [address] [place], [place] [address]    Culture   Final    NO GROWTH Performed at [facility], [place] [address], [place], [place] [address]    Report Status 06/01/2019 FINAL  Final  Blood culture (routine x 2)     Status: None (Preliminary result)   Collection Time: 05/31/19  3:33 PM   Specimen: BLOOD  Result Value Ref Range Status   Specimen Description BLOOD BLOOD RIGHT FOREARM  Final   Special Requests   Final    BOTTLES DRAWN AEROBIC AND ANAEROBIC Blood Culture adequate volume   Culture   Final    NO GROWTH 4  DAYS Performed at [facility], [address], [place], [place] [address]    Report Status PENDING  Incomplete  Blood culture (routine x 2)     Status: None (Preliminary result)   Collection Time: 05/31/19  3:38 PM   Specimen: BLOOD  Result Value Ref Range Status   Specimen Description BLOOD BLOOD RIGHT ARM  Final   Special Requests   Final    BOTTLES DRAWN AEROBIC AND ANAEROBIC Blood Culture adequate volume   Culture   Final    NO GROWTH 4 DAYS Performed at [facility], [address] [place],  [address]    Report Status PENDING  Incomplete  Respiratory Panel by RT PCR (Flu A&B, Covid) - Nasopharyngeal Swab     Status: None   Collection Time: 05/31/19  3:55 PM   Specimen: Nasopharyngeal Swab  Result Value Ref Range Status   SARS Coronavirus 2 by RT PCR NEGATIVE NEGATIVE Final    Comment: (NOTE) SARS-CoV-2 target nucleic acids are NOT DETECTED. The SARS-CoV-2 RNA is generally detectable in upper respiratoy specimens during the acute phase of infection. The lowest concentration of SARS-CoV-2 viral copies this assay can detect is 131 copies/mL. A negative result does not preclude SARS-Cov-2 infection and should not be used as the sole basis for treatment or other patient management decisions. A negative result may occur with  improper specimen collection/handling, submission of specimen other than nasopharyngeal swab, presence of viral mutation(s) within the areas targeted by this assay, and inadequate number of viral copies (<131 copies/mL). A negative result must be combined with clinical observations, patient history, and epidemiological information. The expected result is Negative. Fact Sheet for Patients:  PinkCheek.be Fact Sheet for Healthcare Providers:  GravelBags.it This test is not yet ap proved or cleared by the [place] FDA and  has been authorized for detection and/or  diagnosis of SARS-CoV-2 by FDA under an Emergency Use Authorization (EUA). This EUA will remain  in effect (meaning this test can be used) for the duration of the COVID-19 declaration under Section 564(b)(1) of the Act, 21 U.S.C. section 360bbb-3(b)(1), unless the authorization is terminated or revoked sooner.    Influenza A by PCR NEGATIVE NEGATIVE Final   Influenza B by PCR NEGATIVE NEGATIVE Final    Comment: (NOTE) The Xpert Xpress SARS-CoV-2/FLU/RSV assay is intended as an aid in  the diagnosis of influenza from Nasopharyngeal swab specimens and  should not be used as a sole basis for treatment. Nasal washings and  aspirates are unacceptable for Xpert Xpress SARS-CoV-2/FLU/RSV  testing. Fact Sheet for Patients: PinkCheek.be Fact Sheet for Healthcare Providers: GravelBags.it This test is not yet approved or cleared by the [place]  FDA and  has been authorized for detection and/or diagnosis of SARS-CoV-2 by  FDA under an Emergency Use Authorization (EUA). This EUA will remain  in effect (meaning this test can be used) for the duration of the  Covid-19 declaration under Section 564(b)(1) of the Act, 21  U.S.C. section 360bbb-3(b)(1), unless the authorization is  terminated or revoked. Performed at [facility], [address] [place], [place] [address]   MRSA PCR Screening     Status: None   Collection Time: 06/01/19 10:33 PM   Specimen: Nasopharyngeal  Result Value Ref Range Status   MRSA by PCR NEGATIVE NEGATIVE Final    Comment:        The GeneXpert MRSA Assay (FDA approved for NASAL specimens only), is one component of a comprehensive MRSA colonization surveillance program. It is not intended to diagnose MRSA infection nor to guide or monitor treatment for MRSA infections. Performed at [facility], [address] [place], [place] [address]       Scheduled Meds: . amiodarone  200  mg Oral Daily  . apixaban  5 mg Oral BID  . calcium carbonate  500 mg Oral Daily  . Chlorhexidine Gluconate Cloth  6 each Topical Daily  . cholecalciferol  1,000 Units Oral Daily  . insulin aspart  0-15 Units Subcutaneous TID WC  . insulin aspart  0-5 Units Subcutaneous QHS  . insulin aspart  3 Units Subcutaneous TID WC  . insulin glargine  24 Units Subcutaneous Daily   Continuous Infusions: . sodium chloride Stopped (06/01/19 1600)  . lactated ringers 50 mL/hr (06/04/19 1256)    Assessment/Plan:  1. Septic shock with acute kidney injury.  Patient on IV fluids.  Creatinine improving on a daily basis.  Patient required initial pressors but is off pressors at this point.  Okay to transfer to the medical floor.  Cultures negative.  Patient received antibiotics for a few days then discontinued 2. Diabetic ketoacidosis on presentation.  Now over to glargine insulin and sliding scale. 3. Acute kidney injury.  Improved with IV fluids 4. Chronic atrial fibrillation.  Sotalol was contraindicated secondary to acute kidney injury and I change the patient over to amiodarone.  Patient also on Eliquis. 5. Lactic acidosis secondary to sepsis 6. Weakness.  Physical therapy recommending rehab.  We will put in a consult to transitional care team.  Code Status:     Code Status Orders  (From admission, onward)         Start     Ordered   05/31/19 1907  Full code  Continuous     05/31/19 1908        Code Status History    This patient has a current code status but no historical code status.   Advance Care Planning Activity     Family Communication: Spoke with the patient's sister on the phone Disposition Plan: Physical therapy recommended rehab.  Will consult transitional care team to look into rehab possibilities.  If the patient gets stronger while here in the hospital can possibly do home with home health.  Patient will be transferred out of the ICU today.  Time spent: 28  minutes  [place]

## 2019-06-04 NOTE — Evaluation (Signed)
Physical Therapy Evaluation Patient Details Name: Sandra Massey MRN: [id number] DOB: 12-29-1950 Today's Date: 06/04/2019   History of Present Illness  Patient is a 69 year old admitted with DKA, severe sepsis due to UTI. PMH: known history of cognitive dysfunction, with type 2 diabetes mellitus, and hypertension as well as osteoarthritis,  Clinical Impression  Patient received in bed, very pleasant, but initially anxious about mobility. Wants shoes to wear her shoes for mobility. She required mod assist for supine to sit and reports right sided pain throughout session. She was able to stand pivot transfer to recliner with min assist. Patient will continue to benefit from skilled PT to progress mobility and ambulation for hopeful return home with sister at discharge.     Follow Up Recommendations SNF    Equipment Recommendations  None recommended by PT    Recommendations for Other Services       Precautions / Restrictions Precautions Precautions: Fall Restrictions Weight Bearing Restrictions: No      Mobility  Bed Mobility Overal bed mobility: Needs Assistance Bed Mobility: Supine to Sit     Supine to sit: Mod assist     General bed mobility comments: assist to scoot forward and to raise trunk to seated position  Transfers Overall transfer level: Needs assistance Equipment used: None Transfers: Stand Pivot Transfers   Stand pivot transfers: Min assist;From elevated surface       General transfer comment: transferred to her left side, she is able to reach to hand rail to assist with pivot  Ambulation/Gait         Gait velocity: decr   General Gait Details: defeered this session  Stairs            Wheelchair Mobility    Modified Rankin (Stroke Patients Only)       Balance Overall balance assessment: Needs assistance Sitting-balance support: Feet supported Sitting balance-Leahy Scale: Good     Standing balance support: Single extremity  supported;During functional activity Standing balance-Leahy Scale: Fair Standing balance comment: reliant on external assist and UE support at this time                             Pertinent Vitals/Pain Pain Assessment: Faces Faces Pain Scale: Hurts little more Pain Location: Right side of body Pain Intervention(s): Monitored during session    [place] expects to be discharged to:: Private residence Living Arrangements: Other relatives Available Help at Discharge: Family Type of Home: House Home Access: Level entry     Home Layout: One level Home Equipment: Kasandra Knudsen - single point Additional Comments: patient lives with sister, whom I spoke with. She ambulates with SPC household distances, generally does her own sponge bathing and dressing.    Prior Function Level of Independence: Independent with assistive device(s)               Hand Dominance        Extremity/Trunk Assessment   Upper Extremity Assessment Upper Extremity Assessment: RUE deficits/detail RUE: Unable to fully assess due to pain RUE Coordination: decreased gross motor;decreased fine motor    Lower Extremity Assessment Lower Extremity Assessment: Generalized weakness;RLE deficits/detail RLE Deficits / Details: R LE appears weaker than left and is painful per her report RLE: Unable to fully assess due to pain    Cervical / Trunk Assessment Cervical / Trunk Assessment: Kyphotic  Communication   Communication: Expressive difficulties  Cognition Arousal/Alertness: Awake/alert Behavior During Therapy: Anxious  Overall Cognitive Status: Within Functional Limits for tasks assessed                                 General Comments: patient is very sweet and appreciative, anxious at times- likes to wear her shoes for mobility. She was able to tell me she lives with her sister and uses cane      General Comments      Exercises     Assessment/Plan    PT  Assessment Patient needs continued PT services  PT Problem List Decreased strength;Decreased activity tolerance;Decreased balance;Pain;Decreased cognition;Decreased mobility;Decreased safety awareness;Obesity       PT Treatment Interventions Therapeutic exercise;Gait training;Balance training;Functional mobility training;Therapeutic activities;Patient/family education    PT Goals (Current goals can be found in the Care Plan section)  Acute Rehab PT Goals Patient Stated Goal: none stated PT Goal Formulation: Patient unable to participate in goal setting Time For Goal Achievement: 06/18/19 Potential to Achieve Goals: Fair    Frequency Min 2X/week   Barriers to discharge        Co-evaluation               AM-PAC PT "6 Clicks" Mobility  Outcome Measure Help needed turning from your back to your side while in a flat bed without using bedrails?: A Lot Help needed moving from lying on your back to sitting on the side of a flat bed without using bedrails?: A Lot Help needed moving to and from a bed to a chair (including a wheelchair)?: A Little Help needed standing up from a chair using your arms (e.g., wheelchair or bedside chair)?: A Little Help needed to walk in hospital room?: A Lot Help needed climbing 3-5 steps with a railing? : Total 6 Click Score: 13    End of Session Equipment Utilized During Treatment: Gait belt Activity Tolerance: Patient tolerated treatment well;Patient limited by fatigue Patient left: in chair;with call bell/phone within reach Nurse Communication: Mobility status;Other (comment)(prior home/mobility information) PT Visit Diagnosis: Other abnormalities of gait and mobility (R26.89);Muscle weakness (generalized) (M62.81);Difficulty in walking, not elsewhere classified (R26.2);Pain;Other symptoms and signs involving the nervous system (R29.898)    Time: 1100-1130 PT Time Calculation (min) (ACUTE ONLY): 30 min   Charges:   PT Evaluation $PT Eval  Moderate Complexity: 1 Mod PT Treatments $Therapeutic Activity: 8-22 mins        Matthias Bogus, PT, GCS 06/04/19,11:55 AM

## 2019-06-04 NOTE — Progress Notes (Addendum)
Inpatient Diabetes Program Recommendations  AACE/ADA: New Consensus Statement on Inpatient Glycemic Control (2015)  Target Ranges:  Prepandial:   less than 140 mg/dL      Peak postprandial:   less than 180 mg/dL (1-2 hours)      Critically ill patients:  140 - 180 mg/dL   Lab Results  Component Value Date   GLUCAP 314 (H) 06/04/2019   HGBA1C 11.4 (H) 05/31/2019    Review of Glycemic Control Results for Sandra Massey, Sandra Massey (MRN [id number]) as of 06/04/2019 15:14  Ref. Range 06/03/2019 11:25 06/03/2019 15:06 06/03/2019 21:11 06/04/2019 07:30 06/04/2019 11:37  Glucose-Capillary Latest Ref Range: 70 - 99 mg/dL 248 (H) 308 (H) 237 (H) 189 (H) 314 (H)  Diabetes history:DM2 Outpatient Diabetes medications:Jardiance 25 mg daily,GlipizideXL5 mg QAM, Metformin 031 mg BID, Trulicity 1.5 mg Qweek Current orders for Inpatient glycemic control:Lantus 24 units daily, Novolog 0-15 units TID with meals, Novolog 0-5 units QHS, Novolog 3 units tid with meals  Inpatient Diabetes Program Recommendations: HgbA1C: A1C 11.4% on 05/31/19 indicating an average glucose of 280 mg/dl over the past 2-3 months.   Patient will need insulin at d/c.  Attempted to discuss with patient however she said "I do not understand and was tearful".  Asked if I could call her sister and she said yes.  Patient tried to communicate with me and ask for something however I was unable to understand.  Alerted RN.  Called patient's sister but unable to get an answer x2. Will need to assure that patient's sister is taught how to administer insulin upon discharge.  Ordered insulin teaching by bedside RN and insulin starter kit.  Also notified social work so that home health is aware as well.  Thanks,  Adah Perl, RN, BC-ADM Inpatient Diabetes Coordinator Pager [phone] (8a-5p)

## 2019-06-04 NOTE — TOC Initial Note (Addendum)
Transition of Care [facility]) - Initial/Assessment Note    Patient Details  Name: Sandra Massey MRN: [id number] Date of Birth: [birth year]  [facility] CM/SW Contact:    Magnus Ivan, LCSW Phone Number: 06/04/2019, 1:28 PM  Clinical Narrative:              PT recommended SNF for patient. CSW spoke with patient's sister/caregiver, [name]. [name] confirmed patient lives with her. [name] reported she and patient would prefer that patient not go to a SNF if possible.  [name] reported they would prefer patient discharge home with home health services. CSW informed [name] of agency options, [name] declined having an agency preference. CSW updated MD and PT. Patient will need PT, OT, and Aide home health services. Asked MD to submit order.  CSW reached out to [place] with [facility] to check [place] availability. Waiting for follow up.  2:10- Spoke with Brittney who reported they can accept patient for PT, OT, and IHA services at discharge. CSW called and informed patient's sister. Encouraged [name] to reach out if she has any questions or decides that she and patient would like to pursue SNF placement instead. CSW will continue to follow.   3:30- CSW informed by Diabetes Coordinator that patient is new to insulin. Informed Brittney with [facility] who reported they can also provide Home Health RN services. CSW will ask MD to update orders.          Patient Goals and CMS Choice        Expected Discharge Plan and Services                                                Prior Living Arrangements/Services                       Activities of Daily Living      Permission Sought/Granted                  Emotional Assessment              Admission diagnosis:  Lactic acidosis [E87.2] DKA (diabetic ketoacidoses) ([place]) [E11.10] Generalized abdominal pain [R10.84] Sepsis associated hypotension ([place]) [A41.9, I95.9] New onset atrial fibrillation (H[facility])  [I48.91] Diabetic ketoacidosis without coma associated with type 2 diabetes mellitus ([place]) [E11.10] Acute kidney injury (AKI) with acute tubular necrosis (ATN) ([place]) [N17.0] Patient Active Problem List   Diagnosis Date Noted  . Weakness   . Sepsis with acute renal failure and septic shock ([place]) 06/01/2019  . Acute kidney injury (AKI) with acute tubular necrosis (ATN) (HCC)   . Lactic acidosis   . Chronic a-fib ([place])   . Kidney stones   . DKA (diabetic ketoacidoses) ([place]) 05/31/2019   PCP:  [name], MD Pharmacy:   [facility] [address] [place] [address] [place]) [place] [address] Phone: [phone] Fax: [phone]  [facility] [address] [place] [address] Phone: [phone]      Social Determinants of Health (SDOH) Interventions    Readmission Risk Interventions No flowsheet data found.

## 2019-06-05 DIAGNOSIS — L8990 Pressure ulcer of unspecified site, unspecified stage: Secondary | ICD-10-CM | POA: Insufficient documentation

## 2019-06-05 LAB — CULTURE, BLOOD (ROUTINE X 2)
Culture: NO GROWTH
Culture: NO GROWTH
Special Requests: ADEQUATE
Special Requests: ADEQUATE

## 2019-06-05 LAB — CBC WITH DIFFERENTIAL/PLATELET
Abs Immature Granulocytes: 0.02 10*3/uL (ref 0.00–0.07)
Basophils Absolute: 0 10*3/uL (ref 0.0–0.1)
Basophils Relative: 1 %
Eosinophils Absolute: 0.2 10*3/uL (ref 0.0–0.5)
Eosinophils Relative: 2 %
HCT: 36.2 % (ref 36.0–46.0)
Hemoglobin: 12.4 g/dL (ref 12.0–15.0)
Immature Granulocytes: 0 %
Lymphocytes Relative: 22 %
Lymphs Abs: 1.9 10*3/uL (ref 0.7–4.0)
MCH: 29.5 pg (ref 26.0–34.0)
MCHC: 34.3 g/dL (ref 30.0–36.0)
MCV: 86 fL (ref 80.0–100.0)
Monocytes Absolute: 0.6 10*3/uL (ref 0.1–1.0)
Monocytes Relative: 6 %
Neutro Abs: 5.9 10*3/uL (ref 1.7–7.7)
Neutrophils Relative %: 69 %
Platelets: 223 10*3/uL (ref 150–400)
RBC: 4.21 MIL/uL (ref 3.87–5.11)
RDW: 13 % (ref 11.5–15.5)
WBC: 8.6 10*3/uL (ref 4.0–10.5)
nRBC: 0 % (ref 0.0–0.2)

## 2019-06-05 LAB — BASIC METABOLIC PANEL
Anion gap: 10 (ref 5–15)
BUN: 35 mg/dL — ABNORMAL HIGH (ref 8–23)
CO2: 26 mmol/L (ref 22–32)
Calcium: 9.4 mg/dL (ref 8.9–10.3)
Chloride: 103 mmol/L (ref 98–111)
Creatinine, Ser: 1.19 mg/dL — ABNORMAL HIGH (ref 0.44–1.00)
GFR calc Af Amer: 55 mL/min — ABNORMAL LOW (ref >60)
GFR calc non Af Amer: 47 mL/min — ABNORMAL LOW (ref >60)
Glucose, Bld: 226 mg/dL — ABNORMAL HIGH (ref 70–99)
Potassium: 4.1 mmol/L (ref 3.5–5.1)
Sodium: 139 mmol/L (ref 135–145)

## 2019-06-05 LAB — GLUCOSE, CAPILLARY
Glucose-Capillary: 183 mg/dL — ABNORMAL HIGH (ref 70–99)
Glucose-Capillary: 245 mg/dL — ABNORMAL HIGH (ref 70–99)
Glucose-Capillary: 236 mg/dL — ABNORMAL HIGH (ref 70–99)
Glucose-Capillary: 308 mg/dL — ABNORMAL HIGH (ref 70–99)

## 2019-06-05 NOTE — Progress Notes (Signed)
Overnight oximetry initiated on rm air. Current O2 sat is 97.

## 2019-06-05 NOTE — Progress Notes (Signed)
Patient ID: Sandra Massey, female   DOB: [birth year], 69 y.o.   MRN: [id number] [facility] PROGRESS NOTE  Sandra Massey PQD:[id number] DOB: [birth year] DOA: 05/31/2019 PCP: [name], MD  HPI/Subjective: Patient feeling better.  Answers a few yes or no questions.  Objective: Vitals:   06/05/19 0800 06/05/19 1011  BP: (!) 142/87 127/72  Pulse: 73 82  Resp: 16 16  Temp: 98.4 F (36.9 C) 98 F (36.7 C)  SpO2: 96% 100%    Intake/Output Summary (Last 24 hours) at 06/05/2019 1226 Last data filed at 06/05/2019 0800 Gross per 24 hour  Intake 2428.51 ml  Output 5325 ml  Net -2896.49 ml   Filed Weights   05/31/19 1356  Weight: 94.8 kg    ROS: Review of Systems  Constitutional: Positive for malaise/fatigue. Negative for fever.  Eyes: Negative for blurred vision.  Respiratory: Negative for shortness of breath.   Cardiovascular: Negative for chest pain.  Gastrointestinal: Positive for diarrhea. Negative for abdominal pain, nausea and vomiting.  Genitourinary: Negative for dysuria.  Musculoskeletal: Negative for joint pain.  Neurological: Negative for headaches.   Exam: Physical Exam  Constitutional: She is oriented to person, place, and time.  HENT:  Nose: No mucosal edema.  Mouth/Throat: No oropharyngeal exudate or posterior oropharyngeal edema.  Eyes: Conjunctivae and lids are normal.  Cardiovascular: S1 normal and S2 normal. Exam reveals no gallop.  No murmur heard. Respiratory: No respiratory distress. She has no wheezes. She has no rhonchi. She has no rales.  GI: Soft. Bowel sounds are normal. There is no abdominal tenderness.  Musculoskeletal:     Right ankle: Swelling present.     Left ankle: Swelling present.  Lymphadenopathy:    She has no cervical adenopathy.  Neurological: She is alert and oriented to person, place, and time. No cranial nerve deficit.  Able to straight leg raise bilaterally  Skin: Skin is warm. No rash noted. Nails show no clubbing.   Psychiatric: She has a normal mood and affect.      Data Reviewed: Basic Metabolic Panel: Recent Labs  Lab 06/01/19 1213 06/02/19 0404 06/03/19 0354 06/04/19 0404 06/05/19 0423  NA 130* 140 140 138 139  K 4.1 3.7 4.2 4.3 4.1  CL 97* 105 109 105 103  CO2 21* _0 GLUCOSE 288* 184* 207* 218* 226*  BUN 75* 73* 48* 39* 35*  CREATININE 3.37* 2.81* 1.73* 1.39* 1.19*  CALCIUM 7.9* 8.5* 8.7* 9.1 9.4   Liver Function Tests: Recent Labs  Lab 05/31/19 1407  AST 18  ALT 29  ALKPHOS 98  BILITOT 1.1  PROT 8.1  ALBUMIN 4.2   Recent Labs  Lab 05/31/19 1407  LIPASE 111*   CBC: Recent Labs  Lab 06/01/19 0446 06/02/19 0404 06/03/19 0354 06/04/19 0404 06/05/19 0418  WBC 16.1* 13.2* 10.2 9.2 8.6  NEUTROABS 12.8* 10.3* 7.1 6.2 5.9  HGB 14.1 14.2 12.9 12.9 12.4  HCT 42.1 43.1 39.4 38.7 36.2  MCV 87.2 86.5 87.6 87.0 86.0  PLT 312 318 236 241 223    CBG: Recent Labs  Lab 06/04/19 1608 06/04/19 2208 06/04/19 2357 06/05/19 0755 06/05/19 1154  GLUCAP 311* 239* 242* 183* 245*    Recent Results (from the past 240 hour(s))  Urine Culture     Status: None   Collection Time: 05/31/19  2:49 PM   Specimen: Urine, Random  Result Value Ref Range Status   Specimen Description   Final    URINE, RANDOM Performed at  [facility], [place] [address], [place], [place] [address]    Special Requests   Final    NONE Performed at [facility], [address] [place], [place] [address]    Culture   Final    NO GROWTH Performed at [facility], [place] [address] [place], [place] [address]    Report Status 06/01/2019 FINAL  Final  Blood culture (routine x 2)     Status: None   Collection Time: 05/31/19  3:33 PM   Specimen: BLOOD  Result Value Ref Range Status   Specimen Description BLOOD BLOOD RIGHT FOREARM  Final   Special Requests   Final    BOTTLES DRAWN AEROBIC AND ANAEROBIC Blood Culture adequate volume   Culture   Final    NO  GROWTH 5 DAYS Performed at [facility], [address], [place], [place] [address]    Report Status 06/05/2019 FINAL  Final  Blood culture (routine x 2)     Status: None   Collection Time: 05/31/19  3:38 PM   Specimen: BLOOD  Result Value Ref Range Status   Specimen Description BLOOD BLOOD RIGHT ARM  Final   Special Requests   Final    BOTTLES DRAWN AEROBIC AND ANAEROBIC Blood Culture adequate volume   Culture   Final    NO GROWTH 5 DAYS Performed at [facility], [address] [place], [place] [address]    Report Status 06/05/2019 FINAL  Final  Respiratory Panel by RT PCR (Flu A&B, Covid) - Nasopharyngeal Swab     Status: None   Collection Time: 05/31/19  3:55 PM   Specimen: Nasopharyngeal Swab  Result Value Ref Range Status   SARS Coronavirus 2 by RT PCR NEGATIVE NEGATIVE Final    Comment: (NOTE) SARS-CoV-2 target nucleic acids are NOT DETECTED. The SARS-CoV-2 RNA is generally detectable in upper respiratoy specimens during the acute phase of infection. The lowest concentration of SARS-CoV-2 viral copies this assay can detect is 131 copies/mL. A negative result does not preclude SARS-Cov-2 infection and should not be used as the sole basis for treatment or other patient management decisions. A negative result may occur with  improper specimen collection/handling, submission of specimen other than nasopharyngeal swab, presence of viral mutation(s) within the areas targeted by this assay, and inadequate number of viral copies (<131 copies/mL). A negative result must be combined with clinical observations, patient history, and epidemiological information. The expected result is Negative. Fact Sheet for Patients:  PinkCheek.be Fact Sheet for Healthcare Providers:  GravelBags.it This test is not yet ap proved or cleared by the [place] FDA and  has been authorized for detection and/or diagnosis  of SARS-CoV-2 by FDA under an Emergency Use Authorization (EUA). This EUA will remain  in effect (meaning this test can be used) for the duration of the COVID-19 declaration under Section 564(b)(1) of the Act, 21 U.S.C. section 360bbb-3(b)(1), unless the authorization is terminated or revoked sooner.    Influenza A by PCR NEGATIVE NEGATIVE Final   Influenza B by PCR NEGATIVE NEGATIVE Final    Comment: (NOTE) The Xpert Xpress SARS-CoV-2/FLU/RSV assay is intended as an aid in  the diagnosis of influenza from Nasopharyngeal swab specimens and  should not be used as a sole basis for treatment. Nasal washings and  aspirates are unacceptable for Xpert Xpress SARS-CoV-2/FLU/RSV  testing. Fact Sheet for Patients: PinkCheek.be Fact Sheet for Healthcare Providers: GravelBags.it This test is not yet approved or cleared by the  [place] Architectural technologist and  has been authorized for detection and/or diagnosis of SARS-CoV-2 by  FDA under an Print production planner (EUA). This EUA will remain  in effect (meaning this test can be used) for the duration of the  Covid-19 declaration under Section 564(b)(1) of the Act, 21  U.S.C. section 360bbb-3(b)(1), unless the authorization is  terminated or revoked. Performed at [facility], [address] [place], [place] [address]   MRSA PCR Screening     Status: None   Collection Time: 06/01/19 10:33 PM   Specimen: Nasopharyngeal  Result Value Ref Range Status   MRSA by PCR NEGATIVE NEGATIVE Final    Comment:        The GeneXpert MRSA Assay (FDA approved for NASAL specimens only), is one component of a comprehensive MRSA colonization surveillance program. It is not intended to diagnose MRSA infection nor to guide or monitor treatment for MRSA infections. Performed at [facility], [address] [place],  [address]       Scheduled Meds: . amiodarone  200 mg Oral  Daily  . apixaban  5 mg Oral BID  . calcium carbonate  500 mg Oral Daily  . Chlorhexidine Gluconate Cloth  6 each Topical Daily  . cholecalciferol  1,000 Units Oral Daily  . insulin aspart  0-15 Units Subcutaneous TID WC  . insulin aspart  0-5 Units Subcutaneous QHS  . insulin aspart  3 Units Subcutaneous TID WC  . insulin glargine  24 Units Subcutaneous Daily  . insulin starter kit- pen needles  1 kit Other Once   Continuous Infusions: . sodium chloride Stopped (06/01/19 1600)    Assessment/Plan:  1. Septic shock with acute kidney injury.  Discontinue IV fluids.  Creatinine improved to 1.19.Marland Kitchen  Patient required initial pressors but is off pressors at this point.  Cultures negative.  Patient received antibiotics for a few days then discontinued 2. Diabetic ketoacidosis on presentation.  Now over to glargine insulin and sliding scale. 3. Acute kidney injury.  Improved with IV fluids.  Creatinine down to 1.19. 4. Chronic atrial fibrillation.  Sotalol was contraindicated secondary to acute kidney injury and I changed the patient over to amiodarone.  She has done well with the amiodarone.  Patient also on Eliquis. 5. Lactic acidosis secondary to sepsis 6. Weakness.  Physical therapy recommending rehab.  Patient and sister would like to take the patient home with home health.  Would like to see the patient walk again with physical therapy today  Code Status:     Code Status Orders  (From admission, onward)         Start     Ordered   05/31/19 1907  Full code  Continuous     05/31/19 1908        Code Status History    This patient has a current code status but no historical code status.   Advance Care Planning Activity     Family Communication: Spoke with the patient's sister on the phone Disposition Plan: Physical therapy recommended rehab.  Patient and sister would like to take the patient home with home health.  Would like to see the patient walk today with physical therapy.   If the patient does better today with physical therapy I will send home tomorrow morning.  Time spent: 27 minutes  [place]

## 2019-06-06 LAB — CBC WITH DIFFERENTIAL/PLATELET
Abs Immature Granulocytes: 0.03 10*3/uL (ref 0.00–0.07)
Basophils Absolute: 0 10*3/uL (ref 0.0–0.1)
Basophils Relative: 1 %
Eosinophils Absolute: 0.2 10*3/uL (ref 0.0–0.5)
Eosinophils Relative: 3 %
HCT: 38.1 % (ref 36.0–46.0)
Hemoglobin: 12.5 g/dL (ref 12.0–15.0)
Immature Granulocytes: 0 %
Lymphocytes Relative: 24 %
Lymphs Abs: 2 10*3/uL (ref 0.7–4.0)
MCH: 28.9 pg (ref 26.0–34.0)
MCHC: 32.8 g/dL (ref 30.0–36.0)
MCV: 88.2 fL (ref 80.0–100.0)
Monocytes Absolute: 0.7 10*3/uL (ref 0.1–1.0)
Monocytes Relative: 8 %
Neutro Abs: 5.5 10*3/uL (ref 1.7–7.7)
Neutrophils Relative %: 64 %
Platelets: 256 10*3/uL (ref 150–400)
RBC: 4.32 MIL/uL (ref 3.87–5.11)
RDW: 13 % (ref 11.5–15.5)
WBC: 8.4 10*3/uL (ref 4.0–10.5)
nRBC: 0 % (ref 0.0–0.2)

## 2019-06-06 LAB — GLUCOSE, CAPILLARY
Glucose-Capillary: 261 mg/dL — ABNORMAL HIGH (ref 70–99)
Glucose-Capillary: 346 mg/dL — ABNORMAL HIGH (ref 70–99)
Glucose-Capillary: 279 mg/dL — ABNORMAL HIGH (ref 70–99)
Glucose-Capillary: 268 mg/dL — ABNORMAL HIGH (ref 70–99)

## 2019-06-06 MED ORDER — AMIODARONE HCL 200 MG PO TABS
200.0000 mg | ORAL_TABLET | Freq: Two times a day (BID) | ORAL | Status: DC
  Administered 2019-06-06 – 2019-06-07 (×2): 200 mg via ORAL
  Filled 2019-06-06 (×2): qty 1

## 2019-06-06 MED ORDER — INSULIN ASPART 100 UNIT/ML ~~LOC~~ SOLN
6.0000 [IU] | Freq: Three times a day (TID) | SUBCUTANEOUS | Status: DC
  Administered 2019-06-06 – 2019-06-07 (×3): 6 [IU] via SUBCUTANEOUS
  Filled 2019-06-06 (×3): qty 1

## 2019-06-06 MED ORDER — INSULIN STARTER KIT- PEN NEEDLES (ENGLISH): 1.0000 | Freq: Once | 0 refills | Status: AC

## 2019-06-06 MED ORDER — INSULIN ASPART 100 UNIT/ML FLEXPEN: 3.0000 [IU] | PEN_INJECTOR | Freq: Three times a day (TID) | SUBCUTANEOUS | 0 refills | Status: DC

## 2019-06-06 MED ORDER — AMIODARONE HCL 200 MG PO TABS: 200.0000 mg | ORAL_TABLET | Freq: Two times a day (BID) | ORAL | 0 refills | Status: AC

## 2019-06-06 MED ORDER — INSULIN GLARGINE 100 UNIT/ML ~~LOC~~ SOLN
28.0000 [IU] | Freq: Every day | SUBCUTANEOUS | Status: DC
  Administered 2019-06-07: 28 [IU] via SUBCUTANEOUS
  Filled 2019-06-06 (×2): qty 0.28

## 2019-06-06 MED ORDER — INSULIN GLARGINE 100 UNIT/ML SOLOSTAR PEN: 24.0000 [IU] | PEN_INJECTOR | Freq: Every day | SUBCUTANEOUS | 0 refills | Status: DC

## 2019-06-06 NOTE — Discharge Instructions (Signed)
Recommend sleep study as outpatient.  Desaturates at night and qualifies for nighttime oxygen   Acute Kidney Injury, Adult  Acute kidney injury is a sudden worsening of kidney function. The kidneys are organs that have several jobs. They filter the blood to remove waste products and extra fluid. They also maintain a healthy balance of minerals and hormones in the body, which helps control blood pressure and keep bones strong. With this condition, your kidneys do not do their jobs as well as they should. This condition ranges from mild to severe. Over time it may develop into long-lasting (chronic) kidney disease. Early detection and treatment may prevent acute kidney injury from developing into a chronic condition. What are the causes? Common causes of this condition include:  A problem with blood flow to the kidneys. This may be caused by: ? Low blood pressure (hypotension) or shock. ? Blood loss. ? Heart and blood vessel (cardiovascular) disease. ? Severe burns. ? Liver disease.  Direct damage to the kidneys. This may be caused by: ? Certain medicines. ? A kidney infection. ? Poisoning. ? Being around or in contact with toxic substances. ? A surgical wound. ? A hard, direct hit to the kidney area.  A sudden blockage of urine flow. This may be caused by: ? Cancer. ? Kidney stones. ? An enlarged prostate in males. What are the signs or symptoms? Symptoms of this condition may not be obvious until the condition becomes severe. Symptoms of this condition can include:  Tiredness (lethargy), or difficulty staying awake.  Nausea or vomiting.  Swelling (edema) of the face, legs, ankles, or feet.  Problems with urination, such as: ? Abdominal pain, or pain along the side of your stomach (flank). ? Decreased urine production. ? Decrease in the force of urine flow.  Muscle twitches and cramps, especially in the legs.  Confusion or trouble concentrating.  Loss of  appetite.  Fever. How is this diagnosed? This condition may be diagnosed with tests, including:  Blood tests.  Urine tests.  Imaging tests.  A test in which a sample of tissue is removed from the kidneys to be examined under a microscope (kidney biopsy). How is this treated? Treatment for this condition depends on the cause and how severe the condition is. In mild cases, treatment may not be needed. The kidneys may heal on their own. In more severe cases, treatment will involve:  Treating the cause of the kidney injury. This may involve changing any medicines you are taking or adjusting your dosage.  Fluids. You may need specialized IV fluids to balance your body's needs.  Having a catheter placed to drain urine and prevent blockages.  Preventing problems from occurring. This may mean avoiding certain medicines or procedures that can cause further injury to the kidneys. In some cases treatment may also require:  A procedure to remove toxic wastes from the body (dialysis or continuous renal replacement therapy - CRRT).  Surgery. This may be done to repair a torn kidney, or to remove the blockage from the urinary system. Follow these instructions at home: Medicines  Take over-the-counter and prescription medicines only as told by your health care provider.  Do not take any new medicines without your health care provider's approval. Many medicines can worsen your kidney damage.  Do not take any vitamin and mineral supplements without your health care provider's approval. Many nutritional supplements can worsen your kidney damage. Lifestyle  If your health care provider prescribed changes to your diet, follow them. You  may need to decrease the amount of protein you eat.  Achieve and maintain a healthy weight. If you need help with this, ask your health care provider.  Start or continue an exercise plan. Try to exercise at least 30 minutes a day, 5 days a week.  Do not use any  tobacco products, such as cigarettes, chewing tobacco, and e-cigarettes. If you need help quitting, ask your health care provider. General instructions  Keep track of your blood pressure. Report changes in your blood pressure as told by your health care provider.  Stay up to date with immunizations. Ask your health care provider which immunizations you need.  Keep all follow-up visits as told by your health care provider. This is important. Where to find more information  American Association of Kidney Patients: BombTimer.gl  National Kidney Foundation: www.kidney.[place]: https://mathis.com/  Life Options Rehabilitation Program: ? www.lifeoptions.org ? www.kidneyschool.org Contact a health care provider if:  Your symptoms get worse.  You develop new symptoms. Get help right away if:  You develop symptoms of worsening kidney disease, which include: ? Headaches. ? Abnormally dark or light skin. ? Easy bruising. ? Frequent hiccups. ? Chest pain. ? Shortness of breath. ? End of menstruation in women. ? Seizures. ? Confusion or altered mental status. ? Abdominal or back pain. ? Itchiness.  You have a fever.  Your body is producing less urine.  You have pain or bleeding when you urinate. Summary  Acute kidney injury is a sudden worsening of kidney function.  Acute kidney injury can be caused by problems with blood flow to the kidneys, direct damage to the kidneys, and sudden blockage of urine flow.  Symptoms of this condition may not be obvious until it becomes severe. Symptoms may include edema, lethargy, confusion, nausea or vomiting, and problems passing urine.  This condition can usually be diagnosed with blood tests, urine tests, and imaging tests. Sometimes a kidney biopsy is done to diagnose this condition.  Treatment for this condition often involves treating the underlying cause. It is treated with fluids, medicines, dialysis, diet changes, or  surgery. This information is not intended to replace advice given to you by your health care provider. Make sure you discuss any questions you have with your health care provider. Document Revised: 02/07/2017 Document Reviewed: 02/16/2016 Elsevier Patient Education  [address].   Atrial Fibrillation  Atrial fibrillation is a type of irregular or rapid heartbeat (arrhythmia). In atrial fibrillation, the top part of the heart (atria) beats in an irregular pattern. This makes the heart unable to pump blood normally and effectively. The goal of treatment is to prevent blood clots from forming, control your heart rate, or restore your heartbeat to a normal rhythm. If this condition is not treated, it can cause serious problems, such as a weakened heart muscle (cardiomyopathy) or a stroke. What are the causes? This condition is often caused by medical conditions that damage the heart's electrical system. These include:  High blood pressure (hypertension). This is the most common cause.  Certain heart problems or conditions, such as heart failure, coronary artery disease, heart valve problems, or heart surgery.  Diabetes.  Overactive thyroid (hyperthyroidism).  Obesity.  Chronic kidney disease. In some cases, the cause of this condition is not known. What increases the risk? This condition is more likely to develop in:  Older people.  People who smoke.  Athletes who do endurance exercise.  People who have a family history of atrial fibrillation.  Men.  People who use drugs.  People who drink a lot of alcohol.  People who have lung conditions, such as emphysema, pneumonia, or COPD.  People who have obstructive sleep apnea. What are the signs or symptoms? Symptoms of this condition include:  A feeling that your heart is racing or beating irregularly.  Discomfort or pain in your chest.  Shortness of breath.  Sudden light-headedness or weakness.  Tiring easily  during exercise or activity.  Fatigue.  Syncope (fainting).  Sweating. In some cases, there are no symptoms. How is this diagnosed? Your health care provider may detect atrial fibrillation when taking your pulse. If detected, this condition may be diagnosed with:  An electrocardiogram (ECG) to check electrical signals of the heart.  An ambulatory cardiac monitor to record your heart's activity for a few days.  A transthoracic echocardiogram (TTE) to create pictures of your heart.  A transesophageal echocardiogram (TEE) to create even closer pictures of your heart.  A stress test to check your blood supply while you exercise.  Imaging tests, such as a CT scan or chest X-ray.  Blood tests. How is this treated? Treatment depends on underlying conditions and how you feel when you experience atrial fibrillation. This condition may be treated with:  Medicines to prevent blood clots or to treat heart rate or heart rhythm problems.  Electrical cardioversion to reset the heart's rhythm.  A pacemaker to correct abnormal heart rhythm.  Ablation to remove the heart tissue that sends abnormal signals.  Left atrial appendage closure to seal the area where blood clots can form. In some cases, underlying conditions will be treated. Follow these instructions at home: Medicines  Take over-the counter and prescription medicines only as told by your health care provider.  Do not take any new medicines without talking to your health care provider.  If you are taking blood thinners: ? Talk with your health care provider before you take any medicines that contain aspirin or NSAIDs, such as ibuprofen. These medicines increase your risk for dangerous bleeding. ? Take your medicine exactly as told, at the same time every day. ? Avoid activities that could cause injury or bruising, and follow instructions about how to prevent falls. ? Wear a medical alert bracelet or carry a card that lists what  medicines you take. Lifestyle      Do not use any products that contain nicotine or tobacco, such as cigarettes, e-cigarettes, and chewing tobacco. If you need help quitting, ask your health care provider.  Eat heart-healthy foods. Talk with a dietitian to make an eating plan that is right for you.  Exercise regularly as told by your health care provider.  Do not drink alcohol.  Lose weight if you are overweight.  Do not use drugs, including cannabis. General instructions  If you have obstructive sleep apnea, manage your condition as told by your health care provider.  Do not use diet pills unless your health care provider approves. Diet pills can make heart problems worse.  Keep all follow-up visits as told by your health care provider. This is important. Contact a health care provider if you:  Notice a change in the rate, rhythm, or strength of your heartbeat.  Are taking a blood thinner and you notice more bruising.  Tire more easily when you exercise or do heavy work.  Have a sudden change in weight. Get help right away if you have:   Chest pain, abdominal pain, sweating, or weakness.  Trouble breathing.  Side  effects of blood thinners, such as blood in your vomit, stool, or urine, or bleeding that cannot stop.  Any symptoms of a stroke. "BE FAST" is an easy way to remember the main warning signs of a stroke: ? B - Balance. Signs are dizziness, sudden trouble walking, or loss of balance. ? E - Eyes. Signs are trouble seeing or a sudden change in vision. ? F - Face. Signs are sudden weakness or numbness of the face, or the face or eyelid drooping on one side. ? A - Arms. Signs are weakness or numbness in an arm. This happens suddenly and usually on one side of the body. ? S - Speech. Signs are sudden trouble speaking, slurred speech, or trouble understanding what people say. ? T - Time. Time to call emergency services. Write down what time symptoms started.  Other  signs of a stroke, such as: ? A sudden, severe headache with no known cause. ? Nausea or vomiting. ? Seizure. These symptoms may represent a serious problem that is an emergency. Do not wait to see if the symptoms will go away. Get medical help right away. Call your local emergency services (911 in the [place] Do not drive yourself to the hospital. Summary  Atrial fibrillation is a type of irregular or rapid heartbeat (arrhythmia).  Symptoms include a feeling that your heart is beating fast or irregularly.  You may be given medicines to prevent blood clots or to treat heart rate or heart rhythm problems.  Get help right away if you have signs or symptoms of a stroke.  Get help right away if you cannot catch your breath or have chest pain or pressure. This information is not intended to replace advice given to you by your health care provider. Make sure you discuss any questions you have with your health care provider. Document Revised: 08/19/2018 Document Reviewed: 08/19/2018 Elsevier Patient Education  [place].
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
## 2019-06-06 NOTE — Progress Notes (Signed)
Patient family at bedside, and requesting to know the status of discharge.  MD paged by this RN to alert him of patient inability to ambulate or alert RN to incontinence.  Patient Sister has caregiver in home 3x/week to care for both of them, however patient's family states "we were under the impression she could get up and walk to the bathroom, as well as walk from car" Physical therapy has evaluated patient and recommends SNF rehab. This RN alerted MD to patient condition/family concerns and will revisit discharge plan after PT sees patient.  This RN paged on call Physical Therapist to get an ETA, waiting on return communication. Will continue to monitor.

## 2019-06-07 LAB — CBC WITH DIFFERENTIAL/PLATELET
Abs Immature Granulocytes: 0.05 10*3/uL (ref 0.00–0.07)
Basophils Absolute: 0.1 10*3/uL (ref 0.0–0.1)
Basophils Relative: 1 %
Eosinophils Absolute: 0.2 10*3/uL (ref 0.0–0.5)
Eosinophils Relative: 3 %
HCT: 38.5 % (ref 36.0–46.0)
Hemoglobin: 12.5 g/dL (ref 12.0–15.0)
Immature Granulocytes: 1 %
Lymphocytes Relative: 23 %
Lymphs Abs: 2 10*3/uL (ref 0.7–4.0)
MCH: 28.7 pg (ref 26.0–34.0)
MCHC: 32.5 g/dL (ref 30.0–36.0)
MCV: 88.5 fL (ref 80.0–100.0)
Monocytes Absolute: 0.7 10*3/uL (ref 0.1–1.0)
Monocytes Relative: 8 %
Neutro Abs: 6 10*3/uL (ref 1.7–7.7)
Neutrophils Relative %: 64 %
Platelets: 250 10*3/uL (ref 150–400)
RBC: 4.35 MIL/uL (ref 3.87–5.11)
RDW: 12.8 % (ref 11.5–15.5)
WBC: 9.1 10*3/uL (ref 4.0–10.5)
nRBC: 0 % (ref 0.0–0.2)

## 2019-06-07 LAB — GLUCOSE, CAPILLARY
Glucose-Capillary: 251 mg/dL — ABNORMAL HIGH (ref 70–99)
Glucose-Capillary: 320 mg/dL — ABNORMAL HIGH (ref 70–99)

## 2019-06-07 MED ORDER — INSULIN GLARGINE 100 UNIT/ML SOLOSTAR PEN: 32.0000 [IU] | PEN_INJECTOR | Freq: Every day | SUBCUTANEOUS | 0 refills | Status: DC

## 2019-06-07 MED ORDER — INSULIN ASPART 100 UNIT/ML FLEXPEN: 7.0000 [IU] | PEN_INJECTOR | Freq: Three times a day (TID) | SUBCUTANEOUS | 0 refills | Status: DC

## 2019-06-07 MED ORDER — INSULIN PEN NEEDLE 31G X 8 MM MISC: 0 refills | Status: AC

## 2019-06-07 NOTE — TOC Progression Note (Addendum)
Transition of Care [facility]) - Progression Note    Patient Details  Name: Sandra Massey MRN: [id number] Date of Birth: Jan 22, 1951  Transition of Care [facility]) CM/SW [place], LCSW Phone Number: 06/07/2019, 1:43 PM  Clinical Narrative: [place] representative is aware of pm oxygen need.  2:20 pm: Notified [facility] representative that patient will discharge home today as long as she does well with PT.    Expected Discharge Plan and Services           Expected Discharge Date: 06/06/19                                     Social Determinants of Health (SDOH) Interventions    Readmission Risk Interventions No flowsheet data found.

## 2019-06-07 NOTE — Progress Notes (Signed)
Inpatient Diabetes Program Recommendations  AACE/ADA: New Consensus Statement on Inpatient Glycemic Control (2015)  Target Ranges:  Prepandial:   less than 140 mg/dL      Peak postprandial:   less than 180 mg/dL (1-2 hours)      Critically ill patients:  140 - 180 mg/dL   Lab Results  Component Value Date   GLUCAP 320 (H) 06/07/2019   HGBA1C 11.4 (H) 05/31/2019    Review of Glycemic Control Results for Sandra Massey, Sandra Massey (MRN [id number]) as of 06/07/2019 14:39  Ref. Range 06/06/2019 11:46 06/06/2019 16:57 06/06/2019 21:24 06/07/2019 08:03 06/07/2019 11:58  Glucose-Capillary Latest Ref Range: 70 - 99 mg/dL 346 (H) 279 (H) 268 (H) 251 (H) 320 (H)   Diabetes history: DM 2 Outpatient Diabetes medications:Jardiance 25 mg daily,GlipizideXL5 mg QAM, Metformin 681 mg BID, Trulicity 1.5 mg Qweek Current orders for Inpatient glycemic control:Lantus28units daily, Novolog 0-15units TID with meals, Novolog 0-5 units QHS, Novolog 6 units tid with meals  Inpatient Diabetes Program Recommendations:  Spoke with patient's caregiver regarding patient going home on insulin. Myra states that she stays with patient and her sister Mondays, Wednesdays, and Fridays.  Myra is very familiar with insulin as she has diabetes her self and self-administers insulin.  She states that Mrs. Blomberg was independent giving her "trulicity" injections, but that Myra would help her prepare them.  I explained that patient would be prescribed and insulin pen and she states "that's even better".  Explained how patient got upset on Friday when I tried to discuss DM and insulin with her and she states that patient does this when she gets nervous.  She feels that patient will do better once she is home and in a familiar environment.  Reviewed insulins with Myra and ordered doses for home.  Also emailed Insulin pen video for patient to watch once she is home.  Also explained that other DM medications had been discontinued and importance of f/u  regarding DM with PCP.  We also reviewed hypoglycemia signs, symptoms and treatment.  Note plans for d/c if patient does well with PT.   Thanks,  Adah Perl, RN, BC-ADM Inpatient Diabetes Coordinator Pager [phone] ([phone])

## 2019-06-07 NOTE — Discharge Summary (Signed)
[facility] [place] NAME: Sandra Massey    MR#:  [id number]  DATE OF BIRTH:  March 16, 1950  DATE OF ADMISSION:  05/31/2019 ADMITTING PHYSICIAN: Loletha Grayer, MD  DATE OF DISCHARGE: 06/07/2019  PRIMARY CARE PHYSICIAN: [name], MD    ADMISSION DIAGNOSIS:  Lactic acidosis [E87.2] DKA (diabetic ketoacidoses) ([place]) [E11.10] Generalized abdominal pain [R10.84] Sepsis associated hypotension ([place]) [A41.9, I95.9] New onset atrial fibrillation (HCC) [I48.91] Diabetic ketoacidosis without coma associated with type 2 diabetes mellitus ([place]) [E11.10] Acute kidney injury (AKI) with acute tubular necrosis (ATN) (HCC) [N17.0]  DISCHARGE DIAGNOSIS:  Active Problems:   DKA (diabetic ketoacidoses) ([place])   Sepsis with acute renal failure and septic shock (HCC)   Weakness   Pressure injury of skin   SECONDARY DIAGNOSIS:   Past Medical History:  Diagnosis Date  . Arthritis   . Diabetes mellitus without complication ([place])   . Hypertension   . Substance abuse ([place])     HOSPITAL COURSE:   1.  Septic shock with acute kidney injury.  Patient required pressors initially in stepdown ICU admission.  All cultures were negative.  The patient received antibiotics for a few days and then they were discontinued. 2.  Diabetic ketoacidosis on presentation.  Hemoglobin A1c elevated at 11.4.  I increased Lantus insulin to 32 units daily. Short acting insulin increased to 7 units prior to meals.  I called her pharmacy and mention that they can substitute any short acting insulin prior to meals.  Pen needles also E prescribed. 3.  Acute kidney injury.  This has improved with IV fluid hydration.  Creatinine was elevated at 4.01 on presentation and came down to 1.19 as of 3/27.  Recommend following up as outpatient. 4.  Chronic atrial fibrillation.  With her acute kidney injury and decreased GFR sotalol is contraindicated.  I switch the patient over to amiodarone.   She has done well with the amiodarone while here.  Patient also on Eliquis for anticoagulation. 5.  Lactic acidosis secondary to sepsis could also be secondary to Metformin.  At this point will continue to hold Metformin. 6.  Weakness.  Physical therapy recommended rehab on Friday.  They were unable to evaluate the patient over the weekend.  Today the patient was able to walk with nursing staff.  Awaiting physical therapy evaluation today.  If she does well with physical therapy she will be discharged home with home health. 7.  Nocturnal hypoxia.  The patient qualifies for nighttime oxygen which was set up.  The patient will be on 2 L of oxygen at night.  The patient did have plenty of desaturations suggestive of sleep apnea.  Recommend an outpatient sleep study.  DISCHARGE CONDITIONS:   Satisfactory  CONSULTS OBTAINED:  Critical care specialist initially  DRUG ALLERGIES:   Allergies  Allergen Reactions  . Latex Rash    DISCHARGE MEDICATIONS:   Allergies as of 06/07/2019      Reactions   Latex Rash      Medication List    STOP taking these medications   aspirin 325 MG tablet   carisoprodol 350 MG tablet Commonly known as: SOMA   glipiZIDE 5 MG tablet Commonly known as: GLUCOTROL   lisinopril-hydrochlorothiazide 20-25 MG tablet Commonly known as: ZESTORETIC   metFORMIN 500 MG tablet Commonly known as: GLUCOPHAGE   simvastatin 40 MG tablet Commonly known as: ZOCOR   SOTALOL AF 80 MG Tabs   spironolactone 25 MG tablet Commonly known as: ALDACTONE  Trulicity 1.5 LT/9.0ZE Sopn Generic drug: Dulaglutide     TAKE these medications   amiodarone 200 MG tablet Commonly known as: PACERONE Take 1 tablet (200 mg total) by mouth 2 (two) times daily.   calcium carbonate 600 MG Tabs tablet Commonly known as: OS-CAL Take 600 mg by mouth daily.   cholecalciferol 1000 units tablet Commonly known as: VITAMIN D Take 1,000 Units by mouth daily.   Eliquis 5 MG Tabs  tablet Generic drug: apixaban Take 5 mg by mouth 2 (two) times daily.   insulin aspart 100 UNIT/ML FlexPen Commonly known as: NOVOLOG Inject 7 Units into the skin 3 (three) times daily with meals.   insulin glargine 100 UNIT/ML Solostar Pen Commonly known as: LANTUS Inject 32 Units into the skin daily.   Insulin Pen Needle 31G X 8 MM Misc 1 needle with each injection of insulin   rosuvastatin 40 MG tablet Commonly known as: CRESTOR Take 40 mg by mouth daily.   sertraline 50 MG tablet Commonly known as: ZOLOFT Take 50 mg by mouth daily.     ASK your doctor about these medications   insulin starter kit- pen needles Misc 1 kit by Other route once for 1 dose. Ask about: Should I take this medication?            Durable Medical Equipment  (From admission, onward)         Start     Ordered   06/06/19 0844  For home use only DME oxygen  Once    Question Answer Comment  Length of Need Lifetime   Mode or (Route) Nasal cannula   Liters per Minute 2   Frequency Only at night (stationary unit needed)   Oxygen conserving device Yes   Oxygen delivery system Gas      06/06/19 0844           DISCHARGE INSTRUCTIONS:   Follow-up PMD 5 days  If you experience worsening of your admission symptoms, develop shortness of breath, life threatening emergency, suicidal or homicidal thoughts you must seek medical attention immediately by calling 911 or calling your MD immediately  if symptoms less severe.  You Must read complete instructions/literature along with all the possible adverse reactions/side effects for all the Medicines you take and that have been prescribed to you. Take any new Medicines after you have completely understood and accept all the possible adverse reactions/side effects.   Please note  You were cared for by a hospitalist during your hospital stay. If you have any questions about your discharge medications or the care you received while you were in the  hospital after you are discharged, you can call the unit and asked to speak with the hospitalist on call if the hospitalist that took care of you is not available. Once you are discharged, your primary care physician will handle any further medical issues. Please note that NO REFILLS for any discharge medications will be authorized once you are discharged, as it is imperative that you return to your primary care physician (or establish a relationship with a primary care physician if you do not have one) for your aftercare needs so that they can reassess your need for medications and monitor your lab values.    Today   CHIEF COMPLAINT:   Chief Complaint  Patient presents with  . Weakness  . Nausea  . Emesis    HISTORY OF PRESENT ILLNESS:  Sandra Massey  is a 69 y.o. female presented with weakness, nausea  vomiting   VITAL SIGNS:  Blood pressure 133/81, pulse 87, temperature 97.8 F (36.6 C), temperature source Oral, resp. rate 17, height '5\' 6"'  (1.676 m), weight 94.8 kg, SpO2 94 %.   PHYSICAL EXAMINATION:  GENERAL:  69 y.o.-year-old patient lying in the bed with no acute distress.  EYES: Pupils equal, round, reactive to light and accommodation. No scleral icterus. NECK:  Supple, no jugular venous distention. No thyroid enlargement, no tenderness.  LUNGS: Normal breath sounds bilaterally, no wheezing, rales,rhonchi or crepitation. No use of accessory muscles of respiration.  CARDIOVASCULAR: S1, S2 normal. No murmurs, rubs, or gallops.  ABDOMEN: Soft, non-tender, non-distended. EXTREMITIES: Trace pedal edema.no cyanosis, or clubbing.  NEUROLOGIC: Cranial nerves II through XII are intact. Muscle strength 5/5 in all extremities.  Patient able to straight leg raise bilaterally PSYCHIATRIC: The patient is alert and answers some yes or no questions.  SKIN: No obvious rash, lesion, or ulcer.   DATA REVIEW:   CBC Recent Labs  Lab 06/07/19 0426  WBC 9.1  HGB 12.5  HCT 38.5  PLT 250     Chemistries  Recent Labs  Lab 06/05/19 0423  NA 139  K 4.1  CL 103  CO2 26  GLUCOSE 226*  BUN 35*  CREATININE 1.19*  CALCIUM 9.4    Microbiology Results  Results for orders placed or performed during the hospital encounter of 05/31/19  Urine Culture     Status: None   Collection Time: 05/31/19  2:49 PM   Specimen: Urine, Random  Result Value Ref Range Status   Specimen Description   Final    URINE, RANDOM Performed at [facility] [address], [place], [place] [address]    Special Requests   Final    NONE Performed at [facility], [address] [place], [place] [address]    Culture   Final    NO GROWTH Performed at [facility], [place] [address] [place], [place] [address]    Report Status 06/01/2019 FINAL  Final  Blood culture (routine x 2)     Status: None   Collection Time: 05/31/19  3:33 PM   Specimen: BLOOD  Result Value Ref Range Status   Specimen Description BLOOD BLOOD RIGHT FOREARM  Final   Special Requests   Final    BOTTLES DRAWN AEROBIC AND ANAEROBIC Blood Culture adequate volume   Culture   Final    NO GROWTH 5 DAYS Performed at [facility], [address], [place], [place] [address]    Report Status 06/05/2019 FINAL  Final  Blood culture (routine x 2)     Status: None   Collection Time: 05/31/19  3:38 PM   Specimen: BLOOD  Result Value Ref Range Status   Specimen Description BLOOD BLOOD RIGHT ARM  Final   Special Requests   Final    BOTTLES DRAWN AEROBIC AND ANAEROBIC Blood Culture adequate volume   Culture   Final    NO GROWTH 5 DAYS Performed at [facility], [address] [place], [place] [address]    Report Status 06/05/2019 FINAL  Final  Respiratory Panel by RT PCR (Flu A&B, Covid) - Nasopharyngeal Swab     Status: None   Collection Time: 05/31/19  3:55 PM   Specimen: Nasopharyngeal Swab  Result Value Ref Range Status   SARS Coronavirus 2 by RT PCR NEGATIVE NEGATIVE  Final    Comment: (NOTE) SARS-CoV-2 target nucleic acids are NOT DETECTED. The SARS-CoV-2 RNA is  generally detectable in upper respiratoy specimens during the acute phase of infection. The lowest concentration of SARS-CoV-2 viral copies this assay can detect is 131 copies/mL. A negative result does not preclude SARS-Cov-2 infection and should not be used as the sole basis for treatment or other patient management decisions. A negative result may occur with  improper specimen collection/handling, submission of specimen other than nasopharyngeal swab, presence of viral mutation(s) within the areas targeted by this assay, and inadequate number of viral copies (<131 copies/mL). A negative result must be combined with clinical observations, patient history, and epidemiological information. The expected result is Negative. Fact Sheet for Patients:  PinkCheek.be Fact Sheet for Healthcare Providers:  GravelBags.it This test is not yet ap proved or cleared by the [place] FDA and  has been authorized for detection and/or diagnosis of SARS-CoV-2 by FDA under an Emergency Use Authorization (EUA). This EUA will remain  in effect (meaning this test can be used) for the duration of the COVID-19 declaration under Section 564(b)(1) of the Act, 21 U.S.C. section 360bbb-3(b)(1), unless the authorization is terminated or revoked sooner.    Influenza A by PCR NEGATIVE NEGATIVE Final   Influenza B by PCR NEGATIVE NEGATIVE Final    Comment: (NOTE) The Xpert Xpress SARS-CoV-2/FLU/RSV assay is intended as an aid in  the diagnosis of influenza from Nasopharyngeal swab specimens and  should not be used as a sole basis for treatment. Nasal washings and  aspirates are unacceptable for Xpert Xpress SARS-CoV-2/FLU/RSV  testing. Fact Sheet for Patients: PinkCheek.be Fact Sheet for Healthcare  Providers: GravelBags.it This test is not yet approved or cleared by the [place] FDA and  has been authorized for detection and/or diagnosis of SARS-CoV-2 by  FDA under an Emergency Use Authorization (EUA). This EUA will remain  in effect (meaning this test can be used) for the duration of the  Covid-19 declaration under Section 564(b)(1) of the Act, 21  U.S.C. section 360bbb-3(b)(1), unless the authorization is  terminated or revoked. Performed at [facility], [address] [place], [place] [address]   MRSA PCR Screening     Status: None   Collection Time: 06/01/19 10:33 PM   Specimen: Nasopharyngeal  Result Value Ref Range Status   MRSA by PCR NEGATIVE NEGATIVE Final    Comment:        The GeneXpert MRSA Assay (FDA approved for NASAL specimens only), is one component of a comprehensive MRSA colonization surveillance program. It is not intended to diagnose MRSA infection nor to guide or monitor treatment for MRSA infections. Performed at [facility], [address] [place], [place] [address]      Management plans discussed with the patient, and she is agreement.  Spoke with the patient's sister and if the patient does better with the physical therapist she can go home with home health this evening.  CODE STATUS:     Code Status Orders  (From admission, onward)         Start     Ordered   05/31/19 1907  Full code  Continuous     05/31/19 1908        Code Status History    This patient has a current code status but no historical code status.   Advance Care Planning Activity      TOTAL TIME TAKING CARE OF THIS PATIENT: 34 minutes.    Loletha Grayer M.D on 06/07/2019 at 3:26 PM  Between 7am to 6pm - Pager - [phone]  After 6pm go to [email or web address] - password [email or web address]  [facility]  CC: Primary care physician; [name], MD

## 2019-06-07 NOTE — Care Management Important Message (Signed)
Important Message  Patient Details  Name: Sandra Massey MRN: [id number] Date of Birth: November 23, 1950   Medicare Important Message Given:  Yes  Reviewed with patient.  Per patient's request, called and reviewed Medicare IM with sister, [name]. Copy of Medicare IM left in patient's room for reference.    Dannette Barbara 06/07/2019, 1:24 PM

## 2019-06-07 NOTE — Progress Notes (Signed)
Pt discharged per MD order. IV removed. Discharge instructions reviewed with pt and caregiver. Pt taken downstairs in wheelchair by staff.

## 2019-06-07 NOTE — TOC Transition Note (Addendum)
Transition of Care [facility]) - CM/SW Discharge Note   Patient Details  Name: VICKIE MELNIK MRN: [id number] Date of Birth: [birth year]  [facility]) CM/SW Contact:  Candie Chroman, LCSW Phone Number: 06/07/2019, 3:57 PM   Clinical Narrative:  Patient has orders to discharge home today. [employer] representative is aware. Per [employer] representative, PM oxygen should have already been delivered to her home. Per RN, MD told her sister will pick her up. No further concerns. CSW signing off.   4:24 pm: PT recommending a rolling walker. Patient agreeable. Walker delivered to room. Asked MD for DME order.  Final next level of care: Home w Home Health Services Barriers to Discharge: Barriers Resolved   Patient Goals and CMS Choice        Discharge Placement                Patient to be transferred to facility by: Sister will take her home.   Patient and family notified of of transfer: 06/07/19  Discharge Plan and Services                DME Arranged: Oxygen DME Agency: [employer] Date DME Agency Contacted: 06/07/19   Representative spoke with at DME Agency: [place]: RN, PT, OT, Nurse's Aide [facility] Agency: [facility] Date [facility] Agency Contacted: 06/07/19   Representative spoke with at [place]: [place] ([place]) Interventions     Readmission Risk Interventions No flowsheet data found.

## 2019-06-07 NOTE — Progress Notes (Signed)
Physical Therapy Treatment Patient Details Name: Sandra Massey MRN: [id number] DOB: 1951-01-27 Today's Date: 06/07/2019    History of Present Illness Patient is a 69 year old admitted with DKA, severe sepsis due to UTI. PMH: known history of cognitive dysfunction, with type 2 diabetes mellitus, and hypertension as well as osteoarthritis,    PT Comments    Pt is making gradual progress towards goals with improved ability to ambulate in hallway. Still requires assist for bed mobility/transfers and ADLs (toileting/dressing). Chronic R hemiplegia, reports "hospital bed" at home to assist with bed mobility. Pt reports she has family assist at home. She typically uses SPC, however would benefit from use of RW for increased stability and decreased fall risk at this time. Able to use/steer with B hands with occasional cues for safety. Overall, improved functional independence. Updating recommendations to HHPT. RN notified.   Follow Up Recommendations  Home health PT;Supervision for mobility/OOB     Equipment Recommendations  Rolling walker with 5" wheels    Recommendations for Other Services       Precautions / Restrictions Precautions Precautions: Fall Restrictions Weight Bearing Restrictions: No    Mobility  Bed Mobility Overal bed mobility: Needs Assistance Bed Mobility: Supine to Sit     Supine to sit: Mod assist     General bed mobility comments: assist for B LEs. Log rolls onto L side and needs assist for hemiparetic side. Once seated, needs assist for scooting out towards EOB. Needs physical assist for donning shoes  Transfers Overall transfer level: Needs assistance Equipment used: Rolling walker (2 wheeled) Transfers: Sit to/from Stand Sit to Stand: Min assist         General transfer comment: needs physical placement of R hand on RW. Once standing, steady static posture  Ambulation/Gait Ambulation/Gait assistance: Min guard Gait Distance (Feet): 80  Feet Assistive device: Rolling walker (2 wheeled) Gait Pattern/deviations: Step-through pattern     General Gait Details: ambulated in hallway with slow gait pattern. Reciprocal gait with cues to keep closer to RW. Pt fatigues with increased distance with 2 standing rest breaks required,.    Stairs             Wheelchair Mobility    Modified Rankin (Stroke Patients Only)       Balance Overall balance assessment: Needs assistance Sitting-balance support: Feet supported Sitting balance-Leahy Scale: Good     Standing balance support: Bilateral upper extremity supported Standing balance-Leahy Scale: Fair                              Cognition Arousal/Alertness: Awake/alert Behavior During Therapy: Anxious Overall Cognitive Status: History of cognitive impairments - at baseline                                 General Comments: Pt appears with some cognitive delay, isn't able to directly express her needs verbally. She becomes anxious and frustrated when therapist doesn't understand her      Exercises Other Exercises Other Exercises: supine/seated ther-ex performed on B LE including LAQ, heel slides, and SLRs. All ther-ex performed x 10 reps with cga. Other Exercises: Pt ambulated to [facility] with cga. Able to perform hygiene with supervision, then needed min assist for finishing.    General Comments        Pertinent Vitals/Pain Pain Assessment: No/denies pain    Home Living  Prior Function            PT Goals (current goals can now be found in the care plan section) Acute Rehab PT Goals Patient Stated Goal: none stated PT Goal Formulation: With patient Time For Goal Achievement: 06/18/19 Potential to Achieve Goals: Good Progress towards PT goals: Progressing toward goals    Frequency    Min 2X/week      PT Plan Discharge plan needs to be updated    Co-evaluation              AM-PAC PT  "6 Clicks" Mobility   Outcome Measure  Help needed turning from your back to your side while in a flat bed without using bedrails?: A Lot Help needed moving from lying on your back to sitting on the side of a flat bed without using bedrails?: A Lot Help needed moving to and from a bed to a chair (including a wheelchair)?: A Little Help needed standing up from a chair using your arms (e.g., wheelchair or bedside chair)?: A Little Help needed to walk in hospital room?: A Little Help needed climbing 3-5 steps with a railing? : A Lot 6 Click Score: 15    End of Session Equipment Utilized During Treatment: Gait belt Activity Tolerance: Patient limited by fatigue Patient left: in bed;with bed alarm set Nurse Communication: Mobility status PT Visit Diagnosis: Other abnormalities of gait and mobility (R26.89);Muscle weakness (generalized) (M62.81);Difficulty in walking, not elsewhere classified (R26.2);Pain;Other symptoms and signs involving the nervous system (R29.898)     Time: 4496-7591 PT Time Calculation (min) (ACUTE ONLY): 32 min  Charges:  $Gait Training: 8-22 mins $Therapeutic Exercise: 8-22 mins                     Greggory Stallion, PT, DPT [phone]    Katerin Negrete 06/07/2019, 3:54 PM

## 2020-04-06 ENCOUNTER — Other Ambulatory Visit: Payer: Self-pay

## 2020-04-06 ENCOUNTER — Emergency Department: Payer: Medicare HMO

## 2020-04-06 ENCOUNTER — Observation Stay: Payer: Medicare HMO

## 2020-04-06 ENCOUNTER — Inpatient Hospital Stay
Admission: EM | Admit: 2020-04-06 | Discharge: 2020-04-13 | DRG: 563 | Disposition: A | Discharge status: Skilled Nursing Facility | Payer: Medicare HMO | Attending: Hospitalist | Admitting: Hospitalist

## 2020-04-06 DIAGNOSIS — I482 Chronic atrial fibrillation, unspecified: Secondary | ICD-10-CM | POA: Diagnosis not present

## 2020-04-06 DIAGNOSIS — Z79899 Other long term (current) drug therapy: Secondary | ICD-10-CM

## 2020-04-06 DIAGNOSIS — E1122 Type 2 diabetes mellitus with diabetic chronic kidney disease: Secondary | ICD-10-CM | POA: Diagnosis present

## 2020-04-06 DIAGNOSIS — N1831 Chronic kidney disease, stage 3a: Secondary | ICD-10-CM | POA: Diagnosis present

## 2020-04-06 DIAGNOSIS — N390 Urinary tract infection, site not specified: Secondary | ICD-10-CM

## 2020-04-06 DIAGNOSIS — R531 Weakness: Secondary | ICD-10-CM

## 2020-04-06 DIAGNOSIS — Z20822 Contact with and (suspected) exposure to covid-19: Secondary | ICD-10-CM | POA: Diagnosis present

## 2020-04-06 DIAGNOSIS — A419 Sepsis, unspecified organism: Secondary | ICD-10-CM

## 2020-04-06 DIAGNOSIS — Z91040 Latex allergy status: Secondary | ICD-10-CM

## 2020-04-06 DIAGNOSIS — I10 Essential (primary) hypertension: Secondary | ICD-10-CM

## 2020-04-06 DIAGNOSIS — S92009A Unspecified fracture of unspecified calcaneus, initial encounter for closed fracture: Secondary | ICD-10-CM | POA: Diagnosis present

## 2020-04-06 DIAGNOSIS — M65861 Other synovitis and tenosynovitis, right lower leg: Secondary | ICD-10-CM | POA: Diagnosis present

## 2020-04-06 DIAGNOSIS — Z6836 Body mass index (BMI) 36.0-36.9, adult: Secondary | ICD-10-CM

## 2020-04-06 DIAGNOSIS — F32A Depression, unspecified: Secondary | ICD-10-CM | POA: Diagnosis present

## 2020-04-06 DIAGNOSIS — Z794 Long term (current) use of insulin: Secondary | ICD-10-CM

## 2020-04-06 DIAGNOSIS — Z7901 Long term (current) use of anticoagulants: Secondary | ICD-10-CM

## 2020-04-06 DIAGNOSIS — I69351 Hemiplegia and hemiparesis following cerebral infarction affecting right dominant side: Secondary | ICD-10-CM

## 2020-04-06 DIAGNOSIS — S92031A Displaced avulsion fracture of tuberosity of right calcaneus, initial encounter for closed fracture: Secondary | ICD-10-CM | POA: Diagnosis not present

## 2020-04-06 DIAGNOSIS — W1830XA Fall on same level, unspecified, initial encounter: Secondary | ICD-10-CM | POA: Diagnosis present

## 2020-04-06 DIAGNOSIS — W19XXXA Unspecified fall, initial encounter: Secondary | ICD-10-CM

## 2020-04-06 DIAGNOSIS — I129 Hypertensive chronic kidney disease with stage 1 through stage 4 chronic kidney disease, or unspecified chronic kidney disease: Secondary | ICD-10-CM | POA: Diagnosis present

## 2020-04-06 DIAGNOSIS — R778 Other specified abnormalities of plasma proteins: Secondary | ICD-10-CM

## 2020-04-06 DIAGNOSIS — B9620 Unspecified Escherichia coli [E. coli] as the cause of diseases classified elsewhere: Secondary | ICD-10-CM | POA: Diagnosis present

## 2020-04-06 DIAGNOSIS — I248 Other forms of acute ischemic heart disease: Secondary | ICD-10-CM | POA: Diagnosis present

## 2020-04-06 DIAGNOSIS — E1165 Type 2 diabetes mellitus with hyperglycemia: Secondary | ICD-10-CM | POA: Diagnosis present

## 2020-04-06 DIAGNOSIS — E1142 Type 2 diabetes mellitus with diabetic polyneuropathy: Secondary | ICD-10-CM | POA: Diagnosis present

## 2020-04-06 DIAGNOSIS — S92001A Unspecified fracture of right calcaneus, initial encounter for closed fracture: Secondary | ICD-10-CM | POA: Diagnosis present

## 2020-04-06 DIAGNOSIS — Z66 Do not resuscitate: Secondary | ICD-10-CM | POA: Diagnosis present

## 2020-04-06 LAB — URINALYSIS, COMPLETE (UACMP) WITH MICROSCOPIC
Bilirubin Urine: NEGATIVE
Glucose, UA: NEGATIVE mg/dL
Ketones, ur: NEGATIVE mg/dL
Nitrite: NEGATIVE
Protein, ur: NEGATIVE mg/dL
Specific Gravity, Urine: 1.017 (ref 1.005–1.030)
pH: 7 (ref 5.0–8.0)

## 2020-04-06 LAB — CBC
HCT: 40.8 % (ref 36.0–46.0)
Hemoglobin: 13.1 g/dL (ref 12.0–15.0)
MCH: 28.8 pg (ref 26.0–34.0)
MCHC: 32.1 g/dL (ref 30.0–36.0)
MCV: 89.7 fL (ref 80.0–100.0)
Platelets: 224 10*3/uL (ref 150–400)
RBC: 4.55 MIL/uL (ref 3.87–5.11)
RDW: 13.9 % (ref 11.5–15.5)
WBC: 12.8 10*3/uL — ABNORMAL HIGH (ref 4.0–10.5)
nRBC: 0 % (ref 0.0–0.2)

## 2020-04-06 LAB — COMPREHENSIVE METABOLIC PANEL
ALT: 32 U/L (ref 0–44)
AST: 30 U/L (ref 15–41)
Albumin: 3.7 g/dL (ref 3.5–5.0)
Alkaline Phosphatase: 100 U/L (ref 38–126)
Anion gap: 9 (ref 5–15)
BUN: 28 mg/dL — ABNORMAL HIGH (ref 8–23)
CO2: 29 mmol/L (ref 22–32)
Calcium: 9.7 mg/dL (ref 8.9–10.3)
Chloride: 100 mmol/L (ref 98–111)
Creatinine, Ser: 1.07 mg/dL — ABNORMAL HIGH (ref 0.44–1.00)
GFR, Estimated: 57 mL/min — ABNORMAL LOW (ref >60)
Glucose, Bld: 205 mg/dL — ABNORMAL HIGH (ref 70–99)
Potassium: 4.6 mmol/L (ref 3.5–5.1)
Sodium: 138 mmol/L (ref 135–145)
Total Bilirubin: 1 mg/dL (ref 0.3–1.2)
Total Protein: 8 g/dL (ref 6.5–8.1)

## 2020-04-06 LAB — TROPONIN I (HIGH SENSITIVITY)
Troponin I (High Sensitivity): 85 ng/L — ABNORMAL HIGH (ref <18)
Troponin I (High Sensitivity): 106 ng/L (ref <18)
Troponin I (High Sensitivity): 101 ng/L (ref <18)

## 2020-04-06 LAB — BRAIN NATRIURETIC PEPTIDE: B Natriuretic Peptide: 201.4 pg/mL — ABNORMAL HIGH (ref 0.0–100.0)

## 2020-04-06 LAB — TSH: TSH: 3.578 u[IU]/mL (ref 0.350–4.500)

## 2020-04-06 LAB — PROTIME-INR
INR: 1.3 — ABNORMAL HIGH (ref 0.8–1.2)
Prothrombin Time: 15.6 seconds — ABNORMAL HIGH (ref 11.4–15.2)

## 2020-04-06 LAB — CBG MONITORING, ED: Glucose-Capillary: 185 mg/dL — ABNORMAL HIGH (ref 70–99)

## 2020-04-06 MED ORDER — SODIUM CHLORIDE 0.9 % IV SOLN
1.0000 g | Freq: Once | INTRAVENOUS | Status: AC
  Administered 2020-04-06: 1 g via INTRAVENOUS
  Filled 2020-04-06: qty 10

## 2020-04-06 MED ORDER — SODIUM CHLORIDE 0.9 % IV SOLN: 1.0000 g | INTRAVENOUS | Status: DC

## 2020-04-06 MED ORDER — INSULIN GLARGINE 100 UNIT/ML SOLOSTAR PEN: 32.0000 [IU] | PEN_INJECTOR | Freq: Every day | SUBCUTANEOUS | Status: DC

## 2020-04-06 MED ORDER — ACETAMINOPHEN 325 MG PO TABS
325.0000 mg | ORAL_TABLET | Freq: Four times a day (QID) | ORAL | Status: DC | PRN
  Administered 2020-04-09 – 2020-04-12 (×5): 325 mg via ORAL
  Filled 2020-04-06 (×5): qty 1

## 2020-04-06 MED ORDER — MORPHINE SULFATE (PF) 2 MG/ML IV SOLN
1.0000 mg | INTRAVENOUS | Status: AC | PRN
  Administered 2020-04-07 – 2020-04-08 (×8): 1 mg via INTRAVENOUS
  Filled 2020-04-06 (×8): qty 1

## 2020-04-06 MED ORDER — ROSUVASTATIN CALCIUM 10 MG PO TABS
40.0000 mg | ORAL_TABLET | Freq: Every day | ORAL | Status: DC
  Administered 2020-04-07 – 2020-04-13 (×7): 40 mg via ORAL
  Filled 2020-04-06 (×8): qty 4

## 2020-04-06 MED ORDER — INSULIN GLARGINE 100 UNIT/ML ~~LOC~~ SOLN
32.0000 [IU] | Freq: Every day | SUBCUTANEOUS | Status: DC
  Administered 2020-04-07 – 2020-04-10 (×4): 32 [IU] via SUBCUTANEOUS
  Filled 2020-04-06 (×4): qty 0.32

## 2020-04-06 MED ORDER — INSULIN ASPART 100 UNIT/ML ~~LOC~~ SOLN
0.0000 [IU] | Freq: Every day | SUBCUTANEOUS | Status: DC
  Administered 2020-04-07: 3 [IU] via SUBCUTANEOUS
  Administered 2020-04-07 – 2020-04-08 (×2): 4 [IU] via SUBCUTANEOUS
  Administered 2020-04-09 – 2020-04-11 (×2): 3 [IU] via SUBCUTANEOUS
  Filled 2020-04-06 (×5): qty 1

## 2020-04-06 MED ORDER — ACETAMINOPHEN 650 MG RE SUPP: 325.0000 mg | Freq: Four times a day (QID) | RECTAL | Status: DC | PRN

## 2020-04-06 MED ORDER — VANCOMYCIN HCL IN DEXTROSE 1-5 GM/200ML-% IV SOLN
1000.0000 mg | Freq: Once | INTRAVENOUS | Status: AC
  Administered 2020-04-07: 1000 mg via INTRAVENOUS
  Filled 2020-04-06: qty 200

## 2020-04-06 MED ORDER — VANCOMYCIN HCL IN DEXTROSE 1-5 GM/200ML-% IV SOLN
1000.0000 mg | INTRAVENOUS | Status: DC
  Filled 2020-04-06: qty 200

## 2020-04-06 MED ORDER — AMIODARONE HCL 200 MG PO TABS
200.0000 mg | ORAL_TABLET | Freq: Two times a day (BID) | ORAL | Status: DC
Start: 2020-04-06 — End: 2020-04-07
  Administered 2020-04-07: 200 mg via ORAL
  Filled 2020-04-06: qty 1

## 2020-04-06 MED ORDER — INSULIN ASPART 100 UNIT/ML FLEXPEN
7.0000 [IU] | PEN_INJECTOR | Freq: Three times a day (TID) | SUBCUTANEOUS | Status: DC
  Filled 2020-04-06: qty 3

## 2020-04-06 MED ORDER — SERTRALINE HCL 50 MG PO TABS
50.0000 mg | ORAL_TABLET | Freq: Every day | ORAL | Status: DC
  Administered 2020-04-07 – 2020-04-13 (×7): 50 mg via ORAL
  Filled 2020-04-06 (×7): qty 1

## 2020-04-06 MED ORDER — HEPARIN SODIUM (PORCINE) 5000 UNIT/ML IJ SOLN
5000.0000 [IU] | Freq: Three times a day (TID) | INTRAMUSCULAR | Status: DC
  Administered 2020-04-07 – 2020-04-08 (×5): 5000 [IU] via SUBCUTANEOUS
  Filled 2020-04-06 (×5): qty 1

## 2020-04-06 MED ORDER — VANCOMYCIN HCL 1500 MG/300ML IV SOLN
1500.0000 mg | Freq: Once | INTRAVENOUS | Status: AC
  Administered 2020-04-07: 1500 mg via INTRAVENOUS
  Filled 2020-04-06: qty 300

## 2020-04-06 MED ORDER — INSULIN ASPART 100 UNIT/ML ~~LOC~~ SOLN
0.0000 [IU] | Freq: Three times a day (TID) | SUBCUTANEOUS | Status: DC
  Administered 2020-04-07: 15 [IU] via SUBCUTANEOUS
  Administered 2020-04-07 – 2020-04-08 (×4): 11 [IU] via SUBCUTANEOUS
  Administered 2020-04-08: 4 [IU] via SUBCUTANEOUS
  Administered 2020-04-09 (×2): 20 [IU] via SUBCUTANEOUS
  Administered 2020-04-09 – 2020-04-10 (×2): 11 [IU] via SUBCUTANEOUS
  Administered 2020-04-11: 20 [IU] via SUBCUTANEOUS
  Administered 2020-04-11 – 2020-04-12 (×3): 11 [IU] via SUBCUTANEOUS
  Administered 2020-04-12 (×2): 20 [IU] via SUBCUTANEOUS
  Administered 2020-04-13: 11 [IU] via SUBCUTANEOUS
  Filled 2020-04-06 (×17): qty 1

## 2020-04-06 MED ORDER — ONDANSETRON HCL 4 MG/2ML IJ SOLN
4.0000 mg | Freq: Four times a day (QID) | INTRAMUSCULAR | Status: DC | PRN
  Administered 2020-04-12: 4 mg via INTRAVENOUS
  Filled 2020-04-06: qty 2

## 2020-04-06 MED ORDER — ONDANSETRON HCL 4 MG PO TABS: 4.0000 mg | ORAL_TABLET | Freq: Four times a day (QID) | ORAL | Status: DC | PRN

## 2020-04-06 MED ORDER — SODIUM CHLORIDE 0.9 % IV SOLN
2.0000 g | Freq: Three times a day (TID) | INTRAVENOUS | Status: DC
  Administered 2020-04-07 (×2): 2 g via INTRAVENOUS
  Filled 2020-04-06 (×4): qty 2

## 2020-04-06 NOTE — ED Notes (Signed)
CHANGED PT'S LINEN. BELONGINGS PLACED IN BAG.

## 2020-04-06 NOTE — H&P (Addendum)
History and Physical   Sandra Massey [id number] DOB: 1951/02/24 DOA: 04/06/2020  PCP: [name], MD  Outpatient Specialists: Dr. Gardiner Barefoot, podiatry Patient coming from: Home via EMS  I have personally briefly reviewed patient's old medical records in [place].  Chief Concern: Fall and right sided leg pain.  HPI: Sandra Massey is a 70 y.o. female with medical history significant for history of old left MCA territory infarct, with right-sided hemiparesis, chronic A. fib, insulin-dependent diabetes mellitus, hypertension, abdominal obesity, presented to the emergency department for chief concerns of mechanical fall.  At bedside, patient has chronic difficulty speaking.  She states that she fell twice today and endorses back pain and right foot pain.  She also endorses increased urination.  She denies fever, shortness of breath, chest pain, abdominal pain.  She states that she is a Jehovah witness and does not want blood transfusion.  She does not want chest compressions and intubation should she have cardiac arrest.  ROS: Constitutional: no weight change, no fever ENT/Mouth: no sore throat, no rhinorrhea Eyes: no eye pain, no vision changes Cardiovascular: no chest pain, no dyspnea,  no edema, no palpitations Respiratory: no cough, no sputum, no wheezing Gastrointestinal: no nausea, no vomiting, no diarrhea, no constipation Genitourinary: no urinary incontinence, no dysuria, no hematuria Musculoskeletal: no arthralgias, + myalgias Skin: no skin lesions, no pruritus, Neuro: + weakness, no loss of consciousness, no syncope Psych: no anxiety, no depression, + decrease appetite Heme/Lymph: no bruising, no bleeding  ED Course: Discussed with ED provider, patient requiring hospitalization due to right calcaneal fracture secondary to mechanical fall.  Vitals in the emergency department was afebrile, respiration rate was reassuring, blood pressure was appropriate and  maintaining MAP.  She was saturating at 95% on room air.  Assessment/Plan  Principal Problem:   Closed right calcaneal fracture Active Problems:   Chronic a-fib (HCC)   Weakness   Essential hypertension   Closed right calcaneal fracture-podiatry has been consulted, Dr. Luana Shu -Possible surgery tomorrow pending surgical/OR schedule -MRI without contrast of the foot has been ordered per podiatry -Podiatry recommends: place compression wrap/compressive sleeve to the right foot swelling control, then place patient in a tall boot. She needs to be nonweightbearing. -Podiatry recommendations for compression wrap/compression sleeve, nonweightbearing, placing patient foot in tall boot has been communicated via nursing communication instruction in epic  Elevated troponin-we will continue to follow -Complete echo ordered -Continue to trend  UTI-Ceftriaxone 1 g IV once per ED provider -Urine culture ordered  Sepsis has not been ruled out-blood cultures ordered and urine cultures ordered  Leukocytosis-suspect secondary to urinary tract infection however sepsis has not been excluded -Blood cultures x2 ordered -Cefepime and vancomycin per pharmacy, as patient is hemodynamically stable I have asked nursing and pharmacy staff to hold further antibiotic treatment pending blood cultures  Insulin-dependent diabetes mellitus-checking A1c -Resumed home insulin glargine 32 units daily, insulin aspart 7 units subcutaneous 3 times daily AC -Insulin SSI ordered  Atrial fibrillation-chronic, resumed home amiodarone 200 mg p.o. twice daily -Eliquis 5 mg twice daily holding due to possible OR on 04/07/2020  Old left MCA territory infarct-with known right hemiparesis neurology follow-up outpatient Chart reviewed.   Anxiety/depression-resumed home sertraline 50 mg daily  From 05/31/2018 123/29/2021: Patient was admitted for lactic acidosis and DKA with sepsis associated hypotension and new onset atrial  fibrillation.  As needed medications: Ondansetron, acetaminophen, morphine for pain control, insulin SSI DVT prophylaxis: Heparin Code Status: DNR Diet: N.p.o. at midnight Family Communication:  No Disposition Plan: Pending clinical course Consults called: Podiatry Admission status: Observation with telemetry  Past Medical History:  Diagnosis Date  . Arthritis   . Diabetes mellitus without complication ([place])   . Hypertension   . Substance abuse [facility])    Past Surgical History:  Procedure Laterality Date  . BREAST BIOPSY Right    sebaceous cyst removed   Social History:  reports that she has never smoked. She has never used smokeless tobacco. She reports that she does not drink alcohol and does not use drugs.  Allergies  Allergen Reactions  . Latex Rash   No family history on file. Family history: Family history reviewed and not pertinent  Prior to Admission medications   Medication Sig Start Date End Date Taking? Authorizing Provider  amiodarone (PACERONE) 200 MG tablet Take 1 tablet (200 mg total) by mouth 2 (two) times daily. 06/06/19   Loletha Grayer, MD  calcium carbonate (OS-CAL) 600 MG TABS tablet Take 600 mg by mouth daily.    [provider]  cholecalciferol (VITAMIN D) 1000 UNITS tablet Take 1,000 Units by mouth daily.    [provider]  ELIQUIS 5 MG TABS tablet Take 5 mg by mouth 2 (two) times daily. 03/11/19   [provider]  insulin aspart (NOVOLOG) 100 UNIT/ML FlexPen Inject 7 Units into the skin 3 (three) times daily with meals. 06/07/19   Loletha Grayer, MD  insulin glargine (LANTUS) 100 UNIT/ML Solostar Pen Inject 32 Units into the skin daily. 06/07/19   Loletha Grayer, MD  Insulin Pen Needle 31G X 8 MM MISC 1 needle with each injection of insulin 06/07/19   Loletha Grayer, MD  rosuvastatin (CRESTOR) 40 MG tablet Take 40 mg by mouth daily. 05/06/19   [provider]  sertraline (ZOLOFT) 50 MG tablet Take 50 mg by mouth  daily. 05/03/19   [provider]   Physical Exam: Vitals:   04/06/20 1501 04/06/20 1754 04/06/20 1800 04/06/20 2117  BP:  (!) 157/80 (!) 142/99 132/68  Pulse:  79 68 68  Resp:  20  16  Temp:      TempSrc:      SpO2:  93% 97% 93%  Weight: 102.1 kg     Height: '5\' 6"'$  (1.676 m)      Constitutional: appears age-appropriate, NAD, calm, comfortable Eyes: PERRL, lids and conjunctivae normal ENMT: Mucous membranes are moist. Posterior pharynx clear of any exudate or lesions. Age-appropriate dentition. Hearing appropriate Neck: normal, supple, no masses, no thyromegaly Respiratory: clear to auscultation bilaterally, no wheezing, no crackles. Normal respiratory effort. No accessory muscle use.  Cardiovascular: Regular rate and rhythm, no murmurs / rubs / gallops. No extremity edema. 2+ pedal pulses. No carotid bruits.  Abdomen: obese abdomen, no tenderness, no masses palpated, no hepatosplenomegaly. Bowel sounds positive.  Musculoskeletal: Right calcaneal avulsion fracture and moving all extremities Skin: Right lower extremity skin lesions in the anterior leg presents Neurologic: Sensation intact. Strength 5/5 in all 4.  Psychiatric: Normal judgment and insight. Alert and oriented x 3. Normal mood.   EKG: independently reviewed, showing atrial fibrillation, new right bundle branch block, QTC 473  Chest x-ray on Admission: I personally reviewed and I agree with radiologist reading as below.  DG Chest 2 View  Result Date: 04/06/2020 CLINICAL DATA:  Status post fall. EXAM: CHEST - 2 VIEW COMPARISON:  Chest radiograph May 31, 2019. FINDINGS: Stable enlarged cardiac and mediastinal contours. Aortic atherosclerosis. Bilateral mid lower lung patchy airspace opacities. No pleural effusion or  pneumothorax. Thoracic spine degenerative changes. IMPRESSION: Bilateral mid and lower lung patchy opacities which may represent atelectasis or infection. Electronically Signed   By: Lovey Newcomer M.D.    On: 04/06/2020 18:59   DG Shoulder Right  Result Date: 04/06/2020 CLINICAL DATA:  Recent fall with right shoulder pain, initial encounter EXAM: RIGHT SHOULDER - 2+ VIEW COMPARISON:  None. FINDINGS: Degenerative changes of the acromioclavicular and glenohumeral joints are seen. The humeral head is high-riding likely related to chronic rotator cuff injury. The underlying bony thorax appears within normal limits. No other focal abnormality is seen. IMPRESSION: Degenerative change without acute abnormality. Electronically Signed   By: Inez Catalina M.D.   On: 04/06/2020 16:22   DG Tibia/Fibula Right  Result Date: 04/06/2020 CLINICAL DATA:  Recent fall with right leg pain, initial encounter EXAM: RIGHT TIBIA AND FIBULA - 2 VIEW COMPARISON:  None. FINDINGS: Tibia and fibula are within normal limits. There is an avulsion from the superior aspect of the calcaneus at the level of the Achilles insertion. No other focal abnormality is seen. IMPRESSION: Avulsion from the superior aspect of the calcaneus. Tibia and fibula are within normal limits. Electronically Signed   By: Inez Catalina M.D.   On: 04/06/2020 16:22   CT Head Wo Contrast  Result Date: 04/06/2020 CLINICAL DATA:  Patient status post fall. EXAM: CT HEAD WITHOUT CONTRAST TECHNIQUE: Contiguous axial images were obtained from the base of the skull through the vertex without intravenous contrast. COMPARISON:  Brain CT 03/17/2019. FINDINGS: Brain: Ventricles and sulci are appropriate for patient's age. Redemonstrated encephalomalacia from chronic left MCA territory infarct. No evidence for acute cortically based infarct, intracranial hemorrhage, mass lesion or mass-effect. Vascular: Unremarkable. Skull: Intact. Sinuses/Orbits: Paranasal sinuses are well aerated. Mastoid air cells are unremarkable. Orbits are unremarkable. Other: None. IMPRESSION: 1. No acute intracranial process. 2. Old left MCA territory infarct. Electronically Signed   By: Lovey Newcomer M.D.    On: 04/06/2020 16:00   DG Pelvis Portable  Result Date: 04/06/2020 CLINICAL DATA:  [place], right-sided pain EXAM: PORTABLE PELVIS 1-2 VIEWS COMPARISON:  04/06/2020 FINDINGS: Supine frontal view of the pelvis demonstrates no acute displaced fractures. Enthesopathic changes are seen at the greater trochanters bilaterally, right greater than left. Symmetrical bilateral hip osteoarthritis. No subluxation or dislocation. There is mild sclerosis of the left SI joint. No erosive changes. Prominent degenerative changes at the lumbosacral junction. IMPRESSION: 1. No acute displaced fracture. If hip fracture remains a concern, follow-up CT or MRI could be considered. 2. Degenerative changes as above. Electronically Signed   By: Randa Ngo M.D.   On: 04/06/2020 17:02   DG Femur Min 2 Views Right  Result Date: 04/06/2020 CLINICAL DATA:  Recent fall with right hip pain, initial encounter EXAM: RIGHT FEMUR 2 VIEWS COMPARISON:  None. FINDINGS: Degenerative changes of the right hip joint are noted. No acute fracture or dislocation is seen. No soft tissue abnormalities are noted. Mild degenerative changes of the knee joint are seen as well. IMPRESSION: No acute abnormality noted. Electronically Signed   By: Inez Catalina M.D.   On: 04/06/2020 16:20   Labs on Admission: I have personally reviewed following labs  CBC: Recent Labs  Lab 04/06/20 1503  WBC 12.8*  HGB 13.1  HCT 40.8  MCV 89.7  PLT [id number]   Basic Metabolic Panel: Recent Labs  Lab 04/06/20 1503  NA 138  K 4.6  CL 100  CO2 29  GLUCOSE 205*  BUN 28*  CREATININE 1.07*  CALCIUM 9.7   GFR: Estimated Creatinine Clearance: 60.7 mL/min (A) (by C-G formula based on SCr of 1.07 mg/dL (H)).  Liver Function Tests: Recent Labs  Lab 04/06/20 1503  AST 30  ALT 32  ALKPHOS 100  BILITOT 1.0  PROT 8.0  ALBUMIN 3.7   CBG: Recent Labs  Lab 04/06/20 1505  GLUCAP 185*   Urine analysis:    Component Value Date/Time   COLORURINE YELLOW (A)  04/06/2020 1945   APPEARANCEUR HAZY (A) 04/06/2020 1945   LABSPEC 1.017 04/06/2020 1945   PHURINE 7.0 04/06/2020 1945   GLUCOSEU NEGATIVE 04/06/2020 1945   HGBUR MODERATE (A) 04/06/2020 1945   BILIRUBINUR NEGATIVE 04/06/2020 1945   KETONESUR NEGATIVE 04/06/2020 1945   PROTEINUR NEGATIVE 04/06/2020 1945   NITRITE NEGATIVE 04/06/2020 1945   LEUKOCYTESUR TRACE (A) 04/06/2020 1945   Sandra Massey D.O. [facility]  If 7PM-7AM, please contact overnight-coverage provider If 7AM-7PM, please contact day coverage provider [email or web address]  04/06/2020, 10:11 PM

## 2020-04-06 NOTE — Progress Notes (Signed)
   04/06/20 2140  Provider Notification  Provider Name/Title Eugenie Norrie, MD  Date Provider Notified 04/06/20  Time Provider Notified 2145  Notification Type Page  Notification Reason Other (Comment) (critical lab)  Received critical lab Troponin -106. Notified provider.

## 2020-04-06 NOTE — Progress Notes (Signed)
Pharmacy Antibiotic Note  Sandra Massey is a 70 y.o. female admitted on 04/06/2020 with sepsis.  Pharmacy has been consulted for Vancomycin, Cefepime dosing.  CrCl = 60.7 ml/min  Plan: Cefepime 2 gm IV Q8H ordered to start on 1/27 @ 2230.   Vancomycin 2500 mg IV X 1 ordered for 1/27 @ 2230.  Vancomycin 1 gm IV Q24H ordered to start on 1/29 @ 0000.  AUC = 450.3  Vanc trough = 11.1   Height: '5\' 6"'$  (167.6 cm) Weight: 102.1 kg (225 lb) IBW/kg (Calculated) : 59.3  Temp (24hrs), Avg:97.6 F (36.4 C), Min:97.4 F (36.3 C), Max:97.7 F (36.5 C)  Recent Labs  Lab 04/06/20 1503  WBC 12.8*  CREATININE 1.07*    Estimated Creatinine Clearance: 60.7 mL/min (A) (by C-G formula based on SCr of 1.07 mg/dL (H)).    Allergies  Allergen Reactions  . Latex Rash    Antimicrobials this admission:   >>    >>   Dose adjustments this admission:   Microbiology results:  BCx:   UCx:    Sputum:    MRSA PCR:   Thank you for allowing pharmacy to be a part of this patient's care.  Rasul Decola D 04/06/2020 10:35 PM

## 2020-04-06 NOTE — ED Provider Notes (Signed)
[facility] Emergency Department Provider Note   ____________________________________________   Event Date/Time   First MD Initiated Contact with Patient 04/06/20 1745     (approximate)  I have reviewed the triage vital signs and the nursing notes.   HISTORY  Chief Complaint Fall   HPI Sandra Massey is a 70 y.o. female who fell at home.  She has a stroke with right-sided deficits.  She had no dizziness.  She is fallen twice today and complains of some pain in her back especially the right side of her back hip and right shoulder and right leg.  She also reports she is urinating on and off a lot.         Past Medical History:  Diagnosis Date  . Arthritis   . Diabetes mellitus without complication ([place])   . Hypertension   . Substance abuse [facility])     Patient Active Problem List   Diagnosis Date Noted  . Pressure injury of skin 06/05/2019  . Weakness   . Sepsis with acute renal failure and septic shock ([place]) 06/01/2019  . Acute kidney injury (AKI) with acute tubular necrosis (ATN) ([facility])   . Lactic acidosis   . Chronic a-fib ([place])   . Kidney stones   . DKA (diabetic ketoacidoses) 05/31/2019    Past Surgical History:  Procedure Laterality Date  . BREAST BIOPSY Right    sebaceous cyst removed    Prior to Admission medications   Medication Sig Start Date End Date Taking? Authorizing Provider  amiodarone (PACERONE) 200 MG tablet Take 1 tablet (200 mg total) by mouth 2 (two) times daily. 06/06/19   Loletha Grayer, MD  calcium carbonate (OS-CAL) 600 MG TABS tablet Take 600 mg by mouth daily.    [provider]  cholecalciferol (VITAMIN D) 1000 UNITS tablet Take 1,000 Units by mouth daily.    [provider]  ELIQUIS 5 MG TABS tablet Take 5 mg by mouth 2 (two) times daily. 03/11/19   [provider]  insulin aspart (NOVOLOG) 100 UNIT/ML FlexPen Inject 7 Units into the skin 3 (three) times daily with meals. 06/07/19    Loletha Grayer, MD  insulin glargine (LANTUS) 100 UNIT/ML Solostar Pen Inject 32 Units into the skin daily. 06/07/19   Loletha Grayer, MD  Insulin Pen Needle 31G X 8 MM MISC 1 needle with each injection of insulin 06/07/19   Loletha Grayer, MD  rosuvastatin (CRESTOR) 40 MG tablet Take 40 mg by mouth daily. 05/06/19   [provider]  sertraline (ZOLOFT) 50 MG tablet Take 50 mg by mouth daily. 05/03/19   [provider]    Allergies Latex  No family history on file.  Social History Social History   Tobacco Use  . Smoking status: Never Smoker  . Smokeless tobacco: Never Used  . Tobacco comment: 2007 quit   Substance Use Topics  . Alcohol use: Never  . Drug use: Never    Review of Systems  Constitutional: No fever/chills Eyes: No visual changes. ENT: No sore throat. Cardiovascular: Denies chest pain see below musculoskeletal. Respiratory: Denies shortness of breath. Gastrointestinal: No abdominal pain.  No nausea, no vomiting.  No diarrhea.  No constipation. Genitourinary: Negative for dysuria. Musculoskeletal: Patient has no spine pain but she has pain on the right side of the back chest.  She also has pain in her right leg. Skin: Negative for rash. Neurological: Negative for headaches, focal weakness   ____________________________________________   PHYSICAL EXAM:  VITAL  SIGNS: ED Triage Vitals  Enc Vitals Group     BP 04/06/20 1500 (!) 166/63     Pulse Rate 04/06/20 1500 87     Resp 04/06/20 1500 (!) 22     Temp 04/06/20 1500 97.7 F (36.5 C)     Temp Source 04/06/20 1500 Oral     SpO2 04/06/20 1500 95 %     Weight 04/06/20 1501 225 lb (102.1 kg)     Height 04/06/20 1501 '5\' 6"'$  (1.676 m)     Head Circumference --      Peak Flow --      Pain Score 04/06/20 1501 10     Pain Loc --      Pain Edu? --      Excl. in [place]? --     Constitutional: Alert and oriented. Well appearing and in no acute distress. Eyes: Conjunctivae are normal.  PER Head: Atraumatic. Nose: No congestion/rhinnorhea. Mouth/Throat: Mucous membranes are moist.  Neck: No stridor.  Cardiovascular: Normal rate, regular rhythm. Grossly normal heart sounds.  Good peripheral circulation. Respiratory: Normal respiratory effort.  No retractions.  Gastrointestinal: Soft and nontender. No distention. No abdominal bruits. No CVA tenderness. Musculoskeletal: No lower extremity tenderness nor edema.  Patient complains of some pain in the right shoulder minimal pain in the right hip.  She has more pain on the right side of the chest posteriorly and pain in the right heel. Neurologic: No change in baseline speech deficit from her old stroke. No new gross focal neurologic deficits are appreciated.  Skin:  Skin is warm, dry and intact. No rash noted.   ____________________________________________   LABS (all labs ordered are listed, but only abnormal results are displayed)  Labs Reviewed  CBC - Abnormal; Notable for the following components:      Result Value   WBC 12.8 (*)    All other components within normal limits  COMPREHENSIVE METABOLIC PANEL - Abnormal; Notable for the following components:   Glucose, Bld 205 (*)    BUN 28 (*)    Creatinine, Ser 1.07 (*)    GFR, Estimated 57 (*)    All other components within normal limits  URINALYSIS, COMPLETE (UACMP) WITH MICROSCOPIC - Abnormal; Notable for the following components:   Color, Urine YELLOW (*)    APPearance HAZY (*)    Hgb urine dipstick MODERATE (*)    Leukocytes,Ua TRACE (*)    Bacteria, UA RARE (*)    All other components within normal limits  CBG MONITORING, ED - Abnormal; Notable for the following components:   Glucose-Capillary 185 (*)    All other components within normal limits  TROPONIN I (HIGH SENSITIVITY) - Abnormal; Notable for the following components:   Troponin I (High Sensitivity) 85 (*)    All other components within normal limits  SARS CORONAVIRUS 2 (TAT 6-24 HRS)  TROPONIN I  (HIGH SENSITIVITY)   ____________________________________________  EKG  EKG read interpreted by me shows A. fib at 59 which is old she might be a flutter at any rate she has a normal axis very very irregular baseline there may be some ST segment downsloping inferiorly and laterally it is very difficult to tell.  She does have a right bundle branch block which is new from last year. ____________________________________________  RADIOLOGY Gertha Calkin, personally viewed and evaluated these images (plain radiographs) as part of my medical decision making, as well as reviewing the written report by the radiologist.  ED MD interpretation:  Official radiology report(s): DG Chest 2 View  Result Date: 04/06/2020 CLINICAL DATA:  Status post fall. EXAM: CHEST - 2 VIEW COMPARISON:  Chest radiograph May 31, 2019. FINDINGS: Stable enlarged cardiac and mediastinal contours. Aortic atherosclerosis. Bilateral mid lower lung patchy airspace opacities. No pleural effusion or pneumothorax. Thoracic spine degenerative changes. IMPRESSION: Bilateral mid and lower lung patchy opacities which may represent atelectasis or infection. Electronically Signed   By: Lovey Newcomer M.D.   On: 04/06/2020 18:59   DG Shoulder Right  Result Date: 04/06/2020 CLINICAL DATA:  Recent fall with right shoulder pain, initial encounter EXAM: RIGHT SHOULDER - 2+ VIEW COMPARISON:  None. FINDINGS: Degenerative changes of the acromioclavicular and glenohumeral joints are seen. The humeral head is high-riding likely related to chronic rotator cuff injury. The underlying bony thorax appears within normal limits. No other focal abnormality is seen. IMPRESSION: Degenerative change without acute abnormality. Electronically Signed   By: Inez Catalina M.D.   On: 04/06/2020 16:22   DG Tibia/Fibula Right  Result Date: 04/06/2020 CLINICAL DATA:  Recent fall with right leg pain, initial encounter EXAM: RIGHT TIBIA AND FIBULA - 2 VIEW  COMPARISON:  None. FINDINGS: Tibia and fibula are within normal limits. There is an avulsion from the superior aspect of the calcaneus at the level of the Achilles insertion. No other focal abnormality is seen. IMPRESSION: Avulsion from the superior aspect of the calcaneus. Tibia and fibula are within normal limits. Electronically Signed   By: Inez Catalina M.D.   On: 04/06/2020 16:22   CT Head Wo Contrast  Result Date: 04/06/2020 CLINICAL DATA:  Patient status post fall. EXAM: CT HEAD WITHOUT CONTRAST TECHNIQUE: Contiguous axial images were obtained from the base of the skull through the vertex without intravenous contrast. COMPARISON:  Brain CT 03/17/2019. FINDINGS: Brain: Ventricles and sulci are appropriate for patient's age. Redemonstrated encephalomalacia from chronic left MCA territory infarct. No evidence for acute cortically based infarct, intracranial hemorrhage, mass lesion or mass-effect. Vascular: Unremarkable. Skull: Intact. Sinuses/Orbits: Paranasal sinuses are well aerated. Mastoid air cells are unremarkable. Orbits are unremarkable. Other: None. IMPRESSION: 1. No acute intracranial process. 2. Old left MCA territory infarct. Electronically Signed   By: Lovey Newcomer M.D.   On: 04/06/2020 16:00   DG Pelvis Portable  Result Date: 04/06/2020 CLINICAL DATA:  [place], right-sided pain EXAM: PORTABLE PELVIS 1-2 VIEWS COMPARISON:  04/06/2020 FINDINGS: Supine frontal view of the pelvis demonstrates no acute displaced fractures. Enthesopathic changes are seen at the greater trochanters bilaterally, right greater than left. Symmetrical bilateral hip osteoarthritis. No subluxation or dislocation. There is mild sclerosis of the left SI joint. No erosive changes. Prominent degenerative changes at the lumbosacral junction. IMPRESSION: 1. No acute displaced fracture. If hip fracture remains a concern, follow-up CT or MRI could be considered. 2. Degenerative changes as above. Electronically Signed   By: Randa Ngo M.D.   On: 04/06/2020 17:02   DG Femur Min 2 Views Right  Result Date: 04/06/2020 CLINICAL DATA:  Recent fall with right hip pain, initial encounter EXAM: RIGHT FEMUR 2 VIEWS COMPARISON:  None. FINDINGS: Degenerative changes of the right hip joint are noted. No acute fracture or dislocation is seen. No soft tissue abnormalities are noted. Mild degenerative changes of the knee joint are seen as well. IMPRESSION: No acute abnormality noted. Electronically Signed   By: Inez Catalina M.D.   On: 04/06/2020 16:20    ____________________________________________   PROCEDURES  Procedure(s) performed (including Critical Care):  Procedures   ____________________________________________  INITIAL IMPRESSION / ASSESSMENT AND PLAN / ED COURSE  Patient has an avulsion fracture of the right calcaneus and a new right bundle branch block.  She also reports she is urinating a lot.  I will send a UA and a troponin and also consult with orthopedics about the evulsion of the calcaneus.    ----------------------------------------- 8:30 PM on 04/06/2020 -----------------------------------------  Nurse has discovered that the patient's sister was her caregiver but the patient sister is now in [facility] for a while.  This is per the patient herself.  Patient has a calcaneus fracture and the stroke I do not believe she could get around by herself and she has not been able to move herself in the bed.  She will need more care than she can get at home currently.  Additionally she has a UTI and is somewhat elevated troponin at 85.  She has new right bundle branch on EKG.  There are what may be flipped T waves as well although because of the waviness of the baseline is very hard to tell.          ____________________________________________   FINAL CLINICAL IMPRESSION(S) / ED DIAGNOSES  Final diagnoses:  Fall, initial encounter  Elevated troponin  Weak  Urinary tract infection with hematuria,  site unspecified  Closed displaced avulsion fracture of tuberosity of right calcaneus, initial encounter     ED Discharge Orders    None      *Please note:  Sandra Massey was evaluated in Emergency Department on 04/06/2020 for the symptoms described in the history of present illness. She was evaluated in the context of the global COVID-19 pandemic, which necessitated consideration that the patient might be at risk for infection with the SARS-CoV-2 virus that causes COVID-19. Institutional protocols and algorithms that pertain to the evaluation of patients at risk for COVID-19 are in a state of rapid change based on information released by regulatory bodies including the CDC and federal and state organizations. These policies and algorithms were followed during the patient's care in the ED.  Some ED evaluations and interventions may be delayed as a result of limited staffing during and the pandemic.*   Note:  This document was prepared using Dragon voice recognition software and may include unintentional dictation errors.    Nena Polio, MD 04/06/20 2033

## 2020-04-06 NOTE — ED Triage Notes (Signed)
Pt here from home with a mechanical fall. CBG is 200 with ems. Hx of stroke with right side deficits and DM. Pt denies dizziness before fall. Pt fell twice today with pain on her right side.

## 2020-04-06 NOTE — ED Provider Notes (Signed)
MSE was initiated and I personally evaluated the patient and placed orders (if any) at  3:08 PM on April 06, 2020.   Fall x 2 at home, hx cva with right side paralysis, pain at right shoulder, right femur, right tibia,   Imaging and labs ordered  The patient appears stable so that the remainder of the MSE may be completed by another provider.   Versie Starks, PA-C 04/06/20 1509    Merlyn Lot, MD 04/06/20 1515

## 2020-04-07 ENCOUNTER — Observation Stay: Payer: Medicare HMO

## 2020-04-07 ENCOUNTER — Observation Stay (HOSPITAL_COMMUNITY)
Admit: 2020-04-07 | Discharge: 2020-04-07 | Disposition: A | Discharge status: Home / Self Care | Payer: Medicare HMO | Attending: Internal Medicine | Admitting: Internal Medicine

## 2020-04-07 ENCOUNTER — Observation Stay: Admit: 2020-04-07 | Payer: Medicare HMO

## 2020-04-07 DIAGNOSIS — E1169 Type 2 diabetes mellitus with other specified complication: Secondary | ICD-10-CM | POA: Diagnosis not present

## 2020-04-07 DIAGNOSIS — B9620 Unspecified Escherichia coli [E. coli] as the cause of diseases classified elsewhere: Secondary | ICD-10-CM | POA: Diagnosis present

## 2020-04-07 DIAGNOSIS — I361 Nonrheumatic tricuspid (valve) insufficiency: Secondary | ICD-10-CM | POA: Diagnosis not present

## 2020-04-07 DIAGNOSIS — Z91040 Latex allergy status: Secondary | ICD-10-CM | POA: Diagnosis not present

## 2020-04-07 DIAGNOSIS — I340 Nonrheumatic mitral (valve) insufficiency: Secondary | ICD-10-CM

## 2020-04-07 DIAGNOSIS — Z7901 Long term (current) use of anticoagulants: Secondary | ICD-10-CM | POA: Diagnosis not present

## 2020-04-07 DIAGNOSIS — Z79899 Other long term (current) drug therapy: Secondary | ICD-10-CM | POA: Diagnosis not present

## 2020-04-07 DIAGNOSIS — F32A Depression, unspecified: Secondary | ICD-10-CM | POA: Diagnosis present

## 2020-04-07 DIAGNOSIS — E1165 Type 2 diabetes mellitus with hyperglycemia: Secondary | ICD-10-CM | POA: Diagnosis present

## 2020-04-07 DIAGNOSIS — E1122 Type 2 diabetes mellitus with diabetic chronic kidney disease: Secondary | ICD-10-CM | POA: Diagnosis present

## 2020-04-07 DIAGNOSIS — S92009A Unspecified fracture of unspecified calcaneus, initial encounter for closed fracture: Secondary | ICD-10-CM | POA: Diagnosis present

## 2020-04-07 DIAGNOSIS — Z20822 Contact with and (suspected) exposure to covid-19: Secondary | ICD-10-CM | POA: Diagnosis present

## 2020-04-07 DIAGNOSIS — N390 Urinary tract infection, site not specified: Secondary | ICD-10-CM | POA: Diagnosis present

## 2020-04-07 DIAGNOSIS — W1830XA Fall on same level, unspecified, initial encounter: Secondary | ICD-10-CM | POA: Diagnosis present

## 2020-04-07 DIAGNOSIS — Z794 Long term (current) use of insulin: Secondary | ICD-10-CM | POA: Diagnosis not present

## 2020-04-07 DIAGNOSIS — E1142 Type 2 diabetes mellitus with diabetic polyneuropathy: Secondary | ICD-10-CM | POA: Diagnosis present

## 2020-04-07 DIAGNOSIS — I129 Hypertensive chronic kidney disease with stage 1 through stage 4 chronic kidney disease, or unspecified chronic kidney disease: Secondary | ICD-10-CM | POA: Diagnosis present

## 2020-04-07 DIAGNOSIS — R531 Weakness: Secondary | ICD-10-CM | POA: Diagnosis present

## 2020-04-07 DIAGNOSIS — S92031A Displaced avulsion fracture of tuberosity of right calcaneus, initial encounter for closed fracture: Secondary | ICD-10-CM | POA: Diagnosis present

## 2020-04-07 DIAGNOSIS — Z66 Do not resuscitate: Secondary | ICD-10-CM | POA: Diagnosis present

## 2020-04-07 DIAGNOSIS — I482 Chronic atrial fibrillation, unspecified: Secondary | ICD-10-CM | POA: Diagnosis present

## 2020-04-07 DIAGNOSIS — I248 Other forms of acute ischemic heart disease: Secondary | ICD-10-CM | POA: Diagnosis present

## 2020-04-07 DIAGNOSIS — Z6836 Body mass index (BMI) 36.0-36.9, adult: Secondary | ICD-10-CM | POA: Diagnosis not present

## 2020-04-07 DIAGNOSIS — M65861 Other synovitis and tenosynovitis, right lower leg: Secondary | ICD-10-CM | POA: Diagnosis present

## 2020-04-07 DIAGNOSIS — I69351 Hemiplegia and hemiparesis following cerebral infarction affecting right dominant side: Secondary | ICD-10-CM | POA: Diagnosis not present

## 2020-04-07 DIAGNOSIS — I10 Essential (primary) hypertension: Secondary | ICD-10-CM | POA: Diagnosis not present

## 2020-04-07 DIAGNOSIS — N1831 Chronic kidney disease, stage 3a: Secondary | ICD-10-CM | POA: Diagnosis present

## 2020-04-07 LAB — ECHOCARDIOGRAM COMPLETE
AR max vel: 2.08 cm2
AV Area VTI: 2.15 cm2
AV Area mean vel: 2.17 cm2
AV Mean grad: 4 mmHg
AV Peak grad: 6.1 mmHg
Ao pk vel: 1.24 m/s
Area-P 1/2: 4.04 cm2
Height: 66 in
S' Lateral: 3.89 cm
Weight: 3600 oz

## 2020-04-07 LAB — BASIC METABOLIC PANEL
Anion gap: 13 (ref 5–15)
BUN: 25 mg/dL — ABNORMAL HIGH (ref 8–23)
CO2: 27 mmol/L (ref 22–32)
Calcium: 9.7 mg/dL (ref 8.9–10.3)
Chloride: 98 mmol/L (ref 98–111)
Creatinine, Ser: 1.15 mg/dL — ABNORMAL HIGH (ref 0.44–1.00)
GFR, Estimated: 52 mL/min — ABNORMAL LOW (ref >60)
Glucose, Bld: 351 mg/dL — ABNORMAL HIGH (ref 70–99)
Potassium: 4.4 mmol/L (ref 3.5–5.1)
Sodium: 138 mmol/L (ref 135–145)

## 2020-04-07 LAB — CBC
HCT: 39.8 % (ref 36.0–46.0)
Hemoglobin: 12.5 g/dL (ref 12.0–15.0)
MCH: 28.5 pg (ref 26.0–34.0)
MCHC: 31.4 g/dL (ref 30.0–36.0)
MCV: 90.7 fL (ref 80.0–100.0)
Platelets: 199 10*3/uL (ref 150–400)
RBC: 4.39 MIL/uL (ref 3.87–5.11)
RDW: 13.8 % (ref 11.5–15.5)
WBC: 9 10*3/uL (ref 4.0–10.5)
nRBC: 0 % (ref 0.0–0.2)

## 2020-04-07 LAB — GLUCOSE, CAPILLARY
Glucose-Capillary: 256 mg/dL — ABNORMAL HIGH (ref 70–99)
Glucose-Capillary: 267 mg/dL — ABNORMAL HIGH (ref 70–99)
Glucose-Capillary: 284 mg/dL — ABNORMAL HIGH (ref 70–99)
Glucose-Capillary: 301 mg/dL — ABNORMAL HIGH (ref 70–99)
Glucose-Capillary: 345 mg/dL — ABNORMAL HIGH (ref 70–99)

## 2020-04-07 LAB — TROPONIN I (HIGH SENSITIVITY): Troponin I (High Sensitivity): 122 ng/L (ref <18)

## 2020-04-07 LAB — MRSA PCR SCREENING: MRSA by PCR: NEGATIVE

## 2020-04-07 LAB — HEMOGLOBIN A1C
Hgb A1c MFr Bld: 11.3 % — ABNORMAL HIGH (ref 4.8–5.6)
Mean Plasma Glucose: 277.61 mg/dL

## 2020-04-07 LAB — SARS CORONAVIRUS 2 (TAT 6-24 HRS): SARS Coronavirus 2: NEGATIVE

## 2020-04-07 MED ORDER — AMIODARONE HCL 200 MG PO TABS
200.0000 mg | ORAL_TABLET | Freq: Every day | ORAL | Status: DC
  Administered 2020-04-08 – 2020-04-13 (×6): 200 mg via ORAL
  Filled 2020-04-07 (×6): qty 1

## 2020-04-07 MED ORDER — FUROSEMIDE 20 MG PO TABS
20.0000 mg | ORAL_TABLET | Freq: Every day | ORAL | Status: DC
  Administered 2020-04-08 – 2020-04-13 (×6): 20 mg via ORAL
  Filled 2020-04-07 (×6): qty 1

## 2020-04-07 MED ORDER — ALPRAZOLAM 0.25 MG PO TABS
0.2500 mg | ORAL_TABLET | Freq: Once | ORAL | Status: AC
  Administered 2020-04-07: 0.25 mg via ORAL
  Filled 2020-04-07: qty 1

## 2020-04-07 MED ORDER — SPIRONOLACTONE 25 MG PO TABS
25.0000 mg | ORAL_TABLET | Freq: Every day | ORAL | Status: DC
  Administered 2020-04-07 – 2020-04-09 (×3): 25 mg via ORAL
  Filled 2020-04-07 (×3): qty 1

## 2020-04-07 MED ORDER — SODIUM CHLORIDE 0.9 % IV SOLN
2.0000 g | INTRAVENOUS | Status: DC
  Administered 2020-04-07 – 2020-04-10 (×4): 2 g via INTRAVENOUS
  Filled 2020-04-07: qty 2
  Filled 2020-04-07 (×3): qty 20
  Filled 2020-04-07: qty 2

## 2020-04-07 NOTE — Progress Notes (Addendum)
PROGRESS NOTE    Sandra Massey  [id number] DOB: 12/23/1950 DOA: 04/06/2020 PCP: [name], MD    Assessment & Plan:   Principal Problem:   Closed right calcaneal fracture Active Problems:   Chronic a-fib ([place])   Weakness   Essential hypertension   Closed right calcaneal fracture: continue w/ non-weight bearing of right foot. Continue w/ compressive sleeve. MRI right foot shows diffuse edema w/in 3rd proximal phalanx w/ cortical discontinuity in the medial proximal aspect which is consistent w/ fracture. Mild tenosynovitis. MRI right ankle ordered as per podiatry. No surgery today as per podiatry   Elevated troponins: minimally elevated, likely secondary to demand ischemia. Echo ordered   UTI: urine cx is pending. Continue on IV ceftriaxone   Sepsis: r/o. Non-toxic appearing   DM2: poorly controlled w/ HbA1c 11.3. Continue on lantus, SSI w/ accuchecks   Chronic a. Fib: continue on amiodarone. Hold eliquis for possible surgery today   Hx of CVA: with known right hemiparesis. Will f/u outpatient w/ neuro  Depression: severity unknown. Continue on home dose of sertraline.   Morbid Obesity: BMI 36.3. Complicates overall care & prognosis    DVT prophylaxis: heparin  Code Status: full  Family Communication: called pt's sister, [name], no answer so I left a message  Disposition Plan:  Unclear, depends on PT/OT recs   Level of care: Med-Surg   Status is: Observation  The patient remains OBS appropriate and will d/c before 2 midnights.  Dispo: The patient is from: Home              Anticipated d/c is to: SNF              Anticipated d/c date is: 3 days              Patient currently is not medically stable to d/c.   Difficult to place patient Yes    Consultants:   Podiatry   Procedures:    Antimicrobials: ceftriaxone    Subjective: Pt c/o right foot pain   Objective: Vitals:   04/06/20 1800 04/06/20 2117 04/06/20 2217 04/07/20 0331  BP: (!)  142/99 132/68 138/71 (!) 142/80  Pulse: 68 68 70 89  Resp:  '16 18 18  '$ Temp:   (!) 97.4 F (36.3 C) 98 F (36.7 C)  TempSrc:   Oral   SpO2: 97% 93% 95% 93%  Weight:      Height:       No intake or output data in the 24 hours ending 04/07/20 0735 Filed Weights   04/06/20 1501  Weight: 102.1 kg    Examination:  General exam: Appears calm abut uncomfortable. Difficulty talking  Respiratory system: Clear to auscultation. Respiratory effort normal. Cardiovascular system: S1 & S2 +. No rubs, gallops or clicks.  Gastrointestinal system: Abdomen is obese, soft and nontender. Normal bowel sounds heard. Central nervous system: Alert and awake.  Psychiatry: Judgement and insight appear normal. Appears frustrated     Data Reviewed: I have personally reviewed following labs and imaging studies  CBC: Recent Labs  Lab 04/06/20 1503 04/07/20 0047  WBC 12.8* 9.0  HGB 13.1 12.5  HCT 40.8 39.8  MCV 89.7 90.7  PLT 224 [id number]   Basic Metabolic Panel: Recent Labs  Lab 04/06/20 1503 04/07/20 0047  NA 138 138  K 4.6 4.4  CL 100 98  CO2 29 27  GLUCOSE 205* 351*  BUN 28* 25*  CREATININE 1.07* 1.15*  CALCIUM 9.7 9.7   GFR: Estimated Creatinine  Clearance: 56.5 mL/min (A) (by C-G formula based on SCr of 1.15 mg/dL (H)). Liver Function Tests: Recent Labs  Lab 04/06/20 1503  AST 30  ALT 32  ALKPHOS 100  BILITOT 1.0  PROT 8.0  ALBUMIN 3.7   No results for input(s): LIPASE, AMYLASE in the last 168 hours. No results for input(s): AMMONIA in the last 168 hours. Coagulation Profile: Recent Labs  Lab 04/06/20 2203  INR 1.3*   Cardiac Enzymes: No results for input(s): CKTOTAL, CKMB, CKMBINDEX, TROPONINI in the last 168 hours. BNP (last 3 results) No results for input(s): PROBNP in the last 8760 hours. HbA1C: Recent Labs    04/06/20 2203  HGBA1C 11.3*   CBG: Recent Labs  Lab 04/06/20 1505 04/07/20 0041  GLUCAP 185* 301*   Lipid Profile: No results for input(s):  CHOL, HDL, LDLCALC, TRIG, CHOLHDL, LDLDIRECT in the last 72 hours. Thyroid Function Tests: Recent Labs    04/06/20 2203  TSH 3.578   Anemia Panel: No results for input(s): VITAMINB12, FOLATE, FERRITIN, TIBC, IRON, RETICCTPCT in the last 72 hours. Sepsis Labs: No results for input(s): PROCALCITON, LATICACIDVEN in the last 168 hours.  Recent Results (from the past 240 hour(s))  SARS CORONAVIRUS 2 (TAT 6-24 HRS) Nasopharyngeal Nasopharyngeal Swab     Status: None   Collection Time: 04/06/20  8:28 PM   Specimen: Nasopharyngeal Swab  Result Value Ref Range Status   SARS Coronavirus 2 NEGATIVE NEGATIVE Final    Comment: (NOTE) SARS-CoV-2 target nucleic acids are NOT DETECTED.  The SARS-CoV-2 RNA is generally detectable in upper and lower respiratory specimens during the acute phase of infection. Negative results do not preclude SARS-CoV-2 infection, do not rule out co-infections with other pathogens, and should not be used as the sole basis for treatment or other patient management decisions. Negative results must be combined with clinical observations, patient history, and epidemiological information. The expected result is Negative.  Fact Sheet for Patients: SugarRoll.be  Fact Sheet for Healthcare Providers: https://www.woods-mathews.com/  This test is not yet approved or cleared by the [place] FDA and  has been authorized for detection and/or diagnosis of SARS-CoV-2 by FDA under an Emergency Use Authorization (EUA). This EUA will remain  in effect (meaning this test can be used) for the duration of the COVID-19 declaration under Se ction 564(b)(1) of the Act, 21 U.S.C. section 360bbb-3(b)(1), unless the authorization is terminated or revoked sooner.  Performed at [facility], [place] [address] [place], [place] [address]          Radiology Studies: DG Chest 2 View  Result Date: 04/06/2020 CLINICAL DATA:  Status  post fall. EXAM: CHEST - 2 VIEW COMPARISON:  Chest radiograph May 31, 2019. FINDINGS: Stable enlarged cardiac and mediastinal contours. Aortic atherosclerosis. Bilateral mid lower lung patchy airspace opacities. No pleural effusion or pneumothorax. Thoracic spine degenerative changes. IMPRESSION: Bilateral mid and lower lung patchy opacities which may represent atelectasis or infection. Electronically Signed   By: Lovey Newcomer M.D.   On: 04/06/2020 18:59   DG Shoulder Right  Result Date: 04/06/2020 CLINICAL DATA:  Recent fall with right shoulder pain, initial encounter EXAM: RIGHT SHOULDER - 2+ VIEW COMPARISON:  None. FINDINGS: Degenerative changes of the acromioclavicular and glenohumeral joints are seen. The humeral head is high-riding likely related to chronic rotator cuff injury. The underlying bony thorax appears within normal limits. No other focal abnormality is seen. IMPRESSION: Degenerative change without acute abnormality. Electronically Signed   By: Linus Mako.D.  On: 04/06/2020 16:22   DG Tibia/Fibula Right  Result Date: 04/06/2020 CLINICAL DATA:  Recent fall with right leg pain, initial encounter EXAM: RIGHT TIBIA AND FIBULA - 2 VIEW COMPARISON:  None. FINDINGS: Tibia and fibula are within normal limits. There is an avulsion from the superior aspect of the calcaneus at the level of the Achilles insertion. No other focal abnormality is seen. IMPRESSION: Avulsion from the superior aspect of the calcaneus. Tibia and fibula are within normal limits. Electronically Signed   By: Inez Catalina M.D.   On: 04/06/2020 16:22   CT Head Wo Contrast  Result Date: 04/06/2020 CLINICAL DATA:  Patient status post fall. EXAM: CT HEAD WITHOUT CONTRAST TECHNIQUE: Contiguous axial images were obtained from the base of the skull through the vertex without intravenous contrast. COMPARISON:  Brain CT 03/17/2019. FINDINGS: Brain: Ventricles and sulci are appropriate for patient's age. Redemonstrated  encephalomalacia from chronic left MCA territory infarct. No evidence for acute cortically based infarct, intracranial hemorrhage, mass lesion or mass-effect. Vascular: Unremarkable. Skull: Intact. Sinuses/Orbits: Paranasal sinuses are well aerated. Mastoid air cells are unremarkable. Orbits are unremarkable. Other: None. IMPRESSION: 1. No acute intracranial process. 2. Old left MCA territory infarct. Electronically Signed   By: Lovey Newcomer M.D.   On: 04/06/2020 16:00   DG Pelvis Portable  Result Date: 04/06/2020 CLINICAL DATA:  [place], right-sided pain EXAM: PORTABLE PELVIS 1-2 VIEWS COMPARISON:  04/06/2020 FINDINGS: Supine frontal view of the pelvis demonstrates no acute displaced fractures. Enthesopathic changes are seen at the greater trochanters bilaterally, right greater than left. Symmetrical bilateral hip osteoarthritis. No subluxation or dislocation. There is mild sclerosis of the left SI joint. No erosive changes. Prominent degenerative changes at the lumbosacral junction. IMPRESSION: 1. No acute displaced fracture. If hip fracture remains a concern, follow-up CT or MRI could be considered. 2. Degenerative changes as above. Electronically Signed   By: Randa Ngo M.D.   On: 04/06/2020 17:02   MR FOOT RIGHT WO CONTRAST  Result Date: 04/07/2020 CLINICAL DATA:  [place], right foot pain EXAM: MRI OF THE RIGHT FOREFOOT WITHOUT CONTRAST TECHNIQUE: Multiplanar, multisequence MR imaging of the right forefoot was performed. No intravenous contrast was administered. COMPARISON:  None. FINDINGS: Bones/Joint/Cartilage Evaluation is limited due to patient pain. Unable to utilize dedicated foot coil, limiting resolution of the exam. There is diffuse edema within the third proximal phalanx. Cortical discontinuity within the proximal medial aspect of the third proximal phalanx could reflect fracture or osteomyelitis. There is mild marrow edema within the heads of the second third metatarsals, without evidence of  cortical disruption. Remaining bony structures demonstrate normal signal characteristics. Ligaments No gross abnormality on this limited exam. Muscles and Tendons Small amount of fluid within the flexor tendon sheaths consistent with tenosynovitis. Otherwise no gross abnormalities on this limited exam. Soft tissues Mild subcutaneous edema within the plantar aspect of the forefoot at the level of the second and third metatarsal heads. No fluid collection. No evidence of soft tissue defect. IMPRESSION: 1. Diffuse edema within the third proximal phalanx, with cortical discontinuity in the medial proximal aspect. Based on history, this is most consistent with fracture. 2. Mild edema within the heads of the second and third metatarsals, without clear cortical discontinuity. This could reflect contusion in the setting of trauma. 3. Mild soft tissue edema plantar aspect of the forefoot. No fluid collection or hematoma. 4. Fluid within the flexor tendon sheaths consistent with mild tenosynovitis. Electronically Signed   By: Randa Ngo M.D.   On:  04/07/2020 00:02   DG Femur Min 2 Views Right  Result Date: 04/06/2020 CLINICAL DATA:  Recent fall with right hip pain, initial encounter EXAM: RIGHT FEMUR 2 VIEWS COMPARISON:  None. FINDINGS: Degenerative changes of the right hip joint are noted. No acute fracture or dislocation is seen. No soft tissue abnormalities are noted. Mild degenerative changes of the knee joint are seen as well. IMPRESSION: No acute abnormality noted. Electronically Signed   By: Inez Catalina M.D.   On: 04/06/2020 16:20        Scheduled Meds: . amiodarone  200 mg Oral BID  . heparin  5,000 Units Subcutaneous Q8H  . insulin aspart  0-20 Units Subcutaneous TID WC  . insulin aspart  0-5 Units Subcutaneous QHS  . insulin glargine  32 Units Subcutaneous Daily  . rosuvastatin  40 mg Oral Daily  . sertraline  50 mg Oral Daily   Continuous Infusions: . ceFEPime (MAXIPIME) IV 2 g (04/07/20  0546)  . [START ON 04/08/2020] vancomycin       LOS: 0 days    Time spent: 33 mins     Wyvonnia Dusky, MD [facility] Pager [phone]  If 7PM-7AM, please contact night-coverage 04/07/2020, 7:35 AM

## 2020-04-07 NOTE — Progress Notes (Addendum)
Inpatient Diabetes Program Recommendations  AACE/ADA: New Consensus Statement on Inpatient Glycemic Control (2015)  Target Ranges:  Prepandial:   less than 140 mg/dL      [facility] postprandial:   less than 180 mg/dL (1-2 hours)      Critically ill patients:  140 - 180 mg/dL   Results for HELI, DINO (MRN [id number]) as of 04/07/2020 07:38  Ref. Range 04/06/2020 15:05 04/07/2020 00:41  Glucose-Capillary Latest Ref Range: 70 - 99 mg/dL 185 (H) 301 (H)  4 units NOVOLOG    Results for LANAYSIA, FRITCHMAN (MRN [id number]) as of 04/07/2020 07:38  Ref. Range 08/21/2018 10:29 05/31/2019 19:07 04/06/2020 22:03  Hemoglobin A1C Latest Ref Range: 4.8 - 5.6 % 11.3 (H) 11.4 (H) 11.3 (H)  (277 mg/dl)    Admit with: Closed right calcaneal fracture after Fall/ UTI/ Sepsis  History: DM, CVA  Home DM Meds: Novolog 7 units TID with meals       Lantus 32 units Daily  Current Orders: Lantus 32 units Daily      Novolog Resistant Correction Scale/ SSI (0-20 units) TID AC + HS      Note Lantus and Novolog SSI to both start this AM  Current A1c of 11.3% shows continued poor glucose control at home--needs follow up with PCP (Dr. Rosario Jacks)  Agree with current Insulin orders    Addendum 11am--Met w/ pt at bedside this AM.  Conversation limited by aphasia & emotional lability during our time together.  I was unable to ascertain if pt is consistently giving herself her insulin at home.    Per Chart review, it looks as if pt was new to insulin back in March 2021 due to her A1c being 11.4% during that admission.  Per note review, the Diabetes Coordinator spoke w/ pt's sister Myra on 06/07/2019 who would be helping pt with her insulin at home (sister was very familiar with insulin as sister uses insulin at home herself).  Attempted to reach out to family listed in Demographics page without success.  Per notes, it looks as if pt will go to SNF for Rehab after d/c.    --Will follow patient during  hospitalization--  Wyn Quaker RN, MSN, CDE Diabetes Coordinator Inpatient Glycemic Control Team Team Pager: [phone] (8a-5p)

## 2020-04-07 NOTE — Evaluation (Addendum)
Physical Therapy Evaluation Patient Details Name: Sandra Massey MRN: [id number] DOB: 26-Sep-1950 Today's Date: 04/07/2020   History of Present Illness  Pt is a 70 y/o F admitted on 04/06/20 with c/c of R sided leg pain after a fall. Pt found to have a R calcaneal fx. PMH: L MC infarct with R hemiparesis, chronic a-fib, IDDM, HTN, obesity  Clinical Impression  Pt seen for PT evaluation on this date with PT educating pt on NWB RLE. Pt attempts to provide some PLOF & home set up information but is limited by aphasia & emotional lability throughout session with PT providing encouragement throughout. Pt is able to initiate supine<>sit but ultimately requires max assist +2 to complete movement. Pt is able to perform RLE LAQ seated EOB but demonstrates impaired awareness as pt asking to walk despite education re: NWB & pt requiring +2 for bed mobility. Pt would benefit from STR upon d/c to maximize independence and reduce fall risk prior to return home. Will continue to follow pt acutely to progress bed mobility & transfers as able.   PT spoke with pt's sister in law [name]) to confirm pt's PLOF & home set up; [name] reports pt's emotional lability is her baseline since the stroke.   Addendum: pt with elevated glucose levels this AM but nurse reports pt has received insulin.     Follow Up Recommendations SNF    Equipment Recommendations  None recommended by PT    Recommendations for Other Services       Precautions / Restrictions Precautions Precautions: Fall Precaution Comments: R hemi (UE>LE) Restrictions Weight Bearing Restrictions: Yes RLE Weight Bearing: Non weight bearing      Mobility  Bed Mobility Overal bed mobility: Needs Assistance Bed Mobility: Supine to Sit;Rolling;Sit to Supine Rolling: Max assist   Supine to sit: Max assist;+2 for physical assistance;HOB elevated Sit to supine: +2 for physical assistance;HOB elevated;Max assist   General bed mobility comments: cuing for  use of bed rails to assist with bed mobility but pt with limited use of them, pt is able to initate bringing LLE to EOB for supine>sit, and able to initate lying trunk/shoulders down with sit>supine    Transfers                    Ambulation/Gait                Stairs            Wheelchair Mobility    Modified Rankin (Stroke Patients Only)       Balance Overall balance assessment: Needs assistance Sitting-balance support: Feet unsupported;Single extremity supported Sitting balance-Leahy Scale: Poor Sitting balance - Comments: CGA/min assist static sitting balance, min assist dynamic sitting balance 2/2 posterior lean when performing RLE LAQ sitting EOB                                     Pertinent Vitals/Pain Pain Assessment: Faces Faces Pain Scale: Hurts whole lot Pain Location: RLE Pain Descriptors / Indicators: Cramping;Moaning;Grimacing Pain Intervention(s): Monitored during session;Repositioned;Patient requesting pain meds-RN notified    Home Living Family/patient expects to be discharged to:: Private residence Living Arrangements: Other relatives (sister) Available Help at Discharge: Family Type of Home: House Home Access: Ramped entrance     Home Layout: Two level;Able to live on main level with bedroom/bathroom Home Equipment: Kasandra Knudsen - single point;Cane - quad Additional Comments: Pt lives with  sister but sister is currently in [facility]. Pt ambulates household distances with QC. Pt has aid 7 days/week 4-5 hours that drives her to run errands & assists with meals.    Prior Function Level of Independence: Independent with assistive device(s)               Hand Dominance   Dominant Hand: Left    Extremity/Trunk Assessment   Upper Extremity Assessment Upper Extremity Assessment: RUE deficits/detail RUE Deficits / Details: no active movement noted in RUE, pt with hemiparesis following stroke    Lower Extremity  Assessment Lower Extremity Assessment: Generalized weakness;RLE deficits/detail RLE Deficits / Details: 2/5 R knee extension in sitting, limited by pain       Communication   Communication: Expressive difficulties (hx of aphasia following stroke, pt very labile throughout session)  Cognition Arousal/Alertness: Awake/alert Behavior During Therapy:  (very labile, crying throughout session) Overall Cognitive Status: Difficult to assess                                 General Comments: Pt with impaired awareness as once sitting EOB pt asking to walk despite PT educating her on NWB RLE and pt requiring +2 for bed mobility.      General Comments      Exercises General Exercises - Lower Extremity Long Arc Quad: AROM;Right;10 reps;Seated (2 sets x 10 reps RLE, 1 set x 10 reps LLE)   Assessment/Plan    PT Assessment Patient needs continued PT services  PT Problem List Decreased strength;Decreased mobility;Decreased safety awareness;Decreased knowledge of precautions;Obesity;Decreased activity tolerance;Cardiopulmonary status limiting activity;Pain;Decreased knowledge of use of DME;Decreased balance       PT Treatment Interventions DME instruction;Therapeutic activities;Cognitive remediation;Modalities;Gait training;Therapeutic exercise;Patient/family education;Wheelchair mobility training;Balance training;Functional mobility training;Neuromuscular re-education;Manual techniques    PT Goals (Current goals can be found in the Care Plan section)  Acute Rehab PT Goals Patient Stated Goal: pt unable to state PT Goal Formulation: Patient unable to participate in goal setting Additional Goals Additional Goal #1: Pt will propel w/c 100 ft with Mod I.    Frequency 7X/week   Barriers to discharge Decreased caregiver support;Inaccessible home environment      Co-evaluation               AM-PAC PT "6 Clicks" Mobility  Outcome Measure Help needed turning from your back  to your side while in a flat bed without using bedrails?: Total Help needed moving from lying on your back to sitting on the side of a flat bed without using bedrails?: Total Help needed moving to and from a bed to a chair (including a wheelchair)?: Total Help needed standing up from a chair using your arms (e.g., wheelchair or bedside chair)?: Total Help needed to walk in hospital room?: Total Help needed climbing 3-5 steps with a railing? : Total 6 Click Score: 6    End of Session   Activity Tolerance: Patient limited by pain Patient left: in bed;with call bell/phone within reach;with bed alarm set;with nursing/sitter in room Nurse Communication: Mobility status;Weight bearing status PT Visit Diagnosis: Muscle weakness (generalized) (M62.81);Difficulty in walking, not elsewhere classified (R26.2);Pain Pain - Right/Left: Right Pain - part of body: Ankle and joints of foot    Time: 1136-1203 PT Time Calculation (min) (ACUTE ONLY): 27 min   Charges:   PT Evaluation $PT Eval Moderate Complexity: 1 Mod PT Treatments $Therapeutic Activity: 8-22 mins  Lavone Nian, PT, DPT 04/07/20, 2:16 PM   Waunita Schooner 04/07/2020, 1:09 PM

## 2020-04-07 NOTE — Consult Note (Signed)
PODIATRY / FOOT AND ANKLE SURGERY CONSULTATION NOTE  Requesting Physician: Eppie Gibson, MD  Reason for consult: Right calcaneal avulsion fracture  Chief Complaint: Right heel pain   HPI: Sandra Massey is a 70 y.o. female who presented to [facility] due to fall.  Patient has a past medical history concerning for uncontrolled diabetes type 2, substance abuse, hypertension, chronic A. fib, history of stroke with left MCA territory infarct and right-sided hemiparesis, obesity.  Patient fell and began having pain in multiple areas in her body.  Patient was evaluated in the emergency room and treated further.  Podiatry team was consulted due to x-ray finding of avulsion fracture of the calcaneus.  Patient was also noted to have a UTI upon admission.  PMHx:  Past Medical History:  Diagnosis Date  . Arthritis   . Diabetes mellitus without complication ([place])   . Hypertension   . Substance abuse [facility])     Surgical Hx:  Past Surgical History:  Procedure Laterality Date  . BREAST BIOPSY Right    sebaceous cyst removed    FHx: No family history on file.  Social History:  reports that she has never smoked. She has never used smokeless tobacco. She reports that she does not drink alcohol and does not use drugs.  Allergies:  Allergies  Allergen Reactions  . Latex Rash    Review of Systems: General ROS: negative Respiratory ROS: no cough, shortness of breath, or wheezing Cardiovascular ROS: no chest pain or dyspnea on exertion Gastrointestinal ROS: no abdominal pain, change in bowel habits, or black or bloody stools Musculoskeletal ROS: positive for - joint pain, joint stiffness and joint swelling Neurological ROS: positive for - numbness/tingling Dermatological ROS: negative  Medications Prior to Admission  Medication Sig Dispense Refill  . amiodarone (PACERONE) 200 MG tablet Take 1 tablet (200 mg total) by mouth 2 (two) times daily. (Patient taking  differently: Take 200 mg by mouth daily.) 60 tablet 0  . Ascorbic Acid (VITA-C PO) Take 1 tablet by mouth daily.    Marland Kitchen ELIQUIS 5 MG TABS tablet Take 5 mg by mouth 2 (two) times daily.    . furosemide (LASIX) 20 MG tablet Take 20 mg by mouth daily.    . insulin aspart (NOVOLOG) 100 UNIT/ML FlexPen Inject 7 Units into the skin 3 (three) times daily with meals. (Patient taking differently: Inject into the skin See admin instructions. Per sliding scale up to 25 units daily) 1 pen 0  . insulin glargine (LANTUS) 100 UNIT/ML Solostar Pen Inject 32 Units into the skin daily. (Patient taking differently: Inject 35 Units into the skin daily.) 1 pen 0  . lisinopril-hydrochlorothiazide (ZESTORETIC) 20-25 MG tablet Take 1 tablet by mouth daily.    . Multiple Vitamin (MULTIVITAMIN WITH MINERALS) TABS tablet Take 1 tablet by mouth daily.    . rosuvastatin (CRESTOR) 40 MG tablet Take 40 mg by mouth daily.    . sertraline (ZOLOFT) 50 MG tablet Take 50 mg by mouth daily.    Marland Kitchen spironolactone (ALDACTONE) 25 MG tablet Take 25 mg by mouth daily.    . Insulin Pen Needle 31G X 8 MM MISC 1 needle with each injection of insulin 100 each 0    Physical Exam: General: Alert and oriented.  No apparent distress.  Vascular: DP/PT pulses faintly palpable bilateral.  Moderate edema present to bilateral lower extremities.  Moderate edema present to the posterior aspect of the right heel with corresponding ecchymosis.  Neuro: Light touch sensation  reduced to digits bilateral.  Derm: Right posterior heel skin appears to be intact with capillary fill time intact.  No evidence of necrosis at this time.  Skin does appear to be thin and atrophic to both feet though overall.  MSK: Pain on palpation to the right posterior heel.  Deferred range of motion examination due to posttraumatic state.  No obvious deformity noted to the posterior aspect of the right heel.  Results for orders placed or performed during the hospital encounter of  04/06/20 (from the past 48 hour(s))  CBC     Status: Abnormal   Collection Time: 04/06/20  3:03 PM  Result Value Ref Range   WBC 12.8 (H) 4.0 - 10.5 K/uL   RBC 4.55 3.87 - 5.11 MIL/uL   Hemoglobin 13.1 12.0 - 15.0 g/dL   HCT 40.8 36.0 - 46.0 %   MCV 89.7 80.0 - 100.0 fL   MCH 28.8 26.0 - 34.0 pg   MCHC 32.1 30.0 - 36.0 g/dL   RDW 13.9 11.5 - 15.5 %   Platelets 224 150 - 400 K/uL   nRBC 0.0 0.0 - 0.2 %    Comment: Performed at [facility], [address] [place], [place] [address]  Comprehensive metabolic panel     Status: Abnormal   Collection Time: 04/06/20  3:03 PM  Result Value Ref Range   Sodium 138 135 - 145 mmol/L   Potassium 4.6 3.5 - 5.1 mmol/L   Chloride 100 98 - 111 mmol/L   CO2 29 22 - 32 mmol/L   Glucose, Bld 205 (H) 70 - 99 mg/dL    Comment: Glucose reference range applies only to samples taken after fasting for at least 8 hours.   BUN 28 (H) 8 - 23 mg/dL   Creatinine, Ser 1.07 (H) 0.44 - 1.00 mg/dL   Calcium 9.7 8.9 - 10.3 mg/dL   Total Protein 8.0 6.5 - 8.1 g/dL   Albumin 3.7 3.5 - 5.0 g/dL   AST 30 15 - 41 U/L   ALT 32 0 - 44 U/L   Alkaline Phosphatase 100 38 - 126 U/L   Total Bilirubin 1.0 0.3 - 1.2 mg/dL   GFR, Estimated 57 (L) >60 mL/min    Comment: (NOTE) Calculated using the CKD-EPI Creatinine Equation (2021)    Anion gap 9 5 - 15    Comment: Performed at [facility], [place], [place] [address]  Troponin I (High Sensitivity)     Status: Abnormal   Collection Time: 04/06/20  3:03 PM  Result Value Ref Range   Troponin I (High Sensitivity) 85 (H) <18 ng/L    Comment: (NOTE) Elevated high sensitivity troponin I (hsTnI) values and significant  changes across serial measurements may suggest ACS but many other  chronic and acute conditions are known to elevate hsTnI results.  Refer to the "Links" section for chest pain algorithms and additional  guidance. Performed at [facility], [address]  [place], [place] [address]   CBG monitoring, ED     Status: Abnormal   Collection Time: 04/06/20  3:05 PM  Result Value Ref Range   Glucose-Capillary 185 (H) 70 - 99 mg/dL    Comment: Glucose reference range applies only to samples taken after fasting for at least 8 hours.  Urinalysis, Complete w Microscopic Urine, Clean Catch     Status: Abnormal   Collection Time: 04/06/20  7:45 PM  Result Value Ref Range   Color, Urine YELLOW (A)  YELLOW   APPearance HAZY (A) CLEAR   Specific Gravity, Urine 1.017 1.005 - 1.030   pH 7.0 5.0 - 8.0   Glucose, UA NEGATIVE NEGATIVE mg/dL   Hgb urine dipstick MODERATE (A) NEGATIVE   Bilirubin Urine NEGATIVE NEGATIVE   Ketones, ur NEGATIVE NEGATIVE mg/dL   Protein, ur NEGATIVE NEGATIVE mg/dL   Nitrite NEGATIVE NEGATIVE   Leukocytes,Ua TRACE (A) NEGATIVE   RBC / HPF 6-10 0 - 5 RBC/hpf   WBC, UA 11-20 0 - 5 WBC/hpf   Bacteria, UA RARE (A) NONE SEEN   Squamous Epithelial / LPF 0-5 0 - 5   Mucus PRESENT     Comment: Performed at [facility], [place], [place] [address]  Troponin I (High Sensitivity)     Status: Abnormal   Collection Time: 04/06/20  8:24 PM  Result Value Ref Range   Troponin I (High Sensitivity) 106 (HH) <18 ng/L    Comment: CRITICAL RESULT CALLED TO, READ BACK BY AND VERIFIED WITH DEBORAH EMBDEN '@2135'$  04/06/20 RH (NOTE) Elevated high sensitivity troponin I (hsTnI) values and significant  changes across serial measurements may suggest ACS but many other  chronic and acute conditions are known to elevate hsTnI results.  Refer to the "Links" section for chest pain algorithms and additional  guidance. Performed at [facility], [place], [place] [address]   SARS CORONAVIRUS 2 (TAT 6-24 HRS) Nasopharyngeal Nasopharyngeal Swab     Status: None   Collection Time: 04/06/20  8:28 PM   Specimen: Nasopharyngeal Swab  Result Value Ref Range   SARS Coronavirus 2 NEGATIVE NEGATIVE    Comment:  (NOTE) SARS-CoV-2 target nucleic acids are NOT DETECTED.  The SARS-CoV-2 RNA is generally detectable in upper and lower respiratory specimens during the acute phase of infection. Negative results do not preclude SARS-CoV-2 infection, do not rule out co-infections with other pathogens, and should not be used as the sole basis for treatment or other patient management decisions. Negative results must be combined with clinical observations, patient history, and epidemiological information. The expected result is Negative.  Fact Sheet for Patients: SugarRoll.be  Fact Sheet for Healthcare Providers: https://www.woods-mathews.com/  This test is not yet approved or cleared by the [place] FDA and  has been authorized for detection and/or diagnosis of SARS-CoV-2 by FDA under an Emergency Use Authorization (EUA). This EUA will remain  in effect (meaning this test can be used) for the duration of the COVID-19 declaration under Se ction 564(b)(1) of the Act, 21 U.S.C. section 360bbb-3(b)(1), unless the authorization is terminated or revoked sooner.  Performed at [facility], [place] [address], [place], [place] [address]   TSH     Status: None   Collection Time: 04/06/20 10:03 PM  Result Value Ref Range   TSH 3.578 0.350 - 4.500 uIU/mL    Comment: Performed by a 3rd Generation assay with a functional sensitivity of <=0.01 uIU/mL. Performed at [facility], [address] [place], [place] [address]   Hemoglobin A1c     Status: Abnormal   Collection Time: 04/06/20 10:03 PM  Result Value Ref Range   Hgb A1c MFr Bld 11.3 (H) 4.8 - 5.6 %    Comment: (NOTE) Pre diabetes:          5.7%-6.4%  Diabetes:              >6.4%  Glycemic control for   <7.0% adults with diabetes    Mean Plasma  Glucose 277.61 mg/dL    Comment: Performed at [facility], [place] [address], [place], [place] [address]  Protime-INR     Status: Abnormal    Collection Time: 04/06/20 10:03 PM  Result Value Ref Range   Prothrombin Time 15.6 (H) 11.4 - 15.2 seconds   INR 1.3 (H) 0.8 - 1.2    Comment: (NOTE) INR goal varies based on device and disease states. Performed at [facility], [place], [place] [address]   Troponin I (High Sensitivity)     Status: Abnormal   Collection Time: 04/06/20 10:12 PM  Result Value Ref Range   Troponin I (High Sensitivity) 101 (HH) <18 ng/L    Comment: CRITICAL VALUE NOTED. VALUE IS CONSISTENT WITH PREVIOUSLY REPORTED/CALLED VALUE RH (NOTE) Elevated high sensitivity troponin I (hsTnI) values and significant  changes across serial measurements may suggest ACS but many other  chronic and acute conditions are known to elevate hsTnI results.  Refer to the "Links" section for chest pain algorithms and additional  guidance. Performed at [facility], [place], [address] [place], [place] [address]   Brain natriuretic peptide     Status: Abnormal   Collection Time: 04/06/20 10:40 PM  Result Value Ref Range   B Natriuretic Peptide 201.4 (H) 0.0 - 100.0 pg/mL    Comment: Performed at [facility], [address] [place], [place] [address]  CULTURE, BLOOD (ROUTINE X 2) w Reflex to ID Panel     Status: None (Preliminary result)   Collection Time: 04/06/20 10:40 PM   Specimen: BLOOD  Result Value Ref Range   Specimen Description BLOOD LEFT FOREARM    Special Requests      BOTTLES DRAWN AEROBIC AND ANAEROBIC Blood Culture adequate volume   Culture      NO GROWTH < 12 HOURS Performed at [facility], [address], [place], [place] [address]    Report Status PENDING   CULTURE, BLOOD (ROUTINE X 2) w Reflex to ID Panel     Status: None (Preliminary result)   Collection Time: 04/06/20 10:41 PM   Specimen: BLOOD  Result Value Ref Range   Specimen Description BLOOD LEFT HAND    Special Requests      BOTTLES DRAWN AEROBIC AND ANAEROBIC Blood Culture adequate  volume   Culture      NO GROWTH < 12 HOURS Performed at [facility], [address] [place], [place] [address]    Report Status PENDING   Glucose, capillary     Status: Abnormal   Collection Time: 04/07/20 12:41 AM  Result Value Ref Range   Glucose-Capillary 301 (H) 70 - 99 mg/dL    Comment: Glucose reference range applies only to samples taken after fasting for at least 8 hours.  CBC     Status: None   Collection Time: 04/07/20 12:47 AM  Result Value Ref Range   WBC 9.0 4.0 - 10.5 K/uL   RBC 4.39 3.87 - 5.11 MIL/uL   Hemoglobin 12.5 12.0 - 15.0 g/dL   HCT 39.8 36.0 - 46.0 %   MCV 90.7 80.0 - 100.0 fL   MCH 28.5 26.0 - 34.0 pg   MCHC 31.4 30.0 - 36.0 g/dL   RDW 13.8 11.5 - 15.5 %   Platelets 199 150 - 400 K/uL   nRBC 0.0 0.0 - 0.2 %    Comment: Performed at [facility], [address], [place], [place] [id number]  Basic metabolic panel  Status: Abnormal   Collection Time: 04/07/20 12:47 AM  Result Value Ref Range   Sodium 138 135 - 145 mmol/L   Potassium 4.4 3.5 - 5.1 mmol/L   Chloride 98 98 - 111 mmol/L   CO2 27 22 - 32 mmol/L   Glucose, Bld 351 (H) 70 - 99 mg/dL    Comment: Glucose reference range applies only to samples taken after fasting for at least 8 hours.   BUN 25 (H) 8 - 23 mg/dL   Creatinine, Ser 1.15 (H) 0.44 - 1.00 mg/dL   Calcium 9.7 8.9 - 10.3 mg/dL   GFR, Estimated 52 (L) >60 mL/min    Comment: (NOTE) Calculated using the CKD-EPI Creatinine Equation (2021)    Anion gap 13 5 - 15    Comment: Performed at [facility], [address] [place], [place] [address]  Troponin I (High Sensitivity)     Status: Abnormal   Collection Time: 04/07/20 12:47 AM  Result Value Ref Range   Troponin I (High Sensitivity) 122 (HH) <18 ng/L    Comment: CRITICAL VALUE NOTED. VALUE IS CONSISTENT WITH PREVIOUSLY REPORTED/CALLED VALUE SKL (NOTE) Elevated high sensitivity troponin I (hsTnI) values and significant  changes across serial  measurements may suggest ACS but many other  chronic and acute conditions are known to elevate hsTnI results.  Refer to the "Links" section for chest pain algorithms and additional  guidance. Performed at [facility], [address] [place], [place] [address]   Glucose, capillary     Status: Abnormal   Collection Time: 04/07/20  8:21 AM  Result Value Ref Range   Glucose-Capillary 256 (H) 70 - 99 mg/dL    Comment: Glucose reference range applies only to samples taken after fasting for at least 8 hours.  MRSA PCR Screening     Status: None   Collection Time: 04/07/20  8:56 AM   Specimen: Nasopharyngeal  Result Value Ref Range   MRSA by PCR NEGATIVE NEGATIVE    Comment:        The GeneXpert MRSA Assay (FDA approved for NASAL specimens only), is one component of a comprehensive MRSA colonization surveillance program. It is not intended to diagnose MRSA infection nor to guide or monitor treatment for MRSA infections. Performed at [facility], [address] [place], [place] [address]   Glucose, capillary     Status: Abnormal   Collection Time: 04/07/20 12:04 PM  Result Value Ref Range   Glucose-Capillary 345 (H) 70 - 99 mg/dL    Comment: Glucose reference range applies only to samples taken after fasting for at least 8 hours.   DG Chest 2 View  Result Date: 04/06/2020 CLINICAL DATA:  Status post fall. EXAM: CHEST - 2 VIEW COMPARISON:  Chest radiograph May 31, 2019. FINDINGS: Stable enlarged cardiac and mediastinal contours. Aortic atherosclerosis. Bilateral mid lower lung patchy airspace opacities. No pleural effusion or pneumothorax. Thoracic spine degenerative changes. IMPRESSION: Bilateral mid and lower lung patchy opacities which may represent atelectasis or infection. Electronically Signed   By: Lovey Newcomer M.D.   On: 04/06/2020 18:59   DG Shoulder Right  Result Date: 04/06/2020 CLINICAL DATA:  Recent fall with right shoulder pain, initial encounter  EXAM: RIGHT SHOULDER - 2+ VIEW COMPARISON:  None. FINDINGS: Degenerative changes of the acromioclavicular and glenohumeral joints are seen. The humeral head is high-riding likely related to chronic rotator cuff injury. The underlying bony thorax appears within normal limits. No other focal abnormality is seen. IMPRESSION: Degenerative change  without acute abnormality. Electronically Signed   By: Inez Catalina M.D.   On: 04/06/2020 16:22   DG Tibia/Fibula Right  Result Date: 04/06/2020 CLINICAL DATA:  Recent fall with right leg pain, initial encounter EXAM: RIGHT TIBIA AND FIBULA - 2 VIEW COMPARISON:  None. FINDINGS: Tibia and fibula are within normal limits. There is an avulsion from the superior aspect of the calcaneus at the level of the Achilles insertion. No other focal abnormality is seen. IMPRESSION: Avulsion from the superior aspect of the calcaneus. Tibia and fibula are within normal limits. Electronically Signed   By: Inez Catalina M.D.   On: 04/06/2020 16:22   CT Head Wo Contrast  Result Date: 04/06/2020 CLINICAL DATA:  Patient status post fall. EXAM: CT HEAD WITHOUT CONTRAST TECHNIQUE: Contiguous axial images were obtained from the base of the skull through the vertex without intravenous contrast. COMPARISON:  Brain CT 03/17/2019. FINDINGS: Brain: Ventricles and sulci are appropriate for patient's age. Redemonstrated encephalomalacia from chronic left MCA territory infarct. No evidence for acute cortically based infarct, intracranial hemorrhage, mass lesion or mass-effect. Vascular: Unremarkable. Skull: Intact. Sinuses/Orbits: Paranasal sinuses are well aerated. Mastoid air cells are unremarkable. Orbits are unremarkable. Other: None. IMPRESSION: 1. No acute intracranial process. 2. Old left MCA territory infarct. Electronically Signed   By: Lovey Newcomer M.D.   On: 04/06/2020 16:00   DG Pelvis Portable  Result Date: 04/06/2020 CLINICAL DATA:  [place], right-sided pain EXAM: PORTABLE PELVIS 1-2 VIEWS  COMPARISON:  04/06/2020 FINDINGS: Supine frontal view of the pelvis demonstrates no acute displaced fractures. Enthesopathic changes are seen at the greater trochanters bilaterally, right greater than left. Symmetrical bilateral hip osteoarthritis. No subluxation or dislocation. There is mild sclerosis of the left SI joint. No erosive changes. Prominent degenerative changes at the lumbosacral junction. IMPRESSION: 1. No acute displaced fracture. If hip fracture remains a concern, follow-up CT or MRI could be considered. 2. Degenerative changes as above. Electronically Signed   By: Randa Ngo M.D.   On: 04/06/2020 17:02   MR FOOT RIGHT WO CONTRAST  Result Date: 04/07/2020 CLINICAL DATA:  [place], right foot pain EXAM: MRI OF THE RIGHT FOREFOOT WITHOUT CONTRAST TECHNIQUE: Multiplanar, multisequence MR imaging of the right forefoot was performed. No intravenous contrast was administered. COMPARISON:  None. FINDINGS: Bones/Joint/Cartilage Evaluation is limited due to patient pain. Unable to utilize dedicated foot coil, limiting resolution of the exam. There is diffuse edema within the third proximal phalanx. Cortical discontinuity within the proximal medial aspect of the third proximal phalanx could reflect fracture or osteomyelitis. There is mild marrow edema within the heads of the second third metatarsals, without evidence of cortical disruption. Remaining bony structures demonstrate normal signal characteristics. Ligaments No gross abnormality on this limited exam. Muscles and Tendons Small amount of fluid within the flexor tendon sheaths consistent with tenosynovitis. Otherwise no gross abnormalities on this limited exam. Soft tissues Mild subcutaneous edema within the plantar aspect of the forefoot at the level of the second and third metatarsal heads. No fluid collection. No evidence of soft tissue defect. IMPRESSION: 1. Diffuse edema within the third proximal phalanx, with cortical discontinuity in the  medial proximal aspect. Based on history, this is most consistent with fracture. 2. Mild edema within the heads of the second and third metatarsals, without clear cortical discontinuity. This could reflect contusion in the setting of trauma. 3. Mild soft tissue edema plantar aspect of the forefoot. No fluid collection or hematoma. 4. Fluid within the flexor tendon sheaths consistent with  mild tenosynovitis. Electronically Signed   By: Randa Ngo M.D.   On: 04/07/2020 00:02   MR ANKLE RIGHT WO CONTRAST  Result Date: 04/07/2020 CLINICAL DATA:  Right ankle pain after fall. EXAM: MRI OF THE RIGHT ANKLE WITHOUT CONTRAST TECHNIQUE: Multiplanar, multisequence MR imaging of the ankle was performed. No intravenous contrast was administered. COMPARISON:  MRI right foot from yesterday. Right tibia and fibula x-rays from yesterday. FINDINGS: TENDONS Peroneal: Peroneal longus tendon intact. Peroneal brevis intact. Posteromedial: Posterior tibial tendon intact with small amount of fluid in the tendon sheath. Flexor digitorum longus tendon intact. Flexor hallucis longus tendon intact. Anterior: Tibialis anterior tendon intact. Extensor hallucis longus tendon intact Extensor digitorum longus tendon intact. Achilles:  Intact. Plantar Fascia: Intact. Mild thickening of the proximal central band. LIGAMENTS Lateral: Anterior talofibular ligament intact. Calcaneofibular ligament intact. Posterior talofibular ligament intact. Anterior and posterior tibiofibular ligaments intact. Medial: Deltoid ligament intact. Spring ligament intact. CARTILAGE Ankle Joint: No significant joint effusion. Normal ankle mortise. Diffuse cartilage thinning without focal defect. Small marginal osteophytes. Subtalar Joints/Sinus Tarsi: Small posterior subtalar joint marginal osteophytes with trace joint fluid communicating with the sinus tarsi. Bones: Acute avulsion fracture of the calcaneal tuberosity at the Achilles insertion with up to 1.1 cm  distraction. Subcortical cystic change in the lateral talar process and adjacent calcaneus. Soft Tissue: Mild diffuse soft tissue swelling. No soft tissue mass or fluid collection. IMPRESSION: 1. Acute avulsion fracture of the calcaneal tuberosity at the Achilles insertion with up to 1.1 cm distraction. 2. Mild posterior tibial tenosynovitis. 3. Mild tibiotalar and posterior subtalar osteoarthritis. 4. Evidence of lateral hindfoot impingement. Electronically Signed   By: Titus Dubin M.D.   On: 04/07/2020 14:12   DG Femur Min 2 Views Right  Result Date: 04/06/2020 CLINICAL DATA:  Recent fall with right hip pain, initial encounter EXAM: RIGHT FEMUR 2 VIEWS COMPARISON:  None. FINDINGS: Degenerative changes of the right hip joint are noted. No acute fracture or dislocation is seen. No soft tissue abnormalities are noted. Mild degenerative changes of the knee joint are seen as well. IMPRESSION: No acute abnormality noted. Electronically Signed   By: Inez Catalina M.D.   On: 04/06/2020 16:20    Blood pressure (!) 115/55, pulse 72, temperature 98.2 F (36.8 C), resp. rate 20, height '5\' 6"'$  (1.676 m), weight 102.1 kg, SpO2 95 %.  Assessment 1. Displaced closed calcaneal avulsion fracture right foot involving Achilles tendon 2. Uncontrolled diabetes type 2 polyneuropathy 3. Urinary tract infection 4. History of stroke 5. History of polysubstance abuse  Plan -Patient seen and examined. -No tenting of the skin noted to the posterior aspect of the right heel.  No skin necrosis noted to the posterior aspect of the right heel.  Capillary fill time appears to be intact still to the posterior heel but notable bruising is noted to the area as well as swelling.  No obvious deformity noted to the posterior right heel. -Discussed treatment options with patient.  Believe that the best course of action right now is conservative care for the patient as she has uncontrolled diabetes and has a urinary tract infection.   Would not advise surgical intervention at this time due to complications of limb loss due to incision healing issues and potential infection. -Would recommend conservative care for this patient. -Reapplied Ace wrap today. -Order placed for posterior splint application with lots of cast padding prior to placing the splint with the foot held in equinus or plantarflexed position to try to reduce  the calcaneal fracture and reduce tension at the posterior aspect of the heel.  Patient should keep this splint on for the next week until further evaluation. -If unable to place splint then patient needs to be in a walking boot but still remain nonweightbearing to left lower extremity all times. -PT order in place  Podiatry team will follow more peripherally at this time with the appropriate discharge instructions and follow-up information  Caroline More, DPM 04/07/2020, 4:11 PM

## 2020-04-07 NOTE — Progress Notes (Signed)
*  PRELIMINARY RESULTS* Echocardiogram 2D Echocardiogram has been performed.  Sandra Massey 04/07/2020, 3:21 PM

## 2020-04-08 DIAGNOSIS — E1169 Type 2 diabetes mellitus with other specified complication: Secondary | ICD-10-CM

## 2020-04-08 DIAGNOSIS — N390 Urinary tract infection, site not specified: Secondary | ICD-10-CM | POA: Diagnosis not present

## 2020-04-08 DIAGNOSIS — S92031A Displaced avulsion fracture of tuberosity of right calcaneus, initial encounter for closed fracture: Secondary | ICD-10-CM | POA: Diagnosis not present

## 2020-04-08 LAB — CBC
HCT: 36.3 % (ref 36.0–46.0)
Hemoglobin: 11.8 g/dL — ABNORMAL LOW (ref 12.0–15.0)
MCH: 29.3 pg (ref 26.0–34.0)
MCHC: 32.5 g/dL (ref 30.0–36.0)
MCV: 90.1 fL (ref 80.0–100.0)
Platelets: 181 10*3/uL (ref 150–400)
RBC: 4.03 MIL/uL (ref 3.87–5.11)
RDW: 14 % (ref 11.5–15.5)
WBC: 8.9 10*3/uL (ref 4.0–10.5)
nRBC: 0 % (ref 0.0–0.2)

## 2020-04-08 LAB — BASIC METABOLIC PANEL
Anion gap: 9 (ref 5–15)
BUN: 23 mg/dL (ref 8–23)
CO2: 27 mmol/L (ref 22–32)
Calcium: 9.4 mg/dL (ref 8.9–10.3)
Chloride: 103 mmol/L (ref 98–111)
Creatinine, Ser: 1.02 mg/dL — ABNORMAL HIGH (ref 0.44–1.00)
GFR, Estimated: 60 mL/min — ABNORMAL LOW (ref >60)
Glucose, Bld: 221 mg/dL — ABNORMAL HIGH (ref 70–99)
Potassium: 4.5 mmol/L (ref 3.5–5.1)
Sodium: 139 mmol/L (ref 135–145)

## 2020-04-08 LAB — GLUCOSE, CAPILLARY
Glucose-Capillary: 180 mg/dL — ABNORMAL HIGH (ref 70–99)
Glucose-Capillary: 288 mg/dL — ABNORMAL HIGH (ref 70–99)
Glucose-Capillary: 288 mg/dL — ABNORMAL HIGH (ref 70–99)
Glucose-Capillary: 330 mg/dL — ABNORMAL HIGH (ref 70–99)

## 2020-04-08 MED ORDER — APIXABAN 5 MG PO TABS
5.0000 mg | ORAL_TABLET | Freq: Two times a day (BID) | ORAL | Status: DC
  Administered 2020-04-08 – 2020-04-13 (×11): 5 mg via ORAL
  Filled 2020-04-08 (×11): qty 1

## 2020-04-08 NOTE — TOC Initial Note (Signed)
Transition of Care [facility]) - Initial/Assessment Note    Patient Details  Name: Sandra Massey MRN: [id number] Date of Birth: Nov 29, 1950  [facility] CM/SW Contact:    Sandra Sharper, LCSW Phone Number: 04/08/2020, 1:23 PM  Clinical Narrative:                  CSW was requested to speak with pt's sister Sandra Massey about SNF options. CSW reached out to [place] regarding pt's discharge plan and Sandra Massey requested that pt go to [facility] for rehab because their other sister is there for rehab. CSW completed FL2 and PASRR and faxed to [facility] in [place].   Expected Discharge Plan: [place] Barriers to Discharge: Continued Medical Work up   Patient Goals and CMS Choice Patient states their goals for this hospitalization and ongoing recovery are:: to go to rehab and recover CMS Medicare.gov Compare Post Acute Care list provided to:: Patient Choice offered to / list presented to : [place]  Expected Discharge Plan and Services Expected Discharge Plan: [place] arrangements for the past 2 months: Single Family Home                                      Prior Living Arrangements/Services Living arrangements for the past 2 months: Single Family Home   Patient language and need for interpreter reviewed:: Yes        Need for Family Participation in Patient Care: Yes (Comment) Care giver support system in place?: Yes (comment)   Criminal Activity/Legal Involvement Pertinent to Current Situation/Hospitalization: No - Comment as needed  Activities of Daily Living      Permission Sought/Granted   Permission granted to share information with : Yes, Verbal Permission Granted  Share Information with NAME: Sandra Massey     Permission granted to share info w Relationship: sister  Permission granted to share info w Contact Information: [phone]  Emotional Assessment Appearance:: Other (Comment Required (unable to  assess) Attitude/Demeanor/Rapport: Unable to Assess Affect (typically observed): Unable to Assess Orientation: : Oriented to Self,Oriented to Place Alcohol / Substance Use: Not Applicable Psych Involvement: No (comment)  Admission diagnosis:  Weak [R53.1] Elevated troponin [R77.8] Closed right calcaneal fracture [S92.001A] Closed displaced avulsion fracture of tuberosity of right calcaneus, initial encounter [S92.031A] Fall, initial encounter [W19.XXXA] Urinary tract infection with hematuria, site unspecified [N39.0, R31.9] Heel fracture [S92.009A] Patient Active Problem List   Diagnosis Date Noted  . Heel fracture 04/07/2020  . Closed right calcaneal fracture 04/06/2020  . Essential hypertension 04/06/2020  . Pressure injury of skin 06/05/2019  . Weakness   . Sepsis with acute renal failure and septic shock ([place]) 06/01/2019  . Acute kidney injury (AKI) with acute tubular necrosis (ATN) (HCC)   . Lactic acidosis   . Chronic a-fib ([place])   . Kidney stones   . DKA (diabetic ketoacidoses) 05/31/2019   PCP:  [name], MD Pharmacy:   [facility] [address] [place]) [place] [address] Phone: [phone] Fax: [phone]  [facility] [address] [place] [address] Phone: [phone]      Social Determinants of Health (SDOH) Interventions    Readmission Risk Interventions No flowsheet data found.

## 2020-04-08 NOTE — Discharge Instructions (Signed)
Foot and ankle surgery discharge instructions: 1. Keep splint type dressing clean, dry, and intact.  If you are not placed in a splint then keep on boot at all times.  Do not remove the boot or splint as it is acting as a cast. 2.  Recommend ice and elevation of the right ankle/foot to control swelling further. 3.  Do not put any weight on the right foot. 4.  Please make follow-up appointment within 1 week of your discharge date.

## 2020-04-08 NOTE — Progress Notes (Signed)
PODIATRY / FOOT AND ANKLE SURGERY CONSULTATION NOTE  Requesting Physician: Eppie Gibson, MD  Reason for consult: Right calcaneal avulsion fracture  Chief Complaint: Right heel pain   HPI: Sandra Massey is a 70 y.o. female who presents resting in bed comfortably today. Patient still complains of pain to the posterior aspect of the right heel. Patient has not put weight on the foot since the injury. Patient has Ace wrap is applied but no one has came to put on the posterior splint despite it being ordered yesterday.  PMHx:  Past Medical History:  Diagnosis Date  . Arthritis   . Diabetes mellitus without complication ([place])   . Hypertension   . Substance abuse [facility])     Surgical Hx:  Past Surgical History:  Procedure Laterality Date  . BREAST BIOPSY Right    sebaceous cyst removed    FHx: No family history on file.  Social History:  reports that she has never smoked. She has never used smokeless tobacco. She reports that she does not drink alcohol and does not use drugs.  Allergies:  Allergies  Allergen Reactions  . Latex Rash    Review of Systems: General ROS: negative Respiratory ROS: no cough, shortness of breath, or wheezing Cardiovascular ROS: no chest pain or dyspnea on exertion Gastrointestinal ROS: no abdominal pain, change in bowel habits, or black or bloody stools Musculoskeletal ROS: positive for - joint pain, joint stiffness and joint swelling Neurological ROS: positive for - numbness/tingling Dermatological ROS: negative  Medications Prior to Admission  Medication Sig Dispense Refill  . amiodarone (PACERONE) 200 MG tablet Take 1 tablet (200 mg total) by mouth 2 (two) times daily. (Patient taking differently: Take 200 mg by mouth daily.) 60 tablet 0  . Ascorbic Acid (VITA-C PO) Take 1 tablet by mouth daily.    Marland Kitchen ELIQUIS 5 MG TABS tablet Take 5 mg by mouth 2 (two) times daily.    . furosemide (LASIX) 20 MG tablet Take 20 mg by mouth daily.    . insulin  aspart (NOVOLOG) 100 UNIT/ML FlexPen Inject 7 Units into the skin 3 (three) times daily with meals. (Patient taking differently: Inject into the skin See admin instructions. Per sliding scale up to 25 units daily) 1 pen 0  . insulin glargine (LANTUS) 100 UNIT/ML Solostar Pen Inject 32 Units into the skin daily. (Patient taking differently: Inject 35 Units into the skin daily.) 1 pen 0  . lisinopril-hydrochlorothiazide (ZESTORETIC) 20-25 MG tablet Take 1 tablet by mouth daily.    . Multiple Vitamin (MULTIVITAMIN WITH MINERALS) TABS tablet Take 1 tablet by mouth daily.    . rosuvastatin (CRESTOR) 40 MG tablet Take 40 mg by mouth daily.    . sertraline (ZOLOFT) 50 MG tablet Take 50 mg by mouth daily.    Marland Kitchen spironolactone (ALDACTONE) 25 MG tablet Take 25 mg by mouth daily.    . Insulin Pen Needle 31G X 8 MM MISC 1 needle with each injection of insulin 100 each 0    Physical Exam: General: Alert and oriented.  No apparent distress.  Vascular: DP/PT pulses faintly palpable bilateral.  Moderate edema present to bilateral lower extremities.  Moderate edema present to the posterior aspect of the right heel with corresponding ecchymosis.  Neuro: Light touch sensation reduced to digits bilateral.  Derm: Right posterior heel skin appears to be intact with capillary fill time intact.  No evidence of necrosis at this time.  Skin does appear to be thin and atrophic to both  feet though overall.  MSK: Pain on palpation to the right posterior heel.  Deferred range of motion examination due to posttraumatic state.  No obvious deformity noted to the posterior aspect of the right heel.  Results for orders placed or performed during the hospital encounter of 04/06/20 (from the past 48 hour(s))  CBC     Status: Abnormal   Collection Time: 04/06/20  3:03 PM  Result Value Ref Range   WBC 12.8 (H) 4.0 - 10.5 K/uL   RBC 4.55 3.87 - 5.11 MIL/uL   Hemoglobin 13.1 12.0 - 15.0 g/dL   HCT 40.8 36.0 - 46.0 %   MCV 89.7  80.0 - 100.0 fL   MCH 28.8 26.0 - 34.0 pg   MCHC 32.1 30.0 - 36.0 g/dL   RDW 13.9 11.5 - 15.5 %   Platelets 224 150 - 400 K/uL   nRBC 0.0 0.0 - 0.2 %    Comment: Performed at [facility], [address] [place], [place] [address]  Comprehensive metabolic panel     Status: Abnormal   Collection Time: 04/06/20  3:03 PM  Result Value Ref Range   Sodium 138 135 - 145 mmol/L   Potassium 4.6 3.5 - 5.1 mmol/L   Chloride 100 98 - 111 mmol/L   CO2 29 22 - 32 mmol/L   Glucose, Bld 205 (H) 70 - 99 mg/dL    Comment: Glucose reference range applies only to samples taken after fasting for at least 8 hours.   BUN 28 (H) 8 - 23 mg/dL   Creatinine, Ser 1.07 (H) 0.44 - 1.00 mg/dL   Calcium 9.7 8.9 - 10.3 mg/dL   Total Protein 8.0 6.5 - 8.1 g/dL   Albumin 3.7 3.5 - 5.0 g/dL   AST 30 15 - 41 U/L   ALT 32 0 - 44 U/L   Alkaline Phosphatase 100 38 - 126 U/L   Total Bilirubin 1.0 0.3 - 1.2 mg/dL   GFR, Estimated 57 (L) >60 mL/min    Comment: (NOTE) Calculated using the CKD-EPI Creatinine Equation (2021)    Anion gap 9 5 - 15    Comment: Performed at [facility], [place], [place] [address]  Troponin I (High Sensitivity)     Status: Abnormal   Collection Time: 04/06/20  3:03 PM  Result Value Ref Range   Troponin I (High Sensitivity) 85 (H) <18 ng/L    Comment: (NOTE) Elevated high sensitivity troponin I (hsTnI) values and significant  changes across serial measurements may suggest ACS but many other  chronic and acute conditions are known to elevate hsTnI results.  Refer to the "Links" section for chest pain algorithms and additional  guidance. Performed at [facility], [address] [place],  [address]   CBG monitoring, ED     Status: Abnormal   Collection Time: 04/06/20  3:05 PM  Result Value Ref Range   Glucose-Capillary 185 (H) 70 - 99 mg/dL    Comment: Glucose reference range applies only to samples taken after fasting for at least  8 hours.  Urinalysis, Complete w Microscopic Urine, Clean Catch     Status: Abnormal   Collection Time: 04/06/20  7:45 PM  Result Value Ref Range   Color, Urine YELLOW (A) YELLOW   APPearance HAZY (A) CLEAR   Specific Gravity, Urine 1.017 1.005 - 1.030   pH 7.0 5.0 - 8.0   Glucose, UA NEGATIVE NEGATIVE mg/dL   Hgb urine dipstick MODERATE (A) NEGATIVE  Bilirubin Urine NEGATIVE NEGATIVE   Ketones, ur NEGATIVE NEGATIVE mg/dL   Protein, ur NEGATIVE NEGATIVE mg/dL   Nitrite NEGATIVE NEGATIVE   Leukocytes,Ua TRACE (A) NEGATIVE   RBC / HPF 6-10 0 - 5 RBC/hpf   WBC, UA 11-20 0 - 5 WBC/hpf   Bacteria, UA RARE (A) NONE SEEN   Squamous Epithelial / LPF 0-5 0 - 5   Mucus PRESENT     Comment: Performed at [facility], [address] [place], [place] [address]  Troponin I (High Sensitivity)     Status: Abnormal   Collection Time: 04/06/20  8:24 PM  Result Value Ref Range   Troponin I (High Sensitivity) 106 (HH) <18 ng/L    Comment: CRITICAL RESULT CALLED TO, READ BACK BY AND VERIFIED WITH DEBORAH EMBDEN '@2135'$  04/06/20 RH (NOTE) Elevated high sensitivity troponin I (hsTnI) values and significant  changes across serial measurements may suggest ACS but many other  chronic and acute conditions are known to elevate hsTnI results.  Refer to the "Links" section for chest pain algorithms and additional  guidance. Performed at [facility], [place], [place] [address]   SARS CORONAVIRUS 2 (TAT 6-24 HRS) Nasopharyngeal Nasopharyngeal Swab     Status: None   Collection Time: 04/06/20  8:28 PM   Specimen: Nasopharyngeal Swab  Result Value Ref Range   SARS Coronavirus 2 NEGATIVE NEGATIVE    Comment: (NOTE) SARS-CoV-2 target nucleic acids are NOT DETECTED.  The SARS-CoV-2 RNA is generally detectable in upper and lower respiratory specimens during the acute phase of infection. Negative results do not preclude SARS-CoV-2 infection, do not rule out co-infections  with other pathogens, and should not be used as the sole basis for treatment or other patient management decisions. Negative results must be combined with clinical observations, patient history, and epidemiological information. The expected result is Negative.  Fact Sheet for Patients: SugarRoll.be  Fact Sheet for Healthcare Providers: https://www.woods-mathews.com/  This test is not yet approved or cleared by the [place] FDA and  has been authorized for detection and/or diagnosis of SARS-CoV-2 by FDA under an Emergency Use Authorization (EUA). This EUA will remain  in effect (meaning this test can be used) for the duration of the COVID-19 declaration under Se ction 564(b)(1) of the Act, 21 U.S.C. section 360bbb-3(b)(1), unless the authorization is terminated or revoked sooner.  Performed at [facility], [place] [address] [place], [place] [address]   TSH     Status: None   Collection Time: 04/06/20 10:03 PM  Result Value Ref Range   TSH 3.578 0.350 - 4.500 uIU/mL    Comment: Performed by a 3rd Generation assay with a functional sensitivity of <=0.01 uIU/mL. Performed at [facility], [address] [place], [place] [address]   Hemoglobin A1c     Status: Abnormal   Collection Time: 04/06/20 10:03 PM  Result Value Ref Range   Hgb A1c MFr Bld 11.3 (H) 4.8 - 5.6 %    Comment: (NOTE) Pre diabetes:          5.7%-6.4%  Diabetes:              >6.4%  Glycemic control for   <7.0% adults with diabetes    Mean Plasma Glucose 277.61 mg/dL    Comment: Performed at [facility] [address] [place], [place] [address]  Protime-INR     Status: Abnormal   Collection Time: 04/06/20 10:03 PM  Result Value Ref Range  Prothrombin Time 15.6 (H) 11.4 - 15.2 seconds   INR 1.3 (H) 0.8 - 1.2    Comment: (NOTE) INR goal varies based on device and disease states. Performed at [facility], [address],  [place], [place] [address]   Urine Culture     Status: Abnormal (Preliminary result)   Collection Time: 04/06/20 10:10 PM   Specimen: Urine, Clean Catch  Result Value Ref Range   Specimen Description      URINE, CLEAN CATCH Performed at [facility], [address] [place], [place] [address]    Special Requests      NONE Performed at [facility], [place], [place] [address]    Culture (A)     >=100,000 COLONIES/mL ESCHERICHIA COLI SUSCEPTIBILITIES TO FOLLOW Performed at [facility] [address], [place], [place] [address]    Report Status PENDING   Troponin I (High Sensitivity)     Status: Abnormal   Collection Time: 04/06/20 10:12 PM  Result Value Ref Range   Troponin I (High Sensitivity) 101 (HH) <18 ng/L    Comment: CRITICAL VALUE NOTED. VALUE IS CONSISTENT WITH PREVIOUSLY REPORTED/CALLED VALUE RH (NOTE) Elevated high sensitivity troponin I (hsTnI) values and significant  changes across serial measurements may suggest ACS but many other  chronic and acute conditions are known to elevate hsTnI results.  Refer to the "Links" section for chest pain algorithms and additional  guidance. Performed at [facility], [address] [place], [place] [address]   Brain natriuretic peptide     Status: Abnormal   Collection Time: 04/06/20 10:40 PM  Result Value Ref Range   B Natriuretic Peptide 201.4 (H) 0.0 - 100.0 pg/mL    Comment: Performed at [facility], [address] [place], [place] [address]  CULTURE, BLOOD (ROUTINE X 2) w Reflex to ID Panel     Status: None (Preliminary result)   Collection Time: 04/06/20 10:40 PM   Specimen: BLOOD  Result Value Ref Range   Specimen Description BLOOD LEFT FOREARM    Special Requests      BOTTLES DRAWN AEROBIC AND ANAEROBIC Blood Culture adequate volume   Culture      NO GROWTH 2 DAYS Performed at [facility], [address] [place], [place] [address]     Report Status PENDING   CULTURE, BLOOD (ROUTINE X 2) w Reflex to ID Panel     Status: None (Preliminary result)   Collection Time: 04/06/20 10:41 PM   Specimen: BLOOD  Result Value Ref Range   Specimen Description BLOOD LEFT HAND    Special Requests      BOTTLES DRAWN AEROBIC AND ANAEROBIC Blood Culture adequate volume   Culture      NO GROWTH 2 DAYS Performed at [facility], [address], [place], [place] [address]    Report Status PENDING   Glucose, capillary     Status: Abnormal   Collection Time: 04/07/20 12:41 AM  Result Value Ref Range   Glucose-Capillary 301 (H) 70 - 99 mg/dL    Comment: Glucose reference range applies only to samples taken after fasting for at least 8 hours.  CBC     Status: None   Collection Time: 04/07/20 12:47 AM  Result Value Ref Range   WBC 9.0 4.0 - 10.5 K/uL   RBC 4.39 3.87 - 5.11 MIL/uL   Hemoglobin 12.5 12.0 - 15.0 g/dL   HCT 39.8 36.0 - 46.0 %  MCV 90.7 80.0 - 100.0 fL   MCH 28.5 26.0 - 34.0 pg   MCHC 31.4 30.0 - 36.0 g/dL   RDW 13.8 11.5 - 15.5 %   Platelets 199 150 - 400 K/uL   nRBC 0.0 0.0 - 0.2 %    Comment: Performed at [facility], [address] [place], [place] [id number]  Basic metabolic panel     Status: Abnormal   Collection Time: 04/07/20 12:47 AM  Result Value Ref Range   Sodium 138 135 - 145 mmol/L   Potassium 4.4 3.5 - 5.1 mmol/L   Chloride 98 98 - 111 mmol/L   CO2 27 22 - 32 mmol/L   Glucose, Bld 351 (H) 70 - 99 mg/dL    Comment: Glucose reference range applies only to samples taken after fasting for at least 8 hours.   BUN 25 (H) 8 - 23 mg/dL   Creatinine, Ser 1.15 (H) 0.44 - 1.00 mg/dL   Calcium 9.7 8.9 - 10.3 mg/dL   GFR, Estimated 52 (L) >60 mL/min    Comment: (NOTE) Calculated using the CKD-EPI Creatinine Equation (2021)    Anion gap 13 5 - 15    Comment: Performed at [facility], [address] [place], [place] [address]  Troponin I (High Sensitivity)     Status: Abnormal    Collection Time: 04/07/20 12:47 AM  Result Value Ref Range   Troponin I (High Sensitivity) 122 (HH) <18 ng/L    Comment: CRITICAL VALUE NOTED. VALUE IS CONSISTENT WITH PREVIOUSLY REPORTED/CALLED VALUE SKL (NOTE) Elevated high sensitivity troponin I (hsTnI) values and significant  changes across serial measurements may suggest ACS but many other  chronic and acute conditions are known to elevate hsTnI results.  Refer to the "Links" section for chest pain algorithms and additional  guidance. Performed at [facility], [address] [place], [place] [address]   Glucose, capillary     Status: Abnormal   Collection Time: 04/07/20  8:21 AM  Result Value Ref Range   Glucose-Capillary 256 (H) 70 - 99 mg/dL    Comment: Glucose reference range applies only to samples taken after fasting for at least 8 hours.  MRSA PCR Screening     Status: None   Collection Time: 04/07/20  8:56 AM   Specimen: Nasopharyngeal  Result Value Ref Range   MRSA by PCR NEGATIVE NEGATIVE    Comment:        The GeneXpert MRSA Assay (FDA approved for NASAL specimens only), is one component of a comprehensive MRSA colonization surveillance program. It is not intended to diagnose MRSA infection nor to guide or monitor treatment for MRSA infections. Performed at [facility], [address] [place], [place] [address]   Glucose, capillary     Status: Abnormal   Collection Time: 04/07/20 12:04 PM  Result Value Ref Range   Glucose-Capillary 345 (H) 70 - 99 mg/dL    Comment: Glucose reference range applies only to samples taken after fasting for at least 8 hours.  Glucose, capillary     Status: Abnormal   Collection Time: 04/07/20  5:25 PM  Result Value Ref Range   Glucose-Capillary 267 (H) 70 - 99 mg/dL    Comment: Glucose reference range applies only to samples taken after fasting for at least 8 hours.  Glucose, capillary     Status: Abnormal   Collection Time: 04/07/20 10:22 PM  Result  Value Ref Range   Glucose-Capillary 284 (H) 70 - 99 mg/dL  Comment: Glucose reference range applies only to samples taken after fasting for at least 8 hours.   Comment 1 Notify RN   CBC     Status: Abnormal   Collection Time: 04/08/20  5:01 AM  Result Value Ref Range   WBC 8.9 4.0 - 10.5 K/uL   RBC 4.03 3.87 - 5.11 MIL/uL   Hemoglobin 11.8 (L) 12.0 - 15.0 g/dL   HCT 36.3 36.0 - 46.0 %   MCV 90.1 80.0 - 100.0 fL   MCH 29.3 26.0 - 34.0 pg   MCHC 32.5 30.0 - 36.0 g/dL   RDW 14.0 11.5 - 15.5 %   Platelets 181 150 - 400 K/uL   nRBC 0.0 0.0 - 0.2 %    Comment: Performed at [facility], [address], [place], [place] [id number]  Basic metabolic panel     Status: Abnormal   Collection Time: 04/08/20  5:01 AM  Result Value Ref Range   Sodium 139 135 - 145 mmol/L   Potassium 4.5 3.5 - 5.1 mmol/L   Chloride 103 98 - 111 mmol/L   CO2 27 22 - 32 mmol/L   Glucose, Bld 221 (H) 70 - 99 mg/dL    Comment: Glucose reference range applies only to samples taken after fasting for at least 8 hours.   BUN 23 8 - 23 mg/dL   Creatinine, Ser 1.02 (H) 0.44 - 1.00 mg/dL   Calcium 9.4 8.9 - 10.3 mg/dL   GFR, Estimated 60 (L) >60 mL/min    Comment: (NOTE) Calculated using the CKD-EPI Creatinine Equation (2021)    Anion gap 9 5 - 15    Comment: Performed at [facility], [address] [place], [place] [address]  Glucose, capillary     Status: Abnormal   Collection Time: 04/08/20  8:31 AM  Result Value Ref Range   Glucose-Capillary 180 (H) 70 - 99 mg/dL    Comment: Glucose reference range applies only to samples taken after fasting for at least 8 hours.  Glucose, capillary     Status: Abnormal   Collection Time: 04/08/20 11:38 AM  Result Value Ref Range   Glucose-Capillary 288 (H) 70 - 99 mg/dL    Comment: Glucose reference range applies only to samples taken after fasting for at least 8 hours.   DG Chest 2 View  Result Date: 04/06/2020 CLINICAL DATA:  Status post fall.  EXAM: CHEST - 2 VIEW COMPARISON:  Chest radiograph May 31, 2019. FINDINGS: Stable enlarged cardiac and mediastinal contours. Aortic atherosclerosis. Bilateral mid lower lung patchy airspace opacities. No pleural effusion or pneumothorax. Thoracic spine degenerative changes. IMPRESSION: Bilateral mid and lower lung patchy opacities which may represent atelectasis or infection. Electronically Signed   By: Lovey Newcomer M.D.   On: 04/06/2020 18:59   DG Shoulder Right  Result Date: 04/06/2020 CLINICAL DATA:  Recent fall with right shoulder pain, initial encounter EXAM: RIGHT SHOULDER - 2+ VIEW COMPARISON:  None. FINDINGS: Degenerative changes of the acromioclavicular and glenohumeral joints are seen. The humeral head is high-riding likely related to chronic rotator cuff injury. The underlying bony thorax appears within normal limits. No other focal abnormality is seen. IMPRESSION: Degenerative change without acute abnormality. Electronically Signed   By: Inez Catalina M.D.   On: 04/06/2020 16:22   DG Tibia/Fibula Right  Result Date: 04/06/2020 CLINICAL DATA:  Recent fall with right leg pain, initial encounter EXAM: RIGHT TIBIA AND FIBULA - 2 VIEW COMPARISON:  None. FINDINGS: Tibia and fibula are within  normal limits. There is an avulsion from the superior aspect of the calcaneus at the level of the Achilles insertion. No other focal abnormality is seen. IMPRESSION: Avulsion from the superior aspect of the calcaneus. Tibia and fibula are within normal limits. Electronically Signed   By: Inez Catalina M.D.   On: 04/06/2020 16:22   CT Head Wo Contrast  Result Date: 04/06/2020 CLINICAL DATA:  Patient status post fall. EXAM: CT HEAD WITHOUT CONTRAST TECHNIQUE: Contiguous axial images were obtained from the base of the skull through the vertex without intravenous contrast. COMPARISON:  Brain CT 03/17/2019. FINDINGS: Brain: Ventricles and sulci are appropriate for patient's age. Redemonstrated encephalomalacia from  chronic left MCA territory infarct. No evidence for acute cortically based infarct, intracranial hemorrhage, mass lesion or mass-effect. Vascular: Unremarkable. Skull: Intact. Sinuses/Orbits: Paranasal sinuses are well aerated. Mastoid air cells are unremarkable. Orbits are unremarkable. Other: None. IMPRESSION: 1. No acute intracranial process. 2. Old left MCA territory infarct. Electronically Signed   By: Lovey Newcomer M.D.   On: 04/06/2020 16:00   DG Pelvis Portable  Result Date: 04/06/2020 CLINICAL DATA:  [place], right-sided pain EXAM: PORTABLE PELVIS 1-2 VIEWS COMPARISON:  04/06/2020 FINDINGS: Supine frontal view of the pelvis demonstrates no acute displaced fractures. Enthesopathic changes are seen at the greater trochanters bilaterally, right greater than left. Symmetrical bilateral hip osteoarthritis. No subluxation or dislocation. There is mild sclerosis of the left SI joint. No erosive changes. Prominent degenerative changes at the lumbosacral junction. IMPRESSION: 1. No acute displaced fracture. If hip fracture remains a concern, follow-up CT or MRI could be considered. 2. Degenerative changes as above. Electronically Signed   By: Randa Ngo M.D.   On: 04/06/2020 17:02   MR FOOT RIGHT WO CONTRAST  Result Date: 04/07/2020 CLINICAL DATA:  [place], right foot pain EXAM: MRI OF THE RIGHT FOREFOOT WITHOUT CONTRAST TECHNIQUE: Multiplanar, multisequence MR imaging of the right forefoot was performed. No intravenous contrast was administered. COMPARISON:  None. FINDINGS: Bones/Joint/Cartilage Evaluation is limited due to patient pain. Unable to utilize dedicated foot coil, limiting resolution of the exam. There is diffuse edema within the third proximal phalanx. Cortical discontinuity within the proximal medial aspect of the third proximal phalanx could reflect fracture or osteomyelitis. There is mild marrow edema within the heads of the second third metatarsals, without evidence of cortical disruption.  Remaining bony structures demonstrate normal signal characteristics. Ligaments No gross abnormality on this limited exam. Muscles and Tendons Small amount of fluid within the flexor tendon sheaths consistent with tenosynovitis. Otherwise no gross abnormalities on this limited exam. Soft tissues Mild subcutaneous edema within the plantar aspect of the forefoot at the level of the second and third metatarsal heads. No fluid collection. No evidence of soft tissue defect. IMPRESSION: 1. Diffuse edema within the third proximal phalanx, with cortical discontinuity in the medial proximal aspect. Based on history, this is most consistent with fracture. 2. Mild edema within the heads of the second and third metatarsals, without clear cortical discontinuity. This could reflect contusion in the setting of trauma. 3. Mild soft tissue edema plantar aspect of the forefoot. No fluid collection or hematoma. 4. Fluid within the flexor tendon sheaths consistent with mild tenosynovitis. Electronically Signed   By: Randa Ngo M.D.   On: 04/07/2020 00:02   MR ANKLE RIGHT WO CONTRAST  Result Date: 04/07/2020 CLINICAL DATA:  Right ankle pain after fall. EXAM: MRI OF THE RIGHT ANKLE WITHOUT CONTRAST TECHNIQUE: Multiplanar, multisequence MR imaging of the ankle was performed. No  intravenous contrast was administered. COMPARISON:  MRI right foot from yesterday. Right tibia and fibula x-rays from yesterday. FINDINGS: TENDONS Peroneal: Peroneal longus tendon intact. Peroneal brevis intact. Posteromedial: Posterior tibial tendon intact with small amount of fluid in the tendon sheath. Flexor digitorum longus tendon intact. Flexor hallucis longus tendon intact. Anterior: Tibialis anterior tendon intact. Extensor hallucis longus tendon intact Extensor digitorum longus tendon intact. Achilles:  Intact. Plantar Fascia: Intact. Mild thickening of the proximal central band. LIGAMENTS Lateral: Anterior talofibular ligament intact.  Calcaneofibular ligament intact. Posterior talofibular ligament intact. Anterior and posterior tibiofibular ligaments intact. Medial: Deltoid ligament intact. Spring ligament intact. CARTILAGE Ankle Joint: No significant joint effusion. Normal ankle mortise. Diffuse cartilage thinning without focal defect. Small marginal osteophytes. Subtalar Joints/Sinus Tarsi: Small posterior subtalar joint marginal osteophytes with trace joint fluid communicating with the sinus tarsi. Bones: Acute avulsion fracture of the calcaneal tuberosity at the Achilles insertion with up to 1.1 cm distraction. Subcortical cystic change in the lateral talar process and adjacent calcaneus. Soft Tissue: Mild diffuse soft tissue swelling. No soft tissue mass or fluid collection. IMPRESSION: 1. Acute avulsion fracture of the calcaneal tuberosity at the Achilles insertion with up to 1.1 cm distraction. 2. Mild posterior tibial tenosynovitis. 3. Mild tibiotalar and posterior subtalar osteoarthritis. 4. Evidence of lateral hindfoot impingement. Electronically Signed   By: Titus Dubin M.D.   On: 04/07/2020 14:12   ECHOCARDIOGRAM COMPLETE  Result Date: 04/07/2020    ECHOCARDIOGRAM REPORT   Patient Name:   TRINICE BREON Date of Exam: 04/07/2020 Medical Rec #:  [id number]    Height:       66.0 in Accession #:    [id number]   Weight:       225.0 lb Date of Birth:  1950/04/07   BSA:          2.102 m Patient Age:    9 years     BP:           115/55 mmHg Patient Gender: F            HR:           72 bpm. Exam Location:  [facility] Procedure: 2D Echo, Color Doppler and Cardiac Doppler Indications:     Elevated troponin  History:         Patient has prior history of Echocardiogram examinations, most                  recent 06/02/2019. Risk Factors:Hypertension and Diabetes.                  Substance abuse.  Sonographer:     Sherrie Sport RDCS (AE) Referring Phys:  [id number] AMY N [facility] Diagnosing Phys: Ida Rogue MD IMPRESSIONS  1. Left ventricular ejection  fraction, by estimation, is 50 to 55%. The left ventricle has low normal function. Mild hypokinesis of the basal regions. Left ventricular diastolic parameters are indeterminate.  2. Right ventricular systolic function is normal. The right ventricular size is normal.  3. Left atrial size was moderately dilated.  4. The mitral valve is normal in structure. Mild mitral valve regurgitation. No evidence of mitral stenosis.  5. Tricuspid valve regurgitation is moderate. FINDINGS  Left Ventricle: Left ventricular ejection fraction, by estimation, is 50 to 55%. The left ventricle has low normal function. The left ventricle has no regional wall motion abnormalities. The left ventricular internal cavity size was normal in size. There is no left ventricular hypertrophy. Left ventricular diastolic parameters are  indeterminate. Right Ventricle: The right ventricular size is normal. No increase in right ventricular wall thickness. Right ventricular systolic function is normal. Left Atrium: Left atrial size was moderately dilated. Right Atrium: Right atrial size was normal in size. Pericardium: There is no evidence of pericardial effusion. Mitral Valve: The mitral valve is normal in structure. Mild mitral valve regurgitation. No evidence of mitral valve stenosis. Tricuspid Valve: The tricuspid valve is normal in structure. Tricuspid valve regurgitation is moderate . No evidence of tricuspid stenosis. Aortic Valve: The aortic valve was not well visualized. Aortic valve regurgitation is not visualized. No aortic stenosis is present. Aortic valve mean gradient measures 4.0 mmHg. Aortic valve peak gradient measures 6.1 mmHg. Aortic valve area, by VTI measures 2.15 cm. Pulmonic Valve: The pulmonic valve was normal in structure. Pulmonic valve regurgitation is not visualized. No evidence of pulmonic stenosis. Aorta: The aortic root is normal in size and structure. Venous: The inferior vena cava is normal in size with greater than 50%  respiratory variability, suggesting right atrial pressure of 3 mmHg. IAS/Shunts: No atrial level shunt detected by color flow Doppler.  LEFT VENTRICLE PLAX 2D LVIDd:         5.47 cm LVIDs:         3.89 cm LV PW:         0.99 cm LV IVS:        0.97 cm LVOT diam:     2.00 cm LV SV:         49 LV SV Index:   23 LVOT Area:     3.14 cm  RIGHT VENTRICLE RV Basal diam:  3.95 cm RV S prime:     9.90 cm/s TAPSE (M-mode): 3.2 cm LEFT ATRIUM              Index       RIGHT ATRIUM           Index LA diam:        5.60 cm  2.66 cm/m  RA Area:     43.30 cm LA Vol (A2C):   151.0 ml 71.82 ml/m RA Volume:   193.00 ml 91.80 ml/m LA Vol (A4C):   76.0 ml  36.15 ml/m LA Biplane Vol: 109.0 ml 51.84 ml/m  AORTIC VALVE                   PULMONIC VALVE AV Area (Vmax):    2.08 cm    PV Vmax:        0.85 m/s AV Area (Vmean):   2.17 cm    PV Peak grad:   2.9 mmHg AV Area (VTI):     2.15 cm    RVOT Peak grad: 5 mmHg AV Vmax:           123.50 cm/s AV Vmean:          93.150 cm/s AV VTI:            0.227 m AV Peak Grad:      6.1 mmHg AV Mean Grad:      4.0 mmHg LVOT Vmax:         81.60 cm/s LVOT Vmean:        64.300 cm/s LVOT VTI:          0.155 m LVOT/AV VTI ratio: 0.68  AORTA Ao Root diam: 3.10 cm MITRAL VALVE                TRICUSPID VALVE MV Area (PHT): 4.04 cm  TR Peak grad:   39.9 mmHg MV Decel Time: 188 msec     TR Vmax:        316.00 cm/s MV E velocity: 100.00 cm/s                             SHUNTS                             Systemic VTI:  0.16 m                             Systemic Diam: 2.00 cm Ida Rogue MD Electronically signed by Ida Rogue MD Signature Date/Time: 04/07/2020/4:58:28 PM    Final    DG Femur Min 2 Views Right  Result Date: 04/06/2020 CLINICAL DATA:  Recent fall with right hip pain, initial encounter EXAM: RIGHT FEMUR 2 VIEWS COMPARISON:  None. FINDINGS: Degenerative changes of the right hip joint are noted. No acute fracture or dislocation is seen. No soft tissue abnormalities are noted. Mild  degenerative changes of the knee joint are seen as well. IMPRESSION: No acute abnormality noted. Electronically Signed   By: Inez Catalina M.D.   On: 04/06/2020 16:20    Blood pressure (!) 157/78, pulse 82, temperature 98.6 F (37 C), resp. rate 13, height '5\' 6"'$  (1.676 m), weight 102.1 kg, SpO2 93 %.  Assessment 1. Displaced closed calcaneal avulsion fracture right foot involving Achilles tendon 2. Uncontrolled diabetes type 2 polyneuropathy 3. Urinary tract infection 4. History of stroke 5. History of polysubstance abuse  Plan -Patient seen and examined. -No tenting of the skin noted to the posterior aspect of the right heel.  No skin necrosis noted to the posterior aspect of the right heel.  Capillary fill time appears to be intact still to the posterior heel but notable bruising is noted to the area as well as swelling.  No obvious deformity noted to the posterior right heel. -Discussed treatment options with patient.  Believe that the best course of action right now is conservative care for the patient as she has uncontrolled diabetes and has a urinary tract infection.  Would not advise surgical intervention at this time due to complications of limb loss due to incision healing issues and potential infection. -Would recommend conservative care for this patient. -Reapplied Ace wrap today from forefoot to below knee. -Yesterday order placed for posterior splint application with lots of cast padding prior to placing the splint with the foot held in equinus or plantarflexed position to try to reduce the calcaneal fracture and reduce tension at the posterior aspect of the heel.  Patient should keep this splint on for the next week until further evaluation. -If unable to place splint then patient needs to be in a walking boot but still remain nonweightbearing to left lower extremity all times. We will place an order for a cam boot for now but patient needs to have posterior splint placed. Discussed  with internist as well as nursing staff. -PT order in place, nonweightbearing at all times to the right lower extremity  Podiatry team will follow more peripherally at this time with the appropriate discharge instructions and follow-up information  Caroline More, DPM 04/08/2020, 1:30 PM

## 2020-04-08 NOTE — Progress Notes (Signed)
PROGRESS NOTE    Sandra Massey  [id number] DOB: Mar 19, 1950 DOA: 04/06/2020 PCP: [name], MD    Assessment & Plan:   Principal Problem:   Closed right calcaneal fracture Active Problems:   Chronic a-fib ([place])   Weakness   Essential hypertension   Heel fracture   Closed right calcaneal fracture: continue w/ non-weight bearing of right foot. Continue w/ compressive sleeve. MRI ankle shows acute avulsion fx of the calcaneal tuberosity at the achilles insertion, tibial tenosynovitis, OA & lateral hindfoot impingement. Not a good surgical candidate currently as per podiatry. Continue w/ conservative management w/ posterior splint application. Therapy recs SNF.  Elevated troponins: minimally elevated, likely secondary to demand ischemia. Echo shows EF [id number], diastolic parameters are indeterminate & mild hypokinesis of the basal regions. Minimal change in echo from March 2021  UTI: urine cx is growing e.coli, sens pending. Continue on IV ceftriaxone   Sepsis: r/o. Non-toxic appearing   DM2: poorly controlled w/ HbA1c 11.3. Continue on lantus, SSI w/ accuchecks  Chronic a. fib: continue on amiodarone. Will restart eliquis    Hx of CVA: with known right hemiparesis. Will f/u outpatient w/ neuro  Depression: severity unknown. Continue on home dose of sertraline   Morbid Obesity: complicates overall care and prognosis. BMI 36.3   DVT prophylaxis: heparin  Code Status: full  Family Communication:  Discussed pt's care w/ pt's sister, [name], and answered her questions Disposition Plan:  Likely d/c to SNF   Level of care: Med-Surg   Status is: Inpatient  Remains inpatient appropriate because:Unsafe d/c plan and IV treatments appropriate due to intensity of illness or inability to take PO   Dispo: The patient is from: Home              Anticipated d/c is to: SNF              Anticipated d/c date is: 2 days              Patient currently is not medically stable to  d/c.   Difficult to place patient Yes          Consultants:   Podiatry   Procedures:    Antimicrobials: ceftriaxone    Subjective: Pt c/o right foot still   Objective: Vitals:   04/07/20 1208 04/07/20 1729 04/07/20 2119 04/08/20 0429  BP: (!) 115/55 137/83 (!) 142/74 131/73  Pulse: 72 62 82 73  Resp: '20 20 17 17  '$ Temp: 98.2 F (36.8 C) 98.7 F (37.1 C) 98.4 F (36.9 C) 98.1 F (36.7 C)  TempSrc:      SpO2: 95% 94% 94% 94%  Weight:      Height:        Intake/Output Summary (Last 24 hours) at 04/08/2020 0732 Last data filed at 04/08/2020 0435 Gross per 24 hour  Intake --  Output 1450 ml  Net -1450 ml   Filed Weights   04/06/20 1501  Weight: 102.1 kg    Examination:  General exam: Appears calm & comfortable. Difficulty talking  Respiratory system: clear breath sounds b/l.  Cardiovascular system: S1/S2+. No rubs or gallops Gastrointestinal system: Abd is soft, NT, ND, & hypoactive bowel sounds  Central nervous system: Alert and awake. Psychiatry: Judgement and insight appear normal. Flat mood and affect    Data Reviewed: I have personally reviewed following labs and imaging studies  CBC: Recent Labs  Lab 04/06/20 1503 04/07/20 0047 04/08/20 0501  WBC 12.8* 9.0 8.9  HGB 13.1 12.5 11.8*  HCT 40.8 39.8 36.3  MCV 89.7 90.7 90.1  PLT 224 199 0000000   Basic Metabolic Panel: Recent Labs  Lab 04/06/20 1503 04/07/20 0047 04/08/20 0501  NA 138 138 139  K 4.6 4.4 4.5  CL 100 98 103  CO2 '29 27 27  '$ GLUCOSE 205* 351* 221*  BUN 28* 25* 23  CREATININE 1.07* 1.15* 1.02*  CALCIUM 9.7 9.7 9.4   GFR: Estimated Creatinine Clearance: 63.7 mL/min (A) (by C-G formula based on SCr of 1.02 mg/dL (H)). Liver Function Tests: Recent Labs  Lab 04/06/20 1503  AST 30  ALT 32  ALKPHOS 100  BILITOT 1.0  PROT 8.0  ALBUMIN 3.7   No results for input(s): LIPASE, AMYLASE in the last 168 hours. No results for input(s): AMMONIA in the last 168  hours. Coagulation Profile: Recent Labs  Lab 04/06/20 2203  INR 1.3*   Cardiac Enzymes: No results for input(s): CKTOTAL, CKMB, CKMBINDEX, TROPONINI in the last 168 hours. BNP (last 3 results) No results for input(s): PROBNP in the last 8760 hours. HbA1C: Recent Labs    04/06/20 2203  HGBA1C 11.3*   CBG: Recent Labs  Lab 04/07/20 0041 04/07/20 0821 04/07/20 1204 04/07/20 1725 04/07/20 2222  GLUCAP 301* 256* 345* 267* 284*   Lipid Profile: No results for input(s): CHOL, HDL, LDLCALC, TRIG, CHOLHDL, LDLDIRECT in the last 72 hours. Thyroid Function Tests: Recent Labs    04/06/20 2203  TSH 3.578   Anemia Panel: No results for input(s): VITAMINB12, FOLATE, FERRITIN, TIBC, IRON, RETICCTPCT in the last 72 hours. Sepsis Labs: No results for input(s): PROCALCITON, LATICACIDVEN in the last 168 hours.  Recent Results (from the past 240 hour(s))  SARS CORONAVIRUS 2 (TAT 6-24 HRS) Nasopharyngeal Nasopharyngeal Swab     Status: None   Collection Time: 04/06/20  8:28 PM   Specimen: Nasopharyngeal Swab  Result Value Ref Range Status   SARS Coronavirus 2 NEGATIVE NEGATIVE Final    Comment: (NOTE) SARS-CoV-2 target nucleic acids are NOT DETECTED.  The SARS-CoV-2 RNA is generally detectable in upper and lower respiratory specimens during the acute phase of infection. Negative results do not preclude SARS-CoV-2 infection, do not rule out co-infections with other pathogens, and should not be used as the sole basis for treatment or other patient management decisions. Negative results must be combined with clinical observations, patient history, and epidemiological information. The expected result is Negative.  Fact Sheet for Patients: SugarRoll.be  Fact Sheet for Healthcare Providers: https://www.woods-mathews.com/  This test is not yet approved or cleared by the [place] FDA and  has been authorized for detection and/or  diagnosis of SARS-CoV-2 by FDA under an Emergency Use Authorization (EUA). This EUA will remain  in effect (meaning this test can be used) for the duration of the COVID-19 declaration under Se ction 564(b)(1) of the Act, 21 U.S.C. section 360bbb-3(b)(1), unless the authorization is terminated or revoked sooner.  Performed at [facility], [place] [address], [place], [place] [address]   CULTURE, BLOOD (ROUTINE X 2) w Reflex to ID Panel     Status: None (Preliminary result)   Collection Time: 04/06/20 10:40 PM   Specimen: BLOOD  Result Value Ref Range Status   Specimen Description BLOOD LEFT FOREARM  Final   Special Requests   Final    BOTTLES DRAWN AEROBIC AND ANAEROBIC Blood Culture adequate volume   Culture   Final    NO GROWTH < 12 HOURS Performed at [facility], [place],  [place] [address]    Report Status PENDING  Incomplete  CULTURE, BLOOD (ROUTINE X 2) w Reflex to ID Panel     Status: None (Preliminary result)   Collection Time: 04/06/20 10:41 PM   Specimen: BLOOD  Result Value Ref Range Status   Specimen Description BLOOD LEFT HAND  Final   Special Requests   Final    BOTTLES DRAWN AEROBIC AND ANAEROBIC Blood Culture adequate volume   Culture   Final    NO GROWTH < 12 HOURS Performed at [facility], [address], [place], [place] [address]    Report Status PENDING  Incomplete  MRSA PCR Screening     Status: None   Collection Time: 04/07/20  8:56 AM   Specimen: Nasopharyngeal  Result Value Ref Range Status   MRSA by PCR NEGATIVE NEGATIVE Final    Comment:        The GeneXpert MRSA Assay (FDA approved for NASAL specimens only), is one component of a comprehensive MRSA colonization surveillance program. It is not intended to diagnose MRSA infection nor to guide or monitor treatment for MRSA infections. Performed at [facility], [address], [place], [place] [address]          Radiology Studies: DG  Chest 2 View  Result Date: 04/06/2020 CLINICAL DATA:  Status post fall. EXAM: CHEST - 2 VIEW COMPARISON:  Chest radiograph May 31, 2019. FINDINGS: Stable enlarged cardiac and mediastinal contours. Aortic atherosclerosis. Bilateral mid lower lung patchy airspace opacities. No pleural effusion or pneumothorax. Thoracic spine degenerative changes. IMPRESSION: Bilateral mid and lower lung patchy opacities which may represent atelectasis or infection. Electronically Signed   By: Lovey Newcomer M.D.   On: 04/06/2020 18:59   DG Shoulder Right  Result Date: 04/06/2020 CLINICAL DATA:  Recent fall with right shoulder pain, initial encounter EXAM: RIGHT SHOULDER - 2+ VIEW COMPARISON:  None. FINDINGS: Degenerative changes of the acromioclavicular and glenohumeral joints are seen. The humeral head is high-riding likely related to chronic rotator cuff injury. The underlying bony thorax appears within normal limits. No other focal abnormality is seen. IMPRESSION: Degenerative change without acute abnormality. Electronically Signed   By: Inez Catalina M.D.   On: 04/06/2020 16:22   DG Tibia/Fibula Right  Result Date: 04/06/2020 CLINICAL DATA:  Recent fall with right leg pain, initial encounter EXAM: RIGHT TIBIA AND FIBULA - 2 VIEW COMPARISON:  None. FINDINGS: Tibia and fibula are within normal limits. There is an avulsion from the superior aspect of the calcaneus at the level of the Achilles insertion. No other focal abnormality is seen. IMPRESSION: Avulsion from the superior aspect of the calcaneus. Tibia and fibula are within normal limits. Electronically Signed   By: Inez Catalina M.D.   On: 04/06/2020 16:22   CT Head Wo Contrast  Result Date: 04/06/2020 CLINICAL DATA:  Patient status post fall. EXAM: CT HEAD WITHOUT CONTRAST TECHNIQUE: Contiguous axial images were obtained from the base of the skull through the vertex without intravenous contrast. COMPARISON:  Brain CT 03/17/2019. FINDINGS: Brain: Ventricles and sulci  are appropriate for patient's age. Redemonstrated encephalomalacia from chronic left MCA territory infarct. No evidence for acute cortically based infarct, intracranial hemorrhage, mass lesion or mass-effect. Vascular: Unremarkable. Skull: Intact. Sinuses/Orbits: Paranasal sinuses are well aerated. Mastoid air cells are unremarkable. Orbits are unremarkable. Other: None. IMPRESSION: 1. No acute intracranial process. 2. Old left MCA territory infarct. Electronically Signed   By: Lovey Newcomer M.D.   On: 04/06/2020 16:00   DG  Pelvis Portable  Result Date: 04/06/2020 CLINICAL DATA:  [place], right-sided pain EXAM: PORTABLE PELVIS 1-2 VIEWS COMPARISON:  04/06/2020 FINDINGS: Supine frontal view of the pelvis demonstrates no acute displaced fractures. Enthesopathic changes are seen at the greater trochanters bilaterally, right greater than left. Symmetrical bilateral hip osteoarthritis. No subluxation or dislocation. There is mild sclerosis of the left SI joint. No erosive changes. Prominent degenerative changes at the lumbosacral junction. IMPRESSION: 1. No acute displaced fracture. If hip fracture remains a concern, follow-up CT or MRI could be considered. 2. Degenerative changes as above. Electronically Signed   By: Randa Ngo M.D.   On: 04/06/2020 17:02   MR FOOT RIGHT WO CONTRAST  Result Date: 04/07/2020 CLINICAL DATA:  [place], right foot pain EXAM: MRI OF THE RIGHT FOREFOOT WITHOUT CONTRAST TECHNIQUE: Multiplanar, multisequence MR imaging of the right forefoot was performed. No intravenous contrast was administered. COMPARISON:  None. FINDINGS: Bones/Joint/Cartilage Evaluation is limited due to patient pain. Unable to utilize dedicated foot coil, limiting resolution of the exam. There is diffuse edema within the third proximal phalanx. Cortical discontinuity within the proximal medial aspect of the third proximal phalanx could reflect fracture or osteomyelitis. There is mild marrow edema within the heads of the  second third metatarsals, without evidence of cortical disruption. Remaining bony structures demonstrate normal signal characteristics. Ligaments No gross abnormality on this limited exam. Muscles and Tendons Small amount of fluid within the flexor tendon sheaths consistent with tenosynovitis. Otherwise no gross abnormalities on this limited exam. Soft tissues Mild subcutaneous edema within the plantar aspect of the forefoot at the level of the second and third metatarsal heads. No fluid collection. No evidence of soft tissue defect. IMPRESSION: 1. Diffuse edema within the third proximal phalanx, with cortical discontinuity in the medial proximal aspect. Based on history, this is most consistent with fracture. 2. Mild edema within the heads of the second and third metatarsals, without clear cortical discontinuity. This could reflect contusion in the setting of trauma. 3. Mild soft tissue edema plantar aspect of the forefoot. No fluid collection or hematoma. 4. Fluid within the flexor tendon sheaths consistent with mild tenosynovitis. Electronically Signed   By: Randa Ngo M.D.   On: 04/07/2020 00:02   MR ANKLE RIGHT WO CONTRAST  Result Date: 04/07/2020 CLINICAL DATA:  Right ankle pain after fall. EXAM: MRI OF THE RIGHT ANKLE WITHOUT CONTRAST TECHNIQUE: Multiplanar, multisequence MR imaging of the ankle was performed. No intravenous contrast was administered. COMPARISON:  MRI right foot from yesterday. Right tibia and fibula x-rays from yesterday. FINDINGS: TENDONS Peroneal: Peroneal longus tendon intact. Peroneal brevis intact. Posteromedial: Posterior tibial tendon intact with small amount of fluid in the tendon sheath. Flexor digitorum longus tendon intact. Flexor hallucis longus tendon intact. Anterior: Tibialis anterior tendon intact. Extensor hallucis longus tendon intact Extensor digitorum longus tendon intact. Achilles:  Intact. Plantar Fascia: Intact. Mild thickening of the proximal central band.  LIGAMENTS Lateral: Anterior talofibular ligament intact. Calcaneofibular ligament intact. Posterior talofibular ligament intact. Anterior and posterior tibiofibular ligaments intact. Medial: Deltoid ligament intact. Spring ligament intact. CARTILAGE Ankle Joint: No significant joint effusion. Normal ankle mortise. Diffuse cartilage thinning without focal defect. Small marginal osteophytes. Subtalar Joints/Sinus Tarsi: Small posterior subtalar joint marginal osteophytes with trace joint fluid communicating with the sinus tarsi. Bones: Acute avulsion fracture of the calcaneal tuberosity at the Achilles insertion with up to 1.1 cm distraction. Subcortical cystic change in the lateral talar process and adjacent calcaneus. Soft Tissue: Mild diffuse soft tissue swelling. No soft  tissue mass or fluid collection. IMPRESSION: 1. Acute avulsion fracture of the calcaneal tuberosity at the Achilles insertion with up to 1.1 cm distraction. 2. Mild posterior tibial tenosynovitis. 3. Mild tibiotalar and posterior subtalar osteoarthritis. 4. Evidence of lateral hindfoot impingement. Electronically Signed   By: Titus Dubin M.D.   On: 04/07/2020 14:12   ECHOCARDIOGRAM COMPLETE  Result Date: 04/07/2020    ECHOCARDIOGRAM REPORT   Patient Name:   Sandra Massey Date of Exam: 04/07/2020 Medical Rec #:  [id number]    Height:       66.0 in Accession #:    [id number]   Weight:       225.0 lb Date of Birth:  [birth year]   BSA:          2.102 m Patient Age:    70 years     BP:           115/55 mmHg Patient Gender: F            HR:           72 bpm. Exam Location:  [facility] Procedure: 2D Echo, Color Doppler and Cardiac Doppler Indications:     Elevated troponin  History:         Patient has prior history of Echocardiogram examinations, most                  recent 06/02/2019. Risk Factors:Hypertension and Diabetes.                  Substance abuse.  Sonographer:     Sherrie Sport RDCS (AE) Referring Phys:  [id number] AMY N [facility] Diagnosing Phys:  Ida Rogue MD IMPRESSIONS  1. Left ventricular ejection fraction, by estimation, is 50 to 55%. The left ventricle has low normal function. Mild hypokinesis of the basal regions. Left ventricular diastolic parameters are indeterminate.  2. Right ventricular systolic function is normal. The right ventricular size is normal.  3. Left atrial size was moderately dilated.  4. The mitral valve is normal in structure. Mild mitral valve regurgitation. No evidence of mitral stenosis.  5. Tricuspid valve regurgitation is moderate. FINDINGS  Left Ventricle: Left ventricular ejection fraction, by estimation, is 50 to 55%. The left ventricle has low normal function. The left ventricle has no regional wall motion abnormalities. The left ventricular internal cavity size was normal in size. There is no left ventricular hypertrophy. Left ventricular diastolic parameters are indeterminate. Right Ventricle: The right ventricular size is normal. No increase in right ventricular wall thickness. Right ventricular systolic function is normal. Left Atrium: Left atrial size was moderately dilated. Right Atrium: Right atrial size was normal in size. Pericardium: There is no evidence of pericardial effusion. Mitral Valve: The mitral valve is normal in structure. Mild mitral valve regurgitation. No evidence of mitral valve stenosis. Tricuspid Valve: The tricuspid valve is normal in structure. Tricuspid valve regurgitation is moderate . No evidence of tricuspid stenosis. Aortic Valve: The aortic valve was not well visualized. Aortic valve regurgitation is not visualized. No aortic stenosis is present. Aortic valve mean gradient measures 4.0 mmHg. Aortic valve peak gradient measures 6.1 mmHg. Aortic valve area, by VTI measures 2.15 cm. Pulmonic Valve: The pulmonic valve was normal in structure. Pulmonic valve regurgitation is not visualized. No evidence of pulmonic stenosis. Aorta: The aortic root is normal in size and structure. Venous: The  inferior vena cava is normal in size with greater than 50% respiratory variability, suggesting right atrial pressure of 3 mmHg. IAS/Shunts:  No atrial level shunt detected by color flow Doppler.  LEFT VENTRICLE PLAX 2D LVIDd:         5.47 cm LVIDs:         3.89 cm LV PW:         0.99 cm LV IVS:        0.97 cm LVOT diam:     2.00 cm LV SV:         49 LV SV Index:   23 LVOT Area:     3.14 cm  RIGHT VENTRICLE RV Basal diam:  3.95 cm RV S prime:     9.90 cm/s TAPSE (M-mode): 3.2 cm LEFT ATRIUM              Index       RIGHT ATRIUM           Index LA diam:        5.60 cm  2.66 cm/m  RA Area:     43.30 cm LA Vol (A2C):   151.0 ml 71.82 ml/m RA Volume:   193.00 ml 91.80 ml/m LA Vol (A4C):   76.0 ml  36.15 ml/m LA Biplane Vol: 109.0 ml 51.84 ml/m  AORTIC VALVE                   PULMONIC VALVE AV Area (Vmax):    2.08 cm    PV Vmax:        0.85 m/s AV Area (Vmean):   2.17 cm    PV Peak grad:   2.9 mmHg AV Area (VTI):     2.15 cm    RVOT Peak grad: 5 mmHg AV Vmax:           123.50 cm/s AV Vmean:          93.150 cm/s AV VTI:            0.227 m AV Peak Grad:      6.1 mmHg AV Mean Grad:      4.0 mmHg LVOT Vmax:         81.60 cm/s LVOT Vmean:        64.300 cm/s LVOT VTI:          0.155 m LVOT/AV VTI ratio: 0.68  AORTA Ao Root diam: 3.10 cm MITRAL VALVE                TRICUSPID VALVE MV Area (PHT): 4.04 cm     TR Peak grad:   39.9 mmHg MV Decel Time: 188 msec     TR Vmax:        316.00 cm/s MV E velocity: 100.00 cm/s                             SHUNTS                             Systemic VTI:  0.16 m                             Systemic Diam: 2.00 cm Ida Rogue MD Electronically signed by Ida Rogue MD Signature Date/Time: 04/07/2020/4:58:28 PM    Final    DG Femur Min 2 Views Right  Result Date: 04/06/2020 CLINICAL DATA:  Recent fall with right hip pain, initial encounter EXAM: RIGHT FEMUR 2 VIEWS COMPARISON:  None. FINDINGS: Degenerative changes  of the right hip joint are noted. No acute fracture or  dislocation is seen. No soft tissue abnormalities are noted. Mild degenerative changes of the knee joint are seen as well. IMPRESSION: No acute abnormality noted. Electronically Signed   By: Inez Catalina M.D.   On: 04/06/2020 16:20        Scheduled Meds: . amiodarone  200 mg Oral Daily  . furosemide  20 mg Oral Daily  . heparin  5,000 Units Subcutaneous Q8H  . insulin aspart  0-20 Units Subcutaneous TID WC  . insulin aspart  0-5 Units Subcutaneous QHS  . insulin glargine  32 Units Subcutaneous Daily  . rosuvastatin  40 mg Oral Daily  . sertraline  50 mg Oral Daily  . spironolactone  25 mg Oral Daily   Continuous Infusions: . cefTRIAXone (ROCEPHIN)  IV 2 g (04/07/20 1729)     LOS: 1 day    Time spent: 30 mins     Wyvonnia Dusky, MD [facility] Pager [phone]  If 7PM-7AM, please contact night-coverage 04/08/2020, 7:32 AM

## 2020-04-08 NOTE — NC FL2 (Signed)
[place] LEVEL OF CARE SCREENING TOOL     IDENTIFICATION  Patient Name: Sandra Massey Birthdate: May 08, 1950 Sex: female Admission Date (Current Location): 04/06/2020  [place] and [place] Number:  Engineering geologist and Address:  [facility], [address], [place], [place] [address]      Provider Number: [id number]  Attending Physician Name and Address:  Wyvonnia Dusky, MD  Relative Name and Phone Number:  [name] [phone]    Current Level of Care: Hospital Recommended Level of Care: [place] Prior Approval Number:    Date Approved/Denied:   PASRR Number: [id number]  Discharge Plan: SNF    Current Diagnoses: Patient Active Problem List   Diagnosis Date Noted  . Heel fracture 04/07/2020  . Closed right calcaneal fracture 04/06/2020  . Essential hypertension 04/06/2020  . Pressure injury of skin 06/05/2019  . Weakness   . Sepsis with acute renal failure and septic shock ([place]) 06/01/2019  . Acute kidney injury (AKI) with acute tubular necrosis (ATN) (HCC)   . Lactic acidosis   . Chronic a-fib ([place])   . Kidney stones   . DKA (diabetic ketoacidoses) 05/31/2019    Orientation RESPIRATION BLADDER Height & Weight     Self,Place  Normal Incontinent Weight: 225 lb (102.1 kg) Height:  '5\' 6"'$  (167.6 cm)  BEHAVIORAL SYMPTOMS/MOOD NEUROLOGICAL BOWEL NUTRITION STATUS      Continent Diet  AMBULATORY STATUS COMMUNICATION OF NEEDS Skin   Extensive Assist Verbally Normal                       Personal Care Assistance Level of Assistance  Bathing,Feeding,Dressing Bathing Assistance: Limited assistance Feeding assistance: Limited assistance Dressing Assistance: Maximum assistance     Functional Limitations Info  Sight,Hearing,Speech Sight Info: Adequate Hearing Info: Adequate Speech Info: Impaired    SPECIAL CARE FACTORS FREQUENCY  PT (By licensed PT),OT (By licensed OT)     PT Frequency: 5x  week OT Frequency: 5x week            Contractures Contractures Info: Not present    Additional Factors Info  Code Status,Allergies Code Status Info: DNR Allergies Info: Latex           Current Medications (04/08/2020):  This is the current hospital active medication list Current Facility-Administered Medications  Medication Dose Route Frequency Provider Last Rate Last Admin  . acetaminophen (TYLENOL) tablet 325 mg  325 mg Oral Q6H PRN Cox, Amy N, DO       Or  . acetaminophen (TYLENOL) suppository 325 mg  325 mg Rectal Q6H PRN Cox, Amy N, DO      . amiodarone (PACERONE) tablet 200 mg  200 mg Oral Daily Wyvonnia Dusky, MD   200 mg at 04/08/20 0943  . apixaban (ELIQUIS) tablet 5 mg  5 mg Oral BID Wyvonnia Dusky, MD   5 mg at 04/08/20 1158  . cefTRIAXone (ROCEPHIN) 2 g in sodium chloride 0.9 % 100 mL IVPB  2 g Intravenous Q24H Wyvonnia Dusky, MD 200 mL/hr at 04/07/20 1729 2 g at 04/07/20 1729  . furosemide (LASIX) tablet 20 mg  20 mg Oral Daily Wyvonnia Dusky, MD   20 mg at 04/08/20 0942  . insulin aspart (novoLOG) injection 0-20 Units  0-20 Units Subcutaneous TID WC Cox, Amy N, DO   11 Units at 04/08/20 1153  . insulin aspart (novoLOG) injection 0-5 Units  0-5 Units Subcutaneous QHS Cox, Amy N,  DO   3 Units at 04/07/20 2227  . insulin glargine (LANTUS) injection 32 Units  32 Units Subcutaneous Daily Renda Rolls, RPH   32 Units at 04/08/20 [id number]  . morphine 2 MG/ML injection 1 mg  1 mg Intravenous Q2H PRN Cox, Amy N, DO   1 mg at 04/08/20 1110  . ondansetron (ZOFRAN) tablet 4 mg  4 mg Oral Q6H PRN Cox, Amy N, DO       Or  . ondansetron (ZOFRAN) injection 4 mg  4 mg Intravenous Q6H PRN Cox, Amy N, DO      . rosuvastatin (CRESTOR) tablet 40 mg  40 mg Oral Daily Cox, Amy N, DO   40 mg at 04/08/20 0952  . sertraline (ZOLOFT) tablet 50 mg  50 mg Oral Daily Cox, Amy N, DO   50 mg at 04/08/20 0944  . spironolactone (ALDACTONE) tablet 25 mg  25 mg Oral Daily Wyvonnia Dusky, MD   25 mg at 04/08/20 [id number]     Discharge Medications: Please see discharge summary for a list of discharge medications.  Relevant Imaging Results:  Relevant Lab Results:   Additional Information SS: [id number]  [name], LCSW

## 2020-04-08 NOTE — Progress Notes (Signed)
Physical Therapy Treatment Patient Details Name: Sandra Massey MRN: [id number] DOB: 1950/08/29 Today's Date: 04/08/2020    History of Present Illness Pt is a 70 y/o F admitted on 04/06/20 with c/c of R sided leg pain after a fall. Pt found to have a R calcaneal fx. PMH: L MC infarct with R hemiparesis, chronic a-fib, IDDM, HTN, obesity    PT Comments    Pt seen for PT treatment with pt performing RLE exercises as noted below with instructional cuing for technique for RLE strengthening. Pt is able to perform bed mobility with +1 (max/total) assist on this date with heavy reliance on hospital bed features. Pt with improving static sitting balance EOB & is able to wash face with set up assist with washcloth. Pt continues to complain of pain in RLE as well as R shoulder/side of body (since her fall) & nurse made aware. Will continue to follow pt acutely & initiate transfers, potentially with slide board, as pt is able to tolerate & with +2 assist.   Follow Up Recommendations  SNF     Equipment Recommendations  None recommended by PT    Recommendations for Other Services       Precautions / Restrictions Precautions Precautions: Fall Precaution Comments: R hemi (UE>LE) Restrictions Weight Bearing Restrictions: Yes RLE Weight Bearing: Non weight bearing    Mobility  Bed Mobility Overal bed mobility: Needs Assistance Bed Mobility: Supine to Sit;Rolling;Sit to Supine Rolling: Max assist   Supine to sit: Max assist;Total assist;HOB elevated Sit to supine: Max assist;Total assist;HOB elevated   General bed mobility comments: Pt requires max/total +1 assist for supine<>sit with max multimodal cuing for sequencing/technique. Pt with improving ability to use bed rails to assist with supine>sit. Pt requires +2 to scoot to [facility].  Transfers                    Ambulation/Gait                 Stairs             Wheelchair Mobility    Modified Rankin (Stroke  Patients Only)       Balance Overall balance assessment: Needs assistance Sitting-balance support: Feet supported;Single extremity supported Sitting balance-Leahy Scale: Fair Sitting balance - Comments: close supervision/CGA sitting EOB, less posterior lean when performing exercises seated EOB on this date                                    Cognition Arousal/Alertness: Awake/alert Behavior During Therapy:  (labile/tearful) Overall Cognitive Status: Difficult to assess                                        Exercises General Exercises - Lower Extremity Short Arc Quad: AROM;Right;10 reps;Supine Long Arc Quad: AROM;Right;10 reps;Seated;Left Heel Slides: AROM;Right;10 reps;Supine Hip ABduction/ADduction: AROM;Both;10 reps;Supine (performs hip adduction pillow squeezes AROM, performs hip abduction with AAROM) Straight Leg Raises:  (unable 2/2 pain)    General Comments        Pertinent Vitals/Pain Pain Assessment: Faces Faces Pain Scale: Hurts whole lot Pain Location: RLE, R shoulder, R side of body Pain Descriptors / Indicators: Crying;Grimacing;Guarding Pain Intervention(s): Monitored during session;Patient requesting pain meds-RN notified;Repositioned    Home Living  Prior Function            PT Goals (current goals can now be found in the care plan section) Acute Rehab PT Goals Patient Stated Goal: pt unable to state PT Goal Formulation: Patient unable to participate in goal setting Additional Goals Additional Goal #1: Pt will propel w/c 100 ft with Mod I. Progress towards PT goals: Progressing toward goals    Frequency    7X/week      PT Plan Current plan remains appropriate    Co-evaluation              AM-PAC PT "6 Clicks" Mobility   Outcome Measure  Help needed turning from your back to your side while in a flat bed without using bedrails?: A Lot Help needed moving from lying on your  back to sitting on the side of a flat bed without using bedrails?: Total Help needed moving to and from a bed to a chair (including a wheelchair)?: Total Help needed standing up from a chair using your arms (e.g., wheelchair or bedside chair)?: Total Help needed to walk in hospital room?: Total Help needed climbing 3-5 steps with a railing? : Total 6 Click Score: 7    End of Session   Activity Tolerance: Patient limited by pain Patient left: in bed;with call bell/phone within reach;with bed alarm set   PT Visit Diagnosis: Muscle weakness (generalized) (M62.81);Difficulty in walking, not elsewhere classified (R26.2);Pain Pain - Right/Left: Right Pain - part of body: Ankle and joints of foot     Time: [id number] PT Time Calculation (min) (ACUTE ONLY): 26 min  Charges:  $Therapeutic Exercise: 8-22 mins $Therapeutic Activity: 8-22 mins                     Lavone Nian, PT, DPT 04/08/20, 8:53 AM    Waunita Schooner 04/08/2020, 8:51 AM

## 2020-04-09 DIAGNOSIS — N390 Urinary tract infection, site not specified: Secondary | ICD-10-CM | POA: Diagnosis not present

## 2020-04-09 DIAGNOSIS — E1169 Type 2 diabetes mellitus with other specified complication: Secondary | ICD-10-CM | POA: Diagnosis not present

## 2020-04-09 DIAGNOSIS — S92031A Displaced avulsion fracture of tuberosity of right calcaneus, initial encounter for closed fracture: Secondary | ICD-10-CM | POA: Diagnosis not present

## 2020-04-09 LAB — BASIC METABOLIC PANEL
Anion gap: 9 (ref 5–15)
BUN: 27 mg/dL — ABNORMAL HIGH (ref 8–23)
CO2: 29 mmol/L (ref 22–32)
Calcium: 9.6 mg/dL (ref 8.9–10.3)
Chloride: 98 mmol/L (ref 98–111)
Creatinine, Ser: 1.06 mg/dL — ABNORMAL HIGH (ref 0.44–1.00)
GFR, Estimated: 57 mL/min — ABNORMAL LOW (ref >60)
Glucose, Bld: 271 mg/dL — ABNORMAL HIGH (ref 70–99)
Potassium: 4.5 mmol/L (ref 3.5–5.1)
Sodium: 136 mmol/L (ref 135–145)

## 2020-04-09 LAB — CBC
HCT: 38.5 % (ref 36.0–46.0)
Hemoglobin: 12.6 g/dL (ref 12.0–15.0)
MCH: 28.8 pg (ref 26.0–34.0)
MCHC: 32.7 g/dL (ref 30.0–36.0)
MCV: 88.1 fL (ref 80.0–100.0)
Platelets: 195 10*3/uL (ref 150–400)
RBC: 4.37 MIL/uL (ref 3.87–5.11)
RDW: 13.8 % (ref 11.5–15.5)
WBC: 8.7 10*3/uL (ref 4.0–10.5)
nRBC: 0 % (ref 0.0–0.2)

## 2020-04-09 LAB — GLUCOSE, CAPILLARY
Glucose-Capillary: 255 mg/dL — ABNORMAL HIGH (ref 70–99)
Glucose-Capillary: 356 mg/dL — ABNORMAL HIGH (ref 70–99)
Glucose-Capillary: 359 mg/dL — ABNORMAL HIGH (ref 70–99)
Glucose-Capillary: 260 mg/dL — ABNORMAL HIGH (ref 70–99)

## 2020-04-09 NOTE — Progress Notes (Signed)
Physical Therapy Treatment Patient Details Name: Sandra Massey MRN: [id number] DOB: June 11, 1950 Today's Date: 04/09/2020    History of Present Illness Pt is a 70 y/o F admitted on 04/06/20 with c/c of R sided leg pain after a fall. Pt found to have a R calcaneal fx. PMH: L MC infarct with R hemiparesis, chronic a-fib, IDDM, HTN, obesity    PT Comments    Pt very anxious upon arrival and eventually is able to convey she needs to have a BM.  Rolling to right with rail and max a x 1.  Bedpan placed.  She does have a large soft BM and care provided.  She requires mod a x 1 to maintain side lying for care.  Fatigued with effort.  She is able to continue on to do supine LE ex as below but is fatigue from bed mobility so further mobility is deferred.   Follow Up Recommendations  SNF     Equipment Recommendations  None recommended by PT    Recommendations for Other Services       Precautions / Restrictions Precautions Precautions: Fall Precaution Comments: R hemi (UE>LE) Restrictions Weight Bearing Restrictions: Yes RLE Weight Bearing: Non weight bearing    Mobility  Bed Mobility Overal bed mobility: Needs Assistance Bed Mobility: Rolling Rolling: Max assist            Transfers                    Ambulation/Gait                 Stairs             Wheelchair Mobility    Modified Rankin (Stroke Patients Only)       Balance                                            Cognition Arousal/Alertness: Awake/alert Behavior During Therapy: Anxious Overall Cognitive Status: Difficult to assess                                        Exercises Other Exercises Other Exercises: BLE AROM LLE, AAROM RLE x 10 supine    General Comments        Pertinent Vitals/Pain Pain Assessment: Faces Faces Pain Scale: Hurts even more Pain Location: RLE, R shoulder, R side of body Pain Descriptors / Indicators:  Crying;Grimacing;Guarding Pain Intervention(s): Limited activity within patient's tolerance;Monitored during session;Repositioned    Home Living                      Prior Function            PT Goals (current goals can now be found in the care plan section) Progress towards PT goals: Progressing toward goals    Frequency    7X/week      PT Plan Current plan remains appropriate    Co-evaluation              AM-PAC PT "6 Clicks" Mobility   Outcome Measure  Help needed turning from your back to your side while in a flat bed without using bedrails?: A Lot Help needed moving from lying on your back to sitting on the side of a flat bed without  using bedrails?: Total Help needed moving to and from a bed to a chair (including a wheelchair)?: Total Help needed standing up from a chair using your arms (e.g., wheelchair or bedside chair)?: Total Help needed to walk in hospital room?: Total Help needed climbing 3-5 steps with a railing? : Total 6 Click Score: 7    End of Session   Activity Tolerance: Patient limited by fatigue;Patient limited by pain Patient left: in bed;with call bell/phone within reach;with bed alarm set Nurse Communication: Mobility status;Weight bearing status Pain - Right/Left: Right Pain - part of body: Ankle and joints of foot     Time: 0840-0909 PT Time Calculation (min) (ACUTE ONLY): 29 min  Charges:  $Therapeutic Exercise: 8-22 mins $Therapeutic Activity: 8-22 mins                    Tinlee Sessum, PTA 04/09/20, 9:31 AM

## 2020-04-09 NOTE — Progress Notes (Signed)
PROGRESS NOTE    Sandra Massey  [id number] DOB: 1950-05-09 DOA: 04/06/2020 PCP: [name], MD    Assessment & Plan:   Principal Problem:   Closed right calcaneal fracture Active Problems:   Chronic a-fib ([place])   Weakness   Essential hypertension   Heel fracture   Closed right calcaneal fracture: still w/ pain in right foot. Continue w/ non-weight bearing of right foot. MRI ankle shows acute avulsion fx of the calcaneal tuberosity at the achilles insertion, tibial tenosynovitis, OA & lateral hindfoot impingement. Not a good surgical candidate currently as per podiatry. Continue w/ conservative management w/ posterior splint application. PT/OT recs SNF  Elevated troponins: minimally elevated, likely secondary to demand ischemia. Echo shows EF [id number], diastolic parameters are indeterminate & mild hypokinesis of the basal regions. Minimal change in echo from March 2021  UTI: continue on IV ceftriaxone. Urine cx is growing e. coli, sens pending still   Sepsis: r/o. Non-toxic appearing   DM2: HbA1c 11.3, poorly controlled. 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 Will continue to monitor   Chronic a. fib: continue on eliquis, amio   Hx of CVA: with known right hemiparesis. Will f/u outpatient w/ neuro  Depression: severity unknown. Continue on home dose of sertraline    Morbid Obesity: BMI 36.3. Complicates overall care and prognosis    DVT prophylaxis: heparin  Code Status: full  Family Communication:  Disposition Plan:  Likely d/c to SNF   Level of care: Med-Surg   Status is: Inpatient  Remains inpatient appropriate because:Unsafe d/c plan and IV treatments appropriate due to intensity of illness or inability to take PO, awaiting urine cxs to be finalized & for SNF placement    Dispo: The patient is from: Home              Anticipated d/c is to: SNF              Anticipated d/c date is: 2 days              Patient  currently is not medically stable to d/c.   Difficult to place patient Yes    Consultants:   Podiatry   Procedures:    Antimicrobials: ceftriaxone    Subjective: Pt c/o fatigue  Objective: Vitals:   04/08/20 1528 04/08/20 2139 04/09/20 0024 04/09/20 0548  BP: (!) 150/114 (!) 154/81 (!) 153/79 129/73  Pulse: 77 78 81 73  Resp: '18 18 19 16  '$ Temp: 98.2 F (36.8 C) 97.9 F (36.6 C) 99 F (37.2 C) 97.8 F (36.6 C)  TempSrc:      SpO2: 92% 94% 92% 91%  Weight:      Height:        Intake/Output Summary (Last 24 hours) at 04/09/2020 0721 Last data filed at 04/09/2020 0548 Gross per 24 hour  Intake 600 ml  Output 3070 ml  Net -2470 ml   Filed Weights   04/06/20 1501  Weight: 102.1 kg    Examination:  General exam: Appears comfortable. Obese. Difficulty talking  Respiratory system: clear breath sounds b/l  Cardiovascular system: S1/S2+. No rubs or gallops Gastrointestinal system: Abd is soft, NT, ND & hypoactive  Central nervous system: Alert and awake. Psychiatry: Judgement and insight appear normal. Flat mood and affect    Data Reviewed: I have personally reviewed following labs and imaging studies  CBC: Recent Labs  Lab 04/06/20 1503 04/07/20 0047 04/08/20 0501 04/09/20 0520  WBC 12.8* 9.0  8.9 8.7  HGB 13.1 12.5 11.8* 12.6  HCT 40.8 39.8 36.3 38.5  MCV 89.7 90.7 90.1 88.1  PLT 224 199 181 0000000   Basic Metabolic Panel: Recent Labs  Lab 04/06/20 1503 04/07/20 0047 04/08/20 0501 04/09/20 0520  NA 138 138 139 136  K 4.6 4.4 4.5 4.5  CL 100 98 103 98  CO2 '29 27 27 29  '$ GLUCOSE 205* 351* 221* 271*  BUN 28* 25* 23 27*  CREATININE 1.07* 1.15* 1.02* 1.06*  CALCIUM 9.7 9.7 9.4 9.6   GFR: Estimated Creatinine Clearance: 61.3 mL/min (A) (by C-G formula based on SCr of 1.06 mg/dL (H)). Liver Function Tests: Recent Labs  Lab 04/06/20 1503  AST 30  ALT 32  ALKPHOS 100  BILITOT 1.0  PROT 8.0  ALBUMIN 3.7   No results for input(s): LIPASE,  AMYLASE in the last 168 hours. No results for input(s): AMMONIA in the last 168 hours. Coagulation Profile: Recent Labs  Lab 04/06/20 2203  INR 1.3*   Cardiac Enzymes: No results for input(s): CKTOTAL, CKMB, CKMBINDEX, TROPONINI in the last 168 hours. BNP (last 3 results) No results for input(s): PROBNP in the last 8760 hours. HbA1C: Recent Labs    04/06/20 2203  HGBA1C 11.3*   CBG: Recent Labs  Lab 04/07/20 2222 04/08/20 0831 04/08/20 1138 04/08/20 1634 04/08/20 2133  GLUCAP 284* 180* 288* 288* 330*   Lipid Profile: No results for input(s): CHOL, HDL, LDLCALC, TRIG, CHOLHDL, LDLDIRECT in the last 72 hours. Thyroid Function Tests: Recent Labs    04/06/20 2203  TSH 3.578   Anemia Panel: No results for input(s): VITAMINB12, FOLATE, FERRITIN, TIBC, IRON, RETICCTPCT in the last 72 hours. Sepsis Labs: No results for input(s): PROCALCITON, LATICACIDVEN in the last 168 hours.  Recent Results (from the past 240 hour(s))  SARS CORONAVIRUS 2 (TAT 6-24 HRS) Nasopharyngeal Nasopharyngeal Swab     Status: None   Collection Time: 04/06/20  8:28 PM   Specimen: Nasopharyngeal Swab  Result Value Ref Range Status   SARS Coronavirus 2 NEGATIVE NEGATIVE Final    Comment: (NOTE) SARS-CoV-2 target nucleic acids are NOT DETECTED.  The SARS-CoV-2 RNA is generally detectable in upper and lower respiratory specimens during the acute phase of infection. Negative results do not preclude SARS-CoV-2 infection, do not rule out co-infections with other pathogens, and should not be used as the sole basis for treatment or other patient management decisions. Negative results must be combined with clinical observations, patient history, and epidemiological information. The expected result is Negative.  Fact Sheet for Patients: SugarRoll.be  Fact Sheet for Healthcare Providers: https://www.woods-mathews.com/  This test is not yet approved or cleared  by the [place] FDA and  has been authorized for detection and/or diagnosis of SARS-CoV-2 by FDA under an Emergency Use Authorization (EUA). This EUA will remain  in effect (meaning this test can be used) for the duration of the COVID-19 declaration under Se ction 564(b)(1) of the Act, 21 U.S.C. section 360bbb-3(b)(1), unless the authorization is terminated or revoked sooner.  Performed at [facility], [place] [address] [place], [place] [address]   Urine Culture     Status: Abnormal (Preliminary result)   Collection Time: 04/06/20 10:10 PM   Specimen: Urine, Clean Catch  Result Value Ref Range Status   Specimen Description   Final    URINE, CLEAN CATCH Performed at [facility], [address], [place], [place] [address]    Special Requests   Final  NONE Performed at [facility], [address], [place], [place] [address]    Culture (A)  Final    >=100,000 COLONIES/mL ESCHERICHIA COLI SUSCEPTIBILITIES TO FOLLOW Performed at [facility] [address] [place], [place] [address]    Report Status PENDING  Incomplete  CULTURE, BLOOD (ROUTINE X 2) w Reflex to ID Panel     Status: None (Preliminary result)   Collection Time: 04/06/20 10:40 PM   Specimen: BLOOD  Result Value Ref Range Status   Specimen Description BLOOD LEFT FOREARM  Final   Special Requests   Final    BOTTLES DRAWN AEROBIC AND ANAEROBIC Blood Culture adequate volume   Culture   Final    NO GROWTH 2 DAYS Performed at [facility], [address], [place], [place] [address]    Report Status PENDING  Incomplete  CULTURE, BLOOD (ROUTINE X 2) w Reflex to ID Panel     Status: None (Preliminary result)   Collection Time: 04/06/20 10:41 PM   Specimen: BLOOD  Result Value Ref Range Status   Specimen Description BLOOD LEFT HAND  Final   Special Requests   Final    BOTTLES DRAWN AEROBIC AND ANAEROBIC Blood Culture adequate volume   Culture   Final    NO GROWTH 2  DAYS Performed at [facility], [address], [place], [place] [address]    Report Status PENDING  Incomplete  MRSA PCR Screening     Status: None   Collection Time: 04/07/20  8:56 AM   Specimen: Nasopharyngeal  Result Value Ref Range Status   MRSA by PCR NEGATIVE NEGATIVE Final    Comment:        The GeneXpert MRSA Assay (FDA approved for NASAL specimens only), is one component of a comprehensive MRSA colonization surveillance program. It is not intended to diagnose MRSA infection nor to guide or monitor treatment for MRSA infections. Performed at [facility], [address], [place], [place] [address]          Radiology Studies: MR ANKLE RIGHT WO CONTRAST  Result Date: 04/07/2020 CLINICAL DATA:  Right ankle pain after fall. EXAM: MRI OF THE RIGHT ANKLE WITHOUT CONTRAST TECHNIQUE: Multiplanar, multisequence MR imaging of the ankle was performed. No intravenous contrast was administered. COMPARISON:  MRI right foot from yesterday. Right tibia and fibula x-rays from yesterday. FINDINGS: TENDONS Peroneal: Peroneal longus tendon intact. Peroneal brevis intact. Posteromedial: Posterior tibial tendon intact with small amount of fluid in the tendon sheath. Flexor digitorum longus tendon intact. Flexor hallucis longus tendon intact. Anterior: Tibialis anterior tendon intact. Extensor hallucis longus tendon intact Extensor digitorum longus tendon intact. Achilles:  Intact. Plantar Fascia: Intact. Mild thickening of the proximal central band. LIGAMENTS Lateral: Anterior talofibular ligament intact. Calcaneofibular ligament intact. Posterior talofibular ligament intact. Anterior and posterior tibiofibular ligaments intact. Medial: Deltoid ligament intact. Spring ligament intact. CARTILAGE Ankle Joint: No significant joint effusion. Normal ankle mortise. Diffuse cartilage thinning without focal defect. Small marginal osteophytes. Subtalar Joints/Sinus Tarsi: Small posterior  subtalar joint marginal osteophytes with trace joint fluid communicating with the sinus tarsi. Bones: Acute avulsion fracture of the calcaneal tuberosity at the Achilles insertion with up to 1.1 cm distraction. Subcortical cystic change in the lateral talar process and adjacent calcaneus. Soft Tissue: Mild diffuse soft tissue swelling. No soft tissue mass or fluid collection. IMPRESSION: 1. Acute avulsion fracture of the calcaneal tuberosity at the Achilles insertion with up to 1.1 cm distraction. 2. Mild posterior tibial tenosynovitis.  3. Mild tibiotalar and posterior subtalar osteoarthritis. 4. Evidence of lateral hindfoot impingement. Electronically Signed   By: Titus Dubin M.D.   On: 04/07/2020 14:12   ECHOCARDIOGRAM COMPLETE  Result Date: 04/07/2020    ECHOCARDIOGRAM REPORT   Patient Name:   ABIHA IM Date of Exam: 04/07/2020 Medical Rec #:  [id number]    Height:       66.0 in Accession #:    [id number]   Weight:       225.0 lb Date of Birth:  [birth year]   BSA:          2.102 m Patient Age:    70 years     BP:           115/55 mmHg Patient Gender: F            HR:           72 bpm. Exam Location:  [facility] Procedure: 2D Echo, Color Doppler and Cardiac Doppler Indications:     Elevated troponin  History:         Patient has prior history of Echocardiogram examinations, most                  recent 06/02/2019. Risk Factors:Hypertension and Diabetes.                  Substance abuse.  Sonographer:     Sherrie Sport RDCS (AE) Referring Phys:  [id number] AMY N COX Diagnosing Phys: Ida Rogue MD IMPRESSIONS  1. Left ventricular ejection fraction, by estimation, is 50 to 55%. The left ventricle has low normal function. Mild hypokinesis of the basal regions. Left ventricular diastolic parameters are indeterminate.  2. Right ventricular systolic function is normal. The right ventricular size is normal.  3. Left atrial size was moderately dilated.  4. The mitral valve is normal in structure. Mild mitral valve  regurgitation. No evidence of mitral stenosis.  5. Tricuspid valve regurgitation is moderate. FINDINGS  Left Ventricle: Left ventricular ejection fraction, by estimation, is 50 to 55%. The left ventricle has low normal function. The left ventricle has no regional wall motion abnormalities. The left ventricular internal cavity size was normal in size. There is no left ventricular hypertrophy. Left ventricular diastolic parameters are indeterminate. Right Ventricle: The right ventricular size is normal. No increase in right ventricular wall thickness. Right ventricular systolic function is normal. Left Atrium: Left atrial size was moderately dilated. Right Atrium: Right atrial size was normal in size. Pericardium: There is no evidence of pericardial effusion. Mitral Valve: The mitral valve is normal in structure. Mild mitral valve regurgitation. No evidence of mitral valve stenosis. Tricuspid Valve: The tricuspid valve is normal in structure. Tricuspid valve regurgitation is moderate . No evidence of tricuspid stenosis. Aortic Valve: The aortic valve was not well visualized. Aortic valve regurgitation is not visualized. No aortic stenosis is present. Aortic valve mean gradient measures 4.0 mmHg. Aortic valve peak gradient measures 6.1 mmHg. Aortic valve area, by VTI measures 2.15 cm. Pulmonic Valve: The pulmonic valve was normal in structure. Pulmonic valve regurgitation is not visualized. No evidence of pulmonic stenosis. Aorta: The aortic root is normal in size and structure. Venous: The inferior vena cava is normal in size with greater than 50% respiratory variability, suggesting right atrial pressure of 3 mmHg. IAS/Shunts: No atrial level shunt detected by color flow Doppler.  LEFT VENTRICLE PLAX 2D LVIDd:         5.47 cm LVIDs:  3.89 cm LV PW:         0.99 cm LV IVS:        0.97 cm LVOT diam:     2.00 cm LV SV:         49 LV SV Index:   23 LVOT Area:     3.14 cm  RIGHT VENTRICLE RV Basal diam:  3.95 cm  RV S prime:     9.90 cm/s TAPSE (M-mode): 3.2 cm LEFT ATRIUM              Index       RIGHT ATRIUM           Index LA diam:        5.60 cm  2.66 cm/m  RA Area:     43.30 cm LA Vol (A2C):   151.0 ml 71.82 ml/m RA Volume:   193.00 ml 91.80 ml/m LA Vol (A4C):   76.0 ml  36.15 ml/m LA Biplane Vol: 109.0 ml 51.84 ml/m  AORTIC VALVE                   PULMONIC VALVE AV Area (Vmax):    2.08 cm    PV Vmax:        0.85 m/s AV Area (Vmean):   2.17 cm    PV Peak grad:   2.9 mmHg AV Area (VTI):     2.15 cm    RVOT Peak grad: 5 mmHg AV Vmax:           123.50 cm/s AV Vmean:          93.150 cm/s AV VTI:            0.227 m AV Peak Grad:      6.1 mmHg AV Mean Grad:      4.0 mmHg LVOT Vmax:         81.60 cm/s LVOT Vmean:        64.300 cm/s LVOT VTI:          0.155 m LVOT/AV VTI ratio: 0.68  AORTA Ao Root diam: 3.10 cm MITRAL VALVE                TRICUSPID VALVE MV Area (PHT): 4.04 cm     TR Peak grad:   39.9 mmHg MV Decel Time: 188 msec     TR Vmax:        316.00 cm/s MV E velocity: 100.00 cm/s                             SHUNTS                             Systemic VTI:  0.16 m                             Systemic Diam: 2.00 cm Ida Rogue MD Electronically signed by Ida Rogue MD Signature Date/Time: 04/07/2020/4:58:28 PM    Final         Scheduled Meds: . amiodarone  200 mg Oral Daily  . apixaban  5 mg Oral BID  . furosemide  20 mg Oral Daily  . insulin aspart  0-20 Units Subcutaneous TID WC  . insulin aspart  0-5 Units Subcutaneous QHS  . insulin glargine  32 Units Subcutaneous Daily  . rosuvastatin  40 mg Oral  Daily  . sertraline  50 mg Oral Daily  . spironolactone  25 mg Oral Daily   Continuous Infusions: . cefTRIAXone (ROCEPHIN)  IV 2 g (04/08/20 1758)     LOS: 2 days    Time spent: 30 mins     Wyvonnia Dusky, MD [facility] Pager [phone]  If 7PM-7AM, please contact night-coverage 04/09/2020, 7:21 AM

## 2020-04-10 DIAGNOSIS — N1831 Chronic kidney disease, stage 3a: Secondary | ICD-10-CM

## 2020-04-10 DIAGNOSIS — S92031A Displaced avulsion fracture of tuberosity of right calcaneus, initial encounter for closed fracture: Secondary | ICD-10-CM | POA: Diagnosis not present

## 2020-04-10 DIAGNOSIS — N390 Urinary tract infection, site not specified: Secondary | ICD-10-CM | POA: Diagnosis not present

## 2020-04-10 LAB — URINE CULTURE: Culture: >=100000 — AB

## 2020-04-10 LAB — GLUCOSE, CAPILLARY
Glucose-Capillary: 255 mg/dL — ABNORMAL HIGH (ref 70–99)
Glucose-Capillary: 440 mg/dL — ABNORMAL HIGH (ref 70–99)
Glucose-Capillary: 412 mg/dL — ABNORMAL HIGH (ref 70–99)
Glucose-Capillary: 472 mg/dL — ABNORMAL HIGH (ref 70–99)

## 2020-04-10 LAB — BASIC METABOLIC PANEL
Anion gap: 11 (ref 5–15)
BUN: 38 mg/dL — ABNORMAL HIGH (ref 8–23)
CO2: 27 mmol/L (ref 22–32)
Calcium: 9.9 mg/dL (ref 8.9–10.3)
Chloride: 95 mmol/L — ABNORMAL LOW (ref 98–111)
Creatinine, Ser: 1.41 mg/dL — ABNORMAL HIGH (ref 0.44–1.00)
GFR, Estimated: 41 mL/min — ABNORMAL LOW (ref >60)
Glucose, Bld: 210 mg/dL — ABNORMAL HIGH (ref 70–99)
Potassium: 5.1 mmol/L (ref 3.5–5.1)
Sodium: 133 mmol/L — ABNORMAL LOW (ref 135–145)

## 2020-04-10 LAB — CBC
HCT: 41.6 % (ref 36.0–46.0)
Hemoglobin: 13.7 g/dL (ref 12.0–15.0)
MCH: 29.3 pg (ref 26.0–34.0)
MCHC: 32.9 g/dL (ref 30.0–36.0)
MCV: 88.9 fL (ref 80.0–100.0)
Platelets: 222 10*3/uL (ref 150–400)
RBC: 4.68 MIL/uL (ref 3.87–5.11)
RDW: 13.9 % (ref 11.5–15.5)
WBC: 11.9 10*3/uL — ABNORMAL HIGH (ref 4.0–10.5)
nRBC: 0 % (ref 0.0–0.2)

## 2020-04-10 LAB — GLUCOSE, RANDOM: Glucose, Bld: 447 mg/dL — ABNORMAL HIGH (ref 70–99)

## 2020-04-10 MED ORDER — INSULIN ASPART PROT & ASPART (70-30 MIX) 100 UNIT/ML ~~LOC~~ SUSP
20.0000 [IU] | Freq: Once | SUBCUTANEOUS | Status: AC
  Administered 2020-04-10: 20 [IU] via SUBCUTANEOUS
  Filled 2020-04-10: qty 10

## 2020-04-10 MED ORDER — INSULIN GLARGINE 100 UNIT/ML ~~LOC~~ SOLN
35.0000 [IU] | Freq: Every day | SUBCUTANEOUS | Status: DC
  Administered 2020-04-11 – 2020-04-13 (×3): 35 [IU] via SUBCUTANEOUS
  Filled 2020-04-10 (×3): qty 0.35

## 2020-04-10 MED ORDER — INSULIN ASPART 100 UNIT/ML ~~LOC~~ SOLN
5.0000 [IU] | Freq: Three times a day (TID) | SUBCUTANEOUS | Status: DC
  Administered 2020-04-11 – 2020-04-13 (×7): 5 [IU] via SUBCUTANEOUS
  Filled 2020-04-10 (×7): qty 1

## 2020-04-10 MED ORDER — INSULIN ASPART 100 UNIT/ML ~~LOC~~ SOLN
25.0000 [IU] | Freq: Once | SUBCUTANEOUS | Status: AC
  Administered 2020-04-10: 25 [IU] via SUBCUTANEOUS
  Filled 2020-04-10: qty 1

## 2020-04-10 MED ORDER — SODIUM CHLORIDE 0.9 % IV SOLN
INTRAVENOUS | Status: DC | PRN
  Administered 2020-04-10: 500 mL via INTRAVENOUS

## 2020-04-10 NOTE — Progress Notes (Signed)
Pt Blood sugar 472. Hassan Rowan Morrison/NP notified. New orders received.

## 2020-04-10 NOTE — Progress Notes (Signed)
Inpatient Diabetes Program Recommendations  AACE/ADA: New Consensus Statement on Inpatient Glycemic Control (2015)  Target Ranges:  Prepandial:   less than 140 mg/dL      Peak postprandial:   less than 180 mg/dL (1-2 hours)      Critically ill patients:  140 - 180 mg/dL   Lab Results  Component Value Date   GLUCAP 440 (H) 04/10/2020   HGBA1C 11.3 (H) 04/06/2020    Review of Glycemic Control Results for LUCI, REI (MRN [id number]) as of 04/10/2020 12:45  Ref. Range 04/09/2020 12:30 04/09/2020 17:38 04/09/2020 21:22 04/10/2020 08:18 04/10/2020 11:39  Glucose-Capillary Latest Ref Range: 70 - 99 mg/dL 356 (H) 359 (H) 260 (H) 255 (H) 440 (H)   Home DM Meds: Novolog 7 units TID with meals                             Lantus 32 units Daily  Current Orders: Lantus 32 units Daily                            Novolog Resistant Correction Scale/ SSI (0-20 units) TID AC + HS  Inpatient Diabetes Program Recommendations:   Consider: -Increase Lantus to 35 units qd -Add Novolog 5 units tid meal coverage if eats 50% Secure chat to D. [place].  Thank you, Nani Gasser. Macey Wurtz, RN, MSN, CDE  Diabetes Coordinator Inpatient Glycemic Control Team Team Pager [phone] (8am-5pm) 04/10/2020 12:46 PM

## 2020-04-10 NOTE — TOC Progression Note (Addendum)
Transition of Care [facility]) - Progression Note    Patient Details  Name: Sandra Massey MRN: [id number] Date of Birth: March 18, 1950  [facility] CM/SW Contact  Shelbie Ammons, RN Phone Number: 04/10/2020, 11:25 AM  Clinical Narrative:   RNCM reached out to [facility] with [facility] to ask that he look at bed request.   2:25: RNCM left message for patient's sister [name] to discuss possibility of extending bed search due to lack of available bed offers. Left VM for return call.   2:50: RNCM received bed offer from [facility], they are extending bed offer and starting insurance authorization.     Expected Discharge Plan: [place] Barriers to Discharge: Continued Medical Work up  Expected Discharge Plan and Services Expected Discharge Plan: [place] arrangements for the past 2 months: Single Family Home                                       Social Determinants of Health (SDOH) Interventions    Readmission Risk Interventions No flowsheet data found.

## 2020-04-10 NOTE — Progress Notes (Signed)
Physical Therapy Treatment Patient Details Name: Sandra Massey MRN: [id number] DOB: 06-08-1950 Today's Date: 04/10/2020    History of Present Illness Pt is a 70 y/o F admitted on 04/06/20 with c/c of R sided leg pain after a fall. Pt found to have a R calcaneal fx. PMH: L MC infarct with R hemiparesis, chronic a-fib, IDDM, HTN, obesity    PT Comments    Participated in exercises as described below.  To EOB with max a x 2.  Once up she is able to sit with intermittent min assist x 5 minutes.  She attempts to stand but is reminded on NWB R leg and shook her head as she remembered.  When asked if she thought she could do that she shook her head no.  Pt is however appropriate for [facility] lift transfers if needed for OOB at this time. With prior CVA she is unable to safely attempt standing with WB status at this time.  Pt works hard during session.  She does have aphasia from CVA which makes communication challenging  but with time and patience understanding pt is achievable. Word finding challenges are more pronounced when she is frustrated.  When at ease, communication is improved.   Follow Up Recommendations  SNF     Equipment Recommendations  None recommended by PT    Recommendations for Other Services       Precautions / Restrictions Precautions Precautions: Fall Precaution Comments: R hemi (UE>LE) Restrictions Weight Bearing Restrictions: Yes RLE Weight Bearing: Non weight bearing    Mobility  Bed Mobility Overal bed mobility: Needs Assistance Bed Mobility: Rolling Rolling: Max assist Sidelying to sit: Max assist;+2 for physical assistance Supine to sit: Max assist;+2 for physical assistance Sit to supine: Max assist;Total assist;HOB elevated      Transfers                    Ambulation/Gait                 Stairs             Wheelchair Mobility    Modified Rankin (Stroke Patients Only)       Balance Overall balance assessment: Needs  assistance Sitting-balance support: Feet supported;Single extremity supported Sitting balance-Leahy Scale: Poor Sitting balance - Comments: sat EOB x 5 minutes with intermittent support but overall does fair                                    Cognition Arousal/Alertness: Awake/alert Behavior During Therapy: Anxious Overall Cognitive Status: Within Functional Limits for tasks assessed                                        Exercises General Exercises - Lower Extremity Short Arc Quad: AROM;Right;10 reps;Supine Long Arc Quad: AROM;Right;10 reps;Seated;Left Heel Slides: AROM;Right;10 reps;Supine Hip ABduction/ADduction: AROM;Both;10 reps;Supine (performs hip adduction pillow squeezes AROM, performs hip abduction with AAROM) Straight Leg Raises:  (unable 2/2 pain)    General Comments        Pertinent Vitals/Pain Pain Assessment: Faces Faces Pain Scale: Hurts even more Pain Location: RLE, R shoulder, R side of body Pain Descriptors / Indicators: Crying;Grimacing;Guarding    Home Living  Prior Function            PT Goals (current goals can now be found in the care plan section) Progress towards PT goals: Progressing toward goals    Frequency    7X/week      PT Plan Current plan remains appropriate    Co-evaluation              AM-PAC PT "6 Clicks" Mobility   Outcome Measure  Help needed turning from your back to your side while in a flat bed without using bedrails?: A Lot Help needed moving from lying on your back to sitting on the side of a flat bed without using bedrails?: Total Help needed moving to and from a bed to a chair (including a wheelchair)?: Total Help needed standing up from a chair using your arms (e.g., wheelchair or bedside chair)?: Total Help needed to walk in hospital room?: Total Help needed climbing 3-5 steps with a railing? : Total 6 Click Score: 7    End of Session    Activity Tolerance: Patient tolerated treatment well Patient left: in bed;with call bell/phone within reach;with bed alarm set Nurse Communication: Mobility status;Weight bearing status Pain - Right/Left: Right Pain - part of body: Ankle and joints of foot     Time: 1348-1405 PT Time Calculation (min) (ACUTE ONLY): 17 min  Charges:  $Therapeutic Activity: 8-22 mins                    Stevanna Karren, PTA 04/10/20, 2:17 PM

## 2020-04-10 NOTE — Progress Notes (Signed)
PROGRESS NOTE    Sandra Massey  [id number] DOB: Nov 08, 1950 DOA: 04/06/2020 PCP: [name], MD    Assessment & Plan:   Principal Problem:   Closed right calcaneal fracture Active Problems:   Chronic a-fib ([place])   Weakness   Essential hypertension   Heel fracture   Closed right calcaneal fracture: improving slowly. Continue w/ non-weighting right bearing of RLE. MRI ankle shows acute avulsion fx of the calcaneal tuberosity at the achilles insertion, tibial tenosynovitis, OA & lateral hindfoot impingement. Not a good surgical candidate as per podiatry. Continue w/ conservative management w/ posterior splint application. PT/OT recs SNF  Elevated troponins: minimally elevated, likely secondary to demand ischemia. Echo shows EF [id number], diastolic parameters are indeterminate & mild hypokinesis of the basal regions. Minimal change in echo from March 2021  UTI: urine cx is growing e. coli, sens still pending. Continue on IV ceftriaxone   Sepsis: r/o. Non-toxic appearing   DM2: poorly controlled, HbA1c 11.3. 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 Hold home dose of spironolactone, lisinopril. Avoid nephrotoxic meds   Chronic a. fib: continue on amiodarone, eliquis   Hx of CVA: with known right hemiparesis. Will f/u outpatient w/ neuro  Depression: severity unknown. Continue on home dose of sertraline   Morbid Obesity: complicates overall care and prognosis. BMI 36.3   DVT prophylaxis: heparin  Code Status: full  Family Communication:  Disposition Plan:  Waiting on SNF placement   Level of care: Med-Surg   Status is: Inpatient  Remains inpatient appropriate because:Unsafe d/c plan and IV treatments appropriate due to intensity of illness or inability to take PO, awaiting urine cxs to be finalized & for SNF placement    Dispo: The patient is from: Home              Anticipated d/c is to: SNF              Anticipated d/c date is: 2  days              Patient currently is not medically stable to d/c.   Difficult to place patient Yes    Consultants:   Podiatry   Procedures:    Antimicrobials: ceftriaxone    Subjective: Pt c/o malaise  Objective: Vitals:   04/09/20 1600 04/09/20 2012 04/10/20 0005 04/10/20 0452  BP: (!) 143/74 119/74 134/90 122/75  Pulse: (!) 59 90 87 (!) 57  Resp: '17 16 16 17  '$ Temp: 98.4 F (36.9 C) 99.3 F (37.4 C) 98.3 F (36.8 C) 98.3 F (36.8 C)  TempSrc:  Oral Oral Oral  SpO2: 93% 92% 93% 91%  Weight:      Height:        Intake/Output Summary (Last 24 hours) at 04/10/2020 0717 Last data filed at 04/10/2020 0454 Gross per 24 hour  Intake 480 ml  Output 2200 ml  Net -1720 ml   Filed Weights   04/06/20 1501  Weight: 102.1 kg    Examination:  General exam: Appears comfortable & calm. Obese. Difficulty talking  Respiratory system: clear breath sounds b/l. No wheezes, rhonchi  Cardiovascular system: S1 & S2+. No rubs or clicks  Gastrointestinal system: Abd is soft, NT, ND & hypoactive  Central nervous system: Alert and awake. Psychiatry: Judgement and insight appear normal. Flat mood and affect    Data Reviewed: I have personally reviewed following labs and imaging studies  CBC: Recent Labs  Lab 04/06/20 1503 04/07/20 0047 04/08/20 0501  04/09/20 0520 04/10/20 0353  WBC 12.8* 9.0 8.9 8.7 11.9*  HGB 13.1 12.5 11.8* 12.6 13.7  HCT 40.8 39.8 36.3 38.5 41.6  MCV 89.7 90.7 90.1 88.1 88.9  PLT 224 199 181 195 [id number]   Basic Metabolic Panel: Recent Labs  Lab 04/06/20 1503 04/07/20 0047 04/08/20 0501 04/09/20 0520 04/10/20 0353  NA 138 138 139 136 133*  K 4.6 4.4 4.5 4.5 5.1  CL 100 98 103 98 95*  CO2 '29 27 27 29 27  '$ GLUCOSE 205* 351* 221* 271* 210*  BUN 28* 25* 23 27* 38*  CREATININE 1.07* 1.15* 1.02* 1.06* 1.41*  CALCIUM 9.7 9.7 9.4 9.6 9.9   GFR: Estimated Creatinine Clearance: 46.1 mL/min (A) (by C-G formula based on SCr of 1.41 mg/dL (H)). Liver  Function Tests: Recent Labs  Lab 04/06/20 1503  AST 30  ALT 32  ALKPHOS 100  BILITOT 1.0  PROT 8.0  ALBUMIN 3.7   No results for input(s): LIPASE, AMYLASE in the last 168 hours. No results for input(s): AMMONIA in the last 168 hours. Coagulation Profile: Recent Labs  Lab 04/06/20 2203  INR 1.3*   Cardiac Enzymes: No results for input(s): CKTOTAL, CKMB, CKMBINDEX, TROPONINI in the last 168 hours. BNP (last 3 results) No results for input(s): PROBNP in the last 8760 hours. HbA1C: No results for input(s): HGBA1C in the last 72 hours. CBG: Recent Labs  Lab 04/08/20 2133 04/09/20 0807 04/09/20 1230 04/09/20 1738 04/09/20 2122  GLUCAP 330* 255* 356* 359* 260*   Lipid Profile: No results for input(s): CHOL, HDL, LDLCALC, TRIG, CHOLHDL, LDLDIRECT in the last 72 hours. Thyroid Function Tests: No results for input(s): TSH, T4TOTAL, FREET4, T3FREE, THYROIDAB in the last 72 hours. Anemia Panel: No results for input(s): VITAMINB12, FOLATE, FERRITIN, TIBC, IRON, RETICCTPCT in the last 72 hours. Sepsis Labs: No results for input(s): PROCALCITON, LATICACIDVEN in the last 168 hours.  Recent Results (from the past 240 hour(s))  SARS CORONAVIRUS 2 (TAT 6-24 HRS) Nasopharyngeal Nasopharyngeal Swab     Status: None   Collection Time: 04/06/20  8:28 PM   Specimen: Nasopharyngeal Swab  Result Value Ref Range Status   SARS Coronavirus 2 NEGATIVE NEGATIVE Final    Comment: (NOTE) SARS-CoV-2 target nucleic acids are NOT DETECTED.  The SARS-CoV-2 RNA is generally detectable in upper and lower respiratory specimens during the acute phase of infection. Negative results do not preclude SARS-CoV-2 infection, do not rule out co-infections with other pathogens, and should not be used as the sole basis for treatment or other patient management decisions. Negative results must be combined with clinical observations, patient history, and epidemiological information. The expected result is  Negative.  Fact Sheet for Patients: SugarRoll.be  Fact Sheet for Healthcare Providers: https://www.woods-mathews.com/  This test is not yet approved or cleared by the [place] FDA and  has been authorized for detection and/or diagnosis of SARS-CoV-2 by FDA under an Emergency Use Authorization (EUA). This EUA will remain  in effect (meaning this test can be used) for the duration of the COVID-19 declaration under Se ction 564(b)(1) of the Act, 21 U.S.C. section 360bbb-3(b)(1), unless the authorization is terminated or revoked sooner.  Performed at [facility], [place] [address] [place], [place] [address]   Urine Culture     Status: Abnormal (Preliminary result)   Collection Time: 04/06/20 10:10 PM   Specimen: Urine, Clean Catch  Result Value Ref Range Status   Specimen Description   Final    URINE, CLEAN CATCH  Performed at [facility], [address], [place], [place] [address]    Special Requests   Final    NONE Performed at [facility], [address] [place], [place] [address]    Culture (A)  Final    >=100,000 COLONIES/mL ESCHERICHIA COLI REPEATING SENSITIVITY Performed at [facility], [place] [address], [place], [place] [address]    Report Status PENDING  Incomplete  CULTURE, BLOOD (ROUTINE X 2) w Reflex to ID Panel     Status: None (Preliminary result)   Collection Time: 04/06/20 10:40 PM   Specimen: BLOOD  Result Value Ref Range Status   Specimen Description BLOOD LEFT FOREARM  Final   Special Requests   Final    BOTTLES DRAWN AEROBIC AND ANAEROBIC Blood Culture adequate volume   Culture   Final    NO GROWTH 3 DAYS Performed at [facility], [address], [place], [place] [address]    Report Status PENDING  Incomplete  CULTURE, BLOOD (ROUTINE X 2) w Reflex to ID Panel     Status: None (Preliminary result)   Collection Time: 04/06/20 10:41 PM   Specimen: BLOOD  Result  Value Ref Range Status   Specimen Description BLOOD LEFT HAND  Final   Special Requests   Final    BOTTLES DRAWN AEROBIC AND ANAEROBIC Blood Culture adequate volume   Culture   Final    NO GROWTH 3 DAYS Performed at [facility], [address] [place], [place] [address]    Report Status PENDING  Incomplete  MRSA PCR Screening     Status: None   Collection Time: 04/07/20  8:56 AM   Specimen: Nasopharyngeal  Result Value Ref Range Status   MRSA by PCR NEGATIVE NEGATIVE Final    Comment:        The GeneXpert MRSA Assay (FDA approved for NASAL specimens only), is one component of a comprehensive MRSA colonization surveillance program. It is not intended to diagnose MRSA infection nor to guide or monitor treatment for MRSA infections. Performed at [facility], [address] [place], [place] [address]          Radiology Studies: No results found.      Scheduled Meds: . amiodarone  200 mg Oral Daily  . apixaban  5 mg Oral BID  . furosemide  20 mg Oral Daily  . insulin aspart  0-20 Units Subcutaneous TID WC  . insulin aspart  0-5 Units Subcutaneous QHS  . insulin glargine  32 Units Subcutaneous Daily  . rosuvastatin  40 mg Oral Daily  . sertraline  50 mg Oral Daily   Continuous Infusions: . cefTRIAXone (ROCEPHIN)  IV 2 g (04/09/20 1806)     LOS: 3 days    Time spent: 31 mins     Wyvonnia Dusky, MD [facility] Pager [phone]  If 7PM-7AM, please contact night-coverage 04/10/2020, 7:17 AM

## 2020-04-11 DIAGNOSIS — I10 Essential (primary) hypertension: Secondary | ICD-10-CM | POA: Diagnosis not present

## 2020-04-11 DIAGNOSIS — I482 Chronic atrial fibrillation, unspecified: Secondary | ICD-10-CM | POA: Diagnosis not present

## 2020-04-11 DIAGNOSIS — S92031A Displaced avulsion fracture of tuberosity of right calcaneus, initial encounter for closed fracture: Secondary | ICD-10-CM | POA: Diagnosis not present

## 2020-04-11 LAB — CBC
HCT: 42.6 % (ref 36.0–46.0)
Hemoglobin: 13.7 g/dL (ref 12.0–15.0)
MCH: 28.2 pg (ref 26.0–34.0)
MCHC: 32.2 g/dL (ref 30.0–36.0)
MCV: 87.7 fL (ref 80.0–100.0)
Platelets: 232 10*3/uL (ref 150–400)
RBC: 4.86 MIL/uL (ref 3.87–5.11)
RDW: 13.7 % (ref 11.5–15.5)
WBC: 9 10*3/uL (ref 4.0–10.5)
nRBC: 0 % (ref 0.0–0.2)

## 2020-04-11 LAB — CULTURE, BLOOD (ROUTINE X 2)
Culture: NO GROWTH
Culture: NO GROWTH
Special Requests: ADEQUATE
Special Requests: ADEQUATE

## 2020-04-11 LAB — GLUCOSE, CAPILLARY
Glucose-Capillary: 258 mg/dL — ABNORMAL HIGH (ref 70–99)
Glucose-Capillary: 392 mg/dL — ABNORMAL HIGH (ref 70–99)
Glucose-Capillary: 300 mg/dL — ABNORMAL HIGH (ref 70–99)
Glucose-Capillary: 297 mg/dL — ABNORMAL HIGH (ref 70–99)

## 2020-04-11 LAB — BASIC METABOLIC PANEL
Anion gap: 13 (ref 5–15)
BUN: 47 mg/dL — ABNORMAL HIGH (ref 8–23)
CO2: 31 mmol/L (ref 22–32)
Calcium: 10.4 mg/dL — ABNORMAL HIGH (ref 8.9–10.3)
Chloride: 90 mmol/L — ABNORMAL LOW (ref 98–111)
Creatinine, Ser: 1.37 mg/dL — ABNORMAL HIGH (ref 0.44–1.00)
GFR, Estimated: 42 mL/min — ABNORMAL LOW (ref >60)
Glucose, Bld: 267 mg/dL — ABNORMAL HIGH (ref 70–99)
Potassium: 4.9 mmol/L (ref 3.5–5.1)
Sodium: 134 mmol/L — ABNORMAL LOW (ref 135–145)

## 2020-04-11 LAB — SARS CORONAVIRUS 2 BY RT PCR (HOSPITAL ORDER, PERFORMED IN ~~LOC~~ HOSPITAL LAB): SARS Coronavirus 2: NEGATIVE

## 2020-04-11 MED ORDER — CIPROFLOXACIN HCL 500 MG PO TABS
500.0000 mg | ORAL_TABLET | Freq: Two times a day (BID) | ORAL | Status: DC
Start: 2020-04-11 — End: 2020-04-11
  Filled 2020-04-11: qty 1

## 2020-04-11 MED ORDER — POLYETHYLENE GLYCOL 3350 17 G PO PACK
17.0000 g | PACK | Freq: Every day | ORAL | Status: DC
  Administered 2020-04-11: 17 g via ORAL
  Filled 2020-04-11: qty 1

## 2020-04-11 MED ORDER — DOCUSATE SODIUM 100 MG PO CAPS
200.0000 mg | ORAL_CAPSULE | Freq: Two times a day (BID) | ORAL | Status: DC
  Administered 2020-04-11 – 2020-04-12 (×4): 200 mg via ORAL
  Filled 2020-04-11 (×4): qty 2

## 2020-04-11 MED ORDER — AMOXICILLIN 500 MG PO CAPS
500.0000 mg | ORAL_CAPSULE | Freq: Three times a day (TID) | ORAL | Status: DC
  Administered 2020-04-11 – 2020-04-13 (×6): 500 mg via ORAL
  Filled 2020-04-11 (×8): qty 1

## 2020-04-11 MED ORDER — AMOXICILLIN 500 MG PO CAPS: 500.0000 mg | ORAL_CAPSULE | Freq: Three times a day (TID) | ORAL | 0 refills | Status: AC

## 2020-04-11 NOTE — Progress Notes (Signed)
Physical Therapy Treatment Patient Details Name: Sandra Massey MRN: [id number] DOB: 03/02/1951 Today's Date: 04/11/2020    History of Present Illness Pt is a 70 y/o F admitted on 04/06/20 with c/c of R sided leg pain after a fall. Pt found to have a R calcaneal fx. PMH: L MC infarct with R hemiparesis, chronic a-fib, IDDM, HTN, obesity    PT Comments    Pt was long sitting in bed upon arriving. She is aphasic however able to get her thoughts across with increased time and assistance. She agrees to PT session and OOB activity. +2 assist to hoyer pt OOB to w/c. Most of PT session spent working with self propulsion of manual w/c. Would benefit from single arm (LUE) drive w/c. Required mod-max assist to make lap around RN station 160 ft. Pt did also sit EOB x a few minutes with +2 assist for safety. Overall pt tolerated session well and is progressing towards PT goals. Will need SNF at DC to address deficits while improving pt's independence with ADLs while decreasing caregiver burden. Pt was in bed with call bell in reach and bed alarm in place.    Follow Up Recommendations  SNF     Equipment Recommendations  None recommended by PT    Recommendations for Other Services       Precautions / Restrictions Precautions Precautions: Fall Precaution Comments: R hemi (UE>LE) Restrictions Weight Bearing Restrictions: Yes RLE Weight Bearing: Non weight bearing    Mobility  Bed Mobility Overal bed mobility: Needs Assistance Bed Mobility: Rolling Rolling: Max assist   Supine to sit: Max assist;+2 for safety/equipment;+2 for physical assistance Sit to supine: Max assist;+2 for physical assistance;+2 for safety/equipment      Transfers Overall transfer level: Needs assistance Equipment used:  (Hoyer lift)             General transfer comment: Pt was agreeable to OOB to w/c to word on w/c mobility. +2 for safety during hoyer transfer OOB to w/c. hoyer  pad left in  room.    Information systems manager mobility: Yes Wheelchair propulsion: Left upper extremity;Left lower extremity Wheelchair parts: Needs assistance Distance: 160 Wheelchair Assistance Details (indicate cue type and reason): Pt required contant assistance to progress in a straight course. tends to turn R throughout. MOd-max to safely go around RN station. would benifit from single arm drive WC.      Balance Overall balance assessment: Needs assistance Sitting-balance support: Single extremity supported (LLE support) Sitting balance-Leahy Scale: Poor Sitting balance - Comments: poor ability to maintain sitting balance with LLE/LUE and pillow support under RUE.       Cognition Arousal/Alertness: Awake/alert Behavior During Therapy: WFL for tasks assessed/performed (anxious when in hoyer lift) Overall Cognitive Status: Within Functional Limits for tasks assessed    General Comments: Pt is alert and cooperative throughout session. does get easily frustrated however easy to redirect and refocus. Very thankful for OOB activity.             Pertinent Vitals/Pain Pain Assessment: Faces Faces Pain Scale: Hurts little more Pain Location: RLE, R shoulder, R side of body Pain Descriptors / Indicators: Crying;Grimacing;Guarding Pain Intervention(s): Limited activity within patient's tolerance;Monitored during session;Premedicated before session;Repositioned           PT Goals (current goals can now be found in the care plan section) Acute Rehab PT Goals Patient Stated Goal: pt unable to state Progress towards PT goals: Progressing toward goals    Frequency  7X/week      PT Plan Current plan remains appropriate       AM-PAC PT "6 Clicks" Mobility   Outcome Measure  Help needed turning from your back to your side while in a flat bed without using bedrails?: A Lot Help needed moving from lying on your back to sitting on the side of a flat bed  without using bedrails?: Total Help needed moving to and from a bed to a chair (including a wheelchair)?: Total Help needed standing up from a chair using your arms (e.g., wheelchair or bedside chair)?: Total Help needed to walk in hospital room?: Total Help needed climbing 3-5 steps with a railing? : Total 6 Click Score: 7    End of Session Equipment Utilized During Treatment: Gait belt Activity Tolerance: Patient tolerated treatment well Patient left: in bed;with call bell/phone within reach;with bed alarm set Nurse Communication: Mobility status;Weight bearing status PT Visit Diagnosis: Muscle weakness (generalized) (M62.81);Difficulty in walking, not elsewhere classified (R26.2);Pain Pain - Right/Left: Right Pain - part of body: Ankle and joints of foot     Time: 1105-1156 PT Time Calculation (min) (ACUTE ONLY): 51 min  Charges:  $Therapeutic Activity: 8-22 mins $Wheel Chair Management: 23-37 mins                     Julaine Fusi PTA 04/11/20, 1:07 PM

## 2020-04-11 NOTE — Discharge Summary (Addendum)
Physician Discharge Summary  [name] [id number] DOB: March 26, 1950 DOA: 04/06/2020  PCP: [name], MD  Admit date: 04/06/2020 Discharge date: 04/12/2020  Admitted From: home Disposition:  SNF  Recommendations for Outpatient Follow-up:  1. Follow up with PCP in 1 week 2. F/u w/ podiatry, Dr. Mariel Kansky in 1-2 weeks  Home Health: no  Equipment/Devices:  Discharge Condition: stable.  Note, pt was discharged on 04/11/2020 by Dr. Wyvonnia Dusky, however, did not leave the hospital until 04/13/2020 due to needing to have a bowel movement prior to facility acceptance.  CODE STATUS: DNR Diet recommendation: Heart Healthy / Carb Modified   Brief/Interim Summary: HPI was taken from Dr. Tobie Poet: Sandra Massey is a 70 y.o. female with medical history significant for history of old left MCA territory infarct, with right-sided hemiparesis, chronic A. fib, insulin-dependent diabetes mellitus, hypertension, abdominal obesity, presented to the emergency department for chief concerns of mechanical fall.  At bedside, patient has chronic difficulty speaking.  She states that she fell twice today and endorses back pain and right foot pain.  She also endorses increased urination.  She denies fever, shortness of breath, chest pain, abdominal pain.  She states that she is a Jehovah witness and does not want blood transfusion.  She does not want chest compressions and intubation should she have cardiac arrest.  Hospital Course from Dr. Jimmye Norman 1/28-04/11/20: Pt was found to have a closed right calcaneal fracture secondary to a fall (present on admission). Pt is not currently a surgical candidate as per podiatry and has been treated conservatively w/ posterior splint. Pt is non-weight bearing of RLE. Of note, pt was found to have e.coli UTI and was initially given IV ceftriaxone and then switch to po amoxicillin when urine cx sens were finalized. PT/OT evaluated the pt and recommended SNF.  Discharge  Diagnoses:  Principal Problem:   Closed right calcaneal fracture Active Problems:   Chronic a-fib (HCC)   Weakness   Essential hypertension   Heel fracture  Closed right calcaneal fracture: has pain intermittently. Continue w/ non-weighting right bearing of RLE. MRI ankle shows acute avulsion fx of the calcaneal tuberosity at the achilles insertion, tibial tenosynovitis, OA & lateral hindfoot impingement. Not a good surgical candidate as per podiatry. Continue w/ conservative management w/ posterior splint application. PT/OT recs SNF.   Elevated troponins: minimally elevated, likely secondary to demand ischemia. Echo shows EF [id number], diastolic parameters are indeterminate & mild hypokinesis of the basal regions. Minimal change in echo from March 2021  UTI: urine cx is growing e. Coli. Switch to po amoxicillin as per urine cx results   Sepsis: r/o. Non-toxic appearing   DM2: poorly controlled, HbA1c 11.3. 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 Restart spironolactone, lisinopril. Avoid nephrotoxic meds   Chronic a. fib: continue on amiodarone, eliquis   Hx of CVA: with known right hemiparesis.Difficulty talking at baseline. Will f/u outpatient w/ neuro  Depression: severity unknown. Continue on home dose of sertraline   Morbid Obesity: complicates overall care and prognosis. BMI 36.3   Discharge Instructions  Discharge Instructions    Diet - low sodium heart healthy   Complete by: As directed    Discharge instructions   Complete by: As directed    F/u w/ podiatry, Dr. Mariel Kansky, in 1-2 weeks. F/u w/ PCP in 1 week   Increase activity slowly   Complete by: As directed    Non- weight bearing of RLE  Allergies as of 04/12/2020      Reactions   Latex Rash      Medication List    TAKE these medications   amiodarone 200 MG tablet Commonly known as: PACERONE Take 1 tablet (200 mg total) by mouth 2 (two) times daily. What changed:  when to take this Notes to patient: Last dose given 04/11/20 9:16am   amoxicillin 500 MG capsule Commonly known as: AMOXIL Take 1 capsule (500 mg total) by mouth every 8 (eight) hours for 2 days. Notes to patient: Not given while in hospital    Eliquis 5 MG Tabs tablet Generic drug: apixaban Take 5 mg by mouth 2 (two) times daily. Notes to patient: Last dose given 04/11/20 9:16am   furosemide 20 MG tablet Commonly known as: LASIX Take 20 mg by mouth daily. Notes to patient: Last dose given 04/11/20 9:20am   insulin aspart 100 UNIT/ML FlexPen Commonly known as: NOVOLOG Inject 20 Units into the skin 3 (three) times daily with meals. What changed: how much to take   insulin glargine 100 UNIT/ML Solostar Pen Commonly known as: LANTUS Inject 32 Units into the skin daily. What changed: how much to take Notes to patient: Last dose given 04/11/20 9:17am   Insulin Pen Needle 31G X 8 MM Misc 1 needle with each injection of insulin   lisinopril-hydrochlorothiazide 20-25 MG tablet Commonly known as: ZESTORETIC Take 1 tablet by mouth daily. Notes to patient: Not given while in hospital    multivitamin with minerals Tabs tablet Take 1 tablet by mouth daily. Notes to patient: Not given while in hospital    rosuvastatin 40 MG tablet Commonly known as: CRESTOR Take 40 mg by mouth daily. Notes to patient: Last dose given 04/11/20 9:16am   sertraline 50 MG tablet Commonly known as: ZOLOFT Take 50 mg by mouth daily. Notes to patient: Last dose given 04/11/20 9:16am   spironolactone 25 MG tablet Commonly known as: ALDACTONE Take 25 mg by mouth daily. Notes to patient: Last dose given 04/09/20 9:51am   VITA-C PO Take 1 tablet by mouth daily. Notes to patient: Not given while in hospital        Contact information for follow-up providers    Caroline More, DPM. Go on 04/19/2020.   Specialty: Podiatry Why: @ 1:45 pm Contact information: [place]  [phone]            Contact information for after-discharge care    Destination    [facility] Preferred SNF.   Service: Skilled Nursing Contact information: [place] [phone]                 Allergies  Allergen Reactions  . Latex Rash    Consultations:  Podiatry    Procedures/Studies: DG Chest 2 View  Result Date: 04/06/2020 CLINICAL DATA:  Status post fall. EXAM: CHEST - 2 VIEW COMPARISON:  Chest radiograph May 31, 2019. FINDINGS: Stable enlarged cardiac and mediastinal contours. Aortic atherosclerosis. Bilateral mid lower lung patchy airspace opacities. No pleural effusion or pneumothorax. Thoracic spine degenerative changes. IMPRESSION: Bilateral mid and lower lung patchy opacities which may represent atelectasis or infection. Electronically Signed   By: Lovey Newcomer M.D.   On: 04/06/2020 18:59   DG Shoulder Right  Result Date: 04/06/2020 CLINICAL DATA:  Recent fall with right shoulder pain, initial encounter EXAM: RIGHT SHOULDER - 2+ VIEW COMPARISON:  None. FINDINGS: Degenerative changes of the acromioclavicular and glenohumeral joints  are seen. The humeral head is high-riding likely related to chronic rotator cuff injury. The underlying bony thorax appears within normal limits. No other focal abnormality is seen. IMPRESSION: Degenerative change without acute abnormality. Electronically Signed   By: Inez Catalina M.D.   On: 04/06/2020 16:22   DG Tibia/Fibula Right  Result Date: 04/06/2020 CLINICAL DATA:  Recent fall with right leg pain, initial encounter EXAM: RIGHT TIBIA AND FIBULA - 2 VIEW COMPARISON:  None. FINDINGS: Tibia and fibula are within normal limits. There is an avulsion from the superior aspect of the calcaneus at the level of the Achilles insertion. No other focal abnormality is seen. IMPRESSION: Avulsion from the superior aspect of the calcaneus. Tibia and fibula are within normal  limits. Electronically Signed   By: Inez Catalina M.D.   On: 04/06/2020 16:22   CT Head Wo Contrast  Result Date: 04/06/2020 CLINICAL DATA:  Patient status post fall. EXAM: CT HEAD WITHOUT CONTRAST TECHNIQUE: Contiguous axial images were obtained from the base of the skull through the vertex without intravenous contrast. COMPARISON:  Brain CT 03/17/2019. FINDINGS: Brain: Ventricles and sulci are appropriate for patient's age. Redemonstrated encephalomalacia from chronic left MCA territory infarct. No evidence for acute cortically based infarct, intracranial hemorrhage, mass lesion or mass-effect. Vascular: Unremarkable. Skull: Intact. Sinuses/Orbits: Paranasal sinuses are well aerated. Mastoid air cells are unremarkable. Orbits are unremarkable. Other: None. IMPRESSION: 1. No acute intracranial process. 2. Old left MCA territory infarct. Electronically Signed   By: Lovey Newcomer M.D.   On: 04/06/2020 16:00   DG Pelvis Portable  Result Date: 04/06/2020 CLINICAL DATA:  [place], right-sided pain EXAM: PORTABLE PELVIS 1-2 VIEWS COMPARISON:  04/06/2020 FINDINGS: Supine frontal view of the pelvis demonstrates no acute displaced fractures. Enthesopathic changes are seen at the greater trochanters bilaterally, right greater than left. Symmetrical bilateral hip osteoarthritis. No subluxation or dislocation. There is mild sclerosis of the left SI joint. No erosive changes. Prominent degenerative changes at the lumbosacral junction. IMPRESSION: 1. No acute displaced fracture. If hip fracture remains a concern, follow-up CT or MRI could be considered. 2. Degenerative changes as above. Electronically Signed   By: Randa Ngo M.D.   On: 04/06/2020 17:02   MR FOOT RIGHT WO CONTRAST  Result Date: 04/07/2020 CLINICAL DATA:  [place], right foot pain EXAM: MRI OF THE RIGHT FOREFOOT WITHOUT CONTRAST TECHNIQUE: Multiplanar, multisequence MR imaging of the right forefoot was performed. No intravenous contrast was administered.  COMPARISON:  None. FINDINGS: Bones/Joint/Cartilage Evaluation is limited due to patient pain. Unable to utilize dedicated foot coil, limiting resolution of the exam. There is diffuse edema within the third proximal phalanx. Cortical discontinuity within the proximal medial aspect of the third proximal phalanx could reflect fracture or osteomyelitis. There is mild marrow edema within the heads of the second third metatarsals, without evidence of cortical disruption. Remaining bony structures demonstrate normal signal characteristics. Ligaments No gross abnormality on this limited exam. Muscles and Tendons Small amount of fluid within the flexor tendon sheaths consistent with tenosynovitis. Otherwise no gross abnormalities on this limited exam. Soft tissues Mild subcutaneous edema within the plantar aspect of the forefoot at the level of the second and third metatarsal heads. No fluid collection. No evidence of soft tissue defect. IMPRESSION: 1. Diffuse edema within the third proximal phalanx, with cortical discontinuity in the medial proximal aspect. Based on history, this is most consistent with fracture. 2. Mild edema within the heads of the second and third metatarsals, without clear cortical discontinuity. This could  reflect contusion in the setting of trauma. 3. Mild soft tissue edema plantar aspect of the forefoot. No fluid collection or hematoma. 4. Fluid within the flexor tendon sheaths consistent with mild tenosynovitis. Electronically Signed   By: Randa Ngo M.D.   On: 04/07/2020 00:02   MR ANKLE RIGHT WO CONTRAST  Result Date: 04/07/2020 CLINICAL DATA:  Right ankle pain after fall. EXAM: MRI OF THE RIGHT ANKLE WITHOUT CONTRAST TECHNIQUE: Multiplanar, multisequence MR imaging of the ankle was performed. No intravenous contrast was administered. COMPARISON:  MRI right foot from yesterday. Right tibia and fibula x-rays from yesterday. FINDINGS: TENDONS Peroneal: Peroneal longus tendon intact. Peroneal  brevis intact. Posteromedial: Posterior tibial tendon intact with small amount of fluid in the tendon sheath. Flexor digitorum longus tendon intact. Flexor hallucis longus tendon intact. Anterior: Tibialis anterior tendon intact. Extensor hallucis longus tendon intact Extensor digitorum longus tendon intact. Achilles:  Intact. Plantar Fascia: Intact. Mild thickening of the proximal central band. LIGAMENTS Lateral: Anterior talofibular ligament intact. Calcaneofibular ligament intact. Posterior talofibular ligament intact. Anterior and posterior tibiofibular ligaments intact. Medial: Deltoid ligament intact. Spring ligament intact. CARTILAGE Ankle Joint: No significant joint effusion. Normal ankle mortise. Diffuse cartilage thinning without focal defect. Small marginal osteophytes. Subtalar Joints/Sinus Tarsi: Small posterior subtalar joint marginal osteophytes with trace joint fluid communicating with the sinus tarsi. Bones: Acute avulsion fracture of the calcaneal tuberosity at the Achilles insertion with up to 1.1 cm distraction. Subcortical cystic change in the lateral talar process and adjacent calcaneus. Soft Tissue: Mild diffuse soft tissue swelling. No soft tissue mass or fluid collection. IMPRESSION: 1. Acute avulsion fracture of the calcaneal tuberosity at the Achilles insertion with up to 1.1 cm distraction. 2. Mild posterior tibial tenosynovitis. 3. Mild tibiotalar and posterior subtalar osteoarthritis. 4. Evidence of lateral hindfoot impingement. Electronically Signed   By: Titus Dubin M.D.   On: 04/07/2020 14:12   ECHOCARDIOGRAM COMPLETE  Result Date: 04/07/2020    ECHOCARDIOGRAM REPORT   Patient Name:   Sandra Massey Date of Exam: 04/07/2020 Medical Rec #:  [id number]    Height:       66.0 in Accession #:    [id number]   Weight:       225.0 lb Date of Birth:  07/30/1950   BSA:          2.102 m Patient Age:    26 years     BP:           115/55 mmHg Patient Gender: F            HR:           72  bpm. Exam Location:  [facility] Procedure: 2D Echo, Color Doppler and Cardiac Doppler Indications:     Elevated troponin  History:         Patient has prior history of Echocardiogram examinations, most                  recent 06/02/2019. Risk Factors:Hypertension and Diabetes.                  Substance abuse.  Sonographer:     Sherrie Sport RDCS (AE) Referring Phys:  [id number] AMY N COX Diagnosing Phys: Ida Rogue MD IMPRESSIONS  1. Left ventricular ejection fraction, by estimation, is 50 to 55%. The left ventricle has low normal function. Mild hypokinesis of the basal regions. Left ventricular diastolic parameters are indeterminate.  2. Right ventricular systolic function is normal. The right ventricular size is  normal.  3. Left atrial size was moderately dilated.  4. The mitral valve is normal in structure. Mild mitral valve regurgitation. No evidence of mitral stenosis.  5. Tricuspid valve regurgitation is moderate. FINDINGS  Left Ventricle: Left ventricular ejection fraction, by estimation, is 50 to 55%. The left ventricle has low normal function. The left ventricle has no regional wall motion abnormalities. The left ventricular internal cavity size was normal in size. There is no left ventricular hypertrophy. Left ventricular diastolic parameters are indeterminate. Right Ventricle: The right ventricular size is normal. No increase in right ventricular wall thickness. Right ventricular systolic function is normal. Left Atrium: Left atrial size was moderately dilated. Right Atrium: Right atrial size was normal in size. Pericardium: There is no evidence of pericardial effusion. Mitral Valve: The mitral valve is normal in structure. Mild mitral valve regurgitation. No evidence of mitral valve stenosis. Tricuspid Valve: The tricuspid valve is normal in structure. Tricuspid valve regurgitation is moderate . No evidence of tricuspid stenosis. Aortic Valve: The aortic valve was not well visualized. Aortic valve regurgitation  is not visualized. No aortic stenosis is present. Aortic valve mean gradient measures 4.0 mmHg. Aortic valve peak gradient measures 6.1 mmHg. Aortic valve area, by VTI measures 2.15 cm. Pulmonic Valve: The pulmonic valve was normal in structure. Pulmonic valve regurgitation is not visualized. No evidence of pulmonic stenosis. Aorta: The aortic root is normal in size and structure. Venous: The inferior vena cava is normal in size with greater than 50% respiratory variability, suggesting right atrial pressure of 3 mmHg. IAS/Shunts: No atrial level shunt detected by color flow Doppler.  LEFT VENTRICLE PLAX 2D LVIDd:         5.47 cm LVIDs:         3.89 cm LV PW:         0.99 cm LV IVS:        0.97 cm LVOT diam:     2.00 cm LV SV:         49 LV SV Index:   23 LVOT Area:     3.14 cm  RIGHT VENTRICLE RV Basal diam:  3.95 cm RV S prime:     9.90 cm/s TAPSE (M-mode): 3.2 cm LEFT ATRIUM              Index       RIGHT ATRIUM           Index LA diam:        5.60 cm  2.66 cm/m  RA Area:     43.30 cm LA Vol (A2C):   151.0 ml 71.82 ml/m RA Volume:   193.00 ml 91.80 ml/m LA Vol (A4C):   76.0 ml  36.15 ml/m LA Biplane Vol: 109.0 ml 51.84 ml/m  AORTIC VALVE                   PULMONIC VALVE AV Area (Vmax):    2.08 cm    PV Vmax:        0.85 m/s AV Area (Vmean):   2.17 cm    PV Peak grad:   2.9 mmHg AV Area (VTI):     2.15 cm    RVOT Peak grad: 5 mmHg AV Vmax:           123.50 cm/s AV Vmean:          93.150 cm/s AV VTI:            0.227 m AV Peak Grad:  6.1 mmHg AV Mean Grad:      4.0 mmHg LVOT Vmax:         81.60 cm/s LVOT Vmean:        64.300 cm/s LVOT VTI:          0.155 m LVOT/AV VTI ratio: 0.68  AORTA Ao Root diam: 3.10 cm MITRAL VALVE                TRICUSPID VALVE MV Area (PHT): 4.04 cm     TR Peak grad:   39.9 mmHg MV Decel Time: 188 msec     TR Vmax:        316.00 cm/s MV E velocity: 100.00 cm/s                             SHUNTS                             Systemic VTI:  0.16 m                              Systemic Diam: 2.00 cm Ida Rogue MD Electronically signed by Ida Rogue MD Signature Date/Time: 04/07/2020/4:58:28 PM    Final    DG Femur Min 2 Views Right  Result Date: 04/06/2020 CLINICAL DATA:  Recent fall with right hip pain, initial encounter EXAM: RIGHT FEMUR 2 VIEWS COMPARISON:  None. FINDINGS: Degenerative changes of the right hip joint are noted. No acute fracture or dislocation is seen. No soft tissue abnormalities are noted. Mild degenerative changes of the knee joint are seen as well. IMPRESSION: No acute abnormality noted. Electronically Signed   By: Inez Catalina M.D.   On: 04/06/2020 16:20      Subjective: pt c/o fatigue    Discharge Exam: Vitals:   04/12/20 0802 04/12/20 1140  BP: 124/86 (!) 151/91  Pulse: 86 71  Resp: 18 18  Temp: 98.6 F (37 C) 97.9 F (36.6 C)  SpO2: 95% 96%   Vitals:   04/12/20 0059 04/12/20 0530 04/12/20 0802 04/12/20 1140  BP: 119/87 (!) 151/76 124/86 (!) 151/91  Pulse: 94 76 86 71  Resp: '16 16 18 18  '$ Temp: 97.6 F (36.4 C) 98.5 F (36.9 C) 98.6 F (37 C) 97.9 F (36.6 C)  TempSrc:  Oral  Oral  SpO2: 93%  95% 96%  Weight:      Height:        General: Pt is alert, awake, not in acute distress. Difficulty talking  Cardiovascular:S1/S2 +, no rubs, no gallops Respiratory: CTA bilaterally, no wheezing, no rhonchi Abdominal: Soft, NT, obese, bowel sounds + Extremities:  no cyanosis    The results of significant diagnostics from this hospitalization (including imaging, microbiology, ancillary and laboratory) are listed below for reference.     Microbiology: Recent Results (from the past 240 hour(s))  SARS CORONAVIRUS 2 (TAT 6-24 HRS) Nasopharyngeal Nasopharyngeal Swab     Status: None   Collection Time: 04/06/20  8:28 PM   Specimen: Nasopharyngeal Swab  Result Value Ref Range Status   SARS Coronavirus 2 NEGATIVE NEGATIVE Final    Comment: (NOTE) SARS-CoV-2 target nucleic acids are NOT DETECTED.  The SARS-CoV-2 RNA is  generally detectable in upper and lower respiratory specimens during the acute phase of infection. Negative results do not preclude SARS-CoV-2 infection, do not rule out co-infections with  other pathogens, and should not be used as the sole basis for treatment or other patient management decisions. Negative results must be combined with clinical observations, patient history, and epidemiological information. The expected result is Negative.  Fact Sheet for Patients: SugarRoll.be  Fact Sheet for Healthcare Providers: https://www.woods-mathews.com/  This test is not yet approved or cleared by the [place] FDA and  has been authorized for detection and/or diagnosis of SARS-CoV-2 by FDA under an Emergency Use Authorization (EUA). This EUA will remain  in effect (meaning this test can be used) for the duration of the COVID-19 declaration under Se ction 564(b)(1) of the Act, 21 U.S.C. section 360bbb-3(b)(1), unless the authorization is terminated or revoked sooner.  Performed at [facility], [place] [address], [place], [place] [address]   Urine Culture     Status: Abnormal   Collection Time: 04/06/20 10:10 PM   Specimen: Urine, Clean Catch  Result Value Ref Range Status   Specimen Description   Final    URINE, CLEAN CATCH Performed at [facility], [address], [place], [place] [address]    Special Requests   Final    NONE Performed at [facility], [address] [place], [place] [address]    Culture >=100,000 COLONIES/mL ESCHERICHIA COLI (A)  Final   Report Status 04/10/2020 FINAL  Final   Organism ID, Bacteria ESCHERICHIA COLI (A)  Final      Susceptibility   Escherichia coli - MIC*    AMPICILLIN <=2 SENSITIVE Sensitive     CEFAZOLIN <=4 SENSITIVE Sensitive     CEFEPIME <=0.12 SENSITIVE Sensitive     CEFTRIAXONE <=0.25 SENSITIVE Sensitive     CIPROFLOXACIN <=0.25 SENSITIVE Sensitive     GENTAMICIN  <=1 SENSITIVE Sensitive     IMIPENEM <=0.25 SENSITIVE Sensitive     NITROFURANTOIN <=16 SENSITIVE Sensitive     TRIMETH/SULFA <=20 SENSITIVE Sensitive     AMPICILLIN/SULBACTAM <=2 SENSITIVE Sensitive     PIP/TAZO <=4 SENSITIVE Sensitive     * >=100,000 COLONIES/mL ESCHERICHIA COLI  CULTURE, BLOOD (ROUTINE X 2) w Reflex to ID Panel     Status: None   Collection Time: 04/06/20 10:40 PM   Specimen: BLOOD  Result Value Ref Range Status   Specimen Description BLOOD LEFT FOREARM  Final   Special Requests   Final    BOTTLES DRAWN AEROBIC AND ANAEROBIC Blood Culture adequate volume   Culture   Final    NO GROWTH 5 DAYS Performed at [facility], [address] [place], [place] [address]    Report Status 04/11/2020 FINAL  Final  CULTURE, BLOOD (ROUTINE X 2) w Reflex to ID Panel     Status: None   Collection Time: 04/06/20 10:41 PM   Specimen: BLOOD  Result Value Ref Range Status   Specimen Description BLOOD LEFT HAND  Final   Special Requests   Final    BOTTLES DRAWN AEROBIC AND ANAEROBIC Blood Culture adequate volume   Culture   Final    NO GROWTH 5 DAYS Performed at [facility], [address] [place],  [address]    Report Status 04/11/2020 FINAL  Final  MRSA PCR Screening     Status: None   Collection Time: 04/07/20  8:56 AM   Specimen: Nasopharyngeal  Result Value Ref Range Status   MRSA by PCR NEGATIVE NEGATIVE Final    Comment:        The GeneXpert MRSA Assay (FDA approved for NASAL specimens only),  is one component of a comprehensive MRSA colonization surveillance program. It is not intended to diagnose MRSA infection nor to guide or monitor treatment for MRSA infections. Performed at [facility], [address] [place], [place] [address]   SARS Coronavirus 2 by RT PCR (hospital order, performed in [facility] hospital lab) Nasopharyngeal Nasopharyngeal Swab     Status: None   Collection Time: 04/11/20 12:26 PM   Specimen:  Nasopharyngeal Swab  Result Value Ref Range Status   SARS Coronavirus 2 NEGATIVE NEGATIVE Final    Comment: (NOTE) SARS-CoV-2 target nucleic acids are NOT DETECTED.  The SARS-CoV-2 RNA is generally detectable in upper and lower respiratory specimens during the acute phase of infection. The lowest concentration of SARS-CoV-2 viral copies this assay can detect is 250 copies / mL. A negative result does not preclude SARS-CoV-2 infection and should not be used as the sole basis for treatment or other patient management decisions.  A negative result may occur with improper specimen collection / handling, submission of specimen other than nasopharyngeal swab, presence of viral mutation(s) within the areas targeted by this assay, and inadequate number of viral copies (<250 copies / mL). A negative result must be combined with clinical observations, patient history, and epidemiological information.  Fact Sheet for Patients:   StrictlyIdeas.no  Fact Sheet for Healthcare Providers: BankingDealers.co.za  This test is not yet approved or  cleared by the [place] FDA and has been authorized for detection and/or diagnosis of SARS-CoV-2 by FDA under an Emergency Use Authorization (EUA).  This EUA will remain in effect (meaning this test can be used) for the duration of the COVID-19 declaration under Section 564(b)(1) of the Act, 21 U.S.C. section 360bbb-3(b)(1), unless the authorization is terminated or revoked sooner.  Performed at [facility], [address] [place], [place] [address]      Labs: BNP (last 3 results) Recent Labs    04/06/20 2240  BNP [id number]*   Basic Metabolic Panel: Recent Labs  Lab 04/08/20 0501 04/09/20 0520 04/10/20 0353 04/10/20 1257 04/11/20 0607 04/12/20 0541  NA 139 136 133*  --  134* 131*  K 4.5 4.5 5.1  --  4.9 4.9  CL 103 98 95*  --  90* 90*  CO2 '27 29 27  '$ --  31 29  GLUCOSE 221* 271*  210* 447* 267* 289*  BUN 23 27* 38*  --  47* 53*  CREATININE 1.02* 1.06* 1.41*  --  1.37* 1.30*  CALCIUM 9.4 9.6 9.9  --  10.4* 10.2   Liver Function Tests: Recent Labs  Lab 04/06/20 1503  AST 30  ALT 32  ALKPHOS 100  BILITOT 1.0  PROT 8.0  ALBUMIN 3.7   No results for input(s): LIPASE, AMYLASE in the last 168 hours. No results for input(s): AMMONIA in the last 168 hours. CBC: Recent Labs  Lab 04/08/20 0501 04/09/20 0520 04/10/20 0353 04/11/20 0607 04/12/20 0541  WBC 8.9 8.7 11.9* 9.0 10.1  HGB 11.8* 12.6 13.7 13.7 14.4  HCT 36.3 38.5 41.6 42.6 43.7  MCV 90.1 88.1 88.9 87.7 88.3  PLT 181 195 222 232 241   Cardiac Enzymes: No results for input(s): CKTOTAL, CKMB, CKMBINDEX, TROPONINI in the last 168 hours. BNP: Invalid input(s): POCBNP CBG: Recent Labs  Lab 04/11/20 1155 04/11/20 1645 04/11/20 2058 04/12/20 0800 04/12/20 1142  GLUCAP 392* 300* 297* 297* 420*   D-Dimer No results for input(s): DDIMER in the last 72 hours. Hgb A1c No results for input(s): HGBA1C  in the last 72 hours. Lipid Profile No results for input(s): CHOL, HDL, LDLCALC, TRIG, CHOLHDL, LDLDIRECT in the last 72 hours. Thyroid function studies No results for input(s): TSH, T4TOTAL, T3FREE, THYROIDAB in the last 72 hours.  Invalid input(s): FREET3 Anemia work up No results for input(s): VITAMINB12, FOLATE, FERRITIN, TIBC, IRON, RETICCTPCT in the last 72 hours. Urinalysis    Component Value Date/Time   COLORURINE YELLOW (A) 04/06/2020 1945   APPEARANCEUR HAZY (A) 04/06/2020 1945   LABSPEC 1.017 04/06/2020 1945   PHURINE 7.0 04/06/2020 1945   GLUCOSEU NEGATIVE 04/06/2020 1945   HGBUR MODERATE (A) 04/06/2020 1945   BILIRUBINUR NEGATIVE 04/06/2020 1945   KETONESUR NEGATIVE 04/06/2020 1945   PROTEINUR NEGATIVE 04/06/2020 1945   NITRITE NEGATIVE 04/06/2020 1945   LEUKOCYTESUR TRACE (A) 04/06/2020 1945   Sepsis Labs Invalid input(s): PROCALCITONIN,  WBC,   LACTICIDVEN Microbiology Recent Results (from the past 240 hour(s))  SARS CORONAVIRUS 2 (TAT 6-24 HRS) Nasopharyngeal Nasopharyngeal Swab     Status: None   Collection Time: 04/06/20  8:28 PM   Specimen: Nasopharyngeal Swab  Result Value Ref Range Status   SARS Coronavirus 2 NEGATIVE NEGATIVE Final    Comment: (NOTE) SARS-CoV-2 target nucleic acids are NOT DETECTED.  The SARS-CoV-2 RNA is generally detectable in upper and lower respiratory specimens during the acute phase of infection. Negative results do not preclude SARS-CoV-2 infection, do not rule out co-infections with other pathogens, and should not be used as the sole basis for treatment or other patient management decisions. Negative results must be combined with clinical observations, patient history, and epidemiological information. The expected result is Negative.  Fact Sheet for Patients: SugarRoll.be  Fact Sheet for Healthcare Providers: https://www.woods-mathews.com/  This test is not yet approved or cleared by the [place] FDA and  has been authorized for detection and/or diagnosis of SARS-CoV-2 by FDA under an Emergency Use Authorization (EUA). This EUA will remain  in effect (meaning this test can be used) for the duration of the COVID-19 declaration under Se ction 564(b)(1) of the Act, 21 U.S.C. section 360bbb-3(b)(1), unless the authorization is terminated or revoked sooner.  Performed at [facility], [place] [address], [place], [place] [address]   Urine Culture     Status: Abnormal   Collection Time: 04/06/20 10:10 PM   Specimen: Urine, Clean Catch  Result Value Ref Range Status   Specimen Description   Final    URINE, CLEAN CATCH Performed at [facility], [address] [place], [place] [address]    Special Requests   Final    NONE Performed at [facility], [place], [place] [address]    Culture >=100,000  COLONIES/mL ESCHERICHIA COLI (A)  Final   Report Status 04/10/2020 FINAL  Final   Organism ID, Bacteria ESCHERICHIA COLI (A)  Final      Susceptibility   Escherichia coli - MIC*    AMPICILLIN <=2 SENSITIVE Sensitive     CEFAZOLIN <=4 SENSITIVE Sensitive     CEFEPIME <=0.12 SENSITIVE Sensitive     CEFTRIAXONE <=0.25 SENSITIVE Sensitive     CIPROFLOXACIN <=0.25 SENSITIVE Sensitive     GENTAMICIN <=1 SENSITIVE Sensitive     IMIPENEM <=0.25 SENSITIVE Sensitive     NITROFURANTOIN <=16 SENSITIVE Sensitive     TRIMETH/SULFA <=20 SENSITIVE Sensitive     AMPICILLIN/SULBACTAM <=2 SENSITIVE Sensitive     PIP/TAZO <=4 SENSITIVE Sensitive     * >=100,000 COLONIES/mL ESCHERICHIA COLI  CULTURE, BLOOD (ROUTINE X  2) w Reflex to ID Panel     Status: None   Collection Time: 04/06/20 10:40 PM   Specimen: BLOOD  Result Value Ref Range Status   Specimen Description BLOOD LEFT FOREARM  Final   Special Requests   Final    BOTTLES DRAWN AEROBIC AND ANAEROBIC Blood Culture adequate volume   Culture   Final    NO GROWTH 5 DAYS Performed at [facility], [address] [place], [place] [address]    Report Status 04/11/2020 FINAL  Final  CULTURE, BLOOD (ROUTINE X 2) w Reflex to ID Panel     Status: None   Collection Time: 04/06/20 10:41 PM   Specimen: BLOOD  Result Value Ref Range Status   Specimen Description BLOOD LEFT HAND  Final   Special Requests   Final    BOTTLES DRAWN AEROBIC AND ANAEROBIC Blood Culture adequate volume   Culture   Final    NO GROWTH 5 DAYS Performed at [facility], [address] [place], [place] [address]    Report Status 04/11/2020 FINAL  Final  MRSA PCR Screening     Status: None   Collection Time: 04/07/20  8:56 AM   Specimen: Nasopharyngeal  Result Value Ref Range Status   MRSA by PCR NEGATIVE NEGATIVE Final    Comment:        The GeneXpert MRSA Assay (FDA approved for NASAL specimens only), is one component of a comprehensive MRSA  colonization surveillance program. It is not intended to diagnose MRSA infection nor to guide or monitor treatment for MRSA infections. Performed at [facility], [address] [place], [place] [address]   SARS Coronavirus 2 by RT PCR (hospital order, performed in [facility] hospital lab) Nasopharyngeal Nasopharyngeal Swab     Status: None   Collection Time: 04/11/20 12:26 PM   Specimen: Nasopharyngeal Swab  Result Value Ref Range Status   SARS Coronavirus 2 NEGATIVE NEGATIVE Final    Comment: (NOTE) SARS-CoV-2 target nucleic acids are NOT DETECTED.  The SARS-CoV-2 RNA is generally detectable in upper and lower respiratory specimens during the acute phase of infection. The lowest concentration of SARS-CoV-2 viral copies this assay can detect is 250 copies / mL. A negative result does not preclude SARS-CoV-2 infection and should not be used as the sole basis for treatment or other patient management decisions.  A negative result may occur with improper specimen collection / handling, submission of specimen other than nasopharyngeal swab, presence of viral mutation(s) within the areas targeted by this assay, and inadequate number of viral copies (<250 copies / mL). A negative result must be combined with clinical observations, patient history, and epidemiological information.  Fact Sheet for Patients:   StrictlyIdeas.no  Fact Sheet for Healthcare Providers: BankingDealers.co.za  This test is not yet approved or  cleared by the [place] FDA and has been authorized for detection and/or diagnosis of SARS-CoV-2 by FDA under an Emergency Use Authorization (EUA).  This EUA will remain in effect (meaning this test can be used) for the duration of the COVID-19 declaration under Section 564(b)(1) of the Act, 21 U.S.C. section 360bbb-3(b)(1), unless the authorization is terminated or revoked sooner.  Performed at [facility], [address] [place], [place] [address]      Time coordinating discharge: Over 30 minutes  SIGNED:  Wyvonnia Dusky, MD         [facility] 04/11/2020, 1:23 PM  ---------------------------------------------------------------------------------   Addendum:  Note, pt was  discharged on 04/11/2020 by Dr. Wyvonnia Dusky, however, did not leave the hospital until 04/13/2020 due to needing to have a bowel movement prior to facility acceptance. I made 1 change to the discharge medication, increased meal-time SSI to 20u TID.   Enzo Bi, MD  [facility] 04/12/2020, 11:57 AM Pager   If 7PM-7AM, please contact night-coverage

## 2020-04-11 NOTE — Care Management Important Message (Signed)
Important Message  Patient Details  Name: Sandra Massey MRN: [id number] Date of Birth: 07/05/1950   Medicare Important Message Given:  Yes     Juliann Pulse A Kida Digiulio 04/11/2020, 1:01 PM

## 2020-04-11 NOTE — Progress Notes (Signed)
PROGRESS NOTE    HPI was taken from Dr. Tobie Poet: Sandra Massey is a 70 y.o. female with medical history significant for history of old left MCA territory infarct, with right-sided hemiparesis, chronic A. fib, insulin-dependent diabetes mellitus, hypertension, abdominal obesity, presented to the emergency department for chief concerns of mechanical fall.  At bedside, patient has chronic difficulty speaking.  She states that she fell twice today and endorses back pain and right foot pain.  She also endorses increased urination.  She denies fever, shortness of breath, chest pain, abdominal pain.  She states that she is a Jehovah witness and does not want blood transfusion.  She does not want chest compressions and intubation should she have cardiac arrest.  Hospital Course from Dr. Jimmye Norman 1/28-04/11/20: Pt was found to have a closed right calcaneal fracture secondary to a fall (present on admission). Pt is not currently a surgical candidate as per podiatry and has been treated conservatively w/ posterior splint. Pt is non-weight bearing of RLE. Of note, pt was found to have e.coli UTI and was initially given IV ceftriaxone and then switch to po amoxicillin when urine cx sens were finalized. PT/OT evaluated the pt and recommended SNF. Awaiting SNF placement still   Sandra Massey  [id number] DOB: 11-20-1950 DOA: 04/06/2020 PCP: [name], MD    Assessment & Plan:   Principal Problem:   Closed right calcaneal fracture Active Problems:   Chronic a-fib (HCC)   Weakness   Essential hypertension   Heel fracture   Closed right calcaneal fracture: has pain intermittently. Continue w/ non-weighting right bearing of RLE. MRI ankle shows acute avulsion fx of the calcaneal tuberosity at the achilles insertion, tibial tenosynovitis, OA & lateral hindfoot impingement. Not a good surgical candidate as per podiatry. Continue w/ conservative management w/ posterior splint application. PT/OT recs SNF.  Waiting on SNF placement still   Elevated troponins: minimally elevated, likely secondary to demand ischemia. Echo shows EF [id number], diastolic parameters are indeterminate & mild hypokinesis of the basal regions. Minimal change in echo from March 2021  UTI: urine cx is growing e. Coli. Switch to po amoxicillin as per urine cx results   Sepsis: r/o. Non-toxic appearing   DM2: poorly controlled, HbA1c 11.3. 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 Hold home dose of spironolactone, lisinopril. Avoid nephrotoxic meds   Chronic a. fib: continue on amiodarone, eliquis   Hx of CVA: with known right hemiparesis. Difficulty talking at baseline. Will f/u outpatient w/ neuro  Depression: severity unknown. Continue on home dose of sertraline   Morbid Obesity: complicates overall care and prognosis. BMI 36.3   DVT prophylaxis: heparin  Code Status: full  Family Communication:  Disposition Plan:  Waiting on SNF placement   Level of care: Med-Surg   Status is: Inpatient  Remains inpatient appropriate because:Unsafe d/c plan and IV treatments appropriate due to intensity of illness or inability to take PO, awaiting urine cxs to be finalized & for SNF placement    Dispo: The patient is from: Home              Anticipated d/c is to: SNF              Anticipated d/c date is: 2 days              Patient currently is not medically stable to d/c.   Difficult to place patient Yes    Consultants:   Podiatry   Procedures:  Antimicrobials: ceftriaxone    Subjective: Pt c/o fatigue   Objective: Vitals:   04/10/20 1655 04/10/20 2003 04/11/20 0006 04/11/20 0415  BP: 131/83 133/79 118/90 137/74  Pulse: (!) 58 79 82 69  Resp: '16 18 18 18  '$ Temp: 98.4 F (36.9 C) 98.4 F (36.9 C) 97.7 F (36.5 C) 98 F (36.7 C)  TempSrc: Oral  Oral   SpO2: 92% 91% 90% 95%  Weight:      Height:        Intake/Output Summary (Last 24 hours) at 04/11/2020  0738 Last data filed at 04/11/2020 0455 Gross per 24 hour  Intake 600 ml  Output 3100 ml  Net -2500 ml   Filed Weights   04/06/20 1501  Weight: 102.1 kg    Examination:  General exam: Appears calm & comfortable. Difficulty talking. Obese Respiratory system: clear breath sounds b/l. Cardiovascular system: S1/S2+. No rubs or gallops Gastrointestinal system: Abd is soft, NT, ND & hypoactive bowel sounds   Central nervous system: Alert and awake  Psychiatry: Judgement and insight appear normal. Flat mood and affect    Data Reviewed: I have personally reviewed following labs and imaging studies  CBC: Recent Labs  Lab 04/07/20 0047 04/08/20 0501 04/09/20 0520 04/10/20 0353 04/11/20 0607  WBC 9.0 8.9 8.7 11.9* 9.0  HGB 12.5 11.8* 12.6 13.7 13.7  HCT 39.8 36.3 38.5 41.6 42.6  MCV 90.7 90.1 88.1 88.9 87.7  PLT 199 181 195 222 [id number]   Basic Metabolic Panel: Recent Labs  Lab 04/07/20 0047 04/08/20 0501 04/09/20 0520 04/10/20 0353 04/10/20 1257 04/11/20 0607  NA 138 139 136 133*  --  134*  K 4.4 4.5 4.5 5.1  --  4.9  CL 98 103 98 95*  --  90*  CO2 '27 27 29 27  '$ --  31  GLUCOSE 351* 221* 271* 210* 447* 267*  BUN 25* 23 27* 38*  --  47*  CREATININE 1.15* 1.02* 1.06* 1.41*  --  1.37*  CALCIUM 9.7 9.4 9.6 9.9  --  10.4*   GFR: Estimated Creatinine Clearance: 47.4 mL/min (A) (by C-G formula based on SCr of 1.37 mg/dL (H)). Liver Function Tests: Recent Labs  Lab 04/06/20 1503  AST 30  ALT 32  ALKPHOS 100  BILITOT 1.0  PROT 8.0  ALBUMIN 3.7   No results for input(s): LIPASE, AMYLASE in the last 168 hours. No results for input(s): AMMONIA in the last 168 hours. Coagulation Profile: Recent Labs  Lab 04/06/20 2203  INR 1.3*   Cardiac Enzymes: No results for input(s): CKTOTAL, CKMB, CKMBINDEX, TROPONINI in the last 168 hours. BNP (last 3 results) No results for input(s): PROBNP in the last 8760 hours. HbA1C: No results for input(s): HGBA1C in the last 72  hours. CBG: Recent Labs  Lab 04/09/20 2122 04/10/20 0818 04/10/20 1139 04/10/20 1653 04/10/20 2051  GLUCAP 260* 255* 440* 412* 472*   Lipid Profile: No results for input(s): CHOL, HDL, LDLCALC, TRIG, CHOLHDL, LDLDIRECT in the last 72 hours. Thyroid Function Tests: No results for input(s): TSH, T4TOTAL, FREET4, T3FREE, THYROIDAB in the last 72 hours. Anemia Panel: No results for input(s): VITAMINB12, FOLATE, FERRITIN, TIBC, IRON, RETICCTPCT in the last 72 hours. Sepsis Labs: No results for input(s): PROCALCITON, LATICACIDVEN in the last 168 hours.  Recent Results (from the past 240 hour(s))  SARS CORONAVIRUS 2 (TAT 6-24 HRS) Nasopharyngeal Nasopharyngeal Swab     Status: None   Collection Time: 04/06/20  8:28 PM   Specimen: Nasopharyngeal Swab  Result Value Ref Range Status   SARS Coronavirus 2 NEGATIVE NEGATIVE Final    Comment: (NOTE) SARS-CoV-2 target nucleic acids are NOT DETECTED.  The SARS-CoV-2 RNA is generally detectable in upper and lower respiratory specimens during the acute phase of infection. Negative results do not preclude SARS-CoV-2 infection, do not rule out co-infections with other pathogens, and should not be used as the sole basis for treatment or other patient management decisions. Negative results must be combined with clinical observations, patient history, and epidemiological information. The expected result is Negative.  Fact Sheet for Patients: SugarRoll.be  Fact Sheet for Healthcare Providers: https://www.woods-mathews.com/  This test is not yet approved or cleared by the [place] FDA and  has been authorized for detection and/or diagnosis of SARS-CoV-2 by FDA under an Emergency Use Authorization (EUA). This EUA will remain  in effect (meaning this test can be used) for the duration of the COVID-19 declaration under Se ction 564(b)(1) of the Act, 21 U.S.C. section 360bbb-3(b)(1), unless the  authorization is terminated or revoked sooner.  Performed at [facility], [place] [address] [place], [place] [address]   Urine Culture     Status: Abnormal   Collection Time: 04/06/20 10:10 PM   Specimen: Urine, Clean Catch  Result Value Ref Range Status   Specimen Description   Final    URINE, CLEAN CATCH Performed at [facility], [address], [place], [place] [address]    Special Requests   Final    NONE Performed at [facility], [address] [place], [place] [address]    Culture >=100,000 COLONIES/mL ESCHERICHIA COLI (A)  Final   Report Status 04/10/2020 FINAL  Final   Organism ID, Bacteria ESCHERICHIA COLI (A)  Final      Susceptibility   Escherichia coli - MIC*    AMPICILLIN <=2 SENSITIVE Sensitive     CEFAZOLIN <=4 SENSITIVE Sensitive     CEFEPIME <=0.12 SENSITIVE Sensitive     CEFTRIAXONE <=0.25 SENSITIVE Sensitive     CIPROFLOXACIN <=0.25 SENSITIVE Sensitive     GENTAMICIN <=1 SENSITIVE Sensitive     IMIPENEM <=0.25 SENSITIVE Sensitive     NITROFURANTOIN <=16 SENSITIVE Sensitive     TRIMETH/SULFA <=20 SENSITIVE Sensitive     AMPICILLIN/SULBACTAM <=2 SENSITIVE Sensitive     PIP/TAZO <=4 SENSITIVE Sensitive     * >=100,000 COLONIES/mL ESCHERICHIA COLI  CULTURE, BLOOD (ROUTINE X 2) w Reflex to ID Panel     Status: None   Collection Time: 04/06/20 10:40 PM   Specimen: BLOOD  Result Value Ref Range Status   Specimen Description BLOOD LEFT FOREARM  Final   Special Requests   Final    BOTTLES DRAWN AEROBIC AND ANAEROBIC Blood Culture adequate volume   Culture   Final    NO GROWTH 5 DAYS Performed at [facility], [address] [place], [place] [address]    Report Status 04/11/2020 FINAL  Final  CULTURE, BLOOD (ROUTINE X 2) w Reflex to ID Panel     Status: None   Collection Time: 04/06/20 10:41 PM   Specimen: BLOOD  Result Value Ref Range Status   Specimen Description BLOOD LEFT HAND  Final   Special Requests   Final     BOTTLES DRAWN AEROBIC AND ANAEROBIC Blood Culture adequate volume   Culture   Final    NO GROWTH 5 DAYS Performed at [facility], [address] [place],  [address]    Report Status 04/11/2020 FINAL  Final  MRSA PCR Screening     Status: None   Collection Time: 04/07/20  8:56 AM   Specimen: Nasopharyngeal  Result Value Ref Range Status   MRSA by PCR NEGATIVE NEGATIVE Final    Comment:        The GeneXpert MRSA Assay (FDA approved for NASAL specimens only), is one component of a comprehensive MRSA colonization surveillance program. It is not intended to diagnose MRSA infection nor to guide or monitor treatment for MRSA infections. Performed at [facility], [address], [place], [place] [address]          Radiology Studies: No results found.      Scheduled Meds: . amiodarone  200 mg Oral Daily  . apixaban  5 mg Oral BID  . furosemide  20 mg Oral Daily  . insulin aspart  0-20 Units Subcutaneous TID WC  . insulin aspart  0-5 Units Subcutaneous QHS  . insulin aspart  5 Units Subcutaneous TID WC  . insulin glargine  35 Units Subcutaneous Daily  . rosuvastatin  40 mg Oral Daily  . sertraline  50 mg Oral Daily   Continuous Infusions: . sodium chloride 500 mL (04/10/20 1805)  . cefTRIAXone (ROCEPHIN)  IV 2 g (04/10/20 1806)     LOS: 4 days    Time spent: 33 mins     Wyvonnia Dusky, MD [facility] Pager [phone]  If 7PM-7AM, please contact night-coverage 04/11/2020, 7:38 AM

## 2020-04-12 LAB — CBC
HCT: 43.7 % (ref 36.0–46.0)
Hemoglobin: 14.4 g/dL (ref 12.0–15.0)
MCH: 29.1 pg (ref 26.0–34.0)
MCHC: 33 g/dL (ref 30.0–36.0)
MCV: 88.3 fL (ref 80.0–100.0)
Platelets: 241 10*3/uL (ref 150–400)
RBC: 4.95 MIL/uL (ref 3.87–5.11)
RDW: 13.5 % (ref 11.5–15.5)
WBC: 10.1 10*3/uL (ref 4.0–10.5)
nRBC: 0 % (ref 0.0–0.2)

## 2020-04-12 LAB — GLUCOSE, CAPILLARY
Glucose-Capillary: 297 mg/dL — ABNORMAL HIGH (ref 70–99)
Glucose-Capillary: 420 mg/dL — ABNORMAL HIGH (ref 70–99)
Glucose-Capillary: 400 mg/dL — ABNORMAL HIGH (ref 70–99)
Glucose-Capillary: 156 mg/dL — ABNORMAL HIGH (ref 70–99)

## 2020-04-12 LAB — BASIC METABOLIC PANEL
Anion gap: 12 (ref 5–15)
BUN: 53 mg/dL — ABNORMAL HIGH (ref 8–23)
CO2: 29 mmol/L (ref 22–32)
Calcium: 10.2 mg/dL (ref 8.9–10.3)
Chloride: 90 mmol/L — ABNORMAL LOW (ref 98–111)
Creatinine, Ser: 1.3 mg/dL — ABNORMAL HIGH (ref 0.44–1.00)
GFR, Estimated: 45 mL/min — ABNORMAL LOW (ref >60)
Glucose, Bld: 289 mg/dL — ABNORMAL HIGH (ref 70–99)
Potassium: 4.9 mmol/L (ref 3.5–5.1)
Sodium: 131 mmol/L — ABNORMAL LOW (ref 135–145)

## 2020-04-12 MED ORDER — MAGNESIUM CITRATE PO SOLN
1.0000 | Freq: Once | ORAL | Status: AC
  Administered 2020-04-12: 1 via ORAL
  Filled 2020-04-12: qty 296

## 2020-04-12 MED ORDER — POLYETHYLENE GLYCOL 3350 17 G PO PACK
34.0000 g | PACK | ORAL | Status: AC
Start: 2020-04-12 — End: 2020-04-12
  Administered 2020-04-12 (×4): 34 g via ORAL
  Filled 2020-04-12 (×4): qty 2

## 2020-04-12 MED ORDER — INSULIN ASPART 100 UNIT/ML FLEXPEN: 20.0000 [IU] | PEN_INJECTOR | Freq: Three times a day (TID) | SUBCUTANEOUS | Status: DC

## 2020-04-12 NOTE — Progress Notes (Signed)
PT Cancellation Note  Patient Details Name: Sandra Massey MRN: [id number] DOB: May 28, 1950   Cancelled Treatment:     PT hold. Pt's BS >400 and has been given medicine to promote BM. Will hold PT today but continue to follow when more appropriate to participate.    Willette Pa 04/12/2020, 2:49 PM

## 2020-04-12 NOTE — Progress Notes (Signed)
Inpatient Diabetes Program Recommendations  AACE/ADA: New Consensus Statement on Inpatient Glycemic Control (2015)  Target Ranges:  Prepandial:   less than 140 mg/dL      Peak postprandial:   less than 180 mg/dL (1-2 hours)      Critically ill patients:  140 - 180 mg/dL   Results for Sandra Massey, Sandra Massey (MRN [id number]) as of 04/12/2020 13:38  Ref. Range 04/12/2020 08:00 04/12/2020 11:42  Glucose-Capillary Latest Ref Range: 70 - 99 mg/dL 297 (H)  16 units NOVOLOG  35 units LANTUS  420 (H)  25 units NOVOLOG     Home DM Meds: Novolog 7 units TID with meals                             Lantus 32 units Daily  Current Orders: Lantus 35 units Daily                            Novolog Resistant Correction Scale/ SSI (0-20 units) TID AC + HS      Novolog 5 units TID with meals     MD- If pt discharge to SNF delayed, please consider the following while waiting for transfer to SNF:  1. Increase Lantus to 40 units Daily  2. Increase Novolog Meal Coverage to 10 units TID with meals     --Will follow patient during hospitalization--  Wyn Quaker RN, MSN, CDE Diabetes Coordinator Inpatient Glycemic Control Team Team Pager: [phone] (8a-5p)

## 2020-04-13 LAB — GLUCOSE, CAPILLARY: Glucose-Capillary: 287 mg/dL — ABNORMAL HIGH (ref 70–99)

## 2020-04-13 NOTE — Progress Notes (Signed)
Pt d/c to [facility] this AM.  D/c packet left with pt along with all her belongings.  IV removed without issue.

## 2020-04-13 NOTE — Progress Notes (Signed)
2nd attempt to call report unsuccessful.

## 2020-04-13 NOTE — Progress Notes (Addendum)
Inpatient Diabetes Program Recommendations  AACE/ADA: New Consensus Statement on Inpatient Glycemic Control (2015)  Target Ranges:  Prepandial:   less than 140 mg/dL      Peak postprandial:   less than 180 mg/dL (1-2 hours)      Critically ill patients:  140 - 180 mg/dL   Lab Results  Component Value Date   GLUCAP 287 (H) 04/13/2020   HGBA1C 11.3 (H) 04/06/2020    Review of Glycemic Control Results for Sandra Massey, Sandra Massey (MRN [id number]) as of 04/13/2020 11:25  Ref. Range 04/12/2020 08:00 04/12/2020 11:42 04/12/2020 16:56 04/12/2020 21:43 04/13/2020 07:37  Glucose-Capillary Latest Ref Range: 70 - 99 mg/dL 297 (H) Novolog 16 units + Lantus 35 units  420 (H) Novolog 25 units 400 (H) Novolog 25 units Ate 30% meal 156 (H) 287 (H)\Novolog 16 units + Lantus 35   Home DM Meds:Novolog 7 units TID with meals Lantus 32 units Daily  Current Orders:Lantus 35 units Daily Novolog Resistant Correction Scale/ SSI (0-20 units) TID AC + HS                            Novolog 5 units TID with meals     MD- If pt discharge to SNF delayed, please consider the following while waiting for transfer to SNF:  1. Increase Lantus to 40 units Daily  2. Increase Novolog Meal Coverage to 10 units TID with meals Secure chat sent to Dr. Billie Ruddy.  Thank you, Nani Gasser. Terrisha Lopata, RN, MSN, CDE  Diabetes Coordinator Inpatient Glycemic Control Team Team Pager [phone] (8am-5pm) 04/13/2020 11:29 AM

## 2020-04-13 NOTE — Progress Notes (Signed)
Nurse attempted to call report to nurse at [facility] resources, no nurse avail at this time to take report.  Will call back.

## 2020-06-23 ENCOUNTER — Inpatient Hospital Stay
Admission: EM | Admit: 2020-06-23 | Discharge: 2020-06-26 | DRG: 683 | Disposition: A | Discharge status: Skilled Nursing Facility | Payer: Medicare HMO | Source: Skilled Nursing Facility | Attending: Family Medicine | Admitting: Family Medicine

## 2020-06-23 ENCOUNTER — Other Ambulatory Visit: Payer: Self-pay

## 2020-06-23 ENCOUNTER — Encounter: Payer: Self-pay | Admitting: Internal Medicine

## 2020-06-23 DIAGNOSIS — N183 Chronic kidney disease, stage 3 unspecified: Secondary | ICD-10-CM | POA: Insufficient documentation

## 2020-06-23 DIAGNOSIS — Z7901 Long term (current) use of anticoagulants: Secondary | ICD-10-CM

## 2020-06-23 DIAGNOSIS — E869 Volume depletion, unspecified: Secondary | ICD-10-CM | POA: Diagnosis present

## 2020-06-23 DIAGNOSIS — N179 Acute kidney failure, unspecified: Principal | ICD-10-CM | POA: Diagnosis present

## 2020-06-23 DIAGNOSIS — N1832 Chronic kidney disease, stage 3b: Secondary | ICD-10-CM | POA: Diagnosis present

## 2020-06-23 DIAGNOSIS — Y92129 Unspecified place in nursing home as the place of occurrence of the external cause: Secondary | ICD-10-CM

## 2020-06-23 DIAGNOSIS — I69351 Hemiplegia and hemiparesis following cerebral infarction affecting right dominant side: Secondary | ICD-10-CM

## 2020-06-23 DIAGNOSIS — N189 Chronic kidney disease, unspecified: Secondary | ICD-10-CM | POA: Diagnosis not present

## 2020-06-23 DIAGNOSIS — E1140 Type 2 diabetes mellitus with diabetic neuropathy, unspecified: Secondary | ICD-10-CM | POA: Diagnosis present

## 2020-06-23 DIAGNOSIS — Z20822 Contact with and (suspected) exposure to covid-19: Secondary | ICD-10-CM | POA: Diagnosis present

## 2020-06-23 DIAGNOSIS — N200 Calculus of kidney: Secondary | ICD-10-CM | POA: Diagnosis present

## 2020-06-23 DIAGNOSIS — Z6836 Body mass index (BMI) 36.0-36.9, adult: Secondary | ICD-10-CM

## 2020-06-23 DIAGNOSIS — Z79899 Other long term (current) drug therapy: Secondary | ICD-10-CM

## 2020-06-23 DIAGNOSIS — Z91040 Latex allergy status: Secondary | ICD-10-CM

## 2020-06-23 DIAGNOSIS — R350 Frequency of micturition: Secondary | ICD-10-CM | POA: Diagnosis present

## 2020-06-23 DIAGNOSIS — T502X5A Adverse effect of carbonic-anhydrase inhibitors, benzothiadiazides and other diuretics, initial encounter: Secondary | ICD-10-CM | POA: Diagnosis present

## 2020-06-23 DIAGNOSIS — S92001D Unspecified fracture of right calcaneus, subsequent encounter for fracture with routine healing: Secondary | ICD-10-CM

## 2020-06-23 DIAGNOSIS — I482 Chronic atrial fibrillation, unspecified: Secondary | ICD-10-CM | POA: Diagnosis present

## 2020-06-23 DIAGNOSIS — E11649 Type 2 diabetes mellitus with hypoglycemia without coma: Secondary | ICD-10-CM | POA: Diagnosis present

## 2020-06-23 DIAGNOSIS — I10 Essential (primary) hypertension: Secondary | ICD-10-CM | POA: Diagnosis present

## 2020-06-23 DIAGNOSIS — I129 Hypertensive chronic kidney disease with stage 1 through stage 4 chronic kidney disease, or unspecified chronic kidney disease: Secondary | ICD-10-CM | POA: Diagnosis present

## 2020-06-23 DIAGNOSIS — I69320 Aphasia following cerebral infarction: Secondary | ICD-10-CM

## 2020-06-23 DIAGNOSIS — Z794 Long term (current) use of insulin: Secondary | ICD-10-CM

## 2020-06-23 DIAGNOSIS — Z66 Do not resuscitate: Secondary | ICD-10-CM | POA: Diagnosis present

## 2020-06-23 DIAGNOSIS — E785 Hyperlipidemia, unspecified: Secondary | ICD-10-CM | POA: Diagnosis present

## 2020-06-23 DIAGNOSIS — I69322 Dysarthria following cerebral infarction: Secondary | ICD-10-CM

## 2020-06-23 DIAGNOSIS — I6930 Unspecified sequelae of cerebral infarction: Secondary | ICD-10-CM

## 2020-06-23 DIAGNOSIS — W19XXXD Unspecified fall, subsequent encounter: Secondary | ICD-10-CM | POA: Diagnosis present

## 2020-06-23 DIAGNOSIS — F32A Depression, unspecified: Secondary | ICD-10-CM | POA: Diagnosis present

## 2020-06-23 DIAGNOSIS — E1122 Type 2 diabetes mellitus with diabetic chronic kidney disease: Secondary | ICD-10-CM | POA: Diagnosis present

## 2020-06-23 DIAGNOSIS — E876 Hypokalemia: Secondary | ICD-10-CM | POA: Diagnosis present

## 2020-06-23 HISTORY — DX: Chronic atrial fibrillation, unspecified: I48.20

## 2020-06-23 HISTORY — DX: Chronic kidney disease, stage 3 unspecified: N18.30

## 2020-06-23 HISTORY — DX: Unspecified sequelae of cerebral infarction: I69.30

## 2020-06-23 LAB — BASIC METABOLIC PANEL
Anion gap: 13 (ref 5–15)
BUN: 96 mg/dL — ABNORMAL HIGH (ref 8–23)
CO2: 26 mmol/L (ref 22–32)
Calcium: 9.7 mg/dL (ref 8.9–10.3)
Chloride: 97 mmol/L — ABNORMAL LOW (ref 98–111)
Creatinine, Ser: 3.1 mg/dL — ABNORMAL HIGH (ref 0.44–1.00)
GFR, Estimated: 16 mL/min — ABNORMAL LOW (ref >60)
Glucose, Bld: 61 mg/dL — ABNORMAL LOW (ref 70–99)
Potassium: 3.3 mmol/L — ABNORMAL LOW (ref 3.5–5.1)
Sodium: 136 mmol/L (ref 135–145)

## 2020-06-23 LAB — CBC WITH DIFFERENTIAL/PLATELET
Abs Immature Granulocytes: 0.08 10*3/uL — ABNORMAL HIGH (ref 0.00–0.07)
Basophils Absolute: 0.1 10*3/uL (ref 0.0–0.1)
Basophils Relative: 1 %
Eosinophils Absolute: 0.2 10*3/uL (ref 0.0–0.5)
Eosinophils Relative: 3 %
HCT: 41.3 % (ref 36.0–46.0)
Hemoglobin: 13.5 g/dL (ref 12.0–15.0)
Immature Granulocytes: 1 %
Lymphocytes Relative: 15 %
Lymphs Abs: 1.4 10*3/uL (ref 0.7–4.0)
MCH: 28.4 pg (ref 26.0–34.0)
MCHC: 32.7 g/dL (ref 30.0–36.0)
MCV: 86.9 fL (ref 80.0–100.0)
Monocytes Absolute: 0.8 10*3/uL (ref 0.1–1.0)
Monocytes Relative: 8 %
Neutro Abs: 6.8 10*3/uL (ref 1.7–7.7)
Neutrophils Relative %: 72 %
Platelets: 369 10*3/uL (ref 150–400)
RBC: 4.75 MIL/uL (ref 3.87–5.11)
RDW: 14.3 % (ref 11.5–15.5)
WBC: 9.4 10*3/uL (ref 4.0–10.5)
nRBC: 0 % (ref 0.0–0.2)

## 2020-06-23 MED ORDER — ONDANSETRON HCL 4 MG PO TABS: 4.0000 mg | ORAL_TABLET | Freq: Four times a day (QID) | ORAL | Status: DC | PRN

## 2020-06-23 MED ORDER — ROSUVASTATIN CALCIUM 20 MG PO TABS
40.0000 mg | ORAL_TABLET | Freq: Every evening | ORAL | Status: DC
  Administered 2020-06-24 – 2020-06-25 (×2): 40 mg via ORAL
  Filled 2020-06-23 (×2): qty 2

## 2020-06-23 MED ORDER — AMIODARONE HCL 200 MG PO TABS
200.0000 mg | ORAL_TABLET | Freq: Two times a day (BID) | ORAL | Status: DC
  Administered 2020-06-24 – 2020-06-26 (×6): 200 mg via ORAL
  Filled 2020-06-23 (×7): qty 1

## 2020-06-23 MED ORDER — SERTRALINE HCL 50 MG PO TABS
50.0000 mg | ORAL_TABLET | Freq: Every day | ORAL | Status: DC
  Administered 2020-06-24 – 2020-06-26 (×3): 50 mg via ORAL
  Filled 2020-06-23 (×3): qty 1

## 2020-06-23 MED ORDER — SENNOSIDES-DOCUSATE SODIUM 8.6-50 MG PO TABS: 1.0000 | ORAL_TABLET | Freq: Every evening | ORAL | Status: DC | PRN

## 2020-06-23 MED ORDER — SODIUM CHLORIDE 0.9 % IV BOLUS
1000.0000 mL | Freq: Once | INTRAVENOUS | Status: AC
  Administered 2020-06-23: 1000 mL via INTRAVENOUS

## 2020-06-23 MED ORDER — DEXTROSE 50 % IV SOLN: 1.0000 | INTRAVENOUS | Status: DC | PRN

## 2020-06-23 MED ORDER — APIXABAN 5 MG PO TABS
5.0000 mg | ORAL_TABLET | Freq: Two times a day (BID) | ORAL | Status: DC
  Administered 2020-06-24 – 2020-06-26 (×6): 5 mg via ORAL
  Filled 2020-06-23: qty 1
  Filled 2020-06-23: qty 2
  Filled 2020-06-23 (×4): qty 1

## 2020-06-23 MED ORDER — ACETAMINOPHEN 325 MG PO TABS: 650.0000 mg | ORAL_TABLET | Freq: Four times a day (QID) | ORAL | Status: DC | PRN

## 2020-06-23 MED ORDER — ONDANSETRON HCL 4 MG/2ML IJ SOLN: 4.0000 mg | Freq: Four times a day (QID) | INTRAMUSCULAR | Status: DC | PRN

## 2020-06-23 MED ORDER — ACETAMINOPHEN 650 MG RE SUPP: 650.0000 mg | Freq: Four times a day (QID) | RECTAL | Status: DC | PRN

## 2020-06-23 MED ORDER — DEXTROSE-NACL 5-0.45 % IV SOLN: INTRAVENOUS | Status: DC

## 2020-06-23 MED ORDER — HEPARIN SODIUM (PORCINE) 5000 UNIT/ML IJ SOLN: 5000.0000 [IU] | Freq: Three times a day (TID) | INTRAMUSCULAR | Status: DC

## 2020-06-23 MED ORDER — POTASSIUM CHLORIDE 20 MEQ PO PACK
40.0000 meq | PACK | Freq: Once | ORAL | Status: AC
  Administered 2020-06-24: 40 meq via ORAL
  Filled 2020-06-23: qty 2

## 2020-06-23 NOTE — H&P (Signed)
History and Physical    Sandra Massey [id number] DOB: 12-09-1950 DOA: 06/23/2020  PCP: [name], MD  Patient coming from: [facility]  I have personally briefly reviewed patient's old medical records in [place]  Chief Complaint: Abnormal labs  HPI: Sandra Massey is a 70 y.o. 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  Patient was sent to the ED from [facility] due to blood work which showed worsening renal function with elevated BUN and creatinine compared to her baseline.  Patient reports chronic right-sided weakness and expressive aphasia due to prior stroke.  Patient reports recent urinary frequency without dysuria.  She has not had any significant nausea, vomiting, diarrhea, or abdominal pain.  She denies any chest pain or dyspnea.  She says she has had low appetite recently.  Her right ankle/foot is wrapped which she says is because of her prior right calcaneal fracture for which she was admitted 2 months ago.  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  She says her CODE STATUS is DNR.  ED Course:  Initial vitals showed BP 95/63, pulse 82, RR 19, temp 98.2 F, SPO2 98% on room air.  Labs significant for BUN 96, creatinine 3.10 (baseline creatinine of around 1.0-1.3), sodium 136, potassium 3.3, bicarb 26, serum glucose 61, WBC 94, hemoglobin 13.5, platelets 369,000.  Patient was given 1 L normal saline and the hospitalist service was consulted to admit for further evaluation and management.  Review of Systems: All systems reviewed and  are negative except as documented in history of present illness above.   Past Medical History:  Diagnosis Date  . Arthritis   . Chronic atrial fibrillation ([place])   . CKD (chronic kidney disease), stage III ([place])   . Diabetes mellitus without complication ([place])   . History of ischemic cerebrovascular accident (CVA) with residual deficit   . Hypertension   . Substance abuse [facility])     Past Surgical History:  Procedure Laterality Date  . BREAST BIOPSY Right    sebaceous cyst removed    Social History:  reports that she has never smoked. She has never used smokeless tobacco. She reports that she does not drink alcohol and does not use drugs.  Allergies  Allergen Reactions  . Latex Rash    No family history on file.   Prior to Admission medications   Medication Sig Start Date End Date Taking? Authorizing Provider  amiodarone (PACERONE) 200 MG tablet Take 1 tablet (200 mg total) by mouth 2 (two) times daily. Patient taking differently: Take 200 mg by mouth daily. 06/06/19   Loletha Grayer, MD  Ascorbic Acid (VITA-C PO) Take 1 tablet by mouth daily.    [provider]  ELIQUIS 5 MG TABS tablet Take 5 mg by mouth 2 (two) times daily. 03/11/19   [provider]  furosemide (LASIX) 20 MG tablet Take 20 mg by mouth daily.    [provider]  insulin aspart (NOVOLOG) 100 UNIT/ML FlexPen Inject 20 Units into the skin 3 (three) times daily with meals. 04/12/20   Enzo Bi, MD  insulin glargine (LANTUS) 100 UNIT/ML Solostar Pen Inject 32 Units into the  skin daily. Patient taking differently: Inject 35 Units into the skin daily. 06/07/19   Loletha Grayer, MD  Insulin Pen Needle 31G X 8 MM MISC 1 needle with each injection of insulin 06/07/19   Loletha Grayer, MD  lisinopril-hydrochlorothiazide (ZESTORETIC) 20-25 MG tablet Take 1 tablet by mouth daily.    [provider]  Multiple Vitamin (MULTIVITAMIN WITH MINERALS) TABS tablet Take 1 tablet by mouth  daily.    [provider]  rosuvastatin (CRESTOR) 40 MG tablet Take 40 mg by mouth daily. 05/06/19   [provider]  sertraline (ZOLOFT) 50 MG tablet Take 50 mg by mouth daily. 05/03/19   [provider]  spironolactone (ALDACTONE) 25 MG tablet Take 25 mg by mouth daily.    [provider]    Physical Exam: Vitals:   06/23/20 2043 06/23/20 2100 06/23/20 2141 06/23/20 2228  BP: 95/63   (!) 115/41  Pulse: 82   81  Resp: 19   16  Temp: 98.2 F (36.8 C)     TempSrc: Oral     SpO2: 98% 98%  97%  Weight:   102.1 kg   Height:   '5\' 6"'$  (1.676 m)    Constitutional: Morbidly obese woman resting supine in bed, NAD, calm, comfortable Eyes: PERRL, lids and conjunctivae normal ENMT: Mucous membranes are moist. Posterior pharynx clear of any exudate or lesions.Normal dentition.  Neck: normal, supple, no masses. Respiratory: clear to auscultation anteriorly.  Normal respiratory effort. No accessory muscle use.  Cardiovascular: Irregularly irregular, no murmurs / rubs / gallops. No extremity edema. Abdomen: Mild suprapubic and epigastric tenderness, no masses palpated. No hepatosplenomegaly. Bowel sounds positive.  Musculoskeletal: no clubbing / cyanosis. No joint deformity upper and lower extremities. ROM diminished RUE and RLE, intact LUE and LLE.  Skin: Ace wrapping in place around right ankle/foot.  No rashes, lesions, ulcers. No induration Neurologic: Exhibits intermittent expressive aphasia otherwise CN 2-12 grossly intact. Sensation intact. Strength 5/5 in LUE and LLE, 1/5 RUE and RLE.  Appears to have some intentional tremor of both hands. Psychiatric: Normal judgment and insight. Alert and oriented x 3. Normal mood.   Labs on Admission: I have personally reviewed following labs and imaging studies  CBC: Recent Labs  Lab 06/23/20 2109  WBC 9.4  NEUTROABS 6.8  HGB 13.5  HCT 41.3  MCV 86.9  PLT 0000000   Basic Metabolic Panel: Recent Labs  Lab  06/23/20 2109  NA 136  K 3.3*  CL 97*  CO2 26  GLUCOSE 61*  BUN 96*  CREATININE 3.10*  CALCIUM 9.7   GFR: Estimated Creatinine Clearance: 20.9 mL/min (A) (by C-G formula based on SCr of 3.1 mg/dL (H)). Liver Function Tests: No results for input(s): AST, ALT, ALKPHOS, BILITOT, PROT, ALBUMIN in the last 168 hours. No results for input(s): LIPASE, AMYLASE in the last 168 hours. No results for input(s): AMMONIA in the last 168 hours. Coagulation Profile: No results for input(s): INR, PROTIME in the last 168 hours. Cardiac Enzymes: No results for input(s): CKTOTAL, CKMB, CKMBINDEX, TROPONINI in the last 168 hours. BNP (last 3 results) No results for input(s): PROBNP in the last 8760 hours. HbA1C: No results for input(s): HGBA1C in the last 72 hours. CBG: No results for input(s): GLUCAP in the last 168 hours. Lipid Profile: No results for input(s): CHOL, HDL, LDLCALC, TRIG, CHOLHDL, LDLDIRECT in the last 72 hours. Thyroid Function Tests: No results for input(s): TSH, T4TOTAL, FREET4, T3FREE, THYROIDAB in the last 72 hours. Anemia Panel:  No results for input(s): VITAMINB12, FOLATE, FERRITIN, TIBC, IRON, RETICCTPCT in the last 72 hours. Urine analysis:    Component Value Date/Time   COLORURINE YELLOW (A) 04/06/2020 1945   APPEARANCEUR HAZY (A) 04/06/2020 1945   LABSPEC 1.017 04/06/2020 1945   PHURINE 7.0 04/06/2020 1945   GLUCOSEU NEGATIVE 04/06/2020 1945   HGBUR MODERATE (A) 04/06/2020 1945   BILIRUBINUR NEGATIVE 04/06/2020 1945   KETONESUR NEGATIVE 04/06/2020 1945   PROTEINUR NEGATIVE 04/06/2020 1945   NITRITE NEGATIVE 04/06/2020 1945   LEUKOCYTESUR TRACE (A) 04/06/2020 1945    Radiological Exams on Admission: No results found.  EKG: Not performed.  Assessment/Plan Principal Problem:   Acute kidney injury superimposed on chronic kidney disease ([place]) Active Problems:   Essential hypertension   Chronic atrial fibrillation (HCC)   History of ischemic cerebrovascular  accident (CVA) with residual deficit   Hypoglycemia associated with diabetes ([place])   Hypokalemia   Sandra Massey is a 70 y.o. 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  Acute kidney injury superimposed on CKD stage III: Likely multifactorial from medication effect and poor oral intake. -Continue IV fluid hydration overnight -Check renal ultrasound -Bladder scan, in and out cath as needed, strict I/O's -Obtain urinalysis -Holding home Lasix, spironolactone, lisinopril, HCTZ; avoid NSAIDs  Hypoglycemia in setting of insulin-dependent diabetes: Hold home insulin.  Start D5-1/2 NS @ 125 mL/hr overnight.  Continue hypoglycemia protocol with CBG checks q2h tonight.  Chronic atrial fibrillation: Chronic and stable with controlled rate.  Continue amiodarone and Eliquis.  Hypokalemia: Supplement orally.  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  Continue Eliquis and rosuvastatin.  Hypertension: Home lisinopril, HCTZ, spironolactone, Lasix on hold with AKI borderline blood pressures.  Hyperlipidemia: Continue rosuvastatin.  Depression: Continue sertraline.  DVT prophylaxis: Eliquis Code Status: DNR, confirmed with patient Family Communication: Discussed with patient Disposition Plan: From [facility] and likely return to same facility on discharge pending improvement in renal function Consults called: None Level of care: Med-Surg Admission status:  Status is: Observation  The patient remains OBS appropriate and will d/c before 2 midnights.  Dispo: The patient is from: SNF              Anticipated d/c is to: SNF               Patient currently is not medically stable to d/c.  Zada Finders MD [facility]  If 7PM-7AM, please contact night-coverage [email or web address]  06/23/2020, 10:30 PM

## 2020-06-23 NOTE — ED Provider Notes (Signed)
[facility] Emergency Department Provider Note   ____________________________________________   I have reviewed the triage vital signs and the nursing notes.   HISTORY  Chief Complaint AKI  History limited by: Aphagia   HPI Sandra Massey is a 70 y.o. female who presents to the emergency department today because of concern for blood work which showed elevated creatinine and BUN. The patient herself appears to have some aphagia. Per paperwork from [facility] the patient does have history of CVA. Patient denies any diarrhea or vomiting.    Records reviewed. Per medical record review patient has a history of DM, HTN.   Past Medical History:  Diagnosis Date  . Arthritis   . Diabetes mellitus without complication ([place])   . Hypertension   . Substance abuse [facility])     Patient Active Problem List   Diagnosis Date Noted  . Heel fracture 04/07/2020  . Closed right calcaneal fracture 04/06/2020  . Essential hypertension 04/06/2020  . Pressure injury of skin 06/05/2019  . Weakness   . Sepsis with acute renal failure and septic shock ([place]) 06/01/2019  . Acute kidney injury (AKI) with acute tubular necrosis (ATN) (HCC)   . Lactic acidosis   . Chronic a-fib ([place])   . Kidney stones   . DKA (diabetic ketoacidoses) 05/31/2019    Past Surgical History:  Procedure Laterality Date  . BREAST BIOPSY Right    sebaceous cyst removed    Prior to Admission medications   Medication Sig Start Date End Date Taking? Authorizing Provider  amiodarone (PACERONE) 200 MG tablet Take 1 tablet (200 mg total) by mouth 2 (two) times daily. Patient taking differently: Take 200 mg by mouth daily. 06/06/19   Loletha Grayer, MD  Ascorbic Acid (VITA-C PO) Take 1 tablet by mouth daily.    [provider]  ELIQUIS 5 MG TABS tablet Take 5 mg by mouth 2 (two) times daily. 03/11/19   [provider]  furosemide (LASIX) 20 MG tablet Take 20 mg by mouth daily.     [provider]  insulin aspart (NOVOLOG) 100 UNIT/ML FlexPen Inject 20 Units into the skin 3 (three) times daily with meals. 04/12/20   Enzo Bi, MD  insulin glargine (LANTUS) 100 UNIT/ML Solostar Pen Inject 32 Units into the skin daily. Patient taking differently: Inject 35 Units into the skin daily. 06/07/19   Loletha Grayer, MD  Insulin Pen Needle 31G X 8 MM MISC 1 needle with each injection of insulin 06/07/19   Loletha Grayer, MD  lisinopril-hydrochlorothiazide (ZESTORETIC) 20-25 MG tablet Take 1 tablet by mouth daily.    [provider]  Multiple Vitamin (MULTIVITAMIN WITH MINERALS) TABS tablet Take 1 tablet by mouth daily.    [provider]  rosuvastatin (CRESTOR) 40 MG tablet Take 40 mg by mouth daily. 05/06/19   [provider]  sertraline (ZOLOFT) 50 MG tablet Take 50 mg by mouth daily. 05/03/19   [provider]  spironolactone (ALDACTONE) 25 MG tablet Take 25 mg by mouth daily.    [provider]    Allergies Latex  No family history on file.  Social History Social History   Tobacco Use  . Smoking status: Never Smoker  . Smokeless tobacco: Never Used  . Tobacco comment: 2007 quit   Substance Use Topics  . Alcohol use: Never  . Drug use: Never    Review of Systems Limited secondary to patient having difficult time answering all questions. Constitutional: No fever/chills Cardiovascular: Denies chest pain.  Respiratory: Denies shortness of breath. Gastrointestinal:  No nausea, no vomiting.  No diarrhea.   Genitourinary: Negative for dysuria. Neurological: Negative for headaches ____________________________________________   PHYSICAL EXAM:  VITAL SIGNS: ED Triage Vitals  Enc Vitals Group     BP 06/23/20 2043 95/63     Pulse Rate 06/23/20 2043 82     Resp 06/23/20 2043 19     Temp 06/23/20 2043 98.2 F (36.8 C)     Temp Source 06/23/20 2043 Oral     SpO2 06/23/20 2043 98 %     Weight --      Height --       Head Circumference --      [place] --      Pain Score 06/23/20 2045 7   Constitutional: Awake and alert. Not completely oriented.  Eyes: Conjunctivae are normal.  ENT      Head: Normocephalic and atraumatic.      Nose: No congestion/rhinnorhea.      Mouth/Throat: Mucous membranes are moist.      Neck: No stridor. Hematological/Lymphatic/Immunilogical: No cervical lymphadenopathy. Cardiovascular: Normal rate, regular rhythm.  No murmurs, rubs, or gallops.  Respiratory: Normal respiratory effort without tachypnea nor retractions. Breath sounds are clear and equal bilaterally. No wheezes/rales/rhonchi. Gastrointestinal: Soft and non tender. No rebound. No guarding.  Genitourinary: Deferred Musculoskeletal: Normal range of motion in all extremities. No lower extremity edema. Neurologic:  Aphagia.   Skin:  Skin is warm, dry and intact. No rash noted. ____________________________________________    LABS (pertinent positives/negatives)  BMP na 136, k 3.3, glu 61, BUN 96, cr 3.10 CBC wbc 9.4, hgb 13.5, plt 369  ____________________________________________   EKG  None  ____________________________________________    RADIOLOGY  None  ____________________________________________   PROCEDURES  Procedures  ____________________________________________   INITIAL IMPRESSION / ASSESSMENT AND PLAN / ED COURSE  Pertinent labs & imaging results that were available during my care of the patient were reviewed by me and considered in my medical decision making (see chart for details).   Patient presented to the emergency department today because of concern for AKI. Blood work here does show elevated creatinine and BUN. Will start patient on IV fluids. Will plan on admission.   ____________________________________________   FINAL CLINICAL IMPRESSION(S) / ED DIAGNOSES  Final diagnoses:  AKI (acute kidney injury) ([place])     Note: This dictation was prepared with Dragon  dictation. Any transcriptional errors that result from this process are unintentional     Nance Pear, MD 06/23/20 2201

## 2020-06-23 NOTE — ED Notes (Signed)
Vital signs stable. 

## 2020-06-24 ENCOUNTER — Observation Stay: Payer: Medicare HMO

## 2020-06-24 DIAGNOSIS — E869 Volume depletion, unspecified: Secondary | ICD-10-CM | POA: Diagnosis present

## 2020-06-24 DIAGNOSIS — R350 Frequency of micturition: Secondary | ICD-10-CM | POA: Diagnosis present

## 2020-06-24 DIAGNOSIS — I69351 Hemiplegia and hemiparesis following cerebral infarction affecting right dominant side: Secondary | ICD-10-CM | POA: Diagnosis not present

## 2020-06-24 DIAGNOSIS — F32A Depression, unspecified: Secondary | ICD-10-CM | POA: Diagnosis present

## 2020-06-24 DIAGNOSIS — I129 Hypertensive chronic kidney disease with stage 1 through stage 4 chronic kidney disease, or unspecified chronic kidney disease: Secondary | ICD-10-CM | POA: Diagnosis present

## 2020-06-24 DIAGNOSIS — N189 Chronic kidney disease, unspecified: Secondary | ICD-10-CM | POA: Diagnosis not present

## 2020-06-24 DIAGNOSIS — N179 Acute kidney failure, unspecified: Secondary | ICD-10-CM | POA: Diagnosis present

## 2020-06-24 DIAGNOSIS — Z794 Long term (current) use of insulin: Secondary | ICD-10-CM | POA: Diagnosis not present

## 2020-06-24 DIAGNOSIS — E785 Hyperlipidemia, unspecified: Secondary | ICD-10-CM | POA: Diagnosis present

## 2020-06-24 DIAGNOSIS — E876 Hypokalemia: Secondary | ICD-10-CM | POA: Diagnosis present

## 2020-06-24 DIAGNOSIS — I482 Chronic atrial fibrillation, unspecified: Secondary | ICD-10-CM | POA: Diagnosis present

## 2020-06-24 DIAGNOSIS — Y92129 Unspecified place in nursing home as the place of occurrence of the external cause: Secondary | ICD-10-CM | POA: Diagnosis not present

## 2020-06-24 DIAGNOSIS — E1122 Type 2 diabetes mellitus with diabetic chronic kidney disease: Secondary | ICD-10-CM | POA: Diagnosis present

## 2020-06-24 DIAGNOSIS — T502X5A Adverse effect of carbonic-anhydrase inhibitors, benzothiadiazides and other diuretics, initial encounter: Secondary | ICD-10-CM | POA: Diagnosis present

## 2020-06-24 DIAGNOSIS — Z7901 Long term (current) use of anticoagulants: Secondary | ICD-10-CM | POA: Diagnosis not present

## 2020-06-24 DIAGNOSIS — Z91040 Latex allergy status: Secondary | ICD-10-CM | POA: Diagnosis not present

## 2020-06-24 DIAGNOSIS — Z20822 Contact with and (suspected) exposure to covid-19: Secondary | ICD-10-CM | POA: Diagnosis present

## 2020-06-24 DIAGNOSIS — Z6836 Body mass index (BMI) 36.0-36.9, adult: Secondary | ICD-10-CM | POA: Diagnosis not present

## 2020-06-24 DIAGNOSIS — I69320 Aphasia following cerebral infarction: Secondary | ICD-10-CM | POA: Diagnosis not present

## 2020-06-24 DIAGNOSIS — S92001D Unspecified fracture of right calcaneus, subsequent encounter for fracture with routine healing: Secondary | ICD-10-CM | POA: Diagnosis not present

## 2020-06-24 DIAGNOSIS — Z66 Do not resuscitate: Secondary | ICD-10-CM | POA: Diagnosis present

## 2020-06-24 DIAGNOSIS — W19XXXD Unspecified fall, subsequent encounter: Secondary | ICD-10-CM | POA: Diagnosis present

## 2020-06-24 DIAGNOSIS — Z79899 Other long term (current) drug therapy: Secondary | ICD-10-CM | POA: Diagnosis not present

## 2020-06-24 DIAGNOSIS — N200 Calculus of kidney: Secondary | ICD-10-CM | POA: Diagnosis present

## 2020-06-24 DIAGNOSIS — I69322 Dysarthria following cerebral infarction: Secondary | ICD-10-CM | POA: Diagnosis not present

## 2020-06-24 DIAGNOSIS — E11649 Type 2 diabetes mellitus with hypoglycemia without coma: Secondary | ICD-10-CM | POA: Diagnosis present

## 2020-06-24 LAB — MRSA PCR SCREENING: MRSA by PCR: NEGATIVE

## 2020-06-24 LAB — CBC
HCT: 37 % (ref 36.0–46.0)
Hemoglobin: 11.9 g/dL — ABNORMAL LOW (ref 12.0–15.0)
MCH: 28.6 pg (ref 26.0–34.0)
MCHC: 32.2 g/dL (ref 30.0–36.0)
MCV: 88.9 fL (ref 80.0–100.0)
Platelets: 310 10*3/uL (ref 150–400)
RBC: 4.16 MIL/uL (ref 3.87–5.11)
RDW: 14.4 % (ref 11.5–15.5)
WBC: 9.8 10*3/uL (ref 4.0–10.5)
nRBC: 0 % (ref 0.0–0.2)

## 2020-06-24 LAB — RESP PANEL BY RT-PCR (FLU A&B, COVID) ARPGX2
Influenza A by PCR: NEGATIVE
Influenza B by PCR: NEGATIVE
SARS Coronavirus 2 by RT PCR: NEGATIVE

## 2020-06-24 LAB — URINALYSIS, ROUTINE W REFLEX MICROSCOPIC
Bacteria, UA: NONE SEEN
Bilirubin Urine: NEGATIVE
Glucose, UA: NEGATIVE mg/dL
Ketones, ur: NEGATIVE mg/dL
Nitrite: NEGATIVE
Protein, ur: NEGATIVE mg/dL
Specific Gravity, Urine: 1.011 (ref 1.005–1.030)
pH: 5 (ref 5.0–8.0)

## 2020-06-24 LAB — BASIC METABOLIC PANEL
Anion gap: 9 (ref 5–15)
BUN: 88 mg/dL — ABNORMAL HIGH (ref 8–23)
CO2: 25 mmol/L (ref 22–32)
Calcium: 9.1 mg/dL (ref 8.9–10.3)
Chloride: 101 mmol/L (ref 98–111)
Creatinine, Ser: 2.86 mg/dL — ABNORMAL HIGH (ref 0.44–1.00)
GFR, Estimated: 17 mL/min — ABNORMAL LOW (ref >60)
Glucose, Bld: 277 mg/dL — ABNORMAL HIGH (ref 70–99)
Potassium: 4.6 mmol/L (ref 3.5–5.1)
Sodium: 135 mmol/L (ref 135–145)

## 2020-06-24 LAB — GLUCOSE, CAPILLARY: Glucose-Capillary: 257 mg/dL — ABNORMAL HIGH (ref 70–99)

## 2020-06-24 LAB — HIV ANTIBODY (ROUTINE TESTING W REFLEX): HIV Screen 4th Generation wRfx: NONREACTIVE

## 2020-06-24 LAB — CREATININE, URINE, RANDOM: Creatinine, Urine: 51 mg/dL

## 2020-06-24 LAB — CBG MONITORING, ED: Glucose-Capillary: 220 mg/dL — ABNORMAL HIGH (ref 70–99)

## 2020-06-24 LAB — SODIUM, URINE, RANDOM: Sodium, Ur: 68 mmol/L

## 2020-06-24 MED ORDER — SODIUM CHLORIDE 0.9 % IV SOLN: INTRAVENOUS | Status: DC

## 2020-06-24 NOTE — Progress Notes (Signed)
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 Luana Shu) -Rx at the time E. coli prior Prior CVA (R hemiparesis expressive aphasia) BMI 36 Chronic A. 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 Luana Shu podiatry Sandra Massey Witness  DVT prophylaxis: On Eliquis 5 twice daily Code Status: DNR Family Communication: Discussed with Sister Sandra Massey [phone] Disposition:  Status is: Observation  The patient will require care spanning > 2 midnights and should be moved to inpatient because: Hemodynamically unstable, Persistent severe electrolyte disturbances, Altered mental status and Unsafe d/c plan  Dispo: The patient is from: SNF              Anticipated d/c is to: SNF              Patient currently is not medically stable to d/c.   Difficult to place patient Yes   Subjective:  Awake alert emotionally labile Moving all 4 limbs somewhat weaker on the right side however-becomes frustrated with  that Has not eaten this morning  Objective: Vitals:   06/24/20 0121 06/24/20 0551 06/24/20 0735 06/24/20 0838  BP: (!) 102/46 (!) 91/56 (!) 95/55 92/67  Pulse: 67 (!) 58 81 (!) 58  Resp: '18 18 17 15  '$ Temp: 98.1 F (36.7 C) 97.7 F (36.5 C) 97.6 F (36.4 C) 98.3 F (36.8 C)  TempSrc: Oral Oral  Oral  SpO2: 99% 98% 94% 95%  Weight: 99.6 kg     Height: '5\' 6"'$  (1.676 m)       Intake/Output Summary (Last 24 hours) at 06/24/2020 0944 Last data filed at 06/24/2020 [id number] Gross per 24 hour  Intake 1000 ml  Output 300 ml  Net 700 ml   Filed Weights   06/23/20 2141 06/24/20 0121  Weight: 102.1 kg 99.6 kg    Examination:  EOMI NCAT Mallampati 4 no icterus no pallor Neck soft supple no JVD can be discerned S1-S2 no murmur Abdomen obese nontender no rebound 1/5 power right upper extremity 1/5 power right lower extremity She has some clawing of her right hand Sensory is grossly intact She has a bandage over her right foot that I did not disturb  Data Reviewed: personally reviewed   BUNs/creatinine baseline is 53/1.3 On admission 96/3.1-->88/2.8 Hemoglobin 13.5--[place] signed 11.9 Platelet 369-->310 Renal US is pending  Radiology Studies: No results found.   Scheduled Meds: . amiodarone  200 mg Oral BID  . apixaban  5 mg Oral BID  . rosuvastatin  40 mg Oral QPM  . sertraline  50 mg Oral Daily   Continuous Infusions: . sodium chloride       LOS: 0 days  Time spent: [place], MD [facility] To contact the attending provider between 7A-7P or the covering provider during after hours 7P-7A, please log into the web site [email or web address] and access using universal [place] password for that web site. If you do not have the password, please call the hospital operator.  06/24/2020, 9:44 AM

## 2020-06-24 NOTE — ED Notes (Signed)
Pt stable, vitals wnl, no s/s of distress noted. Pt transferred to inpatient floor.

## 2020-06-25 DIAGNOSIS — N189 Chronic kidney disease, unspecified: Secondary | ICD-10-CM | POA: Diagnosis not present

## 2020-06-25 DIAGNOSIS — N179 Acute kidney failure, unspecified: Secondary | ICD-10-CM | POA: Diagnosis not present

## 2020-06-25 LAB — COMPREHENSIVE METABOLIC PANEL
ALT: 52 U/L — ABNORMAL HIGH (ref 0–44)
AST: 33 U/L (ref 15–41)
Albumin: 2.9 g/dL — ABNORMAL LOW (ref 3.5–5.0)
Alkaline Phosphatase: 67 U/L (ref 38–126)
Anion gap: 8 (ref 5–15)
BUN: 62 mg/dL — ABNORMAL HIGH (ref 8–23)
CO2: 25 mmol/L (ref 22–32)
Calcium: 9.2 mg/dL (ref 8.9–10.3)
Chloride: 105 mmol/L (ref 98–111)
Creatinine, Ser: 2.02 mg/dL — ABNORMAL HIGH (ref 0.44–1.00)
GFR, Estimated: 26 mL/min — ABNORMAL LOW (ref >60)
Glucose, Bld: 234 mg/dL — ABNORMAL HIGH (ref 70–99)
Potassium: 4.7 mmol/L (ref 3.5–5.1)
Sodium: 138 mmol/L (ref 135–145)
Total Bilirubin: 0.6 mg/dL (ref 0.3–1.2)
Total Protein: 6.2 g/dL — ABNORMAL LOW (ref 6.5–8.1)

## 2020-06-25 LAB — CBC WITH DIFFERENTIAL/PLATELET
Abs Immature Granulocytes: 0.08 10*3/uL — ABNORMAL HIGH (ref 0.00–0.07)
Basophils Absolute: 0.1 10*3/uL (ref 0.0–0.1)
Basophils Relative: 1 %
Eosinophils Absolute: 0.2 10*3/uL (ref 0.0–0.5)
Eosinophils Relative: 2 %
HCT: 36.7 % (ref 36.0–46.0)
Hemoglobin: 11.5 g/dL — ABNORMAL LOW (ref 12.0–15.0)
Immature Granulocytes: 1 %
Lymphocytes Relative: 11 %
Lymphs Abs: 1 10*3/uL (ref 0.7–4.0)
MCH: 28.2 pg (ref 26.0–34.0)
MCHC: 31.3 g/dL (ref 30.0–36.0)
MCV: 90 fL (ref 80.0–100.0)
Monocytes Absolute: 0.6 10*3/uL (ref 0.1–1.0)
Monocytes Relative: 6 %
Neutro Abs: 7.3 10*3/uL (ref 1.7–7.7)
Neutrophils Relative %: 79 %
Platelets: 302 10*3/uL (ref 150–400)
RBC: 4.08 MIL/uL (ref 3.87–5.11)
RDW: 14.4 % (ref 11.5–15.5)
WBC: 9.2 10*3/uL (ref 4.0–10.5)
nRBC: 0 % (ref 0.0–0.2)

## 2020-06-25 LAB — MAGNESIUM: Magnesium: 1.9 mg/dL (ref 1.7–2.4)

## 2020-06-25 NOTE — Progress Notes (Signed)
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 Luana Shu) -Rx at the time E. coli prior Prior CVA (R hemiparesis expressive aphasia) BMI 36 Chronic A. fib CHADS2 score >4 on amiodarone Eliquis  [facility] secondary to elevated BUN/creatinine Her creatinine has improved to during hospital stay She cannot discharge on the holiday weekend according to social worker  Hospital-Problem based course  AKI Continue volume repletion with saline 150 cc/H-->75 cc/H Renal ultrasound 4/16 = no hydronephrosis, nonobstructive right renal stone 10 mm + L renal cyst Holding Zestoretic as well as Aldactone at this time and may need outpatient reimplementation Atrial fibrillation CHADS2 score >6 with prior CVA on right side on Eliquis Continue amiodarone 200 twice daily Outpatient LFT TSH Prior CVA Not a candidate for antiplatelet 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 Luana Shu podiatry [place]  DVT prophylaxis: On Eliquis 5 twice daily Code Status: DNR Family Communication: Discussed with Brandy Hale [phone] on 06/25/2020 Disposition:  Status is: Observation  The patient will require care spanning > 2 midnights and should be moved to inpatient because: Hemodynamically unstable, Persistent severe electrolyte disturbances, Altered mental status and Unsafe d/c plan  Dispo: The patient is from: SNF              Anticipated d/c is to: SNF              Patient  currently is not medically stable to d/c.   Difficult to place patient Yes   Subjective:  Remains emotional needs assistance with IADL  Objective: Vitals:   06/24/20 2132 06/24/20 2346 06/25/20 0436 06/25/20 0848  BP: (!) 102/44 (!) 118/58 110/64 133/83  Pulse: (!) 48 (!) 58 94 61  Resp:  '16 18 18  '$ Temp:  97.7 F (36.5 C) 98.5 F (36.9 C) 98.2 F (36.8 C)  TempSrc:      SpO2:  95% 98% 96%  Weight:      Height:        Intake/Output Summary (Last 24 hours) at 06/25/2020 1101 Last data filed at 06/25/2020 1014 Gross per 24 hour  Intake 3444.7 ml  Output 2300 ml  Net 1144.7 ml   Filed Weights   06/23/20 2141 06/24/20 0121  Weight: 102.1 kg 99.6 kg    Examination:  EOMI NCAT Mallampati 4 no icterus no pallor Neck soft supple no JVD can be discerned S1-S2 no murmur Abdomen obese nontender no rebound Power remains diminished on right side-she is able to move her left upper extremity well to feed herself Sensory is grossly intact Bandage over right foot removed and patient does not have any breakdown  Data Reviewed: personally reviewed   BUNs/creatinine baseline is 53/1.3 On admission 96/3.1-->88/2.8--> 62/2.02 White count 9.2 Hemoglobin 11 Renal US 4/16 as above  Radiology Studies: US RENAL  Result Date: 06/24/2020 CLINICAL DATA:  Acute kidney injury EXAM: RENAL / URINARY TRACT ULTRASOUND COMPLETE COMPARISON:  None. FINDINGS: Right Kidney: Renal measurements: 10.2 x 5.2 x 5.3 cm = volume: 146 mL. Cortex is normal in thickness and echogenicity. 10 mm  stone within the RIGHT renal pelvis. No associated hydronephrosis. Left Kidney: Renal measurements: 9.6 x 5.9 x 4.5 cm = volume: 134 mL. Multiple renal cysts, largest measuring 2.1 cm. No suspicious mass, stone or hydronephrosis. Bladder: Appears normal for degree of bladder distention. Other: None. IMPRESSION: 1. No acute findings.  No hydronephrosis. 2. Nonobstructing RIGHT renal stone measuring approximately 10 mm. 3.  LEFT renal cysts. Electronically Signed   By: Franki Cabot M.D.   On: 06/24/2020 10:53     Scheduled Meds: . amiodarone  200 mg Oral BID  . apixaban  5 mg Oral BID  . rosuvastatin  40 mg Oral QPM  . sertraline  50 mg Oral Daily   Continuous Infusions: . sodium chloride 75 mL/hr at 06/25/20 1014     LOS: 1 day   Time spent: 15 minutes  Nita Sells, MD [facility] To contact the attending provider between 7A-7P or the covering provider during after hours 7P-7A, please log into the web site [email or web address] and access using universal [place] password for that web site. If you do not have the password, please call the hospital operator.  06/25/2020, 11:01 AM

## 2020-06-26 DIAGNOSIS — N189 Chronic kidney disease, unspecified: Secondary | ICD-10-CM | POA: Diagnosis not present

## 2020-06-26 DIAGNOSIS — N179 Acute kidney failure, unspecified: Secondary | ICD-10-CM | POA: Diagnosis not present

## 2020-06-26 LAB — CBC WITH DIFFERENTIAL/PLATELET
Abs Immature Granulocytes: 0.06 10*3/uL (ref 0.00–0.07)
Basophils Absolute: 0.1 10*3/uL (ref 0.0–0.1)
Basophils Relative: 1 %
Eosinophils Absolute: 0.3 10*3/uL (ref 0.0–0.5)
Eosinophils Relative: 3 %
HCT: 34.5 % — ABNORMAL LOW (ref 36.0–46.0)
Hemoglobin: 10.8 g/dL — ABNORMAL LOW (ref 12.0–15.0)
Immature Granulocytes: 1 %
Lymphocytes Relative: 16 %
Lymphs Abs: 1.3 10*3/uL (ref 0.7–4.0)
MCH: 28.3 pg (ref 26.0–34.0)
MCHC: 31.3 g/dL (ref 30.0–36.0)
MCV: 90.3 fL (ref 80.0–100.0)
Monocytes Absolute: 0.6 10*3/uL (ref 0.1–1.0)
Monocytes Relative: 8 %
Neutro Abs: 5.6 10*3/uL (ref 1.7–7.7)
Neutrophils Relative %: 71 %
Platelets: 298 10*3/uL (ref 150–400)
RBC: 3.82 MIL/uL — ABNORMAL LOW (ref 3.87–5.11)
RDW: 14.6 % (ref 11.5–15.5)
WBC: 7.9 10*3/uL (ref 4.0–10.5)
nRBC: 0 % (ref 0.0–0.2)

## 2020-06-26 LAB — COMPREHENSIVE METABOLIC PANEL
ALT: 46 U/L — ABNORMAL HIGH (ref 0–44)
AST: 28 U/L (ref 15–41)
Albumin: 2.6 g/dL — ABNORMAL LOW (ref 3.5–5.0)
Alkaline Phosphatase: 62 U/L (ref 38–126)
Anion gap: 6 (ref 5–15)
BUN: 43 mg/dL — ABNORMAL HIGH (ref 8–23)
CO2: 23 mmol/L (ref 22–32)
Calcium: 8.9 mg/dL (ref 8.9–10.3)
Chloride: 110 mmol/L (ref 98–111)
Creatinine, Ser: 1.58 mg/dL — ABNORMAL HIGH (ref 0.44–1.00)
GFR, Estimated: 35 mL/min — ABNORMAL LOW (ref >60)
Glucose, Bld: 227 mg/dL — ABNORMAL HIGH (ref 70–99)
Potassium: 4.6 mmol/L (ref 3.5–5.1)
Sodium: 139 mmol/L (ref 135–145)
Total Bilirubin: 0.5 mg/dL (ref 0.3–1.2)
Total Protein: 5.9 g/dL — ABNORMAL LOW (ref 6.5–8.1)

## 2020-06-26 LAB — RESP PANEL BY RT-PCR (FLU A&B, COVID) ARPGX2
Influenza A by PCR: NEGATIVE
Influenza B by PCR: NEGATIVE
SARS Coronavirus 2 by RT PCR: NEGATIVE

## 2020-06-26 LAB — HEMOGLOBIN A1C
Hgb A1c MFr Bld: 10.7 % — ABNORMAL HIGH (ref 4.8–5.6)
Mean Plasma Glucose: 260 mg/dL

## 2020-06-26 MED ORDER — AMLODIPINE BESYLATE 10 MG PO TABS: 10.0000 mg | ORAL_TABLET | Freq: Every day | ORAL | 11 refills | Status: DC

## 2020-06-26 MED ORDER — INSULIN GLARGINE 100 UNIT/ML SOLOSTAR PEN: 32.0000 [IU] | PEN_INJECTOR | Freq: Every day | SUBCUTANEOUS | Status: DC

## 2020-06-26 MED ORDER — SERTRALINE HCL 50 MG PO TABS: 50.0000 mg | ORAL_TABLET | Freq: Every day | ORAL | 0 refills | Status: DC

## 2020-06-26 NOTE — Discharge Summary (Signed)
Physician Discharge Summary  Sandra Massey [id number] DOB: 1950/12/08 DOA: 06/23/2020  PCP: Sandra Carls, MD  Admit date: 06/23/2020 Discharge date: 06/26/2020  Time spent: 21 minutes  Recommendations for Outpatient Follow-up:  1. Completely stop all diuretic type antihypertensives 2. New medication amlodipine-no change in dose of long-acting insulin 3. Recommend outpatient stroke care follow-up 4. Recommend outpatient follow-up with podiatrist Dr. 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 Luana Massey) -Rx at the time E. coli prior Prior CVA (R hemiparesis expressive aphasia) BMI 36 Chronic A. fib CHADS2 score >4 on amiodarone Eliquis  [facility] secondary to elevated BUN/creatinine Her creatinine has improved to during hospital stay She cannot discharge on the holiday weekend according to social worker   Hospital Course:  AKI Continue volume repletion with saline 150 cc/H-->75 cc/H Renal ultrasound 4/16 = no hydronephrosis, nonobstructive right renal stone 10 mm + L renal cyst Discontinue Lasix Aldactone Zestoretic this admission Atrial fibrillation CHADS2 score >6 with prior CVA on right side on Eliquis Continue amiodarone 200 twice  daily Outpatient LFT TSH HTN Started amlodipine 10 this hospital stay Prior CVA Not a candidate for antiplatelet Poorly controlled DM TY 2 neuropathy nephropathy Continue sliding scale coverage Lantus 32 units changed to 10 units Recent nonoperative right calcaneal fracture Outpatient follow-up with Dr. Luana Massey podiatry Sandra Massey Massey  Procedures: None Consultations:  None  Discharge Exam: Vitals:   06/26/20 0439 06/26/20 0846  BP: 120/63 (!) 141/78  Pulse: (!) 55 100  Resp: 16 18  Temp: 97.6 F (36.4 C) 98.4 F (36.9 C)  SpO2: 98% 90%    Subj on day of d/c   Awake alert coherent Eating and drinking better than prior No chills no rigors Discussed with RN patient seems to be at her usual baseline  General Exam on discharge  Thick neck Mallampati 4 external ocular movements intact Dense deficits on the right side which are chronic with clawing of the right hand S1-S2 no murmur no rub no gallop Chest clear Abdomen obese nontender no rebound Psych euthymic  Discharge Instructions   Discharge Instructions    Diet - low sodium heart healthy   Complete by: As directed    Increase activity slowly   Complete by: As directed      Allergies as of 06/26/2020      Reactions   Latex Rash      Medication List    STOP taking these medications   furosemide 20 MG tablet Commonly known as: LASIX   lisinopril-hydrochlorothiazide 20-25 MG tablet Commonly known as: ZESTORETIC   spironolactone 25 MG tablet Commonly known as: ALDACTONE     TAKE these medications   amiodarone 200 MG tablet Commonly known as: PACERONE Take 1 tablet (200 mg total) by mouth 2 (two) times daily.   amLODipine 10 MG tablet Commonly known  as: NORVASC Take 1 tablet (10 mg total) by mouth daily.   ascorbic acid 500 MG tablet Commonly known as: VITAMIN C Take 500 mg by mouth daily.   Eliquis 5 MG Tabs tablet Generic drug: apixaban Take 5 mg by mouth 2 (two) times daily.    insulin aspart 100 UNIT/ML FlexPen Commonly known as: NOVOLOG Inject 20 Units into the skin 3 (three) times daily with meals.   insulin glargine 100 UNIT/ML Solostar Pen Commonly known as: LANTUS Inject 32 Units into the skin daily.   Insulin Pen Needle 31G X 8 MM Misc 1 needle with each injection of insulin   multivitamin with minerals Tabs tablet Take 1 tablet by mouth daily.   rosuvastatin 40 MG tablet Commonly known as: CRESTOR Take 40 mg by mouth every evening.   sertraline 50 MG tablet Commonly known as: ZOLOFT Take 1 tablet (50 mg total) by mouth daily.      Allergies  Allergen Reactions  . Latex Rash      The results of significant diagnostics from this hospitalization (including imaging, microbiology, ancillary and laboratory) are listed below for reference.    Significant Diagnostic Studies: US RENAL  Result Date: 06/24/2020 CLINICAL DATA:  Acute kidney injury EXAM: RENAL / URINARY TRACT ULTRASOUND COMPLETE COMPARISON:  None. FINDINGS: Right Kidney: Renal measurements: 10.2 x 5.2 x 5.3 cm = volume: 146 mL. Cortex is normal in thickness and echogenicity. 10 mm stone within the RIGHT renal pelvis. No associated hydronephrosis. Left Kidney: Renal measurements: 9.6 x 5.9 x 4.5 cm = volume: 134 mL. Multiple renal cysts, largest measuring 2.1 cm. No suspicious mass, stone or hydronephrosis. Bladder: Appears normal for degree of bladder distention. Other: None. IMPRESSION: 1. No acute findings.  No hydronephrosis. 2. Nonobstructing RIGHT renal stone measuring approximately 10 mm. 3. LEFT renal cysts. Electronically Signed   By: Franki Cabot M.D.   On: 06/24/2020 10:53    Microbiology: Recent Results (from the past 240 hour(s))  Resp Panel by RT-PCR (Flu A&B, Covid) Nasopharyngeal Swab     Status: None   Collection Time: 06/23/20 11:30 PM   Specimen: Nasopharyngeal Swab; Nasopharyngeal(NP) swabs in vial transport medium  Result Value Ref Range Status   SARS  Coronavirus 2 by RT PCR NEGATIVE NEGATIVE Final    Comment: (NOTE) SARS-CoV-2 target nucleic acids are NOT DETECTED.  The SARS-CoV-2 RNA is generally detectable in upper respiratory specimens during the acute phase of infection. The lowest concentration of SARS-CoV-2 viral copies this assay can detect is 138 copies/mL. A negative result does not preclude SARS-Cov-2 infection and should not be used as the sole basis for treatment or other patient management decisions. A negative result may occur with  improper specimen collection/handling, submission of specimen other than nasopharyngeal swab, presence of viral mutation(s) within the areas targeted by this assay, and inadequate number of viral copies(<138 copies/mL). A negative result must be combined with clinical observations, patient history, and epidemiological information. The expected result is Negative.  Fact Sheet for Patients:  EntrepreneurPulse.com.au  Fact Sheet for Healthcare Providers:  IncredibleEmployment.be  This test is no t yet approved or cleared by the [place] FDA and  has been authorized for detection and/or diagnosis of SARS-CoV-2 by FDA under an Emergency Use Authorization (EUA). This EUA will remain  in effect (meaning this test can be used) for the duration of the COVID-19 declaration under Section 564(b)(1) of the Act, 21 U.S.C.section 360bbb-3(b)(1), unless the authorization is terminated  or revoked sooner.  Influenza A by PCR NEGATIVE NEGATIVE Final   Influenza B by PCR NEGATIVE NEGATIVE Final    Comment: (NOTE) The Xpert Xpress SARS-CoV-2/FLU/RSV plus assay is intended as an aid in the diagnosis of influenza from Nasopharyngeal swab specimens and should not be used as a sole basis for treatment. Nasal washings and aspirates are unacceptable for Xpert Xpress SARS-CoV-2/FLU/RSV testing.  Fact Sheet for  Patients: EntrepreneurPulse.com.au  Fact Sheet for Healthcare Providers: IncredibleEmployment.be  This test is not yet approved or cleared by the [place] FDA and has been authorized for detection and/or diagnosis of SARS-CoV-2 by FDA under an Emergency Use Authorization (EUA). This EUA will remain in effect (meaning this test can be used) for the duration of the COVID-19 declaration under Section 564(b)(1) of the Act, 21 U.S.C. section 360bbb-3(b)(1), unless the authorization is terminated or revoked.  Performed at [facility], [address] [place], [place] [address]   MRSA PCR Screening     Status: None   Collection Time: 06/24/20  8:20 PM   Specimen: Nasopharyngeal  Result Value Ref Range Status   MRSA by PCR NEGATIVE NEGATIVE Final    Comment:        The GeneXpert MRSA Assay (FDA approved for NASAL specimens only), is one component of a comprehensive MRSA colonization surveillance program. It is not intended to diagnose MRSA infection nor to guide or monitor treatment for MRSA infections. Performed at [facility], [address] [place], [place] [address]      Labs: Basic Metabolic Panel: Recent Labs  Lab 06/23/20 2109 06/24/20 0545 06/25/20 0421 06/26/20 0417  NA 136 135 138 139  K 3.3* 4.6 4.7 4.6  CL 97* 101 105 110  CO2 '26 25 25 23  '$ GLUCOSE 61* 277* 234* 227*  BUN 96* 88* 62* 43*  CREATININE 3.10* 2.86* 2.02* 1.58*  CALCIUM 9.7 9.1 9.2 8.9  MG  --   --  1.9  --    Liver Function Tests: Recent Labs  Lab 06/25/20 0421 06/26/20 0417  AST 33 28  ALT 52* 46*  ALKPHOS 67 62  BILITOT 0.6 0.5  PROT 6.2* 5.9*  ALBUMIN 2.9* 2.6*   No results for input(s): LIPASE, AMYLASE in the last 168 hours. No results for input(s): AMMONIA in the last 168 hours. CBC: Recent Labs  Lab 06/23/20 2109 06/24/20 0545 06/25/20 0421 06/26/20 0417  WBC 9.4 9.8 9.2 7.9  NEUTROABS 6.8  --  7.3 5.6  HGB  13.5 11.9* 11.5* 10.8*  HCT 41.3 37.0 36.7 34.5*  MCV 86.9 88.9 90.0 90.3  PLT 369 310 302 298   Cardiac Enzymes: No results for input(s): CKTOTAL, CKMB, CKMBINDEX, TROPONINI in the last 168 hours. BNP: BNP (last 3 results) Recent Labs    04/06/20 2240  BNP 201.4*    ProBNP (last 3 results) No results for input(s): PROBNP in the last 8760 hours.  CBG: Recent Labs  Lab 06/24/20 0102 06/24/20 0550  GLUCAP 220* 257*       Signed:  Nita Sells MD   [facility] 06/26/2020, 9:46 AM

## 2020-06-26 NOTE — NC FL2 (Signed)
[place] LEVEL OF CARE SCREENING TOOL     IDENTIFICATION  Patient Name: Sandra Massey Birthdate: Aug 27, 1950 Sex: female Admission Date (Current Location): 06/23/2020  [place] and [place] Number:  Engineering geologist and Address:  [facility], [address], [place], [place] [address]      Provider Number: [id number]  Attending Physician Name and Address:  Nita Sells, MD  Relative Name and Phone Number:  Tobitha Overton (sister) [phone]    Current Level of Care: Hospital Recommended Level of Care: [place] Prior Approval Number:    Date Approved/Denied:   PASRR Number: [id number]  Discharge Plan: SNF    Current Diagnoses: Patient Active Problem List   Diagnosis Date Noted  . AKI (acute kidney injury) ([place]) 06/24/2020  . Acute kidney injury superimposed on chronic kidney disease ([place]) 06/23/2020  . Hypoglycemia associated with diabetes ([place]) 06/23/2020  . Hypokalemia 06/23/2020  . Chronic atrial fibrillation ([place])   . CKD (chronic kidney disease), stage III ([place])   . History of ischemic cerebrovascular accident (CVA) with residual deficit   . Heel fracture 04/07/2020  . Closed right calcaneal fracture 04/06/2020  . Essential hypertension 04/06/2020  . Pressure injury of skin 06/05/2019  . Weakness   . Sepsis with acute renal failure and septic shock ([place]) 06/01/2019  . Acute kidney injury (AKI) with acute tubular necrosis (ATN) (HCC)   . Lactic acidosis   . Chronic a-fib ([place])   . Kidney stones   . DKA (diabetic ketoacidoses) 05/31/2019    Orientation RESPIRATION BLADDER Height & Weight     Self,Place  Normal External catheter Weight: 99.6 kg Height:  '5\' 6"'$  (167.6 cm)  BEHAVIORAL SYMPTOMS/MOOD NEUROLOGICAL BOWEL NUTRITION STATUS      Incontinent Diet (Heart healthy)  AMBULATORY STATUS COMMUNICATION OF NEEDS Skin   Extensive Assist Verbally (expresive aphagia) Other (Comment) (open skin to  sacrum- foam dressing check daily)                       Personal Care Assistance Level of Assistance  Bathing,Feeding,Dressing Bathing Assistance: Maximum assistance Feeding assistance: Limited assistance Dressing Assistance: Maximum assistance     Functional Limitations Info             SPECIAL CARE FACTORS FREQUENCY                       Contractures Contractures Info: Not present    Additional Factors Info  Code Status,Allergies Code Status Info: DNR Allergies Info: Latex           Current Medications (06/26/2020):  This is the current hospital active medication list Current Facility-Administered Medications  Medication Dose Route Frequency Provider Last Rate Last Admin  . acetaminophen (TYLENOL) tablet 650 mg  650 mg Oral Q6H PRN Lenore Cordia, MD       Or  . acetaminophen (TYLENOL) suppository 650 mg  650 mg Rectal Q6H PRN Zada Finders R, MD      . amiodarone (PACERONE) tablet 200 mg  200 mg Oral BID Zada Finders R, MD   200 mg at 06/25/20 2057  . apixaban (ELIQUIS) tablet 5 mg  5 mg Oral BID Lenore Cordia, MD   5 mg at 06/25/20 2058  . dextrose 50 % solution 50 mL  1 ampule Intravenous PRN Zada Finders R, MD      . ondansetron (ZOFRAN) tablet 4 mg  4 mg Oral Q6H PRN  Lenore Cordia, MD       Or  . ondansetron [facility]) injection 4 mg  4 mg Intravenous Q6H PRN Zada Finders R, MD      . rosuvastatin (CRESTOR) tablet 40 mg  40 mg Oral QPM Lenore Cordia, MD   40 mg at 06/25/20 2057  . senna-docusate (Senokot-S) tablet 1 tablet  1 tablet Oral QHS PRN Zada Finders R, MD      . sertraline (ZOLOFT) tablet 50 mg  50 mg Oral Daily Lenore Cordia, MD   50 mg at 06/25/20 1014     Discharge Medications: Please see discharge summary for a list of discharge medications.  Relevant Imaging Results:  Relevant Lab Results:   Additional Information SS: [id number]  Shelbie Hutching, RN

## 2020-06-26 NOTE — TOC Initial Note (Signed)
Transition of Care [facility]) - Initial/Assessment Note    Patient Details  Name: Sandra Massey MRN: [id number] Date of Birth: 08/29/1950  Transition of Care [facility]) CM/SW Contact:    Shelbie Hutching, RN Phone Number: 06/26/2020, 9:10 AM  Clinical Narrative:                 Patient admitted to the hospital with acute kidney injury with chronic kidney disease.  Patient is from [facility].  Plan for discharge will be for patient to return when medically stable.    Expected Discharge Plan: Skilled Nursing Facility Barriers to Discharge: Continued Medical Work up   Patient Goals and CMS Choice Patient states their goals for this hospitalization and ongoing recovery are:: [facility] to [facility] to [facility] CMS Medicare.gov Compare Post Acute Care list provided to:: Patient Choice offered to / list presented to : Patient  Expected Discharge Plan and Services Expected Discharge Plan: [place]   Discharge Planning Services: CM Consult Post Acute Care Choice: Skilled Nursing Facility,Resumption of Svcs/PTA Provider Living arrangements for the past 2 months: [place]                 DME Arranged: N/A DME Agency: NA       HH Arranged: NA          Prior Living Arrangements/Services Living arrangements for the past 2 months: [place] Lives with:: Facility Resident Patient language and need for interpreter reviewed:: Yes Do you feel safe going back to the place where you live?: Yes      Need for Family Participation in Patient Care: Yes (Comment) (previous CVA) Care giver support system in place?: Yes (comment) (sisters)   Criminal Activity/Legal Involvement Pertinent to Current Situation/Hospitalization: No - Comment as needed  Activities of Daily Living      Permission Sought/Granted Permission sought to share information with : Case Manager,Family Chief Financial Officer Permission granted to share  information with : Yes, Verbal Permission Granted  Share Information with NAME: Eriko Obrion  Permission granted to share info w AGENCY: [facility]  Permission granted to share info w Relationship: sister     Emotional Assessment       Orientation: : Oriented to Self,Oriented to Place Alcohol / Substance Use: Not Applicable Psych Involvement: No (comment)  Admission diagnosis:  AKI (acute kidney injury) ([place]) [N17.9] Acute kidney injury superimposed on chronic kidney disease ([place]) [N17.9, N18.9] Patient Active Problem List   Diagnosis Date Noted  . AKI (acute kidney injury) ([place]) 06/24/2020  . Acute kidney injury superimposed on chronic kidney disease ([place]) 06/23/2020  . Hypoglycemia associated with diabetes ([place]) 06/23/2020  . Hypokalemia 06/23/2020  . Chronic atrial fibrillation ([place])   . CKD (chronic kidney disease), stage III ([place])   . History of ischemic cerebrovascular accident (CVA) with residual deficit   . Heel fracture 04/07/2020  . Closed right calcaneal fracture 04/06/2020  . Essential hypertension 04/06/2020  . Pressure injury of skin 06/05/2019  . Weakness   . Sepsis with acute renal failure and septic shock ([place]) 06/01/2019  . Acute kidney injury (AKI) with acute tubular necrosis (ATN) (HCC)   . Lactic acidosis   . Chronic a-fib ([place])   . Kidney stones   . DKA (diabetic ketoacidoses) 05/31/2019   PCP:  [name], MD Pharmacy:   [facility] [address] ([address]), [place] [address] [place] [address] Phone: [phone]  Fax: [phone]  [facility] [address] [place] [address] Phone: [phone]      Social Determinants of Health (SDOH) Interventions    Readmission Risk Interventions No flowsheet data found.

## 2020-06-26 NOTE — TOC Progression Note (Signed)
Transition of Care [facility]) - Progression Note    Patient Details  Name: Sandra Massey MRN: [id number] Date of Birth: 05-Aug-1950  Transition of Care [facility]) CM/SW Contact  Shelbie Hutching, RN Phone Number: 06/26/2020, 2:04 PM  Clinical Narrative:    EMS arranged.  Patient is next up on the list for pick up.    Expected Discharge Plan: [place] Barriers to Discharge: Barriers Resolved  Expected Discharge Plan and Services Expected Discharge Plan: [place]   Discharge Planning Services: CM Consult Post Acute Care Choice: Skilled Nursing Facility,Resumption of Svcs/PTA Provider Living arrangements for the past 2 months: [place] Expected Discharge Date: 06/26/20               DME Arranged: N/A DME Agency: NA       HH Arranged: NA           Social Determinants of Health (SDOH) Interventions    Readmission Risk Interventions No flowsheet data found.

## 2020-06-26 NOTE — TOC Transition Note (Signed)
Transition of Care [facility]) - CM/SW Discharge Note   Patient Details  Name: Sandra Massey MRN: [id number] Date of Birth: 02-19-1951  Transition of Care [facility]) CM/SW Contact:  Shelbie Hutching, RN Phone Number: 06/26/2020, 10:51 AM   Clinical Narrative:    Patient is medically cleared for discharge to [facility] today.  [facility] is requesting a new COVID test to be collected before patient comes back, order entered for Rapid COVID.  Once test results come back RNCM will arrange EMS transport. Family has been updated on discharge today.    Final next level of care: Skilled Nursing Facility Barriers to Discharge: Barriers Resolved   Patient Goals and CMS Choice Patient states their goals for this hospitalization and ongoing recovery are:: [facility] CMS Medicare.gov Compare Post Acute Care list provided to:: Patient Choice offered to / list presented to : Patient  Discharge Placement   Existing PASRR number confirmed : 06/26/20          Patient chooses bed at: [facility] [place] Patient to be transferred to facility by: [facility] Name of family member notified: Madalena Vanhorne Patient and family notified of of transfer: 06/26/20  Discharge Plan and Services   Discharge Planning Services: CM Consult Post Acute Care Choice: Skilled Nursing Facility,Resumption of Svcs/PTA Provider          DME Arranged: N/A DME Agency: NA       HH Arranged: NA          Social Determinants of Health (SDOH) Interventions     Readmission Risk Interventions No flowsheet data found.

## 2020-06-26 NOTE — Plan of Care (Signed)

## 2021-01-09 ENCOUNTER — Observation Stay: Payer: Medicare HMO

## 2021-01-09 ENCOUNTER — Emergency Department: Payer: Medicare HMO

## 2021-01-09 ENCOUNTER — Other Ambulatory Visit: Payer: Self-pay

## 2021-01-09 ENCOUNTER — Inpatient Hospital Stay
Admission: EM | Admit: 2021-01-09 | Discharge: 2021-01-29 | DRG: 871 | Disposition: A | Discharge status: Skilled Nursing Facility | Payer: Medicare HMO | Source: Skilled Nursing Facility | Attending: Internal Medicine | Admitting: Internal Medicine

## 2021-01-09 DIAGNOSIS — R34 Anuria and oliguria: Secondary | ICD-10-CM | POA: Diagnosis not present

## 2021-01-09 DIAGNOSIS — L8990 Pressure ulcer of unspecified site, unspecified stage: Secondary | ICD-10-CM | POA: Diagnosis present

## 2021-01-09 DIAGNOSIS — G9341 Metabolic encephalopathy: Secondary | ICD-10-CM | POA: Diagnosis present

## 2021-01-09 DIAGNOSIS — A419 Sepsis, unspecified organism: Secondary | ICD-10-CM | POA: Diagnosis present

## 2021-01-09 DIAGNOSIS — Z20822 Contact with and (suspected) exposure to covid-19: Secondary | ICD-10-CM | POA: Diagnosis present

## 2021-01-09 DIAGNOSIS — E1151 Type 2 diabetes mellitus with diabetic peripheral angiopathy without gangrene: Secondary | ICD-10-CM | POA: Diagnosis present

## 2021-01-09 DIAGNOSIS — Z7401 Bed confinement status: Secondary | ICD-10-CM

## 2021-01-09 DIAGNOSIS — R4182 Altered mental status, unspecified: Secondary | ICD-10-CM

## 2021-01-09 DIAGNOSIS — Z6841 Body Mass Index (BMI) 40.0 and over, adult: Secondary | ICD-10-CM | POA: Diagnosis not present

## 2021-01-09 DIAGNOSIS — D631 Anemia in chronic kidney disease: Secondary | ICD-10-CM | POA: Diagnosis present

## 2021-01-09 DIAGNOSIS — J9602 Acute respiratory failure with hypercapnia: Secondary | ICD-10-CM | POA: Diagnosis not present

## 2021-01-09 DIAGNOSIS — N390 Urinary tract infection, site not specified: Secondary | ICD-10-CM | POA: Diagnosis present

## 2021-01-09 DIAGNOSIS — D509 Iron deficiency anemia, unspecified: Secondary | ICD-10-CM | POA: Diagnosis present

## 2021-01-09 DIAGNOSIS — R410 Disorientation, unspecified: Secondary | ICD-10-CM | POA: Diagnosis not present

## 2021-01-09 DIAGNOSIS — R7401 Elevation of levels of liver transaminase levels: Secondary | ICD-10-CM | POA: Diagnosis present

## 2021-01-09 DIAGNOSIS — Z66 Do not resuscitate: Secondary | ICD-10-CM | POA: Diagnosis present

## 2021-01-09 DIAGNOSIS — E1122 Type 2 diabetes mellitus with diabetic chronic kidney disease: Secondary | ICD-10-CM | POA: Diagnosis present

## 2021-01-09 DIAGNOSIS — N170 Acute kidney failure with tubular necrosis: Secondary | ICD-10-CM | POA: Diagnosis present

## 2021-01-09 DIAGNOSIS — R6520 Severe sepsis without septic shock: Secondary | ICD-10-CM | POA: Diagnosis not present

## 2021-01-09 DIAGNOSIS — I6930 Unspecified sequelae of cerebral infarction: Secondary | ICD-10-CM

## 2021-01-09 DIAGNOSIS — N186 End stage renal disease: Secondary | ICD-10-CM | POA: Diagnosis not present

## 2021-01-09 DIAGNOSIS — Z5309 Procedure and treatment not carried out because of other contraindication: Secondary | ICD-10-CM

## 2021-01-09 DIAGNOSIS — R4701 Aphasia: Secondary | ICD-10-CM | POA: Diagnosis not present

## 2021-01-09 DIAGNOSIS — Z91199 Patient's noncompliance with other medical treatment and regimen due to unspecified reason: Secondary | ICD-10-CM

## 2021-01-09 DIAGNOSIS — R569 Unspecified convulsions: Secondary | ICD-10-CM

## 2021-01-09 DIAGNOSIS — N189 Chronic kidney disease, unspecified: Secondary | ICD-10-CM | POA: Diagnosis present

## 2021-01-09 DIAGNOSIS — N12 Tubulo-interstitial nephritis, not specified as acute or chronic: Secondary | ICD-10-CM | POA: Diagnosis not present

## 2021-01-09 DIAGNOSIS — R778 Other specified abnormalities of plasma proteins: Secondary | ICD-10-CM | POA: Diagnosis present

## 2021-01-09 DIAGNOSIS — I69351 Hemiplegia and hemiparesis following cerebral infarction affecting right dominant side: Secondary | ICD-10-CM | POA: Diagnosis not present

## 2021-01-09 DIAGNOSIS — E669 Obesity, unspecified: Secondary | ICD-10-CM | POA: Diagnosis present

## 2021-01-09 DIAGNOSIS — A4150 Gram-negative sepsis, unspecified: Secondary | ICD-10-CM | POA: Diagnosis not present

## 2021-01-09 DIAGNOSIS — R0902 Hypoxemia: Secondary | ICD-10-CM | POA: Diagnosis present

## 2021-01-09 DIAGNOSIS — E874 Mixed disorder of acid-base balance: Secondary | ICD-10-CM | POA: Diagnosis present

## 2021-01-09 DIAGNOSIS — Z79899 Other long term (current) drug therapy: Secondary | ICD-10-CM

## 2021-01-09 DIAGNOSIS — B9620 Unspecified Escherichia coli [E. coli] as the cause of diseases classified elsewhere: Secondary | ICD-10-CM | POA: Diagnosis not present

## 2021-01-09 DIAGNOSIS — I482 Chronic atrial fibrillation, unspecified: Secondary | ICD-10-CM | POA: Diagnosis present

## 2021-01-09 DIAGNOSIS — Z7984 Long term (current) use of oral hypoglycemic drugs: Secondary | ICD-10-CM

## 2021-01-09 DIAGNOSIS — R791 Abnormal coagulation profile: Secondary | ICD-10-CM | POA: Diagnosis present

## 2021-01-09 DIAGNOSIS — R7881 Bacteremia: Secondary | ICD-10-CM | POA: Diagnosis not present

## 2021-01-09 DIAGNOSIS — G40909 Epilepsy, unspecified, not intractable, without status epilepticus: Secondary | ICD-10-CM | POA: Diagnosis present

## 2021-01-09 DIAGNOSIS — I639 Cerebral infarction, unspecified: Secondary | ICD-10-CM

## 2021-01-09 DIAGNOSIS — L89326 Pressure-induced deep tissue damage of left buttock: Secondary | ICD-10-CM | POA: Diagnosis not present

## 2021-01-09 DIAGNOSIS — L89316 Pressure-induced deep tissue damage of right buttock: Secondary | ICD-10-CM | POA: Diagnosis not present

## 2021-01-09 DIAGNOSIS — Z992 Dependence on renal dialysis: Secondary | ICD-10-CM | POA: Diagnosis not present

## 2021-01-09 DIAGNOSIS — N2581 Secondary hyperparathyroidism of renal origin: Secondary | ICD-10-CM | POA: Diagnosis present

## 2021-01-09 DIAGNOSIS — A403 Sepsis due to Streptococcus pneumoniae: Secondary | ICD-10-CM | POA: Diagnosis not present

## 2021-01-09 DIAGNOSIS — N179 Acute kidney failure, unspecified: Secondary | ICD-10-CM | POA: Diagnosis not present

## 2021-01-09 DIAGNOSIS — R1310 Dysphagia, unspecified: Secondary | ICD-10-CM | POA: Diagnosis present

## 2021-01-09 DIAGNOSIS — A4151 Sepsis due to Escherichia coli [E. coli]: Principal | ICD-10-CM | POA: Diagnosis present

## 2021-01-09 DIAGNOSIS — R479 Unspecified speech disturbances: Secondary | ICD-10-CM | POA: Diagnosis present

## 2021-01-09 DIAGNOSIS — Z91040 Latex allergy status: Secondary | ICD-10-CM

## 2021-01-09 DIAGNOSIS — L89152 Pressure ulcer of sacral region, stage 2: Secondary | ICD-10-CM | POA: Diagnosis present

## 2021-01-09 DIAGNOSIS — A021 Salmonella sepsis: Secondary | ICD-10-CM | POA: Diagnosis not present

## 2021-01-09 DIAGNOSIS — E875 Hyperkalemia: Secondary | ICD-10-CM | POA: Diagnosis not present

## 2021-01-09 DIAGNOSIS — I1311 Hypertensive heart and chronic kidney disease without heart failure, with stage 5 chronic kidney disease, or end stage renal disease: Secondary | ICD-10-CM | POA: Diagnosis present

## 2021-01-09 DIAGNOSIS — I69391 Dysphagia following cerebral infarction: Secondary | ICD-10-CM

## 2021-01-09 DIAGNOSIS — Z7901 Long term (current) use of anticoagulants: Secondary | ICD-10-CM

## 2021-01-09 DIAGNOSIS — G4089 Other seizures: Secondary | ICD-10-CM | POA: Diagnosis not present

## 2021-01-09 DIAGNOSIS — Z794 Long term (current) use of insulin: Secondary | ICD-10-CM

## 2021-01-09 DIAGNOSIS — E876 Hypokalemia: Secondary | ICD-10-CM | POA: Diagnosis not present

## 2021-01-09 DIAGNOSIS — R471 Dysarthria and anarthria: Secondary | ICD-10-CM | POA: Diagnosis not present

## 2021-01-09 DIAGNOSIS — N185 Chronic kidney disease, stage 5: Secondary | ICD-10-CM | POA: Diagnosis not present

## 2021-01-09 DIAGNOSIS — I69320 Aphasia following cerebral infarction: Secondary | ICD-10-CM

## 2021-01-09 DIAGNOSIS — R4189 Other symptoms and signs involving cognitive functions and awareness: Secondary | ICD-10-CM | POA: Diagnosis present

## 2021-01-09 DIAGNOSIS — N10 Acute tubulo-interstitial nephritis: Secondary | ICD-10-CM | POA: Diagnosis not present

## 2021-01-09 DIAGNOSIS — E785 Hyperlipidemia, unspecified: Secondary | ICD-10-CM | POA: Diagnosis present

## 2021-01-09 LAB — COMPREHENSIVE METABOLIC PANEL
ALT: 59 U/L — ABNORMAL HIGH (ref 0–44)
AST: 87 U/L — ABNORMAL HIGH (ref 15–41)
Albumin: 2.9 g/dL — ABNORMAL LOW (ref 3.5–5.0)
Alkaline Phosphatase: 112 U/L (ref 38–126)
Anion gap: 15 (ref 5–15)
BUN: 36 mg/dL — ABNORMAL HIGH (ref 8–23)
CO2: 20 mmol/L — ABNORMAL LOW (ref 22–32)
Calcium: 8.8 mg/dL — ABNORMAL LOW (ref 8.9–10.3)
Chloride: 105 mmol/L (ref 98–111)
Creatinine, Ser: 2.25 mg/dL — ABNORMAL HIGH (ref 0.44–1.00)
GFR, Estimated: 23 mL/min — ABNORMAL LOW (ref >60)
Glucose, Bld: 143 mg/dL — ABNORMAL HIGH (ref 70–99)
Potassium: 3.5 mmol/L (ref 3.5–5.1)
Sodium: 140 mmol/L (ref 135–145)
Total Bilirubin: 1 mg/dL (ref 0.3–1.2)
Total Protein: 6.2 g/dL — ABNORMAL LOW (ref 6.5–8.1)

## 2021-01-09 LAB — CBG MONITORING, ED
Glucose-Capillary: 126 mg/dL — ABNORMAL HIGH (ref 70–99)
Glucose-Capillary: 70 mg/dL (ref 70–99)

## 2021-01-09 LAB — DIFFERENTIAL
Abs Immature Granulocytes: 0 10*3/uL (ref 0.00–0.07)
Band Neutrophils: 10 %
Basophils Absolute: 0 10*3/uL (ref 0.0–0.1)
Basophils Relative: 0 %
Eosinophils Absolute: 0 10*3/uL (ref 0.0–0.5)
Eosinophils Relative: 0 %
Lymphocytes Relative: 4 %
Lymphs Abs: 0.8 10*3/uL (ref 0.7–4.0)
Monocytes Absolute: 1.3 10*3/uL — ABNORMAL HIGH (ref 0.1–1.0)
Monocytes Relative: 6 %
Neutro Abs: 19.1 10*3/uL — ABNORMAL HIGH (ref 1.7–7.7)
Neutrophils Relative %: 80 %
Smear Review: NORMAL

## 2021-01-09 LAB — TROPONIN I (HIGH SENSITIVITY)
Troponin I (High Sensitivity): 157 ng/L (ref <18)
Troponin I (High Sensitivity): 147 ng/L (ref <18)

## 2021-01-09 LAB — ETHANOL: Alcohol, Ethyl (B): <10 mg/dL (ref <10)

## 2021-01-09 LAB — CBC
HCT: 38.3 % (ref 36.0–46.0)
Hemoglobin: 12.1 g/dL (ref 12.0–15.0)
MCH: 28.3 pg (ref 26.0–34.0)
MCHC: 31.6 g/dL (ref 30.0–36.0)
MCV: 89.7 fL (ref 80.0–100.0)
Platelets: 197 10*3/uL (ref 150–400)
RBC: 4.27 MIL/uL (ref 3.87–5.11)
RDW: 16.1 % — ABNORMAL HIGH (ref 11.5–15.5)
WBC: 21.2 10*3/uL — ABNORMAL HIGH (ref 4.0–10.5)
nRBC: 0.1 % (ref 0.0–0.2)

## 2021-01-09 LAB — APTT: aPTT: 34 seconds (ref 24–36)

## 2021-01-09 LAB — PROCALCITONIN: Procalcitonin: 43.25 ng/mL

## 2021-01-09 LAB — LACTIC ACID, PLASMA
Lactic Acid, Venous: 8.3 mmol/L (ref 0.5–1.9)
Lactic Acid, Venous: 7.1 mmol/L (ref 0.5–1.9)

## 2021-01-09 LAB — RESP PANEL BY RT-PCR (FLU A&B, COVID) ARPGX2
Influenza A by PCR: NEGATIVE
Influenza B by PCR: NEGATIVE
SARS Coronavirus 2 by RT PCR: NEGATIVE

## 2021-01-09 LAB — PROTIME-INR
INR: 2.2 — ABNORMAL HIGH (ref 0.8–1.2)
Prothrombin Time: 24.3 seconds — ABNORMAL HIGH (ref 11.4–15.2)

## 2021-01-09 MED ORDER — ONDANSETRON HCL 4 MG/2ML IJ SOLN
4.0000 mg | Freq: Four times a day (QID) | INTRAMUSCULAR | Status: DC | PRN
  Administered 2021-01-11: 4 mg via INTRAVENOUS
  Filled 2021-01-09: qty 2

## 2021-01-09 MED ORDER — LEVETIRACETAM IN NACL 1000 MG/100ML IV SOLN
1000.0000 mg | Freq: Once | INTRAVENOUS | Status: AC
  Administered 2021-01-09: 1000 mg via INTRAVENOUS
  Filled 2021-01-09: qty 100

## 2021-01-09 MED ORDER — VANCOMYCIN HCL IN DEXTROSE 1-5 GM/200ML-% IV SOLN: 1000.0000 mg | Freq: Once | INTRAVENOUS | Status: DC

## 2021-01-09 MED ORDER — SODIUM CHLORIDE 0.9 % IV BOLUS
500.0000 mL | Freq: Once | INTRAVENOUS | Status: AC
  Administered 2021-01-09: 500 mL via INTRAVENOUS

## 2021-01-09 MED ORDER — ONDANSETRON 4 MG PO TBDP
4.0000 mg | ORAL_TABLET | Freq: Three times a day (TID) | ORAL | Status: DC | PRN
  Filled 2021-01-09: qty 1

## 2021-01-09 MED ORDER — INSULIN ASPART 100 UNIT/ML IJ SOLN
0.0000 [IU] | Freq: Three times a day (TID) | INTRAMUSCULAR | Status: DC
  Administered 2021-01-10 – 2021-01-14 (×6): 2 [IU] via SUBCUTANEOUS
  Administered 2021-01-17 (×2): 3 [IU] via SUBCUTANEOUS
  Administered 2021-01-17: 2 [IU] via SUBCUTANEOUS
  Administered 2021-01-18: 5 [IU] via SUBCUTANEOUS
  Administered 2021-01-18 – 2021-01-19 (×2): 3 [IU] via SUBCUTANEOUS
  Administered 2021-01-19 (×2): 5 [IU] via SUBCUTANEOUS
  Administered 2021-01-20: 2 [IU] via SUBCUTANEOUS
  Administered 2021-01-20: 3 [IU] via SUBCUTANEOUS
  Administered 2021-01-20 – 2021-01-21 (×2): 5 [IU] via SUBCUTANEOUS
  Administered 2021-01-21 – 2021-01-24 (×9): 3 [IU] via SUBCUTANEOUS
  Administered 2021-01-25: 09:00:00 5 [IU] via SUBCUTANEOUS
  Administered 2021-01-25 – 2021-01-26 (×2): 3 [IU] via SUBCUTANEOUS
  Administered 2021-01-26 – 2021-01-28 (×3): 2 [IU] via SUBCUTANEOUS
  Administered 2021-01-28: 17:00:00 5 [IU] via SUBCUTANEOUS
  Administered 2021-01-28 – 2021-01-29 (×2): 3 [IU] via SUBCUTANEOUS
  Administered 2021-01-29: 2 [IU] via SUBCUTANEOUS
  Filled 2021-01-09 (×35): qty 1

## 2021-01-09 MED ORDER — LACTATED RINGERS IV BOLUS (SEPSIS)
1000.0000 mL | Freq: Once | INTRAVENOUS | Status: AC
  Administered 2021-01-10: 1000 mL via INTRAVENOUS

## 2021-01-09 MED ORDER — ENOXAPARIN SODIUM 80 MG/0.8ML IJ SOSY
0.5000 mg/kg | PREFILLED_SYRINGE | INTRAMUSCULAR | Status: DC
  Administered 2021-01-09 – 2021-01-11 (×3): 67.5 mg via SUBCUTANEOUS
  Filled 2021-01-09: qty 0.68
  Filled 2021-01-09 (×2): qty 0.8
  Filled 2021-01-09: qty 0.68

## 2021-01-09 MED ORDER — LACTATED RINGERS IV BOLUS (SEPSIS)
500.0000 mL | Freq: Once | INTRAVENOUS | Status: AC
  Administered 2021-01-10: 500 mL via INTRAVENOUS

## 2021-01-09 MED ORDER — SODIUM CHLORIDE 0.9 % IV SOLN
2.0000 g | Freq: Once | INTRAVENOUS | Status: AC
  Administered 2021-01-10: 2 g via INTRAVENOUS
  Filled 2021-01-09: qty 2

## 2021-01-09 MED ORDER — AMIODARONE HCL 200 MG PO TABS
200.0000 mg | ORAL_TABLET | Freq: Two times a day (BID) | ORAL | Status: DC
  Administered 2021-01-09 – 2021-01-29 (×30): 200 mg via ORAL
  Filled 2021-01-09 (×36): qty 1

## 2021-01-09 MED ORDER — SODIUM CHLORIDE 0.9 % IV SOLN
INTRAVENOUS | Status: AC
Start: 2021-01-09 — End: 2021-01-10

## 2021-01-09 MED ORDER — VANCOMYCIN HCL 2000 MG/400ML IV SOLN
2000.0000 mg | Freq: Once | INTRAVENOUS | Status: AC
  Administered 2021-01-10: 2000 mg via INTRAVENOUS
  Filled 2021-01-09: qty 400

## 2021-01-09 MED ORDER — ROSUVASTATIN CALCIUM 10 MG PO TABS
40.0000 mg | ORAL_TABLET | Freq: Every evening | ORAL | Status: DC
  Administered 2021-01-09 – 2021-01-22 (×11): 40 mg via ORAL
  Filled 2021-01-09 (×2): qty 4
  Filled 2021-01-09: qty 2
  Filled 2021-01-09: qty 8
  Filled 2021-01-09 (×5): qty 4
  Filled 2021-01-09: qty 2
  Filled 2021-01-09 (×2): qty 4
  Filled 2021-01-09: qty 2

## 2021-01-09 MED ORDER — LORAZEPAM 2 MG/ML IJ SOLN
2.0000 mg | Freq: Once | INTRAMUSCULAR | Status: AC | PRN
  Administered 2021-01-12: 2 mg via INTRAVENOUS
  Filled 2021-01-09: qty 1

## 2021-01-09 MED ORDER — LEVETIRACETAM IN NACL 500 MG/100ML IV SOLN
500.0000 mg | Freq: Two times a day (BID) | INTRAVENOUS | Status: DC
  Administered 2021-01-10 – 2021-01-17 (×16): 500 mg via INTRAVENOUS
  Filled 2021-01-09 (×22): qty 100

## 2021-01-09 MED ORDER — DOCUSATE SODIUM 100 MG PO CAPS: 100.0000 mg | ORAL_CAPSULE | Freq: Two times a day (BID) | ORAL | Status: DC | PRN

## 2021-01-09 MED ORDER — METRONIDAZOLE 500 MG/100ML IV SOLN
500.0000 mg | Freq: Two times a day (BID) | INTRAVENOUS | Status: DC
  Administered 2021-01-10 (×2): 500 mg via INTRAVENOUS
  Filled 2021-01-09 (×2): qty 100

## 2021-01-09 MED ORDER — POLYETHYLENE GLYCOL 3350 17 G PO PACK: 17.0000 g | PACK | Freq: Two times a day (BID) | ORAL | Status: DC | PRN

## 2021-01-09 NOTE — Progress Notes (Signed)
CODE SEPSIS - PHARMACY COMMUNICATION  **Broad Spectrum Antibiotics should be administered within 1 hour of Sepsis diagnosis**  Time Code Sepsis Called/Page Received: 2238  Antibiotics Ordered: Cefepime, Flagyl, Vancomycin  Time of 1st antibiotic administration: 0054  Additional action taken by pharmacy: Sent [place] to [place], RN.  RN currently 1:1 with another pt.  Agricultural consultant notified.  0028 Contacted [place], Agricultural consultant noting abx still not started for this pt.  Renda Rolls, PharmD, [facility] 01/10/2021 1:15 AM

## 2021-01-09 NOTE — ED Provider Notes (Signed)
[facility] Emergency Department Provider Note   ____________________________________________    I have reviewed the triage vital signs and the nursing notes.   HISTORY  Chief Complaint Code Stroke     HPI Sandra Massey is a 70 y.o. female with history of CKD, atrial fibrillation, diabetes, CVA with right-sided deficits who presents with reported altered mental status and aphasia.  Patient apparently was at her baseline at 1030 this morning, on recheck they found that the patient was having difficulty speaking, EMS was called and patient referred to the emergency department.  Patient is unable to give significant history.  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    Hypertension    Substance abuse ([place])     Patient Active Problem List   Diagnosis Date Noted   AKI (acute kidney injury) ([place]) 06/24/2020   Acute kidney injury superimposed on chronic kidney disease ([place]) 06/23/2020   Hypoglycemia associated with diabetes ([place]) 06/23/2020   Hypokalemia 06/23/2020   Chronic atrial fibrillation (HCC)    CKD (chronic kidney disease), stage III ([place])    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 Authorizing Provider  amiodarone (PACERONE) 200 MG tablet Take 1 tablet (200 mg total) by mouth 2 (two) times daily. 06/06/19   Loletha Grayer, MD  amLODipine (NORVASC) 10 MG tablet Take 1 tablet (10 mg total) by mouth daily. 06/26/20 06/26/21  Nita Sells, MD  ascorbic acid (VITAMIN C) 500 MG tablet Take 500 mg by mouth daily.    [provider]  ELIQUIS 5 MG TABS tablet Take 5 mg by mouth 2 (two) times daily. 03/11/19   [provider]  insulin aspart (NOVOLOG) 100 UNIT/ML FlexPen Inject 20 Units into the skin 3 (three) times daily with meals. 04/12/20   Enzo Bi, MD  insulin glargine (LANTUS) 100 UNIT/ML Solostar Pen Inject 32 Units into the skin daily. 06/26/20   Nita Sells, MD  Insulin Pen Needle 31G X 8 MM MISC 1 needle with each injection of insulin 06/07/19   Loletha Grayer, MD  Multiple Vitamin (MULTIVITAMIN WITH MINERALS) TABS tablet Take 1 tablet by mouth daily.    [provider]  rosuvastatin (CRESTOR) 40 MG tablet Take 40 mg by mouth every evening.    [provider]  sertraline (ZOLOFT) 50 MG tablet Take 1 tablet (50 mg total) by mouth daily. 06/26/20   Nita Sells, MD     Allergies Latex  No family history on file.  Social History Social History   Tobacco Use   Smoking status: Never   Smokeless tobacco: Never  Tobacco comments:    2007 quit   Substance Use Topics   Alcohol use: Never   Drug use: Never    Review of Systems limited by altered mental status     ____________________________________________   PHYSICAL EXAM:  VITAL SIGNS: ED Triage Vitals  Enc Vitals Group     BP 01/09/21 1349 122/74     Pulse Rate 01/09/21 1349 77     Resp 01/09/21 1349 (!) 22     Temp 01/09/21 1351 98.3 F (36.8 C)     Temp src --      SpO2 01/09/21 1349 90 %     Weight --      Height --      Head Circumference --      Peak Flow --       Pain Score --      Pain Loc --      Pain Edu? --      Excl. in [place]? --     Constitutional: Alert, appears disoriented Eyes: Conjunctivae are normal.   Nose: No congestion/rhinnorhea. Mouth/Throat: Mucous membranes are moist.    Cardiovascular: Normal rate, regular rhythm. Grossly normal heart sounds.  Good peripheral circulation. Respiratory: Normal respiratory effort.  No retractions. Lungs CTAB. Gastrointestinal: Soft and nontender. No distention.  No CVA tenderness. Genitourinary: deferred Musculoskeletal: No lower extremity tenderness nor edema.  Warm and well perfused Neurologic: Right-sided weakness, apparently chronic.  Patient's speech is difficult to understand, however cranial nerves seem intact Skin:  Skin is warm, dry and intact. No rash noted. Psychiatric: Mood and affect are normal. Speech and behavior are normal.  ____________________________________________   LABS (all labs ordered are listed, but only abnormal results are displayed)  Labs Reviewed  PROTIME-INR - Abnormal; Notable for the following components:      Result Value   Prothrombin Time 24.3 (*)    INR 2.2 (*)    All other components within normal limits  CBC - Abnormal; Notable for the following components:   WBC 21.2 (*)    RDW 16.1 (*)    All other components within normal limits  COMPREHENSIVE METABOLIC PANEL - Abnormal; Notable for the following components:   CO2 20 (*)    Glucose, Bld 143 (*)    BUN 36 (*)    Creatinine, Ser 2.25 (*)    Calcium 8.8 (*)    Total Protein 6.2 (*)    Albumin 2.9 (*)    AST 87 (*)    ALT 59 (*)    GFR, Estimated 23 (*)    All other components within normal limits  CBG MONITORING, ED - Abnormal; Notable for the following components:   Glucose-Capillary 126 (*)    All other components within normal limits  RESP PANEL BY RT-PCR (FLU A&B, COVID) ARPGX2  ETHANOL  APTT  DIFFERENTIAL  URINE DRUG SCREEN, QUALITATIVE ([facility] ONLY)  URINALYSIS, ROUTINE W REFLEX  MICROSCOPIC  TROPONIN I (HIGH SENSITIVITY)   ____________________________________________  EKG  ED ECG REPORT I, Lavonia Drafts, the attending physician, personally viewed and interpreted this ECG.  Date: 01/09/2021  Rhythm: Atrial fibrillation QRS Axis: normal Intervals: normal ST/T Wave abnormalities: Nonspecific changes   ____________________________________________  RADIOLOGY  Chest x-ray reviewed by me, no acute abnormality ____________________________________________   PROCEDURES  Procedure(s) performed: No  Procedures   Critical Care performed: yes  CRITICAL CARE Performed by: Lavonia Drafts   Total critical care time: 30 minutes  Critical care time was exclusive of separately billable procedures and  treating other patients.  Critical care was necessary to treat or prevent imminent or life-threatening deterioration.  Critical care was time spent personally by me on the following activities: development of treatment plan with patient and/or surrogate as well as nursing, discussions with consultants, evaluation of patient's response to treatment, examination of patient, obtaining history from patient or surrogate, ordering and performing treatments and interventions, ordering and review of laboratory studies, ordering and review of radiographic studies, pulse oximetry and re-evaluation of patient's condition.  ____________________________________________   INITIAL IMPRESSION / ASSESSMENT AND PLAN / ED COURSE  Pertinent labs & imaging results that were available during my care of the patient were reviewed by me and considered in my medical decision making (see chart for details).   Patient presents with reported possible acute deficit of confusion and aphasia.  History of CVA in the past.  Code stroke activated.  Patient also has mild tachypnea with oxygen saturations in the low 90s, question possible infectious etiology of confusion.  Will obtain chest x-ray,  labs pending  Patient seen by neurology, he feels that she has likely had CVA, possibly subacute however she is not a candidate for tPA nor intervention.  Recommends MRI,   Chest x-ray without significant abnormality, given her right-sided chest pain will send for CT angiography      ____________________________________________   FINAL CLINICAL IMPRESSION(S) / ED DIAGNOSES  Final diagnoses:  Cerebrovascular accident (CVA), unspecified mechanism ([place])        Note:  This document was prepared using Dragon voice recognition software and may include unintentional dictation errors.    Lavonia Drafts, MD 01/09/21 [phone]

## 2021-01-09 NOTE — Procedures (Addendum)
Patient Name: Sandra Massey  MRN: [id number]  Epilepsy Attending: Lora Havens  Referring Physician/Provider: Dr Kerney Elbe Date: 01/09/2021 Duration: 20.56 mins  Patient history: 70 year old female SNF resident with a history of atrial fibrillation, on Eliquis, who presents with acute onset of altered speech.  EEG to evaluate for seizure.  Level of alertness: Awake, asleep  AEDs during EEG study: None  Technical aspects: This EEG study was done with scalp electrodes positioned according to the 10-20 International system of electrode placement. Electrical activity was acquired at a sampling rate of 500Hz  and reviewed with a high frequency filter of 70Hz  and a low frequency filter of 1Hz . EEG data were recorded continuously and digitally stored.   Description: The posterior dominant rhythm consists of 8 Hz activity of moderate voltage (25-35 uV) seen predominantly in posterior head regions, asymmetric ( left<right) and reactive to eye opening and eye closing. Sleep was characterized by vertex waves, sleep spindles (12 to 14 Hz), maximal frontocentral region.  EEG showed continuous 3 to 6 Hz theta-delta slowing in left hemisphere, maximal left posterior quadrant. Spikes were also noted in left posterior temporal region. Physiologic photic driving was not seen during photic stimulation. Hyperventilation was not performed.     ABNORMALITY  - Spike,left posterior temporal region - Continuous slow, left hemisphere, maximal left posterior quadrant - Background asymmetry, left<right  IMPRESSION: This study showed evidence of epileptogenicity arising from left posterior temporal region.  There is also cortical dysfunction arising from left hemisphere, maximal left posterior quadrant,  likely secondary to underlying structural abnormality, post-ictal state. No seizures were seen throughout the recording.  Dr Cheral Marker was notified.   [name] [name]

## 2021-01-09 NOTE — Progress Notes (Signed)
  Chaplain On-Call responded to Code Stroke notification at 1350 hours.  Chaplain learned from the Staff that the patient was receiving CT scan. No family is present.  Chaplain On-Call is available as needed for further support.  Chaplain Pollyann Samples M.Div., [facility]

## 2021-01-09 NOTE — ED Triage Notes (Signed)
Pt comes into the ED via EMS from [facility] with c/o from staff the pt was normal at 10;30 and then at 11am she had a change in speech, pt has a hx of stroke with right sided weakness

## 2021-01-09 NOTE — Progress Notes (Signed)
Eeg done 

## 2021-01-09 NOTE — Sepsis Progress Note (Signed)
Monitoring for the code sepsis protocol. °

## 2021-01-09 NOTE — H&P (Addendum)
History and Physical    Sandra Massey ERD:[id number] DOB: 27-Apr-1950 DOA: 01/09/2021  PCP: [name], MD  Patient coming from: SNF  I have personally briefly reviewed patient's old medical records in [place]  Chief Complaint: change in speech  HPI: Sandra Massey is a 70 y.o. female [place] with medical history significant of old left MCA territory infarct, with right-sided hemiparesis, chronic A. fib, insulin-dependent diabetes mellitus, hypertension who was sent from SNF for a change in speech.  Not able to obtain hx from pt due her speech difficulty and apparent cognitive deficits.  Called all 3 listed contacts with no answers.  Pt kept crying out "on my god" and wailed.  Per H&P from 04/06/20, pt has chronic difficulty speaking, so unclear whether current difficulty speaking is worse than her baseline.     ED Course: initial vitals: afebrile, pulse 77, BP 122/74, sating 90% on room air.  BP later decreased to 80's in the ED, and pt received 500 ml bolus.  Labs notable for WBC 21.2, Cr 2.25, INR 2.2, lactic acid 8.3.  trop 157 then 147.  EKG no ACS-related changes.  CT head no acute finding.  CXR no acute finding.  Neuro consulted by ED provider for stroke workup.   Assessment/Plan Active Problems:   Speech abnormality  # Expressive aphasia # Hx of left MCA territory infarct, with right-sided hemiparesis --Facility reported pt had a change in speech, although during previous hospitalization had noted baseline difficulty speaking. --Neuro consulted and initiated stroke workup. Plan: --MR brain --EEG, per neuro  # Leukocytosis # Lactic acidosis # Hypotension --WBC 21.2.  procal obtained on admission, elevated at 43.25.  No fever, no source of infection so far. Plan: --obtain UA and blood cx --another 500 ml bolus f/b MIVF@50  ml/hr --trend   # Likely AKI --Cr 2.25 on presentation.  Last Cr 1.58 in April 2022. --s/p 500 ml in the ED Plan: --gentle IVF  hydration  # Mild Trop elevation --trop 157 then 147.  Pt has mildly elevated trop at baseline, ~100's.  # Chronic Afib on Eliquis --cont home amiodarone --Hold Eliquis for now until MRI rules out large stroke  # HTN --currently hypotensive --hold home amlodipine  # DM2 --ACHS BG checks and SSI TID for now  # Elevated INR # Mildly elevated LFT's --unclear etiology.  No hx of cirrhosis --monitor for now  # Cognitive deficits --query dementia.     DVT prophylaxis: Lovenox SQ Code Status: DNR  Family Communication: tried calling all 3 listed contacts, no answer  Disposition Plan: SNF  Consults called: neuro Level of care: Med-Surg   Review of Systems: As per HPI otherwise complete review of systems negative.   Past Medical History:  Diagnosis Date   Arthritis    Chronic atrial fibrillation (HCC)    CKD (chronic kidney disease), stage III (HCC)    Diabetes mellitus without complication ([place])    History of ischemic cerebrovascular accident (CVA) with residual deficit    Hypertension    Substance abuse ([place])     Past Surgical History:  Procedure Laterality Date   BREAST BIOPSY Right    sebaceous cyst removed     reports that she has never smoked. She has never used smokeless tobacco. She reports that she does not drink alcohol and does not use drugs.  Allergies  Allergen Reactions   Latex Rash    No family history on file.   Prior to Admission medications  Medication Sig Start Date End Date Taking? Authorizing Provider  amiodarone (PACERONE) 200 MG tablet Take 1 tablet (200 mg total) by mouth 2 (two) times daily. 06/06/19  Yes Wieting, Richard, MD  amLODipine (NORVASC) 10 MG tablet Take 1 tablet (10 mg total) by mouth daily. 06/26/20 06/26/21 Yes Nita Sells, MD  cetirizine (ZYRTEC) 5 MG tablet Take 5 mg by mouth daily.   Yes [provider]  ELIQUIS 5 MG TABS tablet Take 5 mg by mouth 2 (two) times daily. 03/11/19  Yes [provider]  LEVEMIR FLEXTOUCH 100 UNIT/ML FlexTouch Pen Inject 20 Units into the skin daily at 12 noon. 12/12/20  Yes [provider]  metFORMIN (GLUCOPHAGE) 1000 MG tablet Take 1,000 mg by mouth 2 (two) times daily. 12/20/20  Yes [provider]  Multiple Vitamin (MULTIVITAMIN WITH MINERALS) TABS tablet Take 1 tablet by mouth daily.   Yes [provider]  NOVOLIN R 100 UNIT/ML injection Inject 2-10 Units into the skin 4 (four) times daily -  before meals and at bedtime. 01/03/21  Yes [provider]  rosuvastatin (CRESTOR) 40 MG tablet Take 40 mg by mouth every evening.   Yes [provider]  ascorbic acid (VITAMIN C) 500 MG tablet Take 500 mg by mouth daily. Patient not taking: Reported on 01/09/2021    [provider]  insulin aspart (NOVOLOG) 100 UNIT/ML FlexPen Inject 20 Units into the skin 3 (three) times daily with meals. Patient not taking: Reported on 01/09/2021 04/12/20   Enzo Bi, MD  insulin glargine (LANTUS) 100 UNIT/ML Solostar Pen Inject 32 Units into the skin daily. Patient not taking: No sig reported 06/26/20   Nita Sells, MD  Insulin Pen Needle 31G X 8 MM MISC 1 needle with each injection of insulin 06/07/19   Loletha Grayer, MD  sertraline (ZOLOFT) 50 MG tablet Take 1 tablet (50 mg total) by mouth daily. Patient not taking: No sig reported 06/26/20   Nita Sells, MD    Physical Exam: Vitals:   01/09/21 1430 01/09/21 1500 01/09/21 1530 01/09/21 1600  BP: 99/66 94/78 (!) 82/42 (!) 84/59  Pulse: 98 72 74 82  Resp: (!) 33 (!) 27 (!) 30 (!) 26  Temp:      SpO2: 95% 100% 96% 100%  Weight:      Height:        Constitutional: NAD, alert, oriented to person and hospital, difficulty finding words, crying, wailing almost continuously. HEENT: conjunctivae and lids normal, EOMI CV: No cyanosis.   RESP: normal respiratory effort Extremities: No effusions, edema in BLE SKIN: warm, dry Neuro: right arm and  right leg weakness. Psych: labile mood and affect.     Labs on Admission: I have personally reviewed following labs and imaging studies  CBC: Recent Labs  Lab 01/09/21 1417  WBC 21.2*  NEUTROABS 19.1*  HGB 12.1  HCT 38.3  MCV 89.7  PLT 196   Basic Metabolic Panel: Recent Labs  Lab 01/09/21 1417  NA 140  K 3.5  CL 105  CO2 20*  GLUCOSE 143*  BUN 36*  CREATININE 2.25*  CALCIUM 8.8*   GFR: Estimated Creatinine Clearance: 34.5 mL/min (A) (by C-G formula based on SCr of 2.25 mg/dL (H)). Liver Function Tests: Recent Labs  Lab 01/09/21 1417  AST 87*  ALT 59*  ALKPHOS 112  BILITOT 1.0  PROT 6.2*  ALBUMIN 2.9*   No results for input(s): LIPASE, AMYLASE in the last 168 hours. No results for input(s): AMMONIA in  the last 168 hours. Coagulation Profile: Recent Labs  Lab 01/09/21 1417  INR 2.2*   Cardiac Enzymes: No results for input(s): CKTOTAL, CKMB, CKMBINDEX, TROPONINI in the last 168 hours. BNP (last 3 results) No results for input(s): PROBNP in the last 8760 hours. HbA1C: No results for input(s): HGBA1C in the last 72 hours. CBG: Recent Labs  Lab 01/09/21 1411  GLUCAP 126*   Lipid Profile: No results for input(s): CHOL, HDL, LDLCALC, TRIG, CHOLHDL, LDLDIRECT in the last 72 hours. Thyroid Function Tests: No results for input(s): TSH, T4TOTAL, FREET4, T3FREE, THYROIDAB in the last 72 hours. Anemia Panel: No results for input(s): VITAMINB12, FOLATE, FERRITIN, TIBC, IRON, RETICCTPCT in the last 72 hours. Urine analysis:    Component Value Date/Time   COLORURINE YELLOW (A) 06/23/2020 2255   APPEARANCEUR HAZY (A) 06/23/2020 2255   LABSPEC 1.011 06/23/2020 2255   PHURINE 5.0 06/23/2020 2255   GLUCOSEU NEGATIVE 06/23/2020 2255   HGBUR SMALL (A) 06/23/2020 2255   BILIRUBINUR NEGATIVE 06/23/2020 2255   KETONESUR NEGATIVE 06/23/2020 2255   PROTEINUR NEGATIVE 06/23/2020 2255   NITRITE NEGATIVE 06/23/2020 2255   LEUKOCYTESUR MODERATE (A) 06/23/2020 2255     Radiological Exams on Admission: DG Chest Port 1 View  Result Date: 01/09/2021 CLINICAL DATA:  Shortness of breath.  Slurred speech. EXAM: PORTABLE CHEST 1 VIEW COMPARISON:  04/06/2020 FINDINGS: Stable mild cardiomegaly. Aortic atherosclerotic calcification noted. Both lungs are clear. IMPRESSION: Stable mild cardiomegaly. No active lung disease. Electronically Signed   By: Marlaine Hind M.D.   On: 01/09/2021 14:51   CT HEAD CODE STROKE WO CONTRAST  Result Date: 01/09/2021 CLINICAL DATA:  Code stroke. EXAM: CT HEAD WITHOUT CONTRAST TECHNIQUE: Contiguous axial images were obtained from the base of the skull through the vertex without intravenous contrast. COMPARISON:  04/06/2020 FINDINGS: Brain: There is no acute intracranial hemorrhage, mass effect, or edema. Chronic infarct left MCA territory with associated volume loss. No new loss of gray-white differentiation. Additional areas low-density in the supratentorial white matter are nonspecific but probably reflect stable chronic microvascular ischemic changes. Vascular: No hyperdense vessel. There is intracranial atherosclerotic calcification at the skull base. Skull: Unremarkable. Sinuses/Orbits: Aerated.  Orbits are unremarkable. Other: Mastoid air cells are clear. ASPECTS ([place] Stroke Program Early CT Score) - Ganglionic level infarction (caudate, lentiform nuclei, internal capsule, insula, M1-M3 cortex): 7 - Supraganglionic infarction (M4-M6 cortex): 3 Total score (0-10 with 10 being normal): 10 IMPRESSION: There is no acute intracranial hemorrhage or evidence of acute infarction. ASPECT score is 10. 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 These results were communicated to Dr. Cheral Marker at 2:08 pm on 01/09/2021 by text page via the [facility] messaging system. Electronically Signed   By: Macy Mis M.D.   On: 01/09/2021 14:14      Enzo Bi MD [facility]  If 7PM-7AM, please contact  night-coverage 01/09/2021, 5:32 PM

## 2021-01-09 NOTE — Consult Note (Signed)
PHARMACIST - PHYSICIAN COMMUNICATION  CONCERNING:  Enoxaparin (Lovenox) for DVT Prophylaxis    RECOMMENDATION: Patient was prescribed enoxaprin 40mg  q24 hours for VTE prophylaxis.   Filed Weights   01/09/21 1352  Weight: 136.1 kg (300 lb)    Body mass index is 46.99 kg/m.  Estimated Creatinine Clearance: 34.5 mL/min (A) (by C-G formula based on SCr of 2.25 mg/dL (H)).   Based on [place] patient is candidate for enoxaparin 0.5mg /kg TBW SQ every 24 hours based on BMI being >30.  DESCRIPTION: Pharmacy has adjusted enoxaparin dose per [facility] policy.  Patient is now receiving enoxaparin 67.5 mg every 24 hours    Narda Rutherford, PharmD Pharmacy Resident  01/09/2021 5:05 PM

## 2021-01-09 NOTE — ED Notes (Signed)
Staff at [employer] called and provided update on patient

## 2021-01-09 NOTE — Sepsis Progress Note (Addendum)
Notified bedside nurse of need to draw lactic acid and blood cultures.  RN with another patient, states charge RN to ensure labs are drawn.

## 2021-01-09 NOTE — ED Notes (Signed)
Code  stroke  called  to  [facility] 

## 2021-01-09 NOTE — Consult Note (Signed)
NEURO HOSPITALIST CONSULT NOTE   Requestig physician: Dr. Corky Downs  Reason for Consult: Acute onset of speech changes  History obtained from:  EMS and Chart   HPI:                                                                                                                                          Sandra Massey is an 70 y.o. 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 Per staff there, LKN was 10:30 AM. At 11am she had a change in speech. Code Stroke was called.   Past Medical History:  Diagnosis Date   Arthritis    Chronic atrial fibrillation (HCC)    CKD (chronic kidney disease), stage III (HCC)    Diabetes mellitus without complication ([place])    History of ischemic cerebrovascular accident (CVA) with residual deficit    Hypertension    Substance abuse ([place])     Past Surgical History:  Procedure Laterality Date   BREAST BIOPSY Right    sebaceous cyst removed    No family history on file.          Social History:  reports that she has never smoked. She has never used smokeless tobacco. She reports that she does not drink alcohol and does not use drugs.  Allergies  Allergen Reactions   Latex Rash    MEDICATIONS:                                                                                                                      No current facility-administered medications on file prior to encounter.   Current Outpatient Medications on File Prior to Encounter  Medication Sig Dispense Refill   amiodarone (PACERONE) 200 MG tablet Take 1 tablet (200 mg total) by mouth 2 (two) times daily. 60 tablet 0   amLODipine (NORVASC) 10 MG tablet Take 1 tablet (10 mg total) by mouth daily. 30 tablet 11   cetirizine (ZYRTEC) 5 MG tablet Take 5 mg by mouth daily.     ELIQUIS 5 MG TABS tablet Take  5 mg by mouth 2 (two) times daily.  LEVEMIR FLEXTOUCH 100 UNIT/ML FlexTouch Pen Inject 20 Units into the skin daily at 12 noon.     metFORMIN (GLUCOPHAGE) 1000 MG tablet Take 1,000 mg by mouth 2 (two) times daily.     Multiple Vitamin (MULTIVITAMIN WITH MINERALS) TABS tablet Take 1 tablet by mouth daily.     NOVOLIN R 100 UNIT/ML injection Inject 2-10 Units into the skin 4 (four) times daily -  before meals and at bedtime.     rosuvastatin (CRESTOR) 40 MG tablet Take 40 mg by mouth every evening.     ascorbic acid (VITAMIN C) 500 MG tablet Take 500 mg by mouth daily. (Patient not taking: Reported on 01/09/2021)     insulin aspart (NOVOLOG) 100 UNIT/ML FlexPen Inject 20 Units into the skin 3 (three) times daily with meals. (Patient not taking: Reported on 01/09/2021)     insulin glargine (LANTUS) 100 UNIT/ML Solostar Pen Inject 32 Units into the skin daily. (Patient not taking: No sig reported) 10 mL    Insulin Pen Needle 31G X 8 MM MISC 1 needle with each injection of insulin 100 each 0   sertraline (ZOLOFT) 50 MG tablet Take 1 tablet (50 mg total) by mouth daily. (Patient not taking: No sig reported) 5 tablet 0     ROS:                                                                                                                                       Unable to obtain due to agitation.    Blood pressure 122/74, pulse 77, temperature 98.3 F (36.8 C), resp. rate (!) 22, height 5\' 7"  (1.702 m), weight 136.1 kg, SpO2 90 %.   General Examination:                                                                                                       Physical Exam  HEENT-  [place]/AT  Lungs- In the context of wailing and yelling in pain, there is no definite tachypnea Extremities- No cyanosis or pallor  Neurological Examination Mental Status: Awake and agitated. Decreased level of attention to external stimuli. Yelling and wailing with almost any stimulation. Follows some simple commands. Not  cooperative with focused speech assessment, but will exclaim in pain with short phrases that are grammatically intact. Other vocalizations are difficult to understand. No definite dysarthria. Possible pseudobulbar affect.  Cranial Nerves: II: Does not cooperate with testing.   III,IV, VI: Eyes grossly conjugate without forced deviation.  V,VII:  Unable to assess in detail due to agitation. No gross asymmetry noted.  VIII: Unable to assess but seems to alert to voice IX,X: No hoarseness in the context of yelling/exclaiming XI: Unable to formally assess XII: Unable to assess Motor: Right upper and lower extremity weakness is worse than on the left. Assessment based on patient's spontaneous movements and her reactions to passive movement of her extremities by examiner, as she is uncooperative with exam.  Sensory: Unable to formally assess.  Deep Tendon Reflexes: Uncooperative Cerebellar: Unable to assess Gait: Unable to assess   Lab Results: Basic Metabolic Panel: No results for input(s): NA, K, CL, CO2, GLUCOSE, BUN, CREATININE, CALCIUM, MG, PHOS in the last 168 hours.  CBC: No results for input(s): WBC, NEUTROABS, HGB, HCT, MCV, PLT in the last 168 hours.  Cardiac Enzymes: No results for input(s): CKTOTAL, CKMB, CKMBINDEX, TROPONINI in the last 168 hours.  Lipid Panel: No results for input(s): CHOL, TRIG, HDL, CHOLHDL, VLDL, LDLCALC in the last 168 hours.  Imaging: No results found.   Assessment: 70 year old female SNF resident with a history of atrial fibrillation, on Eliquis, who presents with acute onset of altered speech. LKN 1030.  1. Exam reveals agitation and pained affect with frequent exclamations and findings most consistent with her chronic right sided weakness. Unable to further delineate deficits due to agitation.  2. CT head reveals no acute intracranial hemorrhage or evidence of acute infarction. ASPECT score is 10. Noted is a large region of chronically infarcted  tissue in the left MCA territory as well as chronic microvascular ischemic changes, as seen previously. There is subtle hypodensity of the left occipital lobe that may be artifactual, but also could represent subacute versus chronic infarction; if this is a subacute infarction, it does not appear to be salvageable with thrombectomy.  3. DDx for presentation includes subacute stroke versus new onset subclinical or unwitnessed seizure with postictal state, versus severe pain precipitating a delirium, versus toxic/metabolic encephalopathy.  4. Of note, the old MCA infarction appears large enough to most likely be associated with a chronic dysphasia.  5. Severe right thoracic chest pain which increases with palpation on exam. Discussed with EDP, who will be further assessing this finding.   Recommendations: 1. MRI brain (ordered) 2. EEG (ordered) 3. Frequent neuro checks 4. Work up for severe right lateral chest pain per EDP.   Addendum: - EEG reveals  the following abnormalities:  - Spike,left posterior temporal region - Continuous slow, left hemisphere, maximal left posterior quadrant - Background asymmetry, left<right Impression: This study showed evidence of epileptogenicity arising from left posterior temporal region.  There is also cortical dysfunction arising from left hemisphere, maximal left posterior quadrant,  likely secondary to underlying structural abnormality, post-ictal state. No seizures were seen throughout the recording. - Has poor renal function. LFTs also mildly elevated. Starting renally dosed Keppra with 1000 mg IV load followed by 500 mg IV BID (ordered).  - Will obtain repeat EEG tomorrow (ordered)   Electronically signed: Dr. Kerney Elbe 01/09/2021, 2:01 PM

## 2021-01-09 NOTE — ED Notes (Signed)
Spoke with staff at [facility] they gave meds at 1030 and when they walked by around 11 the pt was slumped , she was lethargic , increased weakness  the right arm  where she is normally able to lift her arm is now not able to, states her speech is slurred, VS, 97/51, HR 44, 91%RA, RR20, 98 temp. Reports the pt had an episode of emesis today.

## 2021-01-09 NOTE — Progress Notes (Signed)
PHARMACY -  BRIEF ANTIBIOTIC NOTE   Pharmacy has received consult(s) for Cefepime and Vancomycin from an ED provider.  The patient's profile has been reviewed for ht/wt/allergies/indication/available labs.    One time order(s) placed for Cefepime 2 gm and Vancomycin 2 gm per pt wt 136.1 kg.  Further antibiotics/pharmacy consults should be ordered by admitting physician if indicated.                       Thank you, Renda Rolls, PharmD, [facility] 01/09/2021 10:43 PM

## 2021-01-10 ENCOUNTER — Inpatient Hospital Stay: Payer: Medicare HMO

## 2021-01-10 DIAGNOSIS — R4182 Altered mental status, unspecified: Secondary | ICD-10-CM | POA: Diagnosis not present

## 2021-01-10 DIAGNOSIS — A419 Sepsis, unspecified organism: Secondary | ICD-10-CM | POA: Diagnosis not present

## 2021-01-10 DIAGNOSIS — G4089 Other seizures: Secondary | ICD-10-CM

## 2021-01-10 DIAGNOSIS — I639 Cerebral infarction, unspecified: Secondary | ICD-10-CM | POA: Diagnosis not present

## 2021-01-10 LAB — LACTIC ACID, PLASMA
Lactic Acid, Venous: 4.1 mmol/L (ref 0.5–1.9)
Lactic Acid, Venous: 4.4 mmol/L (ref 0.5–1.9)

## 2021-01-10 LAB — CBC WITH DIFFERENTIAL/PLATELET
Abs Immature Granulocytes: 1.89 10*3/uL — ABNORMAL HIGH (ref 0.00–0.07)
Basophils Absolute: 0.1 10*3/uL (ref 0.0–0.1)
Basophils Relative: 0 %
Eosinophils Absolute: 0 10*3/uL (ref 0.0–0.5)
Eosinophils Relative: 0 %
HCT: 34.6 % — ABNORMAL LOW (ref 36.0–46.0)
Hemoglobin: 11 g/dL — ABNORMAL LOW (ref 12.0–15.0)
Immature Granulocytes: 6 %
Lymphocytes Relative: 4 %
Lymphs Abs: 1.3 10*3/uL (ref 0.7–4.0)
MCH: 28.4 pg (ref 26.0–34.0)
MCHC: 31.8 g/dL (ref 30.0–36.0)
MCV: 89.4 fL (ref 80.0–100.0)
Monocytes Absolute: 1.2 10*3/uL — ABNORMAL HIGH (ref 0.1–1.0)
Monocytes Relative: 4 %
Neutro Abs: 28.9 10*3/uL — ABNORMAL HIGH (ref 1.7–7.7)
Neutrophils Relative %: 86 %
Platelets: 179 10*3/uL (ref 150–400)
RBC: 3.87 MIL/uL (ref 3.87–5.11)
RDW: 16.6 % — ABNORMAL HIGH (ref 11.5–15.5)
Smear Review: NORMAL
WBC: 33.3 10*3/uL — ABNORMAL HIGH (ref 4.0–10.5)
nRBC: 0.1 % (ref 0.0–0.2)

## 2021-01-10 LAB — BLOOD CULTURE ID PANEL (REFLEXED) - BCID2

## 2021-01-10 LAB — COMPREHENSIVE METABOLIC PANEL
ALT: 189 U/L — ABNORMAL HIGH (ref 0–44)
AST: 181 U/L — ABNORMAL HIGH (ref 15–41)
Albumin: 2.7 g/dL — ABNORMAL LOW (ref 3.5–5.0)
Alkaline Phosphatase: 62 U/L (ref 38–126)
Anion gap: 12 (ref 5–15)
BUN: 42 mg/dL — ABNORMAL HIGH (ref 8–23)
CO2: 22 mmol/L (ref 22–32)
Calcium: 8.4 mg/dL — ABNORMAL LOW (ref 8.9–10.3)
Chloride: 104 mmol/L (ref 98–111)
Creatinine, Ser: 2.6 mg/dL — ABNORMAL HIGH (ref 0.44–1.00)
GFR, Estimated: 19 mL/min — ABNORMAL LOW (ref >60)
Glucose, Bld: 80 mg/dL (ref 70–99)
Potassium: 4.2 mmol/L (ref 3.5–5.1)
Sodium: 138 mmol/L (ref 135–145)
Total Bilirubin: 1 mg/dL (ref 0.3–1.2)
Total Protein: 5.8 g/dL — ABNORMAL LOW (ref 6.5–8.1)

## 2021-01-10 LAB — URINE DRUG SCREEN, QUALITATIVE (ARMC ONLY)
Amphetamines, Ur Screen: NOT DETECTED
Barbiturates, Ur Screen: NOT DETECTED
Benzodiazepine, Ur Scrn: NOT DETECTED
Cannabinoid 50 Ng, Ur ~~LOC~~: NOT DETECTED
Cocaine Metabolite,Ur ~~LOC~~: NOT DETECTED
MDMA (Ecstasy)Ur Screen: NOT DETECTED
Methadone Scn, Ur: NOT DETECTED
Opiate, Ur Screen: NOT DETECTED
Phencyclidine (PCP) Ur S: NOT DETECTED
Tricyclic, Ur Screen: NOT DETECTED

## 2021-01-10 LAB — CBC
HCT: 32.9 % — ABNORMAL LOW (ref 36.0–46.0)
Hemoglobin: 10.6 g/dL — ABNORMAL LOW (ref 12.0–15.0)
MCH: 29.4 pg (ref 26.0–34.0)
MCHC: 32.2 g/dL (ref 30.0–36.0)
MCV: 91.1 fL (ref 80.0–100.0)
Platelets: 156 10*3/uL (ref 150–400)
RBC: 3.61 MIL/uL — ABNORMAL LOW (ref 3.87–5.11)
RDW: 16.7 % — ABNORMAL HIGH (ref 11.5–15.5)
WBC: 27.7 10*3/uL — ABNORMAL HIGH (ref 4.0–10.5)
nRBC: 0 % (ref 0.0–0.2)

## 2021-01-10 LAB — URINALYSIS, ROUTINE W REFLEX MICROSCOPIC
RBC / HPF: >50 RBC/hpf — ABNORMAL HIGH (ref 0–5)
Specific Gravity, Urine: 1.017 (ref 1.005–1.030)
Squamous Epithelial / HPF: NONE SEEN (ref 0–5)
WBC, UA: >50 WBC/hpf — ABNORMAL HIGH (ref 0–5)

## 2021-01-10 LAB — BASIC METABOLIC PANEL
Anion gap: 12 (ref 5–15)
BUN: 42 mg/dL — ABNORMAL HIGH (ref 8–23)
CO2: 20 mmol/L — ABNORMAL LOW (ref 22–32)
Calcium: 8.2 mg/dL — ABNORMAL LOW (ref 8.9–10.3)
Chloride: 106 mmol/L (ref 98–111)
Creatinine, Ser: 2.78 mg/dL — ABNORMAL HIGH (ref 0.44–1.00)
GFR, Estimated: 18 mL/min — ABNORMAL LOW (ref >60)
Glucose, Bld: 68 mg/dL — ABNORMAL LOW (ref 70–99)
Potassium: 4.5 mmol/L (ref 3.5–5.1)
Sodium: 138 mmol/L (ref 135–145)

## 2021-01-10 LAB — CBG MONITORING, ED
Glucose-Capillary: 123 mg/dL — ABNORMAL HIGH (ref 70–99)
Glucose-Capillary: 123 mg/dL — ABNORMAL HIGH (ref 70–99)
Glucose-Capillary: 45 mg/dL — ABNORMAL LOW (ref 70–99)
Glucose-Capillary: 69 mg/dL — ABNORMAL LOW (ref 70–99)
Glucose-Capillary: 71 mg/dL (ref 70–99)

## 2021-01-10 LAB — GLUCOSE, CAPILLARY: Glucose-Capillary: 155 mg/dL — ABNORMAL HIGH (ref 70–99)

## 2021-01-10 LAB — PROTIME-INR
INR: 2.8 — ABNORMAL HIGH (ref 0.8–1.2)
Prothrombin Time: 29.6 seconds — ABNORMAL HIGH (ref 11.4–15.2)

## 2021-01-10 LAB — PROCALCITONIN: Procalcitonin: 28.66 ng/mL

## 2021-01-10 LAB — HEMOGLOBIN A1C
Hgb A1c MFr Bld: 6.9 % — ABNORMAL HIGH (ref 4.8–5.6)
Mean Plasma Glucose: 151.33 mg/dL

## 2021-01-10 LAB — MAGNESIUM: Magnesium: 1 mg/dL — ABNORMAL LOW (ref 1.7–2.4)

## 2021-01-10 LAB — APTT: aPTT: 51 seconds — ABNORMAL HIGH (ref 24–36)

## 2021-01-10 MED ORDER — MAGNESIUM SULFATE 2 GM/50ML IV SOLN
2.0000 g | Freq: Once | INTRAVENOUS | Status: AC
  Administered 2021-01-10: 2 g via INTRAVENOUS
  Filled 2021-01-10: qty 50

## 2021-01-10 MED ORDER — SODIUM CHLORIDE 0.9 % IV SOLN
2.0000 g | Freq: Two times a day (BID) | INTRAVENOUS | Status: DC
  Administered 2021-01-10: 2 g via INTRAVENOUS
  Filled 2021-01-10: qty 2

## 2021-01-10 MED ORDER — DEXTROSE 50 % IV SOLN
INTRAVENOUS | Status: AC
  Administered 2021-01-10: 25 g via INTRAVENOUS
  Filled 2021-01-10: qty 50

## 2021-01-10 MED ORDER — DEXTROSE 50 % IV SOLN: 25.0000 g | INTRAVENOUS | Status: AC

## 2021-01-10 MED ORDER — MORPHINE SULFATE (PF) 2 MG/ML IV SOLN
2.0000 mg | Freq: Once | INTRAVENOUS | Status: AC
  Administered 2021-01-10: 2 mg via INTRAVENOUS
  Filled 2021-01-10: qty 1

## 2021-01-10 MED ORDER — DEXTROSE 250 MG/ML IV SOLN: 25.0000 mg | Freq: Once | INTRAVENOUS | Status: DC

## 2021-01-10 MED ORDER — DEXTROSE 50 % IV SOLN
12.5000 g | INTRAVENOUS | Status: AC
  Administered 2021-01-10: 12.5 g via INTRAVENOUS
  Filled 2021-01-10: qty 50

## 2021-01-10 MED ORDER — DEXTROSE 50 % IV SOLN: 25.0000 g | Freq: Once | INTRAVENOUS | Status: DC

## 2021-01-10 MED ORDER — VANCOMYCIN HCL 750 MG/150ML IV SOLN: 750.0000 mg | INTRAVENOUS | Status: DC

## 2021-01-10 MED ORDER — SODIUM CHLORIDE 0.9 % IV SOLN
2.0000 g | INTRAVENOUS | Status: DC
  Administered 2021-01-11 – 2021-01-14 (×4): 2 g via INTRAVENOUS
  Filled 2021-01-10 (×2): qty 2
  Filled 2021-01-10: qty 20
  Filled 2021-01-10: qty 2
  Filled 2021-01-10: qty 20

## 2021-01-10 MED ORDER — VANCOMYCIN HCL 1500 MG/300ML IV SOLN
1500.0000 mg | INTRAVENOUS | Status: DC
Start: 2021-01-11 — End: 2021-01-10

## 2021-01-10 MED ORDER — SODIUM CHLORIDE 0.9 % IV SOLN
INTRAVENOUS | Status: DC
Start: 2021-01-10 — End: 2021-01-13

## 2021-01-10 NOTE — Procedures (Signed)
Routine EEG Report  Sandra Massey is a 70 y.o. female with a history of spells who is undergoing an EEG to evaluate for seizures.  Report: This EEG was acquired with electrodes placed according to the International 10-20 electrode system (including Fp1, Fp2, F3, F4, C3, C4, P3, P4, O1, O2, T3, T4, T5, T6, A1, A2, Fz, Cz, Pz). The following electrodes were missing or displaced: none.  The occipital dominant rhythm was 6 Hz. This activity is reactive to stimulation. Drowsiness was manifested by background fragmentation; deeper stages of sleep were identified by K complexes and sleep spindles. There was focal slowing over the left temporoparietal region. There were sharp waves noted occasionally (at times, frequently) in the left temporoparietal region. There were no electrographic seizures identified. Photic stimulation and hyperventilation were not performed.  Impression and clinical correlation: This EEG was obtained while awake and asleep and is abnormal due to: - Mild diffuse slowing, indicative of global cerebral dysfunction - Left temporoparietal slowing indicative of superimposed focal cerebral dysfunction in that area - Sharp waves located over the left temporoparietal region indicating increased epileptogenicity in that area  There were no electrographic seizures observed during this recording.  [name], MD [facility] [phone]  If 7pm- 7am, please page neurology on call as listed in [place].

## 2021-01-10 NOTE — Progress Notes (Signed)
Inpatient Diabetes Program Recommendations  AACE/ADA: New Consensus Statement on Inpatient Glycemic Control (2015)  Target Ranges:  Prepandial:   less than 140 mg/dL      [facility] postprandial:   less than 180 mg/dL (1-2 hours)      Critically ill patients:  140 - 180 mg/dL   Lab Results  Component Value Date   GLUCAP 69 (L) 01/10/2021   HGBA1C 6.9 (H) 01/09/2021    Review of Glycemic Control Results for KALEI, MEDA (MRN [id number]) as of 01/10/2021 09:35  Ref. Range 01/09/2021 14:11 01/09/2021 22:09 01/10/2021 00:46 01/10/2021 08:38 01/10/2021 09:31  Glucose-Capillary Latest Ref Range: 70 - 99 mg/dL 126 (H) 70 71 45 (L) 69 (L)   Diabetes history: DM 2 Outpatient Diabetes medications:  Levemir 20 units q 12 noon, Metformin 1000 mg bid, Novolin R 2-10 units qid Current orders for Inpatient glycemic control:  Novolog 0-15 units tid with meals Inpatient Diabetes Program Recommendations:   Consider reducing Novolog correction to sensitive tid with meals- note low blood sugars on admit.   Thanks,  Adah Perl, RN, BC-ADM Inpatient Diabetes Coordinator Pager [phone]  (8a-5p)

## 2021-01-10 NOTE — Progress Notes (Deleted)
CROSS COVERAGE EVENT NOTE    Sandra Massey  JJO:[id number] DOB: Apr 19, 1950 DOA: 01/09/2021 PCP: [name], MD  Event: Agitation, hypotension   Brief Narrative:  Patient admitted earlier with acute mental status changes and is currently undergoing a stroke work-up but she also had findings concerning for sepsis but as no source was found no further work-up was planned for sepsis however during the night she was hypotensive but patient was very agitated hitting the nurses preventing the provision of care   Subjective: Patient agitated, drowsy but arousable, restless unable to get history   Objective: Vitals:   01/09/21 2220 01/09/21 2230 01/09/21 2300 01/09/21 2330  BP: 108/75 (!) 101/57 114/78 115/62  Pulse: 77 83 84 92  Resp:      Temp:      SpO2: 100% 99% 100% 99%  Weight:      Height:        Physical Exam Vitals and nursing note reviewed.  Constitutional:      Appearance: She is obese. She is not toxic-appearing.  HENT:     Head: Normocephalic and atraumatic.  Cardiovascular:     Rate and Rhythm: Normal rate.     Pulses: Normal pulses.     Heart sounds: Normal heart sounds.  Pulmonary:     Breath sounds: Wheezing present.  Abdominal:     General: Bowel sounds are normal.     Tenderness: There is no abdominal tenderness.  Neurological:     Mental Status: She is disoriented.  Psychiatric:        Behavior: Behavior is uncooperative, agitated and combative.    Assessment & Plan:   Active Problems:   Speech abnormality   Sepsis ([place])  Acute agitation - Initially ordered small dose of Ativan which did not help - Geodon 20 mg IM x1.(Did not order Haldol due to QT prolongation - Soft wrist restraints - Bedside sitter  Hypotension, suspecting severe sepsis - Patient presented with WBC 21,000 and lactic acidosis and hypotension though no source - Sepsis work-up ordered - Continue IV fluid resuscitation - Started IV cefepime vancomycin and Flagyl for  sepsis of unknown source pending further work-up  Altered mentation - Likely related to acute problems above, underlying dementia as well as possible stroke - Try to control acute agitation to enable work-up for other illnesses above and stroke     CRITICAL CARE Performed by: Athena Masse  Total critical care time: 75 minutes  Critical care time was exclusive of separately billable procedures and treating other patients.  Critical care was necessary to treat or prevent imminent or life-threatening deterioration.  Critical care was time spent personally by me on the following activities: development of treatment plan with patient and/or surrogate as well as nursing, discussions with consultants, evaluation of patient's response to treatment, examination of patient, obtaining history from patient or surrogate, ordering and performing treatments and interventions, ordering and review of laboratory studies, ordering and review of radiographic studies, pulse oximetry and re-evaluation of patient's condition.   Data Reviewed: I have personally reviewed following labs and imaging studies  CBC: Recent Labs  Lab 01/09/21 1417 01/10/21 0009  WBC 21.2* 33.3*  NEUTROABS 19.1* PENDING  HGB 12.1 11.0*  HCT 38.3 34.6*  MCV 89.7 89.4  PLT 197 160   Basic Metabolic Panel: Recent Labs  Lab 01/09/21 1417  NA 140  K 3.5  CL 105  CO2 20*  GLUCOSE 143*  BUN 36*  CREATININE 2.25*  CALCIUM 8.8*  GFR: Estimated Creatinine Clearance: 34.5 mL/min (A) (by C-G formula based on SCr of 2.25 mg/dL (H)). Liver Function Tests: Recent Labs  Lab 01/09/21 1417  AST 87*  ALT 59*  ALKPHOS 112  BILITOT 1.0  PROT 6.2*  ALBUMIN 2.9*   No results for input(s): LIPASE, AMYLASE in the last 168 hours. No results for input(s): AMMONIA in the last 168 hours. Coagulation Profile: Recent Labs  Lab 01/09/21 1417 01/10/21 0009  INR 2.2* 2.8*   Cardiac Enzymes: No results for input(s): CKTOTAL,  CKMB, CKMBINDEX, TROPONINI in the last 168 hours. BNP (last 3 results) No results for input(s): PROBNP in the last 8760 hours. HbA1C: No results for input(s): HGBA1C in the last 72 hours. CBG: Recent Labs  Lab 01/09/21 1411 01/09/21 2209 01/10/21 0046  GLUCAP 126* 70 71   Lipid Profile: No results for input(s): CHOL, HDL, LDLCALC, TRIG, CHOLHDL, LDLDIRECT in the last 72 hours. Thyroid Function Tests: No results for input(s): TSH, T4TOTAL, FREET4, T3FREE, THYROIDAB in the last 72 hours. Anemia Panel: No results for input(s): VITAMINB12, FOLATE, FERRITIN, TIBC, IRON, RETICCTPCT in the last 72 hours. Urine analysis:    Component Value Date/Time   COLORURINE YELLOW (A) 06/23/2020 2255   APPEARANCEUR HAZY (A) 06/23/2020 2255   LABSPEC 1.011 06/23/2020 2255   PHURINE 5.0 06/23/2020 2255   GLUCOSEU NEGATIVE 06/23/2020 2255   HGBUR SMALL (A) 06/23/2020 2255   BILIRUBINUR NEGATIVE 06/23/2020 2255   KETONESUR NEGATIVE 06/23/2020 2255   PROTEINUR NEGATIVE 06/23/2020 2255   NITRITE NEGATIVE 06/23/2020 2255   LEUKOCYTESUR MODERATE (A) 06/23/2020 2255   Sepsis Labs: @LABRCNTIP (procalcitonin:4,lacticidven:4)  ) Recent Results (from the past 240 hour(s))  Resp Panel by RT-PCR (Flu A&B, Covid) Nasopharyngeal Swab     Status: None   Collection Time: 01/09/21  2:17 PM   Specimen: Nasopharyngeal Swab; Nasopharyngeal(NP) swabs in vial transport medium  Result Value Ref Range Status   SARS Coronavirus 2 by RT PCR NEGATIVE NEGATIVE Final    Comment: (NOTE) SARS-CoV-2 target nucleic acids are NOT DETECTED.  The SARS-CoV-2 RNA is generally detectable in upper respiratory specimens during the acute phase of infection. The lowest concentration of SARS-CoV-2 viral copies this assay can detect is 138 copies/mL. A negative result does not preclude SARS-Cov-2 infection and should not be used as the sole basis for treatment or other patient management decisions. A negative result may occur with   improper specimen collection/handling, submission of specimen other than nasopharyngeal swab, presence of viral mutation(s) within the areas targeted by this assay, and inadequate number of viral copies(<138 copies/mL). A negative result must be combined with clinical observations, patient history, and epidemiological information. The expected result is Negative.  Fact Sheet for Patients:  EntrepreneurPulse.com.au  Fact Sheet for Healthcare Providers:  IncredibleEmployment.be  This test is no t yet approved or cleared by the [place] FDA and  has been authorized for detection and/or diagnosis of SARS-CoV-2 by FDA under an Emergency Use Authorization (EUA). This EUA will remain  in effect (meaning this test can be used) for the duration of the COVID-19 declaration under Section 564(b)(1) of the Act, 21 U.S.C.section 360bbb-3(b)(1), unless the authorization is terminated  or revoked sooner.       Influenza A by PCR NEGATIVE NEGATIVE Final   Influenza B by PCR NEGATIVE NEGATIVE Final    Comment: (NOTE) The Xpert Xpress SARS-CoV-2/FLU/RSV plus assay is intended as an aid in the diagnosis of influenza from Nasopharyngeal swab specimens and should not be used as a  sole basis for treatment. Nasal washings and aspirates are unacceptable for Xpert Xpress SARS-CoV-2/FLU/RSV testing.  Fact Sheet for Patients: EntrepreneurPulse.com.au  Fact Sheet for Healthcare Providers: IncredibleEmployment.be  This test is not yet approved or cleared by the [place] FDA and has been authorized for detection and/or diagnosis of SARS-CoV-2 by FDA under an Emergency Use Authorization (EUA). This EUA will remain in effect (meaning this test can be used) for the duration of the COVID-19 declaration under Section 564(b)(1) of the Act, 21 U.S.C. section 360bbb-3(b)(1), unless the authorization is terminated  or revoked.  Performed at [facility], [address], [place],  [address]       Radiology Studies: DG Abd 1 View  Result Date: 01/09/2021 CLINICAL DATA:  MRI clearance. EXAM: ABDOMEN - 1 VIEW COMPARISON:  05/31/2019 FINDINGS: 16 mm stone projects over the lower pole of the right kidney. Prior cholecystectomy. Prominent small bowel loops in the left abdomen. Gas within nondistended large bowel. No organomegaly or free air. IMPRESSION: Cholecystectomy clips in the right upper quadrant. No other radiopaque foreign body. Right lower pole nephrolithiasis. Electronically Signed   By: Rolm Baptise M.D.   On: 01/09/2021 19:30   MR BRAIN WO CONTRAST  Result Date: 01/09/2021 CLINICAL DATA:  Acute neurologic deficit EXAM: MRI HEAD WITHOUT CONTRAST TECHNIQUE: Multiplanar, multiecho pulse sequences of the brain and surrounding structures were obtained without intravenous contrast. COMPARISON:  None. FINDINGS: Brain: No acute infarct, mass effect or extra-axial collection. 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 Small amount of associated chronic blood products. Moderate bilateral periventricular white matter hyperintensity. Ex vacuo dilatation of the left lateral ventricle. Vascular: Major flow voids are preserved. Skull and upper cervical spine: Normal calvarium and skull base. Visualized upper cervical spine and soft tissues are normal. Sinuses/Orbits:No paranasal sinus fluid levels or advanced mucosal thickening. No mastoid or middle ear effusion. Normal orbits. IMPRESSION: 1. No acute intracranial abnormality. 2. 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 Electronically Signed   By: Ulyses Jarred M.D.   On: 01/09/2021 20:50   DG Chest Port 1 View  Result Date: 01/09/2021 CLINICAL DATA:  Shortness of breath.  Slurred speech. EXAM: PORTABLE CHEST 1 VIEW COMPARISON:  04/06/2020 FINDINGS: Stable mild cardiomegaly. Aortic atherosclerotic calcification  noted. Both lungs are clear. IMPRESSION: Stable mild cardiomegaly. No active lung disease. Electronically Signed   By: Marlaine Hind M.D.   On: 01/09/2021 14:51   EEG adult  Result Date: 01/09/2021 Lora Havens, MD     01/09/2021  7:18 PM Patient Name: LINDE WILENSKY MRN: [id number] Epilepsy Attending: Lora Havens Referring Physician/Provider: Dr Kerney Elbe Date: 01/09/2021 Duration: 20.56 mins Patient history: 70 year old female SN[facility] resident with a history of atrial fibrillation, on Eliquis, who presents with acute onset of altered speech.  EEG to evaluate for seizure. Level of alertness: Awake, asleep AEDs during EEG study: None Technical aspects: This EEG study was done with scalp electrodes positioned according to the 10-20 International system of electrode placement. Electrical activity was acquired at a sampling rate of 500Hz  and reviewed with a high frequency filter of 70Hz  and a low frequency filter of 1Hz . EEG data were recorded continuously and digitally stored. Description: The posterior dominant rhythm consists of 8 Hz activity of moderate voltage (25-35 uV) seen predominantly in posterior head regions, asymmetric ( left<right) and reactive to eye opening and eye closing. Sleep was characterized by vertex waves, sleep spindles (12 to 14 Hz), maximal frontocentral region.  EEG showed continuous 3 to 6 Hz theta-delta slowing in left hemisphere, maximal left posterior quadrant. Spikes were also noted in left posterior temporal region. Physiologic photic driving was not seen during photic stimulation. Hyperventilation was not performed.   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 There is also cortical dysfunction arising from left hemisphere, maximal left posterior quadrant,  likely  secondary to underlying structural abnormality, post-ictal state. No seizures were seen throughout the recording. Dr Cheral Marker was notified. Lora Havens   CT HEAD CODE STROKE WO CONTRAST  Result Date: 01/09/2021 CLINICAL DATA:  Code stroke. EXAM: CT HEAD WITHOUT CONTRAST TECHNIQUE: Contiguous axial images were obtained from the base of the skull through the vertex without intravenous contrast. COMPARISON:  04/06/2020 FINDINGS: Brain: There is no acute intracranial hemorrhage, mass effect, or edema. Chronic infarct left MCA territory with associated volume loss. No new loss of gray-white differentiation. Additional areas low-density in the supratentorial white matter are nonspecific but probably reflect stable chronic microvascular ischemic changes. Vascular: No hyperdense vessel. There is intracranial atherosclerotic calcification at the skull base. Skull: Unremarkable. Sinuses/Orbits: Aerated.  Orbits are unremarkable. Other: Mastoid air cells are clear. ASPECTS ([place] Stroke Program Early CT Score) - Ganglionic level infarction (caudate, lentiform nuclei, internal capsule, insula, M1-M3 cortex): 7 - Supraganglionic infarction (M4-M6 cortex): 3 Total score (0-10 with 10 being normal): 10 IMPRESSION: There is no acute intracranial hemorrhage or evidence of acute infarction. ASPECT score is 10. 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 These results were communicated to Dr. Cheral Marker at 2:08 pm on 01/09/2021 by text page via the [facility] messaging system. Electronically Signed   By: Macy Mis M.D.   On: 01/09/2021 14:14    Scheduled Meds:  amiodarone  200 mg Oral BID   enoxaparin (LOVENOX) injection  0.5 mg/kg Subcutaneous Q24H   insulin aspart  0-15 Units Subcutaneous TID WC   rosuvastatin  40 mg Oral QPM   Continuous Infusions:  sodium chloride     ceFEPime (MAXIPIME) IV     lactated ringers     And   lactated ringers     And   lactated  ringers     And   lactated ringers     And   lactated ringers     levETIRAcetam     metronidazole     vancomycin       LOS: 1 day    Time spent: [place], MD [facility]  01/10/2021, 12:48 AM

## 2021-01-10 NOTE — ED Notes (Signed)
Patient continues to moan and scream out in pain.  Report pain and points to bladder/groin.  C/o severe pain and has scratched herself in suprapubic area due to amount of pain.  Patient requesting something to help her not have to urinate.  Would like to have a foley placed.  MD messaged and made aware of patient's request

## 2021-01-10 NOTE — Progress Notes (Signed)
PROGRESS NOTE    Sandra Massey  HUT:[id number] DOB: [birth year] DOA: 01/09/2021 PCP: [name], MD   Brief Narrative: This 70 yrs old  female who is Jehovah's Witness with PMH significant of old left MCA territory infarct, with right-sided hemiparesis, chronic A. fib, insulin-dependent diabetes mellitus, hypertension who was sent from SNF for a change in speech.Not able to obtain hx from pt due her speech difficulty and apparent cognitive deficits. CT Head : no acute findings. Neurology consulted recommended stroke work-up.  EEG showed evidence of epileptogenicity arising in the left posterior temporal region. Patient is started on Keppra 500 mg twice daily.  Neurology recommended repeat EEG and frequent neurochecks.   Patient became hypotensive requiring IV fluids.  Blood cultures grew E. coli started on IV ceftriaxone.  Assessment & Plan:   Active Problems:   Speech abnormality   Sepsis ([place])   Expressive aphasia: PMH : Hx of left MCA territory infarct, with right-sided hemiparesis: Patient was noted to have a change in her speech, she was also noted to have baseline difficulty speaking on her prior admission. Neurology consulted and initiated a stroke work-up. MRI brain: No acute intracranial abnormality.  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 EEG showed evidence of epileptogenicity arising in the left posterior temporal region. Neurology recommended to repeat EEG,  started on Keppra.  Severe sepsis /E. coli bacteremia: Patient presented with leukocytosis, hypotension, elevated lactic acid.  Blood cultures E. Coli+ WBC 21.2, procalcitonin 43.2, lactic acid 4.1. Hypotension improved with IV hydration. Started on empiric cefepime and vancomycin. Antibiotic de-escalated to ceftriaxone. Will consult infectious disease in the morning.  Acute kidney injury. Baseline creatinine 1.58, creatinine on admission 2.25 Suspect due to hypotension. Continue IV  hydration.  Monitor serum creatinine.   Elevated troponin: 157 > 147 . Patient denies any chest pain. Patient has mildly elevated trop at baseline, ~100's.   Chronic A. fib on Eliquis Continue amiodarone Continue Eliquis.   Essential hypertension. Currently hypotensive Hold amlodipine.   Type 2 diabetes -ACHS BG checks and SSI TID for now.  Hypomagnesemia: Replaced, continue to monitor.  Elevated INR Elevated liver enzymes Unclear etiology.  No history of cirrhosis Monitor for now.   Cognitive deficits Could be secondary to dementia.     DVT prophylaxis: SCDs Code Status: DNR Family Communication: No family at bed side. Disposition Plan:    Status is: Inpatient  Remains inpatient appropriate because: Severe sepsis, slurred speech.  Consultants:  Neurology.  Procedures: MRI/EEG Antimicrobials:   Anti-infectives (From admission, onward)    Start     Dose/Rate Route Frequency Ordered Stop   01/11/21 1300  vancomycin (VANCOREADY) IVPB 750 mg/150 mL  Status:  Discontinued        750 mg 150 mL/hr over 60 Minutes Intravenous Every 24 hours 01/10/21 0235 01/10/21 1116   01/11/21 0800  vancomycin (VANCOREADY) IVPB 1500 mg/300 mL  Status:  Discontinued        1,500 mg 150 mL/hr over 120 Minutes Intravenous Every 48 hours 01/10/21 1116 01/10/21 1448   01/11/21 0800  cefTRIAXone (ROCEPHIN) 2 g in sodium chloride 0.9 % 100 mL IVPB        2 g 200 mL/hr over 30 Minutes Intravenous Every 24 hours 01/10/21 1448     01/10/21 1200  ceFEPIme (MAXIPIME) 2 g in sodium chloride 0.9 % 100 mL IVPB  Status:  Discontinued        2 g 200 mL/hr over 30 Minutes Intravenous Every 12 hours 01/10/21  5329 01/10/21 1448   01/09/21 2245  metroNIDAZOLE (FLAGYL) IVPB 500 mg  Status:  Discontinued        500 mg 100 mL/hr over 60 Minutes Intravenous 2 times daily 01/09/21 2228 01/10/21 1448   01/09/21 2245  vancomycin (VANCOREADY) IVPB 2000 mg/400 mL        2,000 mg 200 mL/hr over 120  Minutes Intravenous  Once 01/09/21 2234 01/10/21 0708   01/09/21 2230  ceFEPIme (MAXIPIME) 2 g in sodium chloride 0.9 % 100 mL IVPB        2 g 200 mL/hr over 30 Minutes Intravenous  Once 01/09/21 2228 01/10/21 0204   01/09/21 2230  vancomycin (VANCOCIN) IVPB 1000 mg/200 mL premix  Status:  Discontinued        1,000 mg 200 mL/hr over 60 Minutes Intravenous  Once 01/09/21 2228 01/09/21 2234       Subjective: Patient was seen and examined at bedside.  Overnight events noted.   Patient seems confused but following commands.  She is not able to ambulate as per patient.  Objective: Vitals:   01/10/21 0930 01/10/21 1000 01/10/21 1030 01/10/21 1100  BP: 117/78 93/80 115/67 129/75  Pulse: (!) 104 95 (!) 103 (!) 108  Resp: 19 (!) 25 (!) 27 (!) 23  Temp:      SpO2: 100% 96% 98% 91%  Weight:      Height:        Intake/Output Summary (Last 24 hours) at 01/10/2021 1645 Last data filed at 01/10/2021 9242 Gross per 24 hour  Intake 3200 ml  Output --  Net 3200 ml   Filed Weights   01/09/21 1352  Weight: 136.1 kg    Examination:  General exam: He is comfortable, not in any acute distress.  Deconditioned Respiratory system: Clear to auscultation. Respiratory effort normal. Cardiovascular system: S1-S2 heard, regular rate and rhythm, no murmur. Gastrointestinal system: Abdomen is soft, nontender, nondistended, BS + Central nervous system: Alert and oriented. No focal neurological deficits. Extremities: No edema, no cyanosis, no clubbing. Skin: No rashes, lesions or ulcers Psychiatry: Judgement and insight appear normal. Mood & affect appropriate.     Data Reviewed: I have personally reviewed following labs and imaging studies  CBC: Recent Labs  Lab 01/09/21 1417 01/10/21 0009 01/10/21 0457  WBC 21.2* 33.3* 27.7*  NEUTROABS 19.1* 28.9*  --   HGB 12.1 11.0* 10.6*  HCT 38.3 34.6* 32.9*  MCV 89.7 89.4 91.1  PLT 197 179 683   Basic Metabolic Panel: Recent Labs  Lab  01/09/21 1417 01/10/21 0009 01/10/21 0457  NA 140 138 138  K 3.5 4.2 4.5  CL 105 104 106  CO2 20* 22 20*  GLUCOSE 143* 80 68*  BUN 36* 42* 42*  CREATININE 2.25* 2.60* 2.78*  CALCIUM 8.8* 8.4* 8.2*  MG  --   --  1.0*   GFR: Estimated Creatinine Clearance: 27.9 mL/min (A) (by C-G formula based on SCr of 2.78 mg/dL (H)). Liver Function Tests: Recent Labs  Lab 01/09/21 1417 01/10/21 0009  AST 87* 181*  ALT 59* 189*  ALKPHOS 112 62  BILITOT 1.0 1.0  PROT 6.2* 5.8*  ALBUMIN 2.9* 2.7*   No results for input(s): LIPASE, AMYLASE in the last 168 hours. No results for input(s): AMMONIA in the last 168 hours. Coagulation Profile: Recent Labs  Lab 01/09/21 1417 01/10/21 0009  INR 2.2* 2.8*   Cardiac Enzymes: No results for input(s): CKTOTAL, CKMB, CKMBINDEX, TROPONINI in the last 168 hours. BNP (last 3  results) No results for input(s): PROBNP in the last 8760 hours. HbA1C: Recent Labs    01/09/21 1735  HGBA1C 6.9*   CBG: Recent Labs  Lab 01/09/21 2209 01/10/21 0046 01/10/21 0838 01/10/21 0931 01/10/21 1336  GLUCAP 70 71 45* 69* 123*   Lipid Profile: No results for input(s): CHOL, HDL, LDLCALC, TRIG, CHOLHDL, LDLDIRECT in the last 72 hours. Thyroid Function Tests: No results for input(s): TSH, T4TOTAL, FREET4, T3FREE, THYROIDAB in the last 72 hours. Anemia Panel: No results for input(s): VITAMINB12, FOLATE, FERRITIN, TIBC, IRON, RETICCTPCT in the last 72 hours. Sepsis Labs: Recent Labs  Lab 01/09/21 1542 01/09/21 1735 01/10/21 0008 01/10/21 0457  PROCALCITON 43.25  --   --  28.66  LATICACIDVEN 8.3* 7.1* 4.4* 4.1*    Recent Results (from the past 240 hour(s))  Resp Panel by RT-PCR (Flu A&B, Covid) Nasopharyngeal Swab     Status: None   Collection Time: 01/09/21  2:17 PM   Specimen: Nasopharyngeal Swab; Nasopharyngeal(NP) swabs in vial transport medium  Result Value Ref Range Status   SARS Coronavirus 2 by RT PCR NEGATIVE NEGATIVE Final    Comment:  (NOTE) SARS-CoV-2 target nucleic acids are NOT DETECTED.  The SARS-CoV-2 RNA is generally detectable in upper respiratory specimens during the acute phase of infection. The lowest concentration of SARS-CoV-2 viral copies this assay can detect is 138 copies/mL. A negative result does not preclude SARS-Cov-2 infection and should not be used as the sole basis for treatment or other patient management decisions. A negative result may occur with  improper specimen collection/handling, submission of specimen other than nasopharyngeal swab, presence of viral mutation(s) within the areas targeted by this assay, and inadequate number of viral copies(<138 copies/mL). A negative result must be combined with clinical observations, patient history, and epidemiological information. The expected result is Negative.  Fact Sheet for Patients:  EntrepreneurPulse.com.au  Fact Sheet for Healthcare Providers:  IncredibleEmployment.be  This test is no t yet approved or cleared by the [place] FDA and  has been authorized for detection and/or diagnosis of SARS-CoV-2 by FDA under an Emergency Use Authorization (EUA). This EUA will remain  in effect (meaning this test can be used) for the duration of the COVID-19 declaration under Section 564(b)(1) of the Act, 21 U.S.C.section 360bbb-3(b)(1), unless the authorization is terminated  or revoked sooner.       Influenza A by PCR NEGATIVE NEGATIVE Final   Influenza B by PCR NEGATIVE NEGATIVE Final    Comment: (NOTE) The Xpert Xpress SARS-CoV-2/FLU/RSV plus assay is intended as an aid in the diagnosis of influenza from Nasopharyngeal swab specimens and should not be used as a sole basis for treatment. Nasal washings and aspirates are unacceptable for Xpert Xpress SARS-CoV-2/FLU/RSV testing.  Fact Sheet for Patients: EntrepreneurPulse.com.au  Fact Sheet for Healthcare  Providers: IncredibleEmployment.be  This test is not yet approved or cleared by the [place] FDA and has been authorized for detection and/or diagnosis of SARS-CoV-2 by FDA under an Emergency Use Authorization (EUA). This EUA will remain in effect (meaning this test can be used) for the duration of the COVID-19 declaration under Section 564(b)(1) of the Act, 21 U.S.C. section 360bbb-3(b)(1), unless the authorization is terminated or revoked.  Performed at [facility], [address] [place], [place] [address]   Culture, blood (Routine X 2) w Reflex to ID Panel     Status: None (Preliminary result)   Collection Time: 01/10/21 12:09 AM   Specimen: BLOOD  Result Value Ref Range  Status   Specimen Description BLOOD RIGHT FOREARM  Final   Special Requests   Final    BOTTLES DRAWN AEROBIC AND ANAEROBIC Blood Culture results may not be optimal due to an excessive volume of blood received in culture bottles   Culture  Setup Time   Final    ANAEROBIC BOTTLE ONLY GRAM NEGATIVE RODS AEROBIC BOTTLE ONLY CRITICAL VALUE NOTED.  VALUE IS CONSISTENT WITH PREVIOUSLY REPORTED AND CALLED VALUE. Performed at [facility], [address] [place], [place] [address]    Culture GRAM NEGATIVE RODS  Final   Report Status PENDING  Incomplete  Culture, blood (Routine X 2) w Reflex to ID Panel     Status: None (Preliminary result)   Collection Time: 01/10/21 12:09 AM   Specimen: BLOOD  Result Value Ref Range Status   Specimen Description   Final    BLOOD LEFT ANTECUBITAL Performed at [facility], [place] [address], [place], [place] [address]    Special Requests   Final    BOTTLES DRAWN AEROBIC AND ANAEROBIC Blood Culture adequate volume   Culture  Setup Time   Final    GRAM NEGATIVE RODS IN BOTH AEROBIC AND ANAEROBIC BOTTLES Organism ID to follow CRITICAL RESULT CALLED TO, READ BACK BY AND VERIFIED WITH: RAQUEL GOODMAN, PHARMD AT 1350 ON 01/10/21 BY  GM Performed at [facility], [address] [place],  [address]    Culture GRAM NEGATIVE RODS  Final   Report Status PENDING  Incomplete  Blood Culture ID Panel (Reflexed)     Status: Abnormal   Collection Time: 01/10/21 12:09 AM  Result Value Ref Range Status   Enterococcus faecalis NOT DETECTED NOT DETECTED Final   Enterococcus Faecium NOT DETECTED NOT DETECTED Final   Listeria monocytogenes NOT DETECTED NOT DETECTED Final   Staphylococcus species NOT DETECTED NOT DETECTED Final   Staphylococcus aureus (BCID) NOT DETECTED NOT DETECTED Final   Staphylococcus epidermidis NOT DETECTED NOT DETECTED Final   Staphylococcus lugdunensis NOT DETECTED NOT DETECTED Final   Streptococcus species NOT DETECTED NOT DETECTED Final   Streptococcus agalactiae NOT DETECTED NOT DETECTED Final   Streptococcus pneumoniae NOT DETECTED NOT DETECTED Final   Streptococcus pyogenes NOT DETECTED NOT DETECTED Final   A.calcoaceticus-baumannii NOT DETECTED NOT DETECTED Final   Bacteroides fragilis NOT DETECTED NOT DETECTED Final   Enterobacterales DETECTED (A) NOT DETECTED Final    Comment: Enterobacterales represent a large order of gram negative bacteria, not a single organism. CRITICAL RESULT CALLED TO, READ BACK BY AND VERIFIED WITH: [place], PHARMD AT 1350 ON 01/10/21 BY GM    Enterobacter cloacae complex NOT DETECTED NOT DETECTED Final   Escherichia coli DETECTED (A) NOT DETECTED Final    Comment: CRITICAL RESULT CALLED TO, READ BACK BY AND VERIFIED WITH: [place], PHARMD AT 1350 ON 01/10/21 BY GM    Klebsiella aerogenes NOT DETECTED NOT DETECTED Final   Klebsiella oxytoca NOT DETECTED NOT DETECTED Final   Klebsiella pneumoniae NOT DETECTED NOT DETECTED Final   Proteus species NOT DETECTED NOT DETECTED Final   Salmonella species NOT DETECTED NOT DETECTED Final   Serratia marcescens NOT DETECTED NOT DETECTED Final   Haemophilus influenzae NOT DETECTED NOT DETECTED Final    Neisseria meningitidis NOT DETECTED NOT DETECTED Final   Pseudomonas aeruginosa NOT DETECTED NOT DETECTED Final   Stenotrophomonas maltophilia NOT DETECTED NOT DETECTED Final   Candida albicans NOT DETECTED NOT DETECTED Final   Candida auris NOT DETECTED NOT DETECTED Final  Candida glabrata NOT DETECTED NOT DETECTED Final   Candida krusei NOT DETECTED NOT DETECTED Final   Candida parapsilosis NOT DETECTED NOT DETECTED Final   Candida tropicalis NOT DETECTED NOT DETECTED Final   Cryptococcus neoformans/gattii NOT DETECTED NOT DETECTED Final   CTX-M ESBL NOT DETECTED NOT DETECTED Final   Carbapenem resistance IMP NOT DETECTED NOT DETECTED Final   Carbapenem resistance KPC NOT DETECTED NOT DETECTED Final   Carbapenem resistance NDM NOT DETECTED NOT DETECTED Final   Carbapenem resist OXA 48 LIKE NOT DETECTED NOT DETECTED Final   Carbapenem resistance VIM NOT DETECTED NOT DETECTED Final    Comment: Performed at [facility], [address], [place], [place] [address]    Radiology Studies: DG Abd 1 View  Result Date: 01/09/2021 CLINICAL DATA:  MRI clearance. EXAM: ABDOMEN - 1 VIEW COMPARISON:  05/31/2019 FINDINGS: 16 mm stone projects over the lower pole of the right kidney. Prior cholecystectomy. Prominent small bowel loops in the left abdomen. Gas within nondistended large bowel. No organomegaly or free air. IMPRESSION: Cholecystectomy clips in the right upper quadrant. No other radiopaque foreign body. Right lower pole nephrolithiasis. Electronically Signed   By: Rolm Baptise M.D.   On: 01/09/2021 19:30   MR BRAIN WO CONTRAST  Result Date: 01/09/2021 CLINICAL DATA:  Acute neurologic deficit EXAM: MRI HEAD WITHOUT CONTRAST TECHNIQUE: Multiplanar, multiecho pulse sequences of the brain and surrounding structures were obtained without intravenous contrast. COMPARISON:  None. FINDINGS: Brain: No acute infarct, mass effect or extra-axial collection. Large area of chronic left MCA  territory encephalomalacia. Small amount of associated chronic blood products. Moderate bilateral periventricular white matter hyperintensity. Ex vacuo dilatation of the left lateral ventricle. Vascular: Major flow voids are preserved. Skull and upper cervical spine: Normal calvarium and skull base. Visualized upper cervical spine and soft tissues are normal. Sinuses/Orbits:No paranasal sinus fluid levels or advanced mucosal thickening. No mastoid or middle ear effusion. Normal orbits. IMPRESSION: 1. No acute intracranial abnormality. 2. 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 Electronically Signed   By: Ulyses Jarred M.D.   On: 01/09/2021 20:50   US RENAL  Result Date: 01/10/2021 CLINICAL DATA:  UTI. EXAM: RENAL / URINARY TRACT ULTRASOUND COMPLETE COMPARISON:  None. FINDINGS: Right Kidney: Renal measurements: 11.5 cm x 6.1 cm x 6.0 cm = volume: 222 mL. Diffusely increased echogenicity of the renal parenchyma is noted. No mass or hydronephrosis visualized. Left Kidney: Renal measurements: 11.1 cm x 6.0 cm x 4.9 cm = volume: 172 mL. Echogenicity within normal limits. A 2.0 cm x 1.6 cm x 1.6 cm anechoic structure is seen within the upper pole of the left kidney. No hydronephrosis is visualized. Bladder: Appears normal for degree of bladder distention. Other: The study is limited secondary to the patient's body habitus. IMPRESSION: 1. Diffusely increased echogenicity of the right kidney which may be secondary to a diffuse infectious or inflammatory process. Medical renal disease cannot be excluded. 2. Small left renal cyst. Electronically Signed   By: Virgina Norfolk M.D.   On: 01/10/2021 15:46   DG Chest Port 1 View  Result Date: 01/09/2021 CLINICAL DATA:  Shortness of breath.  Slurred speech. EXAM: PORTABLE CHEST 1 VIEW COMPARISON:  04/06/2020 FINDINGS: Stable mild cardiomegaly. Aortic atherosclerotic calcification noted. Both lungs are clear. IMPRESSION: Stable mild cardiomegaly. No  active lung disease. Electronically Signed   By: Marlaine Hind M.D.   On: 01/09/2021 14:51   EEG adult  Result Date: 01/10/2021 [name], MD  01/10/2021  1:42 PM Routine EEG Report Sandra Massey is a 70 y.o. female with a history of spells who is undergoing an EEG to evaluate for seizures. Report: This EEG was acquired with electrodes placed according to the International 10-20 electrode system (including Fp1, Fp2, F3, F4, C3, C4, P3, P4, O1, O2, T3, T4, T5, T6, A1, A2, Fz, Cz, Pz). The following electrodes were missing or displaced: none. The occipital dominant rhythm was 6 Hz. This activity is reactive to stimulation. Drowsiness was manifested by background fragmentation; deeper stages of sleep were identified by K complexes and sleep spindles. There was focal slowing over the left temporoparietal region. There were sharp waves noted occasionally (at times, frequently) in the left temporoparietal region. There were no electrographic seizures identified. Photic stimulation and hyperventilation were not performed. Impression and clinical correlation: This EEG was obtained while awake and asleep and is abnormal due to: - Mild diffuse slowing, indicative of global cerebral dysfunction - Left temporoparietal slowing indicative of superimposed focal cerebral dysfunction in that area - Sharp waves located over the left temporoparietal region indicating increased epileptogenicity in that area There were no electrographic seizures observed during this recording. [name], MD [facility] [phone] If 7pm- 7am, please page neurology on call as listed in [place].   EEG adult  Result Date: 01/09/2021 Lora Havens, MD     01/09/2021  7:18 PM Patient Name: Sandra Massey MRN: [id number] Epilepsy Attending: Lora Havens Referring Physician/Provider: Dr Kerney Elbe Date: 01/09/2021 Duration: 20.56 mins Patient history: 70 year old female SNF resident with a history of atrial fibrillation, on  Eliquis, who presents with acute onset of altered speech.  EEG to evaluate for seizure. Level of alertness: Awake, asleep AEDs during EEG study: None Technical aspects: This EEG study was done with scalp electrodes positioned according to the 10-20 International system of electrode placement. Electrical activity was acquired at a sampling rate of 500Hz  and reviewed with a high frequency filter of 70Hz  and a low frequency filter of 1Hz . EEG data were recorded continuously and digitally stored. Description: The posterior dominant rhythm consists of 8 Hz activity of moderate voltage (25-35 uV) seen predominantly in posterior head regions, asymmetric ( left<right) and reactive to eye opening and eye closing. Sleep was characterized by vertex waves, sleep spindles (12 to 14 Hz), maximal frontocentral region.  EEG showed continuous 3 to 6 Hz theta-delta slowing in left hemisphere, maximal left posterior quadrant. Spikes were also noted in left posterior temporal region. Physiologic photic driving was not seen during photic stimulation. Hyperventilation was not performed.   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 There is also cortical dysfunction arising from left hemisphere, maximal left posterior quadrant,  likely secondary to underlying structural abnormality, post-ictal state. No seizures were seen throughout the recording. Dr Cheral Marker was notified. Lora Havens   CT HEAD CODE STROKE WO CONTRAST  Result Date: 01/09/2021 CLINICAL DATA:  Code stroke. EXAM: CT HEAD WITHOUT CONTRAST TECHNIQUE: Contiguous axial images were obtained from the base of the skull through the vertex without intravenous contrast. COMPARISON:  04/06/2020 FINDINGS: Brain: There is no acute intracranial hemorrhage, mass effect,  or edema. Chronic infarct left MCA territory with associated volume loss. No new loss of gray-white differentiation. Additional areas low-density in the supratentorial white matter are nonspecific but probably reflect stable chronic microvascular  ischemic changes. Vascular: No hyperdense vessel. There is intracranial atherosclerotic calcification at the skull base. Skull: Unremarkable. Sinuses/Orbits: Aerated.  Orbits are unremarkable. Other: Mastoid air cells are clear. ASPECTS ([place] Stroke Program Early CT Score) - Ganglionic level infarction (caudate, lentiform nuclei, internal capsule, insula, M1-M3 cortex): 7 - Supraganglionic infarction (M4-M6 cortex): 3 Total score (0-10 with 10 being normal): 10 IMPRESSION: There is no acute intracranial hemorrhage or evidence of acute infarction. ASPECT score is 10. 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 These results were communicated to Dr. Cheral Marker at 2:08 pm on 01/09/2021 by text page via the [facility] messaging system. Electronically Signed   By: Macy Mis M.D.   On: 01/09/2021 14:14    Scheduled Meds:  amiodarone  200 mg Oral BID   enoxaparin (LOVENOX) injection  0.5 mg/kg Subcutaneous Q24H   insulin aspart  0-15 Units Subcutaneous TID WC   rosuvastatin  40 mg Oral QPM   Continuous Infusions:  [START ON 01/11/2021] cefTRIAXone (ROCEPHIN)  IV     levETIRAcetam Stopped (01/10/21 0856)     LOS: 1 day    Time spent: 35 mins    Elisabel Hanover, MD [facility]   If 7PM-7AM, please contact night-coverage

## 2021-01-10 NOTE — ED Notes (Signed)
Patient pulled out her IV.  New IV attempted by another RN.  Attempts were unsuccessful.  Order placed for IV team

## 2021-01-10 NOTE — Care Management Note (Signed)
  Cross coverage note    Reason for call Hematuria, dysuria and pain on urination, poor urine output .  Nurse requesting Foley  Brief summary: Patient admitted with severe sepsis.  Patient unable to void and hematuria with small amounts of urine  Objective: Vitals:   01/10/21 0330 01/10/21 0345  BP: 93/63   Pulse:  97  Resp: (!) 23 (!) 24  Temp:    SpO2:  99%     Assessment: Possible urinary retention   Plan: Foley catheter

## 2021-01-10 NOTE — ED Notes (Signed)
Patient currently resting quietly.  Has not moaned or cried out since catheter placed

## 2021-01-10 NOTE — ED Notes (Signed)
Patient provided with warm blankets for comfort 

## 2021-01-10 NOTE — ED Notes (Signed)
Kara RN aware of assigned bed 

## 2021-01-10 NOTE — Progress Notes (Signed)
Pharmacy Antibiotic Note  Sandra Massey is a 70 y.o. female admitted on 01/09/2021 with infection form unknown source.  Pharmacy has been consulted for Cefepime and Vancomycin dosing x 7 days.  Plan: Cefepime 2 gm q12h per indication and renal fxn.  Vancomycin Pt given initial dose of  Vancomycin 2 gm.  Vancomycin 750 mg IV Q 24 hrs. Goal AUC 400-550. Expected AUC: 522 SCr used: 2.6, Vd used: 0.5, BMI 46.9  Pharmacy will continue to follow and adjust abx dosing when warranted.   Height: 5\' 7"  (170.2 cm) Weight: 136.1 kg (300 lb) IBW/kg (Calculated) : 61.6  Temp (24hrs), Avg:98.3 F (36.8 C), Min:98.3 F (36.8 C), Max:98.3 F (36.8 C)  Recent Labs  Lab 01/09/21 1417 01/09/21 1542 01/09/21 1735 01/10/21 0008 01/10/21 0009  WBC 21.2*  --   --   --  33.3*  CREATININE 2.25*  --   --   --  2.60*  LATICACIDVEN  --  8.3* 7.1* 4.4*  --     Estimated Creatinine Clearance: 29.9 mL/min (A) (by C-G formula based on SCr of 2.6 mg/dL (H)).    Allergies  Allergen Reactions   Latex Rash    Antimicrobials this admission: 11/2 Cefepime >> x 7 days 11/2 Vancomycin >> x 7 days 11/2 Flagyl >> x 7 days  Microbiology results: 11/2 BCx: Pending  Thank you for allowing pharmacy to be a part of this patient's care.  Renda Rolls, PharmD, [facility] 01/10/2021 2:29 AM

## 2021-01-10 NOTE — Care Management Note (Signed)
Cross coverage note      Reason for call Hypotension with SBP in the 70s  Brief summary: Chart reviewed.  Patient came into the ER with a diagnosis of sepsis secondary to UTI presenting with hypothermia, hypotension and altered mental status Patient has received a total of 3400 mL fluid and is currently on maintenance at 125 mils follow-up     Subjective: Patient unable to contribute to history due to altered mental status and clinical state   Objective:         01/09/21 2140    100/64    72    (!) 27         96%      Assessment: Severe sepsis, not fluid responsive  Plan: Will give 1 more liter LR and if not responding will do ICU consult for pressors. Family to be updated on prognosis given advanced age of 79   Plan discussed with the nurse over the phone

## 2021-01-10 NOTE — ED Notes (Signed)
Patient complaining of pain with external catheter.  Urine noted to be dark red in color.  External catheter removed.  Patient educated on importance of letting RN know when she needs to urinate.

## 2021-01-10 NOTE — ED Notes (Signed)
IV team at bedside 

## 2021-01-10 NOTE — Progress Notes (Addendum)
PHARMACY - PHYSICIAN COMMUNICATION CRITICAL VALUE ALERT - BLOOD CULTURE IDENTIFICATION (BCID)  Sandra Massey is an 70 y.o. female who presented to [facility] on 01/09/2021 with a chief complaint of altered mental status.  Assessment: Blood cultures positive. Growing GNR, BCID detects E coli (no resistance). Currently on vancomycin, cefepime, flagyl. Source suspected to be urinary d/t dysuria and retention after placement of foley catheter. UA with gross hematuria and urine culture not sent.   Name of physician (or Provider) Contacted: Dr. Dwyane Dee  Current antibiotics: vancomycin + cefepime + metronidazole  Changes to prescribed antibiotics recommended: ceftriaxone 2 grams every 24 hours, collect urine culture, and renal ultrasound Recommendations accepted by provider   Results for orders placed or performed during the hospital encounter of 01/09/21  Blood Culture ID Panel (Reflexed) (Collected: 01/10/2021 12:09 AM)  Result Value Ref Range   Enterococcus faecalis NOT DETECTED NOT DETECTED   Enterococcus Faecium NOT DETECTED NOT DETECTED   Listeria monocytogenes NOT DETECTED NOT DETECTED   Staphylococcus species NOT DETECTED NOT DETECTED   Staphylococcus aureus (BCID) NOT DETECTED NOT DETECTED   Staphylococcus epidermidis NOT DETECTED NOT DETECTED   Staphylococcus lugdunensis NOT DETECTED NOT DETECTED   Streptococcus species NOT DETECTED NOT DETECTED   Streptococcus agalactiae NOT DETECTED NOT DETECTED   Streptococcus pneumoniae NOT DETECTED NOT DETECTED   Streptococcus pyogenes NOT DETECTED NOT DETECTED   A.calcoaceticus-baumannii NOT DETECTED NOT DETECTED   Bacteroides fragilis NOT DETECTED NOT DETECTED   Enterobacterales DETECTED (A) NOT DETECTED   Enterobacter cloacae complex NOT DETECTED NOT DETECTED   Escherichia coli DETECTED (A) NOT DETECTED   Klebsiella aerogenes NOT DETECTED NOT DETECTED   Klebsiella oxytoca NOT DETECTED NOT DETECTED   Klebsiella pneumoniae NOT DETECTED NOT  DETECTED   Proteus species NOT DETECTED NOT DETECTED   Salmonella species NOT DETECTED NOT DETECTED   Serratia marcescens NOT DETECTED NOT DETECTED   Haemophilus influenzae NOT DETECTED NOT DETECTED   Neisseria meningitidis NOT DETECTED NOT DETECTED   Pseudomonas aeruginosa NOT DETECTED NOT DETECTED   Stenotrophomonas maltophilia NOT DETECTED NOT DETECTED   Candida albicans NOT DETECTED NOT DETECTED   Candida auris NOT DETECTED NOT DETECTED   Candida glabrata NOT DETECTED NOT DETECTED   Candida krusei NOT DETECTED NOT DETECTED   Candida parapsilosis NOT DETECTED NOT DETECTED   Candida tropicalis NOT DETECTED NOT DETECTED   Cryptococcus neoformans/gattii NOT DETECTED NOT DETECTED   CTX-M ESBL NOT DETECTED NOT DETECTED   Carbapenem resistance IMP NOT DETECTED NOT DETECTED   Carbapenem resistance KPC NOT DETECTED NOT DETECTED   Carbapenem resistance NDM NOT DETECTED NOT DETECTED   Carbapenem resist OXA 48 LIKE NOT DETECTED NOT DETECTED   Carbapenem resistance VIM NOT DETECTED NOT DETECTED     Wynelle Cleveland, PharmD Pharmacy Resident  01/10/2021 2:14 PM

## 2021-01-10 NOTE — Progress Notes (Signed)
Pharmacy Antibiotic Note  Sandra Massey is a 70 y.o. female admitted on 01/09/2021 with infection form unknown source.  Pharmacy has been consulted for Cefepime and Vancomycin dosing x 7 days.  Plan: Cefepime 2 gm q12h per indication and renal fxn.  Vancomycin Pt given initial dose of  Vancomycin 2 gm.  Renal function slightly worsening. Will adjust dose to Vancomycin 1500 mg IV Q 48 hrs. Goal AUC 400-550. Expected AUC: 550.2 SCr used: 2.78, Vd used: 0.5, BMI 46.9  Pharmacy will continue to follow and adjust abx dosing when warranted.   Height: 5\' 7"  (170.2 cm) Weight: 136.1 kg (300 lb) IBW/kg (Calculated) : 61.6  Temp (24hrs), Avg:98.3 F (36.8 C), Min:98.3 F (36.8 C), Max:98.3 F (36.8 C)  Recent Labs  Lab 01/09/21 1417 01/09/21 1542 01/09/21 1735 01/10/21 0008 01/10/21 0009 01/10/21 0457  WBC 21.2*  --   --   --  33.3* 27.7*  CREATININE 2.25*  --   --   --  2.60* 2.78*  LATICACIDVEN  --  8.3* 7.1* 4.4*  --  4.1*     Estimated Creatinine Clearance: 27.9 mL/min (A) (by C-G formula based on SCr of 2.78 mg/dL (H)).    Allergies  Allergen Reactions   Latex Rash    Antimicrobials this admission: 11/2 Cefepime >> x 7 days 11/2 Vancomycin >> x 7 days 11/2 Flagyl >> x 7 days  Microbiology results: 11/2 BCx: Pending  Thank you for allowing pharmacy to be a part of this patient's care.  Pearla Dubonnet, PharmD Clinical Pharmacist 01/10/2021 11:20 AM

## 2021-01-10 NOTE — ED Notes (Signed)
Patient reports relief after catheter placed.  Patient appears in less discomfort.  Will continue to monitor

## 2021-01-10 NOTE — Progress Notes (Addendum)
Subjective: Continues to be agitated and confused.   Objective: Current vital signs: BP 129/75   Pulse (!) 108   Temp 98.3 F (36.8 C)   Resp (!) 23   Ht 5\' 7"  (1.702 m)   Wt 136.1 kg   SpO2 91%   BMI 46.99 kg/m  Vital signs in last 24 hours: Temp:  [98.3 F (36.8 C)] 98.3 F (36.8 C) (11/01 1351) Pulse Rate:  [55-108] 108 (11/02 1100) Resp:  [18-33] 23 (11/02 1100) BP: (82-129)/(42-94) 129/75 (11/02 1100) SpO2:  [90 %-100 %] 91 % (11/02 1100) Weight:  [136.1 kg] 136.1 kg (11/01 1352)  Intake/Output from previous day: 11/01 0701 - 11/02 0700 In: 3700 [IV Piggyback:3700] Out: -  Intake/Output this shift: No intake/output data recorded. Nutritional status:  Diet Order             Diet Carb Modified Fluid consistency: Thin; Room service appropriate? Yes  Diet effective now                  HEENT: [place]/AT Lungs: Respirations unlabored  Neurologic Exam: Ment: Awake and agitated with pained affect and frequent exclamations of discomfort and pain. Perseverates "I'm hot" with a distressed affect during most of the exam. Not oriented to place, time or situation. Speech is sparse and perseverative but the short phrases uttered are fluent. Impaired comprehension. States "five" when asked to identify a glove. Follows some simple commands. No significant dysarthria.  CN: PERRL. Will fixate briefly on examiner and gaze to left and right. Mild right facial droop. Reacts to cool sensation bilaterally.  Motor: Right hemiparesis, upper and lower extremity. Moves LUE and LLE with normal strength Sensory: Reacts to touch and light noxious x 4.  Cerebellar: No gross ataxia to LUE.   Lab Results: Results for orders placed or performed during the hospital encounter of 01/09/21 (from the past 48 hour(s))  CBG monitoring, ED     Status: Abnormal   Collection Time: 01/09/21  2:11 PM  Result Value Ref Range   Glucose-Capillary 126 (H) 70 - 99 mg/dL    Comment: Glucose reference range  applies only to samples taken after fasting for at least 8 hours.  Resp Panel by RT-PCR (Flu A&B, Covid) Nasopharyngeal Swab     Status: None   Collection Time: 01/09/21  2:17 PM   Specimen: Nasopharyngeal Swab; Nasopharyngeal(NP) swabs in vial transport medium  Result Value Ref Range   SARS Coronavirus 2 by RT PCR NEGATIVE NEGATIVE    Comment: (NOTE) SARS-CoV-2 target nucleic acids are NOT DETECTED.  The SARS-CoV-2 RNA is generally detectable in upper respiratory specimens during the acute phase of infection. The lowest concentration of SARS-CoV-2 viral copies this assay can detect is 138 copies/mL. A negative result does not preclude SARS-Cov-2 infection and should not be used as the sole basis for treatment or other patient management decisions. A negative result may occur with  improper specimen collection/handling, submission of specimen other than nasopharyngeal swab, presence of viral mutation(s) within the areas targeted by this assay, and inadequate number of viral copies(<138 copies/mL). A negative result must be combined with clinical observations, patient history, and epidemiological information. The expected result is Negative.  Fact Sheet for Patients:  EntrepreneurPulse.com.au  Fact Sheet for Healthcare Providers:  IncredibleEmployment.be  This test is no t yet approved or cleared by the [place] FDA and  has been authorized for detection and/or diagnosis of SARS-CoV-2 by FDA under an Emergency Use Authorization (EUA). This EUA will remain  in effect (meaning this test can be used) for the duration of the COVID-19 declaration under Section 564(b)(1) of the Act, 21 U.S.C.section 360bbb-3(b)(1), unless the authorization is terminated  or revoked sooner.       Influenza A by PCR NEGATIVE NEGATIVE   Influenza B by PCR NEGATIVE NEGATIVE    Comment: (NOTE) The Xpert Xpress SARS-CoV-2/FLU/RSV plus assay is intended as an aid in  the diagnosis of influenza from Nasopharyngeal swab specimens and should not be used as a sole basis for treatment. Nasal washings and aspirates are unacceptable for Xpert Xpress SARS-CoV-2/FLU/RSV testing.  Fact Sheet for Patients: EntrepreneurPulse.com.au  Fact Sheet for Healthcare Providers: IncredibleEmployment.be  This test is not yet approved or cleared by the [place] FDA and has been authorized for detection and/or diagnosis of SARS-CoV-2 by FDA under an Emergency Use Authorization (EUA). This EUA will remain in effect (meaning this test can be used) for the duration of the COVID-19 declaration under Section 564(b)(1) of the Act, 21 U.S.C. section 360bbb-3(b)(1), unless the authorization is terminated or revoked.  Performed at [facility], [address] [place], [place] [address]   Ethanol     Status: None   Collection Time: 01/09/21  2:17 PM  Result Value Ref Range   Alcohol, Ethyl (B) <10 <10 mg/dL    Comment: (NOTE) Lowest detectable limit for serum alcohol is 10 mg/dL.  For medical purposes only. Performed at [facility], [address] [place], [place] [address]   Protime-INR     Status: Abnormal   Collection Time: 01/09/21  2:17 PM  Result Value Ref Range   Prothrombin Time 24.3 (H) 11.4 - 15.2 seconds   INR 2.2 (H) 0.8 - 1.2    Comment: (NOTE) INR goal varies based on device and disease states. Performed at [facility], [address] [place], [place] [address]   APTT     Status: None   Collection Time: 01/09/21  2:17 PM  Result Value Ref Range   aPTT 34 24 - 36 seconds    Comment: Performed at [facility], [address] [place], [place] [address]  CBC     Status: Abnormal   Collection Time: 01/09/21  2:17 PM  Result Value Ref Range   WBC 21.2 (H) 4.0 - 10.5 K/uL   RBC 4.27 3.87 - 5.11 MIL/uL   Hemoglobin 12.1 12.0 - 15.0 g/dL   HCT 38.3 36.0 - 46.0 %   MCV  89.7 80.0 - 100.0 fL   MCH 28.3 26.0 - 34.0 pg   MCHC 31.6 30.0 - 36.0 g/dL   RDW 16.1 (H) 11.5 - 15.5 %   Platelets 197 150 - 400 K/uL   nRBC 0.1 0.0 - 0.2 %    Comment: Performed at [facility], [address] [place],  [address]  Differential     Status: Abnormal   Collection Time: 01/09/21  2:17 PM  Result Value Ref Range   Neutrophils Relative % 80 %   Neutro Abs 19.1 (H) 1.7 - 7.7 K/uL   Band Neutrophils 10 %   Lymphocytes Relative 4 %   Lymphs Abs 0.8 0.7 - 4.0 K/uL   Monocytes Relative 6 %   Monocytes Absolute 1.3 (H) 0.1 - 1.0 K/uL   Eosinophils Relative 0 %   Eosinophils Absolute 0.0 0.0 - 0.5 K/uL   Basophils Relative 0 %   Basophils Absolute 0.0 0.0 - 0.1 K/uL   WBC Morphology MORPHOLOGY UNREMARKABLE  RBC Morphology MORPHOLOGY UNREMARKABLE    Smear Review Normal platelet morphology    Abs Immature Granulocytes 0.00 0.00 - 0.07 K/uL    Comment: Performed at [facility], [address] [place], [place] [address]  Comprehensive metabolic panel     Status: Abnormal   Collection Time: 01/09/21  2:17 PM  Result Value Ref Range   Sodium 140 135 - 145 mmol/L   Potassium 3.5 3.5 - 5.1 mmol/L   Chloride 105 98 - 111 mmol/L   CO2 20 (L) 22 - 32 mmol/L   Glucose, Bld 143 (H) 70 - 99 mg/dL    Comment: Glucose reference range applies only to samples taken after fasting for at least 8 hours.   BUN 36 (H) 8 - 23 mg/dL   Creatinine, Ser 2.25 (H) 0.44 - 1.00 mg/dL   Calcium 8.8 (L) 8.9 - 10.3 mg/dL   Total Protein 6.2 (L) 6.5 - 8.1 g/dL   Albumin 2.9 (L) 3.5 - 5.0 g/dL   AST 87 (H) 15 - 41 U/L   ALT 59 (H) 0 - 44 U/L   Alkaline Phosphatase 112 38 - 126 U/L   Total Bilirubin 1.0 0.3 - 1.2 mg/dL   GFR, Estimated 23 (L) >60 mL/min    Comment: (NOTE) Calculated using the CKD-EPI Creatinine Equation (2021)    Anion gap 15 5 - 15    Comment: Performed at [facility], [address], [place], [place] [address]  Urine Drug Screen,  Qualitative     Status: None   Collection Time: 01/09/21  2:17 PM  Result Value Ref Range   Tricyclic, Ur Screen NONE DETECTED NONE DETECTED   Amphetamines, Ur Screen NONE DETECTED NONE DETECTED   MDMA (Ecstasy)Ur Screen NONE DETECTED NONE DETECTED   Cocaine Metabolite,Ur [place] NONE DETECTED NONE DETECTED   Opiate, Ur Screen NONE DETECTED NONE DETECTED   Phencyclidine (PCP) Ur S NONE DETECTED NONE DETECTED   Cannabinoid 50 Ng, Ur [place] NONE DETECTED NONE DETECTED   Barbiturates, Ur Screen NONE DETECTED NONE DETECTED   Benzodiazepine, Ur Scrn NONE DETECTED NONE DETECTED   Methadone Scn, Ur NONE DETECTED NONE DETECTED    Comment: (NOTE) Tricyclics + metabolites, urine    Cutoff 1000 ng/mL Amphetamines + metabolites, urine  Cutoff 1000 ng/mL MDMA (Ecstasy), urine              Cutoff 500 ng/mL Cocaine Metabolite, urine          Cutoff 300 ng/mL Opiate + metabolites, urine        Cutoff 300 ng/mL Phencyclidine (PCP), urine         Cutoff 25 ng/mL Cannabinoid, urine                 Cutoff 50 ng/mL Barbiturates + metabolites, urine  Cutoff 200 ng/mL Benzodiazepine, urine              Cutoff 200 ng/mL Methadone, urine                   Cutoff 300 ng/mL  The urine drug screen provides only a preliminary, unconfirmed analytical test result and should not be used for non-medical purposes. Clinical consideration and professional judgment should be applied to any positive drug screen result due to possible interfering substances. A more specific alternate chemical method must be used in order to obtain a confirmed analytical result. Gas chromatography / mass spectrometry (GC/MS) is the preferred confirm atory method. Performed at [facility], [id number] [place], [place] [address]   Urinalysis, Routine w reflex microscopic     Status: Abnormal   Collection Time: 01/09/21  2:17 PM  Result Value Ref Range   Color, Urine BROWN (A) YELLOW   APPearance TURBID (A) CLEAR   Specific  Gravity, Urine 1.017 1.005 - 1.030   pH  5.0 - 8.0    TEST NOT REPORTED DUE TO COLOR INTERFERENCE OF URINE PIGMENT   Glucose, UA (A) NEGATIVE mg/dL    TEST NOT REPORTED DUE TO COLOR INTERFERENCE OF URINE PIGMENT   Hgb urine dipstick (A) NEGATIVE    TEST NOT REPORTED DUE TO COLOR INTERFERENCE OF URINE PIGMENT   Bilirubin Urine (A) NEGATIVE    TEST NOT REPORTED DUE TO COLOR INTERFERENCE OF URINE PIGMENT   Ketones, ur (A) NEGATIVE mg/dL    TEST NOT REPORTED DUE TO COLOR INTERFERENCE OF URINE PIGMENT   Protein, ur (A) NEGATIVE mg/dL    TEST NOT REPORTED DUE TO COLOR INTERFERENCE OF URINE PIGMENT   Nitrite (A) NEGATIVE    TEST NOT REPORTED DUE TO COLOR INTERFERENCE OF URINE PIGMENT   Leukocytes,Ua (A) NEGATIVE    TEST NOT REPORTED DUE TO COLOR INTERFERENCE OF URINE PIGMENT   RBC / HPF >50 (H) 0 - 5 RBC/hpf   WBC, UA >50 (H) 0 - 5 WBC/hpf   Bacteria, UA RARE (A) NONE SEEN   Squamous Epithelial / LPF NONE SEEN 0 - 5   WBC Clumps PRESENT     Comment: Performed at [facility], [address] [place], [place] [address]  Troponin I (High Sensitivity)     Status: Abnormal   Collection Time: 01/09/21  3:42 PM  Result Value Ref Range   Troponin I (High Sensitivity) 157 (HH) <18 ng/L    Comment: CRITICAL RESULT CALLED TO, READ BACK BY AND VERIFIED WITH JENNA BELCHER 01/09/21 1621 SJL (NOTE) Elevated high sensitivity troponin I (hsTnI) values and significant  changes across serial measurements may suggest ACS but many other  chronic and acute conditions are known to elevate hsTnI results.  Refer to the "Links" section for chest pain algorithms and additional  guidance. Performed at [facility], [address] [place], [place] [address]   Lactic acid, plasma     Status: Abnormal   Collection Time: 01/09/21  3:42 PM  Result Value Ref Range   Lactic Acid, Venous 8.3 (HH) 0.5 - 1.9 mmol/L    Comment: CRITICAL RESULT CALLED TO, READ BACK BY AND VERIFIED WITH JENNA  BELCHER 01/09/21 1621 SJL Performed at [facility], [place], [place] [address]   Procalcitonin - Baseline     Status: None   Collection Time: 01/09/21  3:42 PM  Result Value Ref Range   Procalcitonin 43.25 ng/mL    Comment:        Interpretation: PCT >= 10 ng/mL: Important systemic inflammatory response, almost exclusively due to severe bacterial sepsis or septic shock. (NOTE)       Sepsis PCT Algorithm           Lower Respiratory Tract                                      Infection PCT Algorithm    ----------------------------     ----------------------------         PCT < 0.25 ng/mL  PCT < 0.10 ng/mL          Strongly encourage             Strongly discourage   discontinuation of antibiotics    initiation of antibiotics    ----------------------------     -----------------------------       PCT 0.25 - 0.50 ng/mL            PCT 0.10 - 0.25 ng/mL               OR       >80% decrease in PCT            Discourage initiation of                                            antibiotics      Encourage discontinuation           of antibiotics    ----------------------------     -----------------------------         PCT >= 0.50 ng/mL              PCT 0.26 - 0.50 ng/mL                AND       <80% decrease in PCT             Encourage initiation of                                             antibiotics       Encourage continuation           of antibiotics    ----------------------------     -----------------------------        PCT >= 0.50 ng/mL                  PCT > 0.50 ng/mL               AND         increase in PCT                  Strongly encourage                                      initiation of antibiotics    Strongly encourage escalation           of antibiotics                                     -----------------------------                                           PCT <= 0.25 ng/mL                                                  OR                                        >  80% decrease in PCT                                      Discontinue / Do not initiate                                             antibiotics  Performed at [facility], [address] [place], [place] [address]   Lactic acid, plasma     Status: Abnormal   Collection Time: 01/09/21  5:35 PM  Result Value Ref Range   Lactic Acid, Venous 7.1 (HH) 0.5 - 1.9 mmol/L    Comment: CRITICAL VALUE NOTED. VALUE IS CONSISTENT WITH PREVIOUSLY REPORTED/CALLED VALUE SKL Performed at [facility], [address] [place], [place] [address]   Hemoglobin A1c     Status: Abnormal   Collection Time: 01/09/21  5:35 PM  Result Value Ref Range   Hgb A1c MFr Bld 6.9 (H) 4.8 - 5.6 %    Comment: (NOTE) Pre diabetes:          5.7%-6.4%  Diabetes:              >6.4%  Glycemic control for   <7.0% adults with diabetes    Mean Plasma Glucose 151.33 mg/dL    Comment: Performed at [facility] [address], [place], [place] [address]  Troponin I (High Sensitivity)     Status: Abnormal   Collection Time: 01/09/21  5:35 PM  Result Value Ref Range   Troponin I (High Sensitivity) 147 (HH) <18 ng/L    Comment: CRITICAL VALUE NOTED. VALUE IS CONSISTENT WITH PREVIOUSLY REPORTED/CALLED VALUE SKL (NOTE) Elevated high sensitivity troponin I (hsTnI) values and significant  changes across serial measurements may suggest ACS but many other  chronic and acute conditions are known to elevate hsTnI results.  Refer to the "Links" section for chest pain algorithms and additional  guidance. Performed at [facility], [address] [place], [place] [address]   CBG monitoring, ED     Status: None   Collection Time: 01/09/21 10:09 PM  Result Value Ref Range   Glucose-Capillary 70 70 - 99 mg/dL    Comment: Glucose reference range applies only to samples taken after fasting for at least 8 hours.  Lactic acid, plasma     Status: Abnormal    Collection Time: 01/10/21 12:08 AM  Result Value Ref Range   Lactic Acid, Venous 4.4 (HH) 0.5 - 1.9 mmol/L    Comment: CRITICAL VALUE NOTED. VALUE IS CONSISTENT WITH PREVIOUSLY REPORTED/CALLED VALUE SKL Performed at [facility], [address] [place], [place] [address]   Culture, blood (Routine X 2) w Reflex to ID Panel     Status: None (Preliminary result)   Collection Time: 01/10/21 12:09 AM   Specimen: BLOOD  Result Value Ref Range   Specimen Description BLOOD RIGHT FOREARM    Special Requests      BOTTLES DRAWN AEROBIC AND ANAEROBIC Blood Culture results may not be optimal due to an excessive volume of blood received in culture bottles   Culture  Setup Time      ANAEROBIC BOTTLE ONLY GRAM NEGATIVE RODS AEROBIC BOTTLE ONLY CRITICAL VALUE NOTED.  VALUE IS CONSISTENT WITH PREVIOUSLY REPORTED AND CALLED VALUE. Performed at [facility], [place], [place] [address]    Culture GRAM NEGATIVE RODS    Report Status PENDING   Culture, blood (Routine X 2) w Reflex to ID Panel     Status: None (Preliminary result)   Collection Time: 01/10/21 12:09 AM   Specimen: BLOOD  Result Value Ref Range   Specimen Description BLOOD LEFT ASSIST CONTROL    Special Requests      BOTTLES DRAWN AEROBIC AND ANAEROBIC Blood Culture adequate volume   Culture  Setup Time      GRAM NEGATIVE RODS IN BOTH AEROBIC AND ANAEROBIC BOTTLES Organism ID to follow CRITICAL RESULT CALLED TO, READ BACK BY AND VERIFIED WITH: Performed at [facility], [address] [place], [place] [address]    Culture GRAM NEGATIVE RODS    Report Status PENDING   CBC with Differential     Status: Abnormal   Collection Time: 01/10/21 12:09 AM  Result Value Ref Range   WBC 33.3 (H) 4.0 - 10.5 K/uL   RBC 3.87 3.87 - 5.11 MIL/uL   Hemoglobin 11.0 (L) 12.0 - 15.0 g/dL   HCT 34.6 (L) 36.0 - 46.0 %   MCV 89.4 80.0 - 100.0 fL   MCH 28.4 26.0 - 34.0 pg   MCHC 31.8 30.0 - 36.0 g/dL    RDW 16.6 (H) 11.5 - 15.5 %   Platelets 179 150 - 400 K/uL   nRBC 0.1 0.0 - 0.2 %   Neutrophils Relative % 86 %   Neutro Abs 28.9 (H) 1.7 - 7.7 K/uL   Lymphocytes Relative 4 %   Lymphs Abs 1.3 0.7 - 4.0 K/uL   Monocytes Relative 4 %   Monocytes Absolute 1.2 (H) 0.1 - 1.0 K/uL   Eosinophils Relative 0 %   Eosinophils Absolute 0.0 0.0 - 0.5 K/uL   Basophils Relative 0 %   Basophils Absolute 0.1 0.0 - 0.1 K/uL   WBC Morphology      MODERATE LEFT SHIFT (>5% METAS AND MYELOS,OCC PRO NOTED)   RBC Morphology MORPHOLOGY UNREMARKABLE    Smear Review Normal platelet morphology    Immature Granulocytes 6 %   Abs Immature Granulocytes 1.89 (H) 0.00 - 0.07 K/uL    Comment: Performed at [facility], [address] [place], [place] [address]  Comprehensive metabolic panel     Status: Abnormal   Collection Time: 01/10/21 12:09 AM  Result Value Ref Range   Sodium 138 135 - 145 mmol/L   Potassium 4.2 3.5 - 5.1 mmol/L   Chloride 104 98 - 111 mmol/L   CO2 22 22 - 32 mmol/L   Glucose, Bld 80 70 - 99 mg/dL    Comment: Glucose reference range applies only to samples taken after fasting for at least 8 hours.   BUN 42 (H) 8 - 23 mg/dL   Creatinine, Ser 2.60 (H) 0.44 - 1.00 mg/dL   Calcium 8.4 (L) 8.9 - 10.3 mg/dL   Total Protein 5.8 (L) 6.5 - 8.1 g/dL   Albumin 2.7 (L) 3.5 - 5.0 g/dL   AST 181 (H) 15 - 41 U/L    Comment: RESULT CONFIRMED BY MANUAL DILUTION SKL   ALT 189 (H) 0 - 44 U/L   Alkaline Phosphatase 62 38 - 126 U/L   Total Bilirubin 1.0 0.3 - 1.2 mg/dL   GFR, Estimated 19 (L) >60 mL/min    Comment: (NOTE) Calculated using the CKD-EPI Creatinine Equation (2021)    Anion gap 12 5 - 15  Comment: Performed at [facility], [address] [place], [place] [address]  Protime-INR     Status: Abnormal   Collection Time: 01/10/21 12:09 AM  Result Value Ref Range   Prothrombin Time 29.6 (H) 11.4 - 15.2 seconds   INR 2.8 (H) 0.8 - 1.2    Comment: (NOTE) INR goal varies  based on device and disease states. Performed at [facility], [address] [place], [place] [address]   APTT     Status: Abnormal   Collection Time: 01/10/21 12:09 AM  Result Value Ref Range   aPTT 51 (H) 24 - 36 seconds    Comment:        IF BASELINE aPTT IS ELEVATED, SUGGEST PATIENT RISK ASSESSMENT BE USED TO DETERMINE APPROPRIATE ANTICOAGULANT THERAPY. Performed at [facility], [address] [place], [place] [address]   CBG monitoring, ED     Status: None   Collection Time: 01/10/21 12:46 AM  Result Value Ref Range   Glucose-Capillary 71 70 - 99 mg/dL    Comment: Glucose reference range applies only to samples taken after fasting for at least 8 hours.  Procalcitonin     Status: None   Collection Time: 01/10/21  4:57 AM  Result Value Ref Range   Procalcitonin 28.66 ng/mL    Comment:        Interpretation: PCT >= 10 ng/mL: Important systemic inflammatory response, almost exclusively due to severe bacterial sepsis or septic shock. (NOTE)       Sepsis PCT Algorithm           Lower Respiratory Tract                                      Infection PCT Algorithm    ----------------------------     ----------------------------         PCT < 0.25 ng/mL                PCT < 0.10 ng/mL          Strongly encourage             Strongly discourage   discontinuation of antibiotics    initiation of antibiotics    ----------------------------     -----------------------------       PCT 0.25 - 0.50 ng/mL            PCT 0.10 - 0.25 ng/mL               OR       >80% decrease in PCT            Discourage initiation of                                            antibiotics      Encourage discontinuation           of antibiotics    ----------------------------     -----------------------------         PCT >= 0.50 ng/mL              PCT 0.26 - 0.50 ng/mL                AND       <80% decrease in PCT  Encourage initiation of                                              antibiotics       Encourage continuation           of antibiotics    ----------------------------     -----------------------------        PCT >= 0.50 ng/mL                  PCT > 0.50 ng/mL               AND         increase in PCT                  Strongly encourage                                      initiation of antibiotics    Strongly encourage escalation           of antibiotics                                     -----------------------------                                           PCT <= 0.25 ng/mL                                                 OR                                        > 80% decrease in PCT                                      Discontinue / Do not initiate                                             antibiotics  Performed at [facility], [address], [place], [place] [address]   Basic metabolic panel     Status: Abnormal   Collection Time: 01/10/21  4:57 AM  Result Value Ref Range   Sodium 138 135 - 145 mmol/L   Potassium 4.5 3.5 - 5.1 mmol/L   Chloride 106 98 - 111 mmol/L   CO2 20 (L) 22 - 32 mmol/L   Glucose, Bld 68 (L) 70 - 99 mg/dL    Comment: Glucose reference range applies only to samples taken after fasting for at least 8 hours.   BUN 42 (H) 8 - 23 mg/dL   Creatinine, Ser 2.78 (H) 0.44 - 1.00 mg/dL   Calcium 8.2 (L) 8.9 -  10.3 mg/dL   GFR, Estimated 18 (L) >60 mL/min    Comment: (NOTE) Calculated using the CKD-EPI Creatinine Equation (2021)    Anion gap 12 5 - 15    Comment: Performed at [facility], [address] [place], [place] [address]  CBC     Status: Abnormal   Collection Time: 01/10/21  4:57 AM  Result Value Ref Range   WBC 27.7 (H) 4.0 - 10.5 K/uL   RBC 3.61 (L) 3.87 - 5.11 MIL/uL   Hemoglobin 10.6 (L) 12.0 - 15.0 g/dL   HCT 32.9 (L) 36.0 - 46.0 %   MCV 91.1 80.0 - 100.0 fL   MCH 29.4 26.0 - 34.0 pg   MCHC 32.2 30.0 - 36.0 g/dL   RDW 16.7 (H) 11.5 - 15.5 %   Platelets 156 150 -  400 K/uL   nRBC 0.0 0.0 - 0.2 %    Comment: Performed at [facility], [address], [place], [place] [address]  Magnesium     Status: Abnormal   Collection Time: 01/10/21  4:57 AM  Result Value Ref Range   Magnesium 1.0 (L) 1.7 - 2.4 mg/dL    Comment: Performed at [facility], [address] [place], [place] [address]  Lactic acid, plasma     Status: Abnormal   Collection Time: 01/10/21  4:57 AM  Result Value Ref Range   Lactic Acid, Venous 4.1 (HH) 0.5 - 1.9 mmol/L    Comment: CRITICAL VALUE NOTED. VALUE IS CONSISTENT WITH PREVIOUSLY REPORTED/CALLED VALUE RH Performed at [facility], [address] [place], [place] [address]   CBG monitoring, ED     Status: Abnormal   Collection Time: 01/10/21  8:38 AM  Result Value Ref Range   Glucose-Capillary 45 (L) 70 - 99 mg/dL    Comment: Glucose reference range applies only to samples taken after fasting for at least 8 hours.  CBG monitoring, ED     Status: Abnormal   Collection Time: 01/10/21  9:31 AM  Result Value Ref Range   Glucose-Capillary 69 (L) 70 - 99 mg/dL    Comment: Glucose reference range applies only to samples taken after fasting for at least 8 hours.  CBG monitoring, ED     Status: Abnormal   Collection Time: 01/10/21  1:36 PM  Result Value Ref Range   Glucose-Capillary 123 (H) 70 - 99 mg/dL    Comment: Glucose reference range applies only to samples taken after fasting for at least 8 hours.    Recent Results (from the past 240 hour(s))  Resp Panel by RT-PCR (Flu A&B, Covid) Nasopharyngeal Swab     Status: None   Collection Time: 01/09/21  2:17 PM   Specimen: Nasopharyngeal Swab; Nasopharyngeal(NP) swabs in vial transport medium  Result Value Ref Range Status   SARS Coronavirus 2 by RT PCR NEGATIVE NEGATIVE Final    Comment: (NOTE) SARS-CoV-2 target nucleic acids are NOT DETECTED.  The SARS-CoV-2 RNA is generally detectable in upper respiratory specimens during the acute phase of  infection. The lowest concentration of SARS-CoV-2 viral copies this assay can detect is 138 copies/mL. A negative result does not preclude SARS-Cov-2 infection and should not be used as the sole basis for treatment or other patient management decisions. A negative result may occur with  improper specimen collection/handling, submission of specimen other than nasopharyngeal swab, presence of viral mutation(s) within the areas targeted by this assay, and inadequate number of viral copies(<138 copies/mL). A negative result must  be combined with clinical observations, patient history, and epidemiological information. The expected result is Negative.  Fact Sheet for Patients:  EntrepreneurPulse.com.au  Fact Sheet for Healthcare Providers:  IncredibleEmployment.be  This test is no t yet approved or cleared by the [place] FDA and  has been authorized for detection and/or diagnosis of SARS-CoV-2 by FDA under an Emergency Use Authorization (EUA). This EUA will remain  in effect (meaning this test can be used) for the duration of the COVID-19 declaration under Section 564(b)(1) of the Act, 21 U.S.C.section 360bbb-3(b)(1), unless the authorization is terminated  or revoked sooner.       Influenza A by PCR NEGATIVE NEGATIVE Final   Influenza B by PCR NEGATIVE NEGATIVE Final    Comment: (NOTE) The Xpert Xpress SARS-CoV-2/FLU/RSV plus assay is intended as an aid in the diagnosis of influenza from Nasopharyngeal swab specimens and should not be used as a sole basis for treatment. Nasal washings and aspirates are unacceptable for Xpert Xpress SARS-CoV-2/FLU/RSV testing.  Fact Sheet for Patients: EntrepreneurPulse.com.au  Fact Sheet for Healthcare Providers: IncredibleEmployment.be  This test is not yet approved or cleared by the [place] FDA and has been authorized for detection and/or diagnosis of SARS-CoV-2  by FDA under an Emergency Use Authorization (EUA). This EUA will remain in effect (meaning this test can be used) for the duration of the COVID-19 declaration under Section 564(b)(1) of the Act, 21 U.S.C. section 360bbb-3(b)(1), unless the authorization is terminated or revoked.  Performed at [facility], [address] [place], [place] [address]   Culture, blood (Routine X 2) w Reflex to ID Panel     Status: None (Preliminary result)   Collection Time: 01/10/21 12:09 AM   Specimen: BLOOD  Result Value Ref Range Status   Specimen Description BLOOD RIGHT FOREARM  Final   Special Requests   Final    BOTTLES DRAWN AEROBIC AND ANAEROBIC Blood Culture results may not be optimal due to an excessive volume of blood received in culture bottles   Culture  Setup Time   Final    ANAEROBIC BOTTLE ONLY GRAM NEGATIVE RODS AEROBIC BOTTLE ONLY CRITICAL VALUE NOTED.  VALUE IS CONSISTENT WITH PREVIOUSLY REPORTED AND CALLED VALUE. Performed at [facility], [address] [place], [place] [address]    Culture GRAM NEGATIVE RODS  Final   Report Status PENDING  Incomplete  Culture, blood (Routine X 2) w Reflex to ID Panel     Status: None (Preliminary result)   Collection Time: 01/10/21 12:09 AM   Specimen: BLOOD  Result Value Ref Range Status   Specimen Description BLOOD LEFT ASSIST CONTROL  Final   Special Requests   Final    BOTTLES DRAWN AEROBIC AND ANAEROBIC Blood Culture adequate volume   Culture  Setup Time   Final    GRAM NEGATIVE RODS IN BOTH AEROBIC AND ANAEROBIC BOTTLES Organism ID to follow CRITICAL RESULT CALLED TO, READ BACK BY AND VERIFIED WITH: Performed at [facility], [address], [place], [place] [address]    Culture GRAM NEGATIVE RODS  Final   Report Status PENDING  Incomplete    Lipid Panel No results for input(s): CHOL, TRIG, HDL, CHOLHDL, VLDL, LDLCALC in the last 72 hours.  Studies/Results: DG Abd 1 View  Result Date:  01/09/2021 CLINICAL DATA:  MRI clearance. EXAM: ABDOMEN - 1 VIEW COMPARISON:  05/31/2019 FINDINGS: 16 mm stone projects over the lower pole of the right kidney. Prior cholecystectomy. Prominent small bowel loops in the  left abdomen. Gas within nondistended large bowel. No organomegaly or free air. IMPRESSION: Cholecystectomy clips in the right upper quadrant. No other radiopaque foreign body. Right lower pole nephrolithiasis. Electronically Signed   By: Rolm Baptise M.D.   On: 01/09/2021 19:30   MR BRAIN WO CONTRAST  Result Date: 01/09/2021 CLINICAL DATA:  Acute neurologic deficit EXAM: MRI HEAD WITHOUT CONTRAST TECHNIQUE: Multiplanar, multiecho pulse sequences of the brain and surrounding structures were obtained without intravenous contrast. COMPARISON:  None. FINDINGS: Brain: No acute infarct, mass effect or extra-axial collection. 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 Small amount of associated chronic blood products. Moderate bilateral periventricular white matter hyperintensity. Ex vacuo dilatation of the left lateral ventricle. Vascular: Major flow voids are preserved. Skull and upper cervical spine: Normal calvarium and skull base. Visualized upper cervical spine and soft tissues are normal. Sinuses/Orbits:No paranasal sinus fluid levels or advanced mucosal thickening. No mastoid or middle ear effusion. Normal orbits. IMPRESSION: 1. No acute intracranial abnormality. 2. 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 Electronically Signed   By: Ulyses Jarred M.D.   On: 01/09/2021 20:50   DG Chest Port 1 View  Result Date: 01/09/2021 CLINICAL DATA:  Shortness of breath.  Slurred speech. EXAM: PORTABLE CHEST 1 VIEW COMPARISON:  04/06/2020 FINDINGS: Stable mild cardiomegaly. Aortic atherosclerotic calcification noted. Both lungs are clear. IMPRESSION: Stable mild cardiomegaly. No active lung disease. Electronically Signed   By: Marlaine Hind M.D.   On: 01/09/2021 14:51    EEG adult  Result Date: 01/09/2021 Lora Havens, MD     01/09/2021  7:18 PM Patient Name: Sandra Massey MRN: [id number] Epilepsy Attending: Lora Havens Referring Physician/Provider: Dr Kerney Elbe Date: 01/09/2021 Duration: 20.56 mins Patient history: 70 year old female SNF resident with a history of atrial fibrillation, on Eliquis, who presents with acute onset of altered speech.  EEG to evaluate for seizure. Level of alertness: Awake, asleep AEDs during EEG study: None Technical aspects: This EEG study was done with scalp electrodes positioned according to the 10-20 International system of electrode placement. Electrical activity was acquired at a sampling rate of 500Hz  and reviewed with a high frequency filter of 70Hz  and a low frequency filter of 1Hz . EEG data were recorded continuously and digitally stored. Description: The posterior dominant rhythm consists of 8 Hz activity of moderate voltage (25-35 uV) seen predominantly in posterior head regions, asymmetric ( left<right) and reactive to eye opening and eye closing. Sleep was characterized by vertex waves, sleep spindles (12 to 14 Hz), maximal frontocentral region.  EEG showed continuous 3 to 6 Hz theta-delta slowing in left hemisphere, maximal left posterior quadrant. Spikes were also noted in left posterior temporal region. Physiologic photic driving was not seen during photic stimulation. Hyperventilation was not performed.   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 There is also cortical dysfunction arising from left hemisphere, maximal left posterior quadrant,  likely secondary to underlying structural abnormality, post-ictal state. No seizures were seen throughout the recording. Dr Cheral Marker was notified. Lora Havens   CT  HEAD CODE STROKE WO CONTRAST  Result Date: 01/09/2021 CLINICAL DATA:  Code stroke. EXAM: CT HEAD WITHOUT CONTRAST TECHNIQUE: Contiguous axial images were obtained from the base of the skull through the vertex without intravenous contrast. COMPARISON:  04/06/2020 FINDINGS: Brain: There is no acute intracranial hemorrhage,  mass effect, or edema. Chronic infarct left MCA territory with associated volume loss. No new loss of gray-white differentiation. Additional areas low-density in the supratentorial white matter are nonspecific but probably reflect stable chronic microvascular ischemic changes. Vascular: No hyperdense vessel. There is intracranial atherosclerotic calcification at the skull base. Skull: Unremarkable. Sinuses/Orbits: Aerated.  Orbits are unremarkable. Other: Mastoid air cells are clear. ASPECTS ([place] Stroke Program Early CT Score) - Ganglionic level infarction (caudate, lentiform nuclei, internal capsule, insula, M1-M3 cortex): 7 - Supraganglionic infarction (M4-M6 cortex): 3 Total score (0-10 with 10 being normal): 10 IMPRESSION: There is no acute intracranial hemorrhage or evidence of acute infarction. ASPECT score is 10. 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 These results were communicated to Dr. Cheral Marker at 2:08 pm on 01/09/2021 by text page via the [facility] messaging system. Electronically Signed   By: Macy Mis M.D.   On: 01/09/2021 14:14    Medications: Scheduled:  amiodarone  200 mg Oral BID   enoxaparin (LOVENOX) injection  0.5 mg/kg Subcutaneous Q24H   insulin aspart  0-15 Units Subcutaneous TID WC   rosuvastatin  40 mg Oral QPM   Continuous:  sodium chloride 50 mL/hr at 01/10/21 0239   ceFEPime (MAXIPIME) IV     levETIRAcetam Stopped (01/10/21 0856)   metronidazole Stopped (01/10/21 1041)   [START ON 01/11/2021] vancomycin     Assessment: 71 year old female SNF resident with a history of atrial fibrillation, on  Eliquis, who presents with acute onset of altered speech. LKN 1030.  1. Exam reveals agitation and pained affect with frequent exclamations and findings most consistent with her chronic right sided weakness. Unable to further delineate deficits due to agitation.  2. MRI brain: No acute intracranial abnormality. 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 Of note, the old MCA infarction appears large enough to most likely be associated with a chronic dysphasia.  3. DDx for presentation includes new onset subclinical or unwitnessed seizure with postictal state, versus severe pain precipitating a delirium, versus toxic/metabolic encephalopathy.  4. EEG yesterday (Tuesday) showed evidence of epileptogenicity arising from the left posterior temporal region. There is also cortical dysfunction arising from left hemisphere, maximal left posterior quadrant,  likely secondary to the underlying chronic left MCA stroke. No seizures were seen throughout the recording. 5. Has poor renal function. LFTs also mildly elevated. Started renally dosed Keppra yesterday (Tuesday) with 1000 mg IV load followed by 500 mg IV BID     Recommendations: 1. Repeat EEG completed with report pending 2. Frequent neuro checks  Addendum: EEG: This EEG was obtained while awake and asleep and is abnormal due to: - Mild diffuse slowing, indicative of global cerebral dysfunction - Left temporoparietal slowing indicative of superimposed focal cerebral dysfunction in that area - Sharp waves located over the left temporoparietal region indicating increased epileptogenicity in that area  There were no electrographic seizures observed during this recording   LOS: 1 day   @Electronically  signed: Dr. Kerney Elbe 01/10/2021  1:38 PM

## 2021-01-10 NOTE — Progress Notes (Signed)
Eeg done 

## 2021-01-10 NOTE — Sepsis Progress Note (Signed)
Notified bedside nurse of need to administer antibiotics and fluid bolus.  

## 2021-01-10 NOTE — ED Notes (Signed)
Patient sleeping comfortably after catheter placed.  VSS

## 2021-01-11 DIAGNOSIS — R7881 Bacteremia: Secondary | ICD-10-CM | POA: Diagnosis not present

## 2021-01-11 DIAGNOSIS — B9620 Unspecified Escherichia coli [E. coli] as the cause of diseases classified elsewhere: Secondary | ICD-10-CM | POA: Diagnosis not present

## 2021-01-11 DIAGNOSIS — N12 Tubulo-interstitial nephritis, not specified as acute or chronic: Secondary | ICD-10-CM

## 2021-01-11 DIAGNOSIS — A419 Sepsis, unspecified organism: Secondary | ICD-10-CM | POA: Diagnosis not present

## 2021-01-11 LAB — BASIC METABOLIC PANEL
Anion gap: 10 (ref 5–15)
BUN: 56 mg/dL — ABNORMAL HIGH (ref 8–23)
CO2: 20 mmol/L — ABNORMAL LOW (ref 22–32)
Calcium: 7.5 mg/dL — ABNORMAL LOW (ref 8.9–10.3)
Chloride: 108 mmol/L (ref 98–111)
Creatinine, Ser: 3.29 mg/dL — ABNORMAL HIGH (ref 0.44–1.00)
GFR, Estimated: 15 mL/min — ABNORMAL LOW (ref >60)
Glucose, Bld: 103 mg/dL — ABNORMAL HIGH (ref 70–99)
Potassium: 4.6 mmol/L (ref 3.5–5.1)
Sodium: 138 mmol/L (ref 135–145)

## 2021-01-11 LAB — CBC
HCT: 33.2 % — ABNORMAL LOW (ref 36.0–46.0)
Hemoglobin: 10.1 g/dL — ABNORMAL LOW (ref 12.0–15.0)
MCH: 28.1 pg (ref 26.0–34.0)
MCHC: 30.4 g/dL (ref 30.0–36.0)
MCV: 92.2 fL (ref 80.0–100.0)
Platelets: 127 10*3/uL — ABNORMAL LOW (ref 150–400)
RBC: 3.6 MIL/uL — ABNORMAL LOW (ref 3.87–5.11)
RDW: 17.4 % — ABNORMAL HIGH (ref 11.5–15.5)
WBC: 17 10*3/uL — ABNORMAL HIGH (ref 4.0–10.5)
nRBC: 0 % (ref 0.0–0.2)

## 2021-01-11 LAB — URINE CULTURE

## 2021-01-11 LAB — HEPATIC FUNCTION PANEL
ALT: 215 U/L — ABNORMAL HIGH (ref 0–44)
AST: 244 U/L — ABNORMAL HIGH (ref 15–41)
Albumin: 2.2 g/dL — ABNORMAL LOW (ref 3.5–5.0)
Alkaline Phosphatase: 90 U/L (ref 38–126)
Bilirubin, Direct: 0.2 mg/dL (ref 0.0–0.2)
Indirect Bilirubin: 0.7 mg/dL (ref 0.3–0.9)
Total Bilirubin: 0.9 mg/dL (ref 0.3–1.2)
Total Protein: 5.2 g/dL — ABNORMAL LOW (ref 6.5–8.1)

## 2021-01-11 LAB — GLUCOSE, CAPILLARY
Glucose-Capillary: 98 mg/dL (ref 70–99)
Glucose-Capillary: 108 mg/dL — ABNORMAL HIGH (ref 70–99)
Glucose-Capillary: 134 mg/dL — ABNORMAL HIGH (ref 70–99)

## 2021-01-11 LAB — MAGNESIUM: Magnesium: 1.5 mg/dL — ABNORMAL LOW (ref 1.7–2.4)

## 2021-01-11 LAB — LACTIC ACID, PLASMA: Lactic Acid, Venous: 1.4 mmol/L (ref 0.5–1.9)

## 2021-01-11 LAB — PROCALCITONIN: Procalcitonin: <0.1 ng/mL

## 2021-01-11 MED ORDER — ALPRAZOLAM 0.5 MG PO TABS
0.5000 mg | ORAL_TABLET | Freq: Two times a day (BID) | ORAL | Status: DC | PRN
  Administered 2021-01-11 – 2021-01-28 (×14): 0.5 mg via ORAL
  Filled 2021-01-11 (×16): qty 1

## 2021-01-11 MED ORDER — MAGNESIUM SULFATE 2 GM/50ML IV SOLN
2.0000 g | Freq: Once | INTRAVENOUS | Status: AC
  Administered 2021-01-11: 2 g via INTRAVENOUS
  Filled 2021-01-11: qty 50

## 2021-01-11 MED ORDER — CHLORHEXIDINE GLUCONATE CLOTH 2 % EX PADS
6.0000 | MEDICATED_PAD | Freq: Every day | CUTANEOUS | Status: DC
  Administered 2021-01-11 – 2021-01-29 (×19): 6 via TOPICAL

## 2021-01-11 NOTE — Progress Notes (Signed)
PROGRESS NOTE    Sandra Massey  IDP:[id number] DOB: 06/15/1950 DOA: 01/09/2021 PCP: [name], MD   Brief Narrative: This 70 yrs old  female who is Jehovah's Witness with PMH significant of old left MCA territory infarct, with right-sided hemiparesis, chronic A. fib, insulin-dependent diabetes mellitus, hypertension who was sent from SNF for a change in speech. Not able to obtain hx from pt due her speech difficulty and apparent cognitive deficits. CT Head : no acute findings. Neurology consulted recommended stroke work-up.  EEG showed evidence of epileptogenicity arising in the left posterior temporal region. Patient is started on Keppra 500 mg twice daily.  Neurology recommended repeat EEG and frequent neurochecks.   Patient became hypotensive requiring IV fluids.  Blood cultures grew E. Coli,  started on IV ceftriaxone.  Nephrology consulted for acute on chronic kidney disease.  Infectious diseases consulted for E. coli bacteremia to guide antibiotic regimen.  Assessment & Plan:   Active Problems:   Speech abnormality   Sepsis ([place])   Expressive aphasia: PMH : Hx of left MCA territory infarct, with right-sided hemiparesis: Patient was noted to have a change in her speech, She was also noted to have baseline difficulty speaking on her prior admission. Neurology consulted and initiated a stroke work-up. MRI brain: No acute intracranial abnormality.  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 EEG showed evidence of epileptogenicity arising in the left posterior temporal region. Neurology recommended to repeat EEG,  started on Keppra. Repeat EEG mild diffuse slowing indicative of global cerebral dysfunction, increased epileptogenicity in the left temporoparietal region  Severe sepsis /E. coli bacteremia: Patient presented with leukocytosis, hypotension, elevated lactic acid.  Blood cultures E. Coli+ WBC 21.2, procalcitonin 43.2, lactic acid 4.1. Hypotension and lactic  acidosis improved with IV hydration. Procalcitonin normalized.   Started on empiric cefepime and vancomycin. Antibiotic de-escalated to ceftriaxone. Infectious disease consulted, awaiting recommendation.  Acute kidney injury. Baseline creatinine 1.58, creatinine on admission 2.25 Suspect due to hypotension.  Creatinine up to 3.29 Avoid nephrotoxic medications, hypotension. Nephrology consulted, continue to monitor serum creatinine. Continue IV hydration.   Elevated troponin: 157 > 147 . Patient denies any chest pain. Patient has mildly elevated trop at baseline, ~100's.   Chronic A. fib on Eliquis Continue amiodarone Continue Eliquis.   Essential hypertension. Currently hypotensive Hold amlodipine.   Type 2 diabetes ACHS BG checks and SSI TID for now.  Hypomagnesemia: Replaced, continue to monitor.  Elevated INR Elevated liver enzymes Unclear etiology.  No history of cirrhosis Monitor for now.   Cognitive deficits Could be secondary to dementia.     DVT prophylaxis: SCDs Code Status: DNR Family Communication: No family at bed side. Disposition Plan:    Status is: Inpatient  Remains inpatient appropriate because: Severe sepsis, slurred speech.  Consultants:  Neurology.  Procedures: MRI/EEG Antimicrobials:   Anti-infectives (From admission, onward)    Start     Dose/Rate Route Frequency Ordered Stop   01/11/21 1300  vancomycin (VANCOREADY) IVPB 750 mg/150 mL  Status:  Discontinued        750 mg 150 mL/hr over 60 Minutes Intravenous Every 24 hours 01/10/21 0235 01/10/21 1116   01/11/21 0800  vancomycin (VANCOREADY) IVPB 1500 mg/300 mL  Status:  Discontinued        1,500 mg 150 mL/hr over 120 Minutes Intravenous Every 48 hours 01/10/21 1116 01/10/21 1448   01/11/21 0800  cefTRIAXone (ROCEPHIN) 2 g in sodium chloride 0.9 % 100 mL IVPB  2 g 200 mL/hr over 30 Minutes Intravenous Every 24 hours 01/10/21 1448     01/10/21 1200  ceFEPIme (MAXIPIME) 2 g  in sodium chloride 0.9 % 100 mL IVPB  Status:  Discontinued        2 g 200 mL/hr over 30 Minutes Intravenous Every 12 hours 01/10/21 0219 01/10/21 1448   01/09/21 2245  metroNIDAZOLE (FLAGYL) IVPB 500 mg  Status:  Discontinued        500 mg 100 mL/hr over 60 Minutes Intravenous 2 times daily 01/09/21 2228 01/10/21 1448   01/09/21 2245  vancomycin (VANCOREADY) IVPB 2000 mg/400 mL        2,000 mg 200 mL/hr over 120 Minutes Intravenous  Once 01/09/21 2234 01/10/21 0708   01/09/21 2230  ceFEPIme (MAXIPIME) 2 g in sodium chloride 0.9 % 100 mL IVPB        2 g 200 mL/hr over 30 Minutes Intravenous  Once 01/09/21 2228 01/10/21 0204   01/09/21 2230  vancomycin (VANCOCIN) IVPB 1000 mg/200 mL premix  Status:  Discontinued        1,000 mg 200 mL/hr over 60 Minutes Intravenous  Once 01/09/21 2228 01/09/21 2234       Subjective: Patient was seen and examined at bedside.  Overnight events noted.   Patient still appears confused, but following commands.  She complains of this dry mouth.    Objective: Vitals:   01/11/21 0031 01/11/21 0432 01/11/21 0753 01/11/21 1116  BP: (!) 92/56 95/65 102/61 109/70  Pulse: 91 83 93 (!) 102  Resp: 18 16 18 18   Temp: 98.5 F (36.9 C) (!) 97.5 F (36.4 C) 98.3 F (36.8 C) 98.2 F (36.8 C)  SpO2: 95% 95% 95% 98%  Weight:      Height:        Intake/Output Summary (Last 24 hours) at 01/11/2021 1436 Last data filed at 01/11/2021 1330 Gross per 24 hour  Intake 480 ml  Output 700 ml  Net -220 ml   Filed Weights   01/09/21 1352  Weight: 136.1 kg    Examination:  General exam: Appears  comfortable, not in any acute distress.  Deconditioned Respiratory system: Clear to auscultation bilaterally . Respiratory effort normal. RR 18 Cardiovascular system: S1-S2 heard, regular rate and rhythm, no murmur. Gastrointestinal system: Abdomen is soft, nontender, nondistended, BS + Central nervous system: Alert and oriented x 1 .  Garbling of speech Extremities: No  edema, no cyanosis, no clubbing. Skin: No rashes, lesions or ulcers Psychiatry: Judgement and insight appear normal. Mood & affect appropriate.     Data Reviewed: I have personally reviewed following labs and imaging studies  CBC: Recent Labs  Lab 01/09/21 1417 01/10/21 0009 01/10/21 0457 01/11/21 0606  WBC 21.2* 33.3* 27.7* 17.0*  NEUTROABS 19.1* 28.9*  --   --   HGB 12.1 11.0* 10.6* 10.1*  HCT 38.3 34.6* 32.9* 33.2*  MCV 89.7 89.4 91.1 92.2  PLT 197 179 156 696*   Basic Metabolic Panel: Recent Labs  Lab 01/09/21 1417 01/10/21 0009 01/10/21 0457 01/11/21 0606  NA 140 138 138 138  K 3.5 4.2 4.5 4.6  CL 105 104 106 108  CO2 20* 22 20* 20*  GLUCOSE 143* 80 68* 103*  BUN 36* 42* 42* 56*  CREATININE 2.25* 2.60* 2.78* 3.29*  CALCIUM 8.8* 8.4* 8.2* 7.5*  MG  --   --  1.0* 1.5*   GFR: Estimated Creatinine Clearance: 23.6 mL/min (A) (by C-G formula based on SCr of 3.29 mg/dL (  H)). Liver Function Tests: Recent Labs  Lab 01/09/21 1417 01/10/21 0009 01/11/21 0606  AST 87* 181* 244*  ALT 59* 189* 215*  ALKPHOS 112 62 90  BILITOT 1.0 1.0 0.9  PROT 6.2* 5.8* 5.2*  ALBUMIN 2.9* 2.7* 2.2*   No results for input(s): LIPASE, AMYLASE in the last 168 hours. No results for input(s): AMMONIA in the last 168 hours. Coagulation Profile: Recent Labs  Lab 01/09/21 1417 01/10/21 0009  INR 2.2* 2.8*   Cardiac Enzymes: No results for input(s): CKTOTAL, CKMB, CKMBINDEX, TROPONINI in the last 168 hours. BNP (last 3 results) No results for input(s): PROBNP in the last 8760 hours. HbA1C: Recent Labs    01/09/21 1735  HGBA1C 6.9*   CBG: Recent Labs  Lab 01/10/21 1336 01/10/21 1700 01/10/21 2256 01/11/21 0757 01/11/21 1117  GLUCAP 123* 123* 155* 98 108*   Lipid Profile: No results for input(s): CHOL, HDL, LDLCALC, TRIG, CHOLHDL, LDLDIRECT in the last 72 hours. Thyroid Function Tests: No results for input(s): TSH, T4TOTAL, FREET4, T3FREE, THYROIDAB in the last 72  hours. Anemia Panel: No results for input(s): VITAMINB12, FOLATE, FERRITIN, TIBC, IRON, RETICCTPCT in the last 72 hours. Sepsis Labs: Recent Labs  Lab 01/09/21 1542 01/09/21 1735 01/10/21 0008 01/10/21 0457 01/11/21 0606 01/11/21 0905  PROCALCITON 43.25  --   --  28.66 <0.10  --   LATICACIDVEN 8.3* 7.1* 4.4* 4.1*  --  1.4    Recent Results (from the past 240 hour(s))  Resp Panel by RT-PCR (Flu A&B, Covid) Nasopharyngeal Swab     Status: None   Collection Time: 01/09/21  2:17 PM   Specimen: Nasopharyngeal Swab; Nasopharyngeal(NP) swabs in vial transport medium  Result Value Ref Range Status   SARS Coronavirus 2 by RT PCR NEGATIVE NEGATIVE Final    Comment: (NOTE) SARS-CoV-2 target nucleic acids are NOT DETECTED.  The SARS-CoV-2 RNA is generally detectable in upper respiratory specimens during the acute phase of infection. The lowest concentration of SARS-CoV-2 viral copies this assay can detect is 138 copies/mL. A negative result does not preclude SARS-Cov-2 infection and should not be used as the sole basis for treatment or other patient management decisions. A negative result may occur with  improper specimen collection/handling, submission of specimen other than nasopharyngeal swab, presence of viral mutation(s) within the areas targeted by this assay, and inadequate number of viral copies(<138 copies/mL). A negative result must be combined with clinical observations, patient history, and epidemiological information. The expected result is Negative.  Fact Sheet for Patients:  EntrepreneurPulse.com.au  Fact Sheet for Healthcare Providers:  IncredibleEmployment.be  This test is no t yet approved or cleared by the [place] FDA and  has been authorized for detection and/or diagnosis of SARS-CoV-2 by FDA under an Emergency Use Authorization (EUA). This EUA will remain  in effect (meaning this test can be used) for the duration of  the COVID-19 declaration under Section 564(b)(1) of the Act, 21 U.S.C.section 360bbb-3(b)(1), unless the authorization is terminated  or revoked sooner.       Influenza A by PCR NEGATIVE NEGATIVE Final   Influenza B by PCR NEGATIVE NEGATIVE Final    Comment: (NOTE) The Xpert Xpress SARS-CoV-2/FLU/RSV plus assay is intended as an aid in the diagnosis of influenza from Nasopharyngeal swab specimens and should not be used as a sole basis for treatment. Nasal washings and aspirates are unacceptable for Xpert Xpress SARS-CoV-2/FLU/RSV testing.  Fact Sheet for Patients: EntrepreneurPulse.com.au  Fact Sheet for Healthcare Providers: IncredibleEmployment.be  This test is  not yet approved or cleared by the [place] and has been authorized for detection and/or diagnosis of SARS-CoV-2 by FDA under an Emergency Use Authorization (EUA). This EUA will remain in effect (meaning this test can be used) for the duration of the COVID-19 declaration under Section 564(b)(1) of the Act, 21 U.S.C. section 360bbb-3(b)(1), unless the authorization is terminated or revoked.  Performed at [facility], [address] [place], [place] [address]   Culture, blood (Routine X 2) w Reflex to ID Panel     Status: None (Preliminary result)   Collection Time: 01/10/21 12:09 AM   Specimen: BLOOD  Result Value Ref Range Status   Specimen Description   Final    BLOOD RIGHT FOREARM Performed at [facility], [address], [place], [place] [address]    Special Requests   Final    BOTTLES DRAWN AEROBIC AND ANAEROBIC Blood Culture results may not be optimal due to an excessive volume of blood received in culture bottles Performed at [facility], [address], [place], [place] [address]    Culture  Setup Time   Final    GRAM NEGATIVE RODS IN BOTH AEROBIC AND ANAEROBIC BOTTLES CRITICAL RESULT CALLED TO, READ BACK BY AND VERIFIED WITH:  PHARMD S HALLHAI [id number] AT 815 AM BY CM    Culture   Final    GRAM NEGATIVE RODS CULTURE REINCUBATED FOR BETTER GROWTH Performed at [facility], [place] [address], [place], [place] [address]    Report Status PENDING  Incomplete  Culture, blood (Routine X 2) w Reflex to ID Panel     Status: Abnormal (Preliminary result)   Collection Time: 01/10/21 12:09 AM   Specimen: BLOOD  Result Value Ref Range Status   Specimen Description   Final    BLOOD LEFT ANTECUBITAL Performed at [facility], [place] [address], [place], [place] [address]    Special Requests   Final    BOTTLES DRAWN AEROBIC AND ANAEROBIC Blood Culture adequate volume Performed at [place] [place], [place] [address]    Culture  Setup Time   Final    GRAM NEGATIVE RODS IN BOTH AEROBIC AND ANAEROBIC BOTTLES CRITICAL RESULT CALLED TO, READ BACK BY AND VERIFIED WITH: RAQUEL GOODMAN, PHARMD AT 1350 ON 01/10/21 BY GM    Culture (A)  Final    ESCHERICHIA COLI CULTURE REINCUBATED FOR BETTER GROWTH SUSCEPTIBILITIES TO FOLLOW Performed at [facility], [place] [address], [place],  [address]    Report Status PENDING  Incomplete  Blood Culture ID Panel (Reflexed)     Status: Abnormal   Collection Time: 01/10/21 12:09 AM  Result Value Ref Range Status   Enterococcus faecalis NOT DETECTED NOT DETECTED Final   Enterococcus Faecium NOT DETECTED NOT DETECTED Final   Listeria monocytogenes NOT DETECTED NOT DETECTED Final   Staphylococcus species NOT DETECTED NOT DETECTED Final   Staphylococcus aureus (BCID) NOT DETECTED NOT DETECTED Final   Staphylococcus epidermidis NOT DETECTED NOT DETECTED Final   Staphylococcus lugdunensis NOT DETECTED NOT DETECTED Final   Streptococcus species NOT DETECTED NOT DETECTED Final   Streptococcus agalactiae NOT DETECTED NOT DETECTED Final   Streptococcus pneumoniae NOT DETECTED NOT DETECTED Final   Streptococcus pyogenes NOT DETECTED NOT DETECTED Final    A.calcoaceticus-baumannii NOT DETECTED NOT DETECTED Final   Bacteroides fragilis NOT DETECTED NOT DETECTED Final   Enterobacterales DETECTED (A) NOT DETECTED Final    Comment: Enterobacterales represent a  large order of gram negative bacteria, not a single organism. CRITICAL RESULT CALLED TO, READ BACK BY AND VERIFIED WITH: [place], PHARMD AT 1350 ON 01/10/21 BY GM    Enterobacter cloacae complex NOT DETECTED NOT DETECTED Final   Escherichia coli DETECTED (A) NOT DETECTED Final    Comment: CRITICAL RESULT CALLED TO, READ BACK BY AND VERIFIED WITH: [place], PHARMD AT 1350 ON 01/10/21 BY GM    Klebsiella aerogenes NOT DETECTED NOT DETECTED Final   Klebsiella oxytoca NOT DETECTED NOT DETECTED Final   Klebsiella pneumoniae NOT DETECTED NOT DETECTED Final   Proteus species NOT DETECTED NOT DETECTED Final   Salmonella species NOT DETECTED NOT DETECTED Final   Serratia marcescens NOT DETECTED NOT DETECTED Final   Haemophilus influenzae NOT DETECTED NOT DETECTED Final   Neisseria meningitidis NOT DETECTED NOT DETECTED Final   Pseudomonas aeruginosa NOT DETECTED NOT DETECTED Final   Stenotrophomonas maltophilia NOT DETECTED NOT DETECTED Final   Candida albicans NOT DETECTED NOT DETECTED Final   Candida auris NOT DETECTED NOT DETECTED Final   Candida glabrata NOT DETECTED NOT DETECTED Final   Candida krusei NOT DETECTED NOT DETECTED Final   Candida parapsilosis NOT DETECTED NOT DETECTED Final   Candida tropicalis NOT DETECTED NOT DETECTED Final   Cryptococcus neoformans/gattii NOT DETECTED NOT DETECTED Final   CTX-M ESBL NOT DETECTED NOT DETECTED Final   Carbapenem resistance IMP NOT DETECTED NOT DETECTED Final   Carbapenem resistance KPC NOT DETECTED NOT DETECTED Final   Carbapenem resistance NDM NOT DETECTED NOT DETECTED Final   Carbapenem resist OXA 48 LIKE NOT DETECTED NOT DETECTED Final   Carbapenem resistance VIM NOT DETECTED NOT DETECTED Final    Comment: Performed at  [facility], [address], [place], [place] [address]    Radiology Studies: DG Abd 1 View  Result Date: 01/09/2021 CLINICAL DATA:  MRI clearance. EXAM: ABDOMEN - 1 VIEW COMPARISON:  05/31/2019 FINDINGS: 16 mm stone projects over the lower pole of the right kidney. Prior cholecystectomy. Prominent small bowel loops in the left abdomen. Gas within nondistended large bowel. No organomegaly or free air. IMPRESSION: Cholecystectomy clips in the right upper quadrant. No other radiopaque foreign body. Right lower pole nephrolithiasis. Electronically Signed   By: Rolm Baptise M.D.   On: 01/09/2021 19:30   MR BRAIN WO CONTRAST  Result Date: 01/09/2021 CLINICAL DATA:  Acute neurologic deficit EXAM: MRI HEAD WITHOUT CONTRAST TECHNIQUE: Multiplanar, multiecho pulse sequences of the brain and surrounding structures were obtained without intravenous contrast. COMPARISON:  None. FINDINGS: Brain: No acute infarct, mass effect or extra-axial collection. 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 Small amount of associated chronic blood products. Moderate bilateral periventricular white matter hyperintensity. Ex vacuo dilatation of the left lateral ventricle. Vascular: Major flow voids are preserved. Skull and upper cervical spine: Normal calvarium and skull base. Visualized upper cervical spine and soft tissues are normal. Sinuses/Orbits:No paranasal sinus fluid levels or advanced mucosal thickening. No mastoid or middle ear effusion. Normal orbits. IMPRESSION: 1. No acute intracranial abnormality. 2. 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 Electronically Signed   By: Ulyses Jarred M.D.   On: 01/09/2021 20:50   US RENAL  Result Date: 01/10/2021 CLINICAL DATA:  UTI. EXAM: RENAL / URINARY TRACT ULTRASOUND COMPLETE COMPARISON:  None. FINDINGS: Right Kidney: Renal measurements: 11.5 cm x 6.1 cm x 6.0 cm = volume: 222 mL. Diffusely increased echogenicity of the renal parenchyma  is noted. No mass or hydronephrosis visualized. Left Kidney:  Renal measurements: 11.1 cm x 6.0 cm x 4.9 cm = volume: 172 mL. Echogenicity within normal limits. A 2.0 cm x 1.6 cm x 1.6 cm anechoic structure is seen within the upper pole of the left kidney. No hydronephrosis is visualized. Bladder: Appears normal for degree of bladder distention. Other: The study is limited secondary to the patient's body habitus. IMPRESSION: 1. Diffusely increased echogenicity of the right kidney which may be secondary to a diffuse infectious or inflammatory process. Medical renal disease cannot be excluded. 2. Small left renal cyst. Electronically Signed   By: Virgina Norfolk M.D.   On: 01/10/2021 15:46   DG Chest Port 1 View  Result Date: 01/09/2021 CLINICAL DATA:  Shortness of breath.  Slurred speech. EXAM: PORTABLE CHEST 1 VIEW COMPARISON:  04/06/2020 FINDINGS: Stable mild cardiomegaly. Aortic atherosclerotic calcification noted. Both lungs are clear. IMPRESSION: Stable mild cardiomegaly. No active lung disease. Electronically Signed   By: Marlaine Hind M.D.   On: 01/09/2021 14:51   EEG adult  Result Date: 01/10/2021 [name], MD     01/10/2021  1:42 PM Routine EEG Report Sandra Massey is a 70 y.o. female with a history of spells who is undergoing an EEG to evaluate for seizures. Report: This EEG was acquired with electrodes placed according to the International 10-20 electrode system (including Fp1, Fp2, F3, F4, C3, C4, P3, P4, O1, O2, T3, T4, T5, T6, A1, A2, Fz, Cz, Pz). The following electrodes were missing or displaced: none. The occipital dominant rhythm was 6 Hz. This activity is reactive to stimulation. Drowsiness was manifested by background fragmentation; deeper stages of sleep were identified by K complexes and sleep spindles. There was focal slowing over the left temporoparietal region. There were sharp waves noted occasionally (at times, frequently) in the left temporoparietal region. There were no  electrographic seizures identified. Photic stimulation and hyperventilation were not performed. Impression and clinical correlation: This EEG was obtained while awake and asleep and is abnormal due to: - Mild diffuse slowing, indicative of global cerebral dysfunction - Left temporoparietal slowing indicative of superimposed focal cerebral dysfunction in that area - Sharp waves located over the left temporoparietal region indicating increased epileptogenicity in that area There were no electrographic seizures observed during this recording. [name], MD [facility] [phone] If 7pm- 7am, please page neurology on call as listed in [place].   EEG adult  Result Date: 01/09/2021 Lora Havens, MD     01/09/2021  7:18 PM Patient Name: Sandra Massey MRN: [id number] Epilepsy Attending: Lora Havens Referring Physician/Provider: Dr Kerney Elbe Date: 01/09/2021 Duration: 20.56 mins Patient history: 70 year old female SNF resident with a history of atrial fibrillation, on Eliquis, who presents with acute onset of altered speech.  EEG to evaluate for seizure. Level of alertness: Awake, asleep AEDs during EEG study: None Technical aspects: This EEG study was done with scalp electrodes positioned according to the 10-20 International system of electrode placement. Electrical activity was acquired at a sampling rate of 500Hz  and reviewed with a high frequency filter of 70Hz  and a low frequency filter of 1Hz . EEG data were recorded continuously and digitally stored. Description: The posterior dominant rhythm consists of 8 Hz activity of moderate voltage (25-35 uV) seen predominantly in posterior head regions, asymmetric ( left<right) and reactive to eye opening and eye closing. Sleep was characterized by vertex waves, sleep spindles (12 to 14 Hz), maximal frontocentral region.  EEG showed continuous 3 to 6 Hz theta-delta slowing in  left hemisphere, maximal left posterior quadrant. Spikes were also noted in  left posterior temporal region. Physiologic photic driving was not seen during photic stimulation. Hyperventilation was not performed.   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 There is also cortical dysfunction arising from left hemisphere, maximal left posterior quadrant,  likely secondary to underlying structural abnormality, post-ictal state. No seizures were seen throughout the recording. Dr Cheral Marker was notified. Priyanka [name]    Scheduled Meds:  amiodarone  200 mg Oral BID   Chlorhexidine Gluconate Cloth  6 each Topical Daily   enoxaparin (LOVENOX) injection  0.5 mg/kg Subcutaneous Q24H   insulin aspart  0-15 Units Subcutaneous TID WC   rosuvastatin  40 mg Oral QPM   Continuous Infusions:  sodium chloride 75 mL/hr at 01/10/21 1658   cefTRIAXone (ROCEPHIN)  IV 2 g (01/11/21 0828)   levETIRAcetam 500 mg (01/11/21 0908)     LOS: 2 days    Time spent: 25 mins    Maryan Sivak, MD [facility]   If 7PM-7AM, please contact night-coverage

## 2021-01-11 NOTE — Consult Note (Signed)
[facility] Consult Note: January 11, 2021    Date of Admission:  01/09/2021           Reason for Consult:  Acute kidney injury   Referring Provider: Shawna Clamp, MD Primary Care Provider: [name], MD   History of Presenting Illness:  Sandra Massey is a 70 y.o. 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 During neurological work-up, her EEG showed evidence of epilepsy.  Patient was started on Keppra.  During hospitalization patient became hypotensive requiring IV fluids.  Blood culture positive for E. coli.  Patient being treated for sepsis with IV ceftriaxone.  Hospital course is further complicated by acute kidney injury.  Baseline creatinine appears to be 1.58/GFR 35 from June 26, 2020.  Creatinine has progressively worsened from 2.25 at admission to 3.29 today.  Nephrology consult has now been requested for evaluation  Patient is not able to provide meaningful information due to aphasia  Review of Systems: ROS  See above  Past Medical History:  Diagnosis Date   Arthritis    Chronic atrial fibrillation ([place])    CKD (chronic kidney disease), stage III ([place])    Diabetes mellitus without complication ([place])    History of ischemic cerebrovascular accident (CVA) with residual deficit    Hypertension    Substance abuse ([place])     Social History   Tobacco Use   Smoking status: Never   Smokeless tobacco: Never   Tobacco comments:    2007 quit   Substance Use Topics   Alcohol use: Never   Drug use: Never    No family history on file.   OBJECTIVE: Blood pressure 102/61, pulse 93, temperature 98.3 F (36.8 C), resp. rate 18, height 5\' 7"  (1.702 m), weight 136.1 kg, SpO2 95 %.  Physical Exam  Physical Exam: General:  No acute distress, laying in the bed, moaning   HEENT  anicteric,  dry oral mucous membrane  Pulm/lungs  normal breathing effort, lungs are clear to auscultation  CVS/Heart  irregular rhythm, no rub or gallop  Abdomen:   Soft, nontender  Extremities:  No peripheral edema  Neurologic:  Alert, oriented, able to follow commands, right sided weakness  Skin:  No acute rashes     Lab Results Lab Results  Component Value Date   WBC 17.0 (H) 01/11/2021   HGB 10.1 (L) 01/11/2021   HCT 33.2 (L) 01/11/2021   MCV 92.2 01/11/2021   PLT 127 (L) 01/11/2021    Lab Results  Component Value Date   CREATININE 3.29 (H) 01/11/2021   BUN 56 (H) 01/11/2021   NA 138 01/11/2021   K 4.6 01/11/2021   CL 108 01/11/2021   CO2 20 (L) 01/11/2021    Lab Results  Component Value Date   ALT 189 (H) 01/10/2021   AST 181 (H) 01/10/2021   ALKPHOS 62 01/10/2021   BILITOT 1.0 01/10/2021     Microbiology: Recent Results (from the past 240 hour(s))  Resp Panel by RT-PCR (Flu A&B, Covid) Nasopharyngeal Swab     Status: None   Collection Time: 01/09/21  2:17 PM   Specimen: Nasopharyngeal Swab; Nasopharyngeal(NP) swabs in vial transport medium  Result Value Ref Range Status   SARS Coronavirus 2 by RT PCR NEGATIVE NEGATIVE Final    Comment: (NOTE) SARS-CoV-2 target nucleic acids are NOT DETECTED.  The SARS-CoV-2 RNA  is generally detectable in upper respiratory specimens during the acute phase of infection. The lowest concentration of SARS-CoV-2 viral copies this assay can detect is 138 copies/mL. A negative result does not preclude SARS-Cov-2 infection and should not be used as the sole basis for treatment or other patient management decisions. A negative result may occur with  improper specimen collection/handling, submission of specimen other than nasopharyngeal swab, presence of viral mutation(s) within the areas targeted by this assay, and inadequate number of viral copies(<138 copies/mL). A negative result must be combined with clinical  observations, patient history, and epidemiological information. The expected result is Negative.  Fact Sheet for Patients:  EntrepreneurPulse.com.au  Fact Sheet for Healthcare Providers:  IncredibleEmployment.be  This test is no t yet approved or cleared by the [place] FDA and  has been authorized for detection and/or diagnosis of SARS-CoV-2 by FDA under an Emergency Use Authorization (EUA). This EUA will remain  in effect (meaning this test can be used) for the duration of the COVID-19 declaration under Section 564(b)(1) of the Act, 21 U.S.C.section 360bbb-3(b)(1), unless the authorization is terminated  or revoked sooner.       Influenza A by PCR NEGATIVE NEGATIVE Final   Influenza B by PCR NEGATIVE NEGATIVE Final    Comment: (NOTE) The Xpert Xpress SARS-CoV-2/FLU/RSV plus assay is intended as an aid in the diagnosis of influenza from Nasopharyngeal swab specimens and should not be used as a sole basis for treatment. Nasal washings and aspirates are unacceptable for Xpert Xpress SARS-CoV-2/FLU/RSV testing.  Fact Sheet for Patients: EntrepreneurPulse.com.au  Fact Sheet for Healthcare Providers: IncredibleEmployment.be  This test is not yet approved or cleared by the [place] FDA and has been authorized for detection and/or diagnosis of SARS-CoV-2 by FDA under an Emergency Use Authorization (EUA). This EUA will remain in effect (meaning this test can be used) for the duration of the COVID-19 declaration under Section 564(b)(1) of the Act, 21 U.S.C. section 360bbb-3(b)(1), unless the authorization is terminated or revoked.  Performed at [facility], [address] [place], [place] [address]   Culture, blood (Routine X 2) w Reflex to ID Panel     Status: None (Preliminary result)   Collection Time: 01/10/21 12:09 AM   Specimen: BLOOD  Result Value Ref Range Status   Specimen  Description   Final    BLOOD RIGHT FOREARM Performed at [facility], [address], [place], [place] [address]    Special Requests   Final    BOTTLES DRAWN AEROBIC AND ANAEROBIC Blood Culture results may not be optimal due to an excessive volume of blood received in culture bottles Performed at [facility], [address] [place], [place] [address]    Culture  Setup Time   Final    GRAM NEGATIVE RODS IN BOTH AEROBIC AND ANAEROBIC BOTTLES CRITICAL RESULT CALLED TO, READ BACK BY AND VERIFIED WITH: PHARMD S HALLHAI [id number] AT 815 AM BY CM Performed at [facility], [place] [address] [place], [place] [address]    Culture GRAM NEGATIVE RODS  Final   Report Status PENDING  Incomplete  Culture, blood (Routine X 2) w Reflex to ID Panel     Status: None (Preliminary result)   Collection Time: 01/10/21 12:09 AM   Specimen: BLOOD  Result Value Ref Range Status   Specimen Description   Final    BLOOD LEFT ANTECUBITAL Performed at [facility], [place] [address] [place], [place] [address]  Special Requests   Final    BOTTLES DRAWN AEROBIC AND ANAEROBIC Blood Culture adequate volume Performed at [facility], [address] [place], [place] [address]    Culture  Setup Time   Final    GRAM NEGATIVE RODS IN BOTH AEROBIC AND ANAEROBIC BOTTLES CRITICAL RESULT CALLED TO, READ BACK BY AND VERIFIED WITH: [place], PHARMD AT 1350 ON 01/10/21 BY GM    Culture   Final    GRAM NEGATIVE RODS CULTURE REINCUBATED FOR BETTER GROWTH Performed at [facility], [place] [address], [place], [place] [address]    Report Status PENDING  Incomplete  Blood Culture ID Panel (Reflexed)     Status: Abnormal   Collection Time: 01/10/21 12:09 AM  Result Value Ref Range Status   Enterococcus faecalis NOT DETECTED NOT DETECTED Final   Enterococcus Faecium NOT DETECTED NOT DETECTED Final   Listeria monocytogenes NOT DETECTED NOT DETECTED Final   Staphylococcus  species NOT DETECTED NOT DETECTED Final   Staphylococcus aureus (BCID) NOT DETECTED NOT DETECTED Final   Staphylococcus epidermidis NOT DETECTED NOT DETECTED Final   Staphylococcus lugdunensis NOT DETECTED NOT DETECTED Final   Streptococcus species NOT DETECTED NOT DETECTED Final   Streptococcus agalactiae NOT DETECTED NOT DETECTED Final   Streptococcus pneumoniae NOT DETECTED NOT DETECTED Final   Streptococcus pyogenes NOT DETECTED NOT DETECTED Final   A.calcoaceticus-baumannii NOT DETECTED NOT DETECTED Final   Bacteroides fragilis NOT DETECTED NOT DETECTED Final   Enterobacterales DETECTED (A) NOT DETECTED Final    Comment: Enterobacterales represent a large order of gram negative bacteria, not a single organism. CRITICAL RESULT CALLED TO, READ BACK BY AND VERIFIED WITH: [place], PHARMD AT 1350 ON 01/10/21 BY GM    Enterobacter cloacae complex NOT DETECTED NOT DETECTED Final   Escherichia coli DETECTED (A) NOT DETECTED Final    Comment: CRITICAL RESULT CALLED TO, READ BACK BY AND VERIFIED WITH: [place], PHARMD AT 1350 ON 01/10/21 BY GM    Klebsiella aerogenes NOT DETECTED NOT DETECTED Final   Klebsiella oxytoca NOT DETECTED NOT DETECTED Final   Klebsiella pneumoniae NOT DETECTED NOT DETECTED Final   Proteus species NOT DETECTED NOT DETECTED Final   Salmonella species NOT DETECTED NOT DETECTED Final   Serratia marcescens NOT DETECTED NOT DETECTED Final   Haemophilus influenzae NOT DETECTED NOT DETECTED Final   Neisseria meningitidis NOT DETECTED NOT DETECTED Final   Pseudomonas aeruginosa NOT DETECTED NOT DETECTED Final   Stenotrophomonas maltophilia NOT DETECTED NOT DETECTED Final   Candida albicans NOT DETECTED NOT DETECTED Final   Candida auris NOT DETECTED NOT DETECTED Final   Candida glabrata NOT DETECTED NOT DETECTED Final   Candida krusei NOT DETECTED NOT DETECTED Final   Candida parapsilosis NOT DETECTED NOT DETECTED Final   Candida tropicalis NOT DETECTED NOT  DETECTED Final   Cryptococcus neoformans/gattii NOT DETECTED NOT DETECTED Final   CTX-M ESBL NOT DETECTED NOT DETECTED Final   Carbapenem resistance IMP NOT DETECTED NOT DETECTED Final   Carbapenem resistance KPC NOT DETECTED NOT DETECTED Final   Carbapenem resistance NDM NOT DETECTED NOT DETECTED Final   Carbapenem resist OXA 48 LIKE NOT DETECTED NOT DETECTED Final   Carbapenem resistance VIM NOT DETECTED NOT DETECTED Final    Comment: Performed at [facility], [address] [place], [place] [address]    Medications: Scheduled Meds:  amiodarone  200 mg Oral BID   enoxaparin (LOVENOX) injection  0.5 mg/kg Subcutaneous Q24H   insulin aspart  0-15  Units Subcutaneous TID WC   rosuvastatin  40 mg Oral QPM   Continuous Infusions:  sodium chloride 75 mL/hr at 01/10/21 1658   cefTRIAXone (ROCEPHIN)  IV 2 g (01/11/21 0828)   levETIRAcetam 500 mg (01/10/21 2240)   magnesium sulfate bolus IVPB     PRN Meds:.docusate sodium, LORazepam, ondansetron (ZOFRAN) IV, ondansetron, polyethylene glycol  Allergies  Allergen Reactions   Latex Rash    Urinalysis: Recent Labs    01/09/21 1417  COLORURINE [place]*  LABSPEC 1.017  PHURINE TEST NOT REPORTED DUE TO COLOR INTERFERENCE OF URINE PIGMENT  GLUCOSEU TEST NOT REPORTED DUE TO COLOR INTERFERENCE OF URINE PIGMENT*  HGBUR TEST NOT REPORTED DUE TO COLOR INTERFERENCE OF URINE PIGMENT*  BILIRUBINUR TEST NOT REPORTED DUE TO COLOR INTERFERENCE OF URINE PIGMENT*  KETONESUR TEST NOT REPORTED DUE TO COLOR INTERFERENCE OF URINE PIGMENT*  PROTEINUR TEST NOT REPORTED DUE TO COLOR INTERFERENCE OF URINE PIGMENT*  NITRITE TEST NOT REPORTED DUE TO COLOR INTERFERENCE OF URINE PIGMENT*  LEUKOCYTESUR TEST NOT REPORTED DUE TO COLOR INTERFERENCE OF URINE PIGMENT*      Imaging: DG Abd 1 View  Result Date: 01/09/2021 CLINICAL DATA:  MRI clearance. EXAM: ABDOMEN - 1 VIEW COMPARISON:  05/31/2019 FINDINGS: 16 mm stone projects over the lower pole of  the right kidney. Prior cholecystectomy. Prominent small bowel loops in the left abdomen. Gas within nondistended large bowel. No organomegaly or free air. IMPRESSION: Cholecystectomy clips in the right upper quadrant. No other radiopaque foreign body. Right lower pole nephrolithiasis. Electronically Signed   By: Rolm Baptise M.D.   On: 01/09/2021 19:30   MR BRAIN WO CONTRAST  Result Date: 01/09/2021 CLINICAL DATA:  Acute neurologic deficit EXAM: MRI HEAD WITHOUT CONTRAST TECHNIQUE: Multiplanar, multiecho pulse sequences of the brain and surrounding structures were obtained without intravenous contrast. COMPARISON:  None. FINDINGS: Brain: No acute infarct, mass effect or extra-axial collection. 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 Small amount of associated chronic blood products. Moderate bilateral periventricular white matter hyperintensity. Ex vacuo dilatation of the left lateral ventricle. Vascular: Major flow voids are preserved. Skull and upper cervical spine: Normal calvarium and skull base. Visualized upper cervical spine and soft tissues are normal. Sinuses/Orbits:No paranasal sinus fluid levels or advanced mucosal thickening. No mastoid or middle ear effusion. Normal orbits. IMPRESSION: 1. No acute intracranial abnormality. 2. 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 Electronically Signed   By: Ulyses Jarred M.D.   On: 01/09/2021 20:50   US RENAL  Result Date: 01/10/2021 CLINICAL DATA:  UTI. EXAM: RENAL / URINARY TRACT ULTRASOUND COMPLETE COMPARISON:  None. FINDINGS: Right Kidney: Renal measurements: 11.5 cm x 6.1 cm x 6.0 cm = volume: 222 mL. Diffusely increased echogenicity of the renal parenchyma is noted. No mass or hydronephrosis visualized. Left Kidney: Renal measurements: 11.1 cm x 6.0 cm x 4.9 cm = volume: 172 mL. Echogenicity within normal limits. A 2.0 cm x 1.6 cm x 1.6 cm anechoic structure is seen within the upper pole of the left kidney. No  hydronephrosis is visualized. Bladder: Appears normal for degree of bladder distention. Other: The study is limited secondary to the patient's body habitus. IMPRESSION: 1. Diffusely increased echogenicity of the right kidney which may be secondary to a diffuse infectious or inflammatory process. Medical renal disease cannot be excluded. 2. Small left renal cyst. Electronically Signed   By: Virgina Norfolk M.D.   On: 01/10/2021 15:46   DG Chest Port 1 View  Result Date: 01/09/2021 CLINICAL  DATA:  Shortness of breath.  Slurred speech. EXAM: PORTABLE CHEST 1 VIEW COMPARISON:  04/06/2020 FINDINGS: Stable mild cardiomegaly. Aortic atherosclerotic calcification noted. Both lungs are clear. IMPRESSION: Stable mild cardiomegaly. No active lung disease. Electronically Signed   By: Marlaine Hind M.D.   On: 01/09/2021 14:51   EEG adult  Result Date: 01/10/2021 [name], MD     01/10/2021  1:42 PM Routine EEG Report AUTUM BENFER is a 70 y.o. female with a history of spells who is undergoing an EEG to evaluate for seizures. Report: This EEG was acquired with electrodes placed according to the International 10-20 electrode system (including Fp1, Fp2, F3, F4, C3, C4, P3, P4, O1, O2, T3, T4, T5, T6, A1, A2, Fz, Cz, Pz). The following electrodes were missing or displaced: none. The occipital dominant rhythm was 6 Hz. This activity is reactive to stimulation. Drowsiness was manifested by background fragmentation; deeper stages of sleep were identified by K complexes and sleep spindles. There was focal slowing over the left temporoparietal region. There were sharp waves noted occasionally (at times, frequently) in the left temporoparietal region. There were no electrographic seizures identified. Photic stimulation and hyperventilation were not performed. Impression and clinical correlation: This EEG was obtained while awake and asleep and is abnormal due to: - Mild diffuse slowing, indicative of global cerebral  dysfunction - Left temporoparietal slowing indicative of superimposed focal cerebral dysfunction in that area - Sharp waves located over the left temporoparietal region indicating increased epileptogenicity in that area There were no electrographic seizures observed during this recording. [name], MD [facility] [phone] If 7pm- 7am, please page neurology on call as listed in [place].   EEG adult  Result Date: 01/09/2021 Lora Havens, MD     01/09/2021  7:18 PM Patient Name: CHANTE MAYSON MRN: [id number] Epilepsy Attending: Lora Havens Referring Physician/Provider: Dr Kerney Elbe Date: 01/09/2021 Duration: 20.56 mins Patient history: 70 year old female SNF resident with a history of atrial fibrillation, on Eliquis, who presents with acute onset of altered speech.  EEG to evaluate for seizure. Level of alertness: Awake, asleep AEDs during EEG study: None Technical aspects: This EEG study was done with scalp electrodes positioned according to the 10-20 International system of electrode placement. Electrical activity was acquired at a sampling rate of 500Hz  and reviewed with a high frequency filter of 70Hz  and a low frequency filter of 1Hz . EEG data were recorded continuously and digitally stored. Description: The posterior dominant rhythm consists of 8 Hz activity of moderate voltage (25-35 uV) seen predominantly in posterior head regions, asymmetric ( left<right) and reactive to eye opening and eye closing. Sleep was characterized by vertex waves, sleep spindles (12 to 14 Hz), maximal frontocentral region.  EEG showed continuous 3 to 6 Hz theta-delta slowing in left hemisphere, maximal left posterior quadrant. Spikes were also noted in left posterior temporal region. Physiologic photic driving was not seen during photic stimulation. Hyperventilation was not performed.   ABNORMALITY - Spike,left posterior temporal region - Continuous slow, left hemisphere, maximal left posterior quadrant  - Background asymmetry, left<right IMPRESSION: This study showed evidence of epileptogenicity arising from left posterior temporal region. There is also cortical dysfunction arising from left hemisphere, maximal left posterior quadrant,  likely secondary to underlying structural abnormality, post-ictal state. No seizures were seen throughout the recording. Dr Cheral Marker was notified. Lora Havens   CT HEAD CODE STROKE WO CONTRAST  Result Date: 01/09/2021 CLINICAL DATA:  Code stroke. EXAM: CT HEAD  WITHOUT CONTRAST TECHNIQUE: Contiguous axial images were obtained from the base of the skull through the vertex without intravenous contrast. COMPARISON:  04/06/2020 FINDINGS: Brain: There is no acute intracranial hemorrhage, mass effect, or edema. Chronic infarct left MCA territory with associated volume loss. No new loss of gray-white differentiation. Additional areas low-density in the supratentorial white matter are nonspecific but probably reflect stable chronic microvascular ischemic changes. Vascular: No hyperdense vessel. There is intracranial atherosclerotic calcification at the skull base. Skull: Unremarkable. Sinuses/Orbits: Aerated.  Orbits are unremarkable. Other: Mastoid air cells are clear. ASPECTS ([place] Stroke Program Early CT Score) - Ganglionic level infarction (caudate, lentiform nuclei, internal capsule, insula, M1-M3 cortex): 7 - Supraganglionic infarction (M4-M6 cortex): 3 Total score (0-10 with 10 being normal): 10 IMPRESSION: There is no acute intracranial hemorrhage or evidence of acute infarction. ASPECT score is 10. 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 These results were communicated to Dr. Cheral Marker at 2:08 pm on 01/09/2021 by text page via the [facility] messaging system. Electronically Signed   By: Macy Mis M.D.   On: 01/09/2021 14:14      Assessment/Plan:  RICKELLE SYLVESTRE is a 70 y.o. female with medical problems of      was  admitted on 01/09/2021 for :  UTI (urinary tract infection) [N39.0] Speech abnormality [R47.9] MRI contraindicated due to metal implant [Z53.09] Sepsis ([place]) [A41.9] Cerebrovascular accident (CVA), unspecified mechanism ([place]) [I63.9] AMS (altered mental status) [R41.82]  Assessment: #Acute kidney injury #Chronic kidney disease stage IIIb #Sepsis from E. coli bacteremia  Renal imaging: January 10, 2021: Right kidney 11.5 cm, volume 222 mL, diffuse increased echogenicity.  Left kidney 11.1 cm, volume 172 mL, small left renal cyst  Urinalysis: January 09, 2021: Turbid sample, greater than 50 RBCs, greater than 50 WBCs.  Urine culture negative  Blood culture January 10, 2021: Enterobacterales, E. Coli  AKI likely secondary to sepsis  Plan: Agree with volume support Continue to treat underlying cause (sepsis) Avoid hypotension, IV contrast exposure Electrolytes and Volume status are acceptable No acute indication for Dialysis at present    Amrutha Avera 01/11/21

## 2021-01-11 NOTE — Consult Note (Signed)
NAME: Sandra Massey  DOB: Dec 07, 1950  MRN: [id number]  Date/Time: 01/11/2021 1:43 PM  REQUESTING PROVIDER: Dr.Kumar Subjective:  REASON FOR CONSULT: e.coli bacteremia ?No history available from patient Sandra Massey is a 70 y.o. female with a history of DM , CV[place] [facility] with sudden onset of change in speech . 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 Has Afib, HTN and DM In the ED BP 122/74, afebrile, Pulse 77 and stas 90% on RA. BP later decreased to 80 systole And she received 500cc NS bolus and labs done showed wbc 21.2, cr 2.25, INR 2.2, lactate 8.3, blood culture sent and UA sent. Code stroke called MRI showed no acute infarct but Large area of chronic left MCA territory encephalomalacia. Neuro saw her and an EEG showed spike left posterior temporal region concerning for epileptogenicity arising from left posterior temporal region. She was started on keppra. She was also started on broad spectrum antibiotics which was de-escalated to ceftriaxone once blood culture came back as e.coli. I am seeing the patient for the same she was also in AKI on baseline CKD Past Medical History:  Diagnosis Date   Arthritis    Chronic atrial fibrillation (HCC)    CKD (chronic kidney disease), stage III (HCC)    Diabetes mellitus without complication ([place])    History of ischemic cerebrovascular accident (CVA) with residual deficit    Hypertension    Substance abuse ([place])     Past Surgical History:  Procedure Laterality Date   BREAST BIOPSY Right    sebaceous cyst removed    Social History   Socioeconomic History   Marital status: Single    Spouse name: Not on file   Number of children: Not on file   Years of education: Not on file   Highest education level: Not on file  Occupational History   Not on file  Tobacco Use   Smoking status: Never   Smokeless tobacco: Never   Tobacco comments:    2007 quit   Substance and Sexual Activity   Alcohol use:  Never   Drug use: Never   Sexual activity: Not on file  Other Topics Concern   Not on file  Social History Narrative   Not on file   Social Determinants of Health   Financial Resource Strain: Not on file  Food Insecurity: Not on file  Transportation Needs: Not on file  Physical Activity: Not on file  Stress: Not on file  Social Connections: Not on file  Intimate Partner Violence: Not on file    No family history on file. Allergies  Allergen Reactions   Latex Rash   I? Current Facility-Administered Medications  Medication Dose Route Frequency Provider Last Rate Last Admin   0.9 %  sodium chloride infusion   Intravenous Continuous Shawna Clamp, MD 75 mL/hr at 01/10/21 1658 New Bag at 01/10/21 1658   amiodarone (PACERONE) tablet 200 mg  200 mg Oral BID Enzo Bi, MD   200 mg at 01/11/21 1028   cefTRIAXone (ROCEPHIN) 2 g in sodium chloride 0.9 % 100 mL IVPB  2 g Intravenous Q24H Shawna Clamp, MD 200 mL/hr at 01/11/21 0828 2 g at 01/11/21 0102   Chlorhexidine Gluconate Cloth 2 % PADS 6 each  6 each Topical Daily Shawna Clamp, MD   6 each at 01/11/21 1030   docusate sodium (COLACE) capsule 100 mg  100 mg Oral BID PRN Enzo Bi, MD  enoxaparin (LOVENOX) injection 67.5 mg  0.5 mg/kg Subcutaneous Q24H Enzo Bi, MD   67.5 mg at 01/10/21 2238   insulin aspart (novoLOG) injection 0-15 Units  0-15 Units Subcutaneous TID WC Enzo Bi, MD   2 Units at 01/10/21 1710   levETIRAcetam (KEPPRA) IVPB 500 mg/100 mL premix  500 mg Intravenous Q12H Shawna Clamp, MD 400 mL/hr at 01/11/21 0908 500 mg at 01/11/21 0908   LORazepam (ATIVAN) injection 2 mg  2 mg Intravenous Once PRN Enzo Bi, MD       ondansetron [facility]) injection 4 mg  4 mg Intravenous Q6H PRN Enzo Bi, MD       ondansetron (ZOFRAN-ODT) disintegrating tablet 4 mg  4 mg Oral Q8H PRN Enzo Bi, MD       polyethylene glycol (MIRALAX / GLYCOLAX) packet 17 g  17 g Oral BID PRN Enzo Bi, MD       rosuvastatin (CRESTOR) tablet 40  mg  40 mg Oral QPM Enzo Bi, MD   40 mg at 01/10/21 1657     Abtx:  Anti-infectives (From admission, onward)    Start     Dose/Rate Route Frequency Ordered Stop   01/11/21 1300  vancomycin (VANCOREADY) IVPB 750 mg/150 mL  Status:  Discontinued        750 mg 150 mL/hr over 60 Minutes Intravenous Every 24 hours 01/10/21 0235 01/10/21 1116   01/11/21 0800  vancomycin (VANCOREADY) IVPB 1500 mg/300 mL  Status:  Discontinued        1,500 mg 150 mL/hr over 120 Minutes Intravenous Every 48 hours 01/10/21 1116 01/10/21 1448   01/11/21 0800  cefTRIAXone (ROCEPHIN) 2 g in sodium chloride 0.9 % 100 mL IVPB        2 g 200 mL/hr over 30 Minutes Intravenous Every 24 hours 01/10/21 1448     01/10/21 1200  ceFEPIme (MAXIPIME) 2 g in sodium chloride 0.9 % 100 mL IVPB  Status:  Discontinued        2 g 200 mL/hr over 30 Minutes Intravenous Every 12 hours 01/10/21 0219 01/10/21 1448   01/09/21 2245  metroNIDAZOLE (FLAGYL) IVPB 500 mg  Status:  Discontinued        500 mg 100 mL/hr over 60 Minutes Intravenous 2 times daily 01/09/21 2228 01/10/21 1448   01/09/21 2245  vancomycin (VANCOREADY) IVPB 2000 mg/400 mL        2,000 mg 200 mL/hr over 120 Minutes Intravenous  Once 01/09/21 2234 01/10/21 0708   01/09/21 2230  ceFEPIme (MAXIPIME) 2 g in sodium chloride 0.9 % 100 mL IVPB        2 g 200 mL/hr over 30 Minutes Intravenous  Once 01/09/21 2228 01/10/21 0204   01/09/21 2230  vancomycin (VANCOCIN) IVPB 1000 mg/200 mL premix  Status:  Discontinued        1,000 mg 200 mL/hr over 60 Minutes Intravenous  Once 01/09/21 2228 01/09/21 2234       REVIEW OF SYSTEMS:  NA Objective:  VITALS:  BP 109/70 (BP Location: Left Leg)   Pulse (!) 102   Temp 98.2 F (36.8 C)   Resp 18   Ht 5\' 7"  (1.702 m)   Wt 136.1 kg   SpO2 98%   BMI 46.99 kg/m  PHYSICAL EXAM:  General:  pt is in distress but unable to express- she is moaning and shouting Says she does not know where she is hurting  follows some commands    Head: Normocephalic, without obvious abnormality, atraumatic. Eyes:  Conjunctivae clear, anicteric sclerae. Pupils are equal ENT Nares normal. No drainage or sinus tenderness. Lips, mucosa, and tongue normal. No Thrush Poor dental hygiene  Lungs: b/l air entry- rhonchi Heart: irregular Abdomen: Soft, looks like it is tender all over  Extremities: edema legs Skin: No rashes or lesions. Or bruising Lymph: Cervical, supraclavicular normal. Neurologic:rt hemiplegia Pertinent Labs Lab Results CBC    Component Value Date/Time   WBC 17.0 (H) 01/11/2021 0606   RBC 3.60 (L) 01/11/2021 0606   HGB 10.1 (L) 01/11/2021 0606   HCT 33.2 (L) 01/11/2021 0606   PLT 127 (L) 01/11/2021 0606   MCV 92.2 01/11/2021 0606   MCH 28.1 01/11/2021 0606   MCHC 30.4 01/11/2021 0606   RDW 17.4 (H) 01/11/2021 0606   LYMPHSABS 1.3 01/10/2021 0009   MONOABS 1.2 (H) 01/10/2021 0009   EOSABS 0.0 01/10/2021 0009   BASOSABS 0.1 01/10/2021 0009    CMP Latest Ref Rng & Units 01/11/2021 01/10/2021 01/10/2021  Glucose 70 - 99 mg/dL 103(H) 68(L) 80  BUN 8 - 23 mg/dL 56(H) 42(H) 42(H)  Creatinine 0.44 - 1.00 mg/dL 3.29(H) 2.78(H) 2.60(H)  Sodium 135 - 145 mmol/L 138 138 138  Potassium 3.5 - 5.1 mmol/L 4.6 4.5 4.2  Chloride 98 - 111 mmol/L 108 106 104  CO2 22 - 32 mmol/L 20(L) 20(L) 22  Calcium 8.9 - 10.3 mg/dL 7.5(L) 8.2(L) 8.4(L)  Total Protein 6.5 - 8.1 g/dL - - 5.8(L)  Total Bilirubin 0.3 - 1.2 mg/dL - - 1.0  Alkaline Phos 38 - 126 U/L - - 62  AST 15 - 41 U/L - - 181(H)  ALT 0 - 44 U/L - - 189(H)      Microbiology: Recent Results (from the past 240 hour(s))  Resp Panel by RT-PCR (Flu A&B, Covid) Nasopharyngeal Swab     Status: None   Collection Time: 01/09/21  2:17 PM   Specimen: Nasopharyngeal Swab; Nasopharyngeal(NP) swabs in vial transport medium  Result Value Ref Range Status   SARS Coronavirus 2 by RT PCR NEGATIVE NEGATIVE Final    Comment: (NOTE) SARS-CoV-2 target nucleic acids are NOT  DETECTED.  The SARS-CoV-2 RNA is generally detectable in upper respiratory specimens during the acute phase of infection. The lowest concentration of SARS-CoV-2 viral copies this assay can detect is 138 copies/mL. A negative result does not preclude SARS-Cov-2 infection and should not be used as the sole basis for treatment or other patient management decisions. A negative result may occur with  improper specimen collection/handling, submission of specimen other than nasopharyngeal swab, presence of viral mutation(s) within the areas targeted by this assay, and inadequate number of viral copies(<138 copies/mL). A negative result must be combined with clinical observations, patient history, and epidemiological information. The expected result is Negative.  Fact Sheet for Patients:  EntrepreneurPulse.com.au  Fact Sheet for Healthcare Providers:  IncredibleEmployment.be  This test is no t yet approved or cleared by the [place] FDA and  has been authorized for detection and/or diagnosis of SARS-CoV-2 by FDA under an Emergency Use Authorization (EUA). This EUA will remain  in effect (meaning this test can be used) for the duration of the COVID-19 declaration under Section 564(b)(1) of the Act, 21 U.S.C.section 360bbb-3(b)(1), unless the authorization is terminated  or revoked sooner.       Influenza A by PCR NEGATIVE NEGATIVE Final   Influenza B by PCR NEGATIVE NEGATIVE Final    Comment: (NOTE) The Xpert Xpress SARS-CoV-2/FLU/RSV plus assay is intended as an aid in  the diagnosis of influenza from Nasopharyngeal swab specimens and should not be used as a sole basis for treatment. Nasal washings and aspirates are unacceptable for Xpert Xpress SARS-CoV-2/FLU/RSV testing.  Fact Sheet for Patients: EntrepreneurPulse.com.au  Fact Sheet for Healthcare Providers: IncredibleEmployment.be  This test is not yet  approved or cleared by the [place] FDA and has been authorized for detection and/or diagnosis of SARS-CoV-2 by FDA under an Emergency Use Authorization (EUA). This EUA will remain in effect (meaning this test can be used) for the duration of the COVID-19 declaration under Section 564(b)(1) of the Act, 21 U.S.C. section 360bbb-3(b)(1), unless the authorization is terminated or revoked.  Performed at [facility], [address] [place], [place] [address]   Culture, blood (Routine X 2) w Reflex to ID Panel     Status: None (Preliminary result)   Collection Time: 01/10/21 12:09 AM   Specimen: BLOOD  Result Value Ref Range Status   Specimen Description   Final    BLOOD RIGHT FOREARM Performed at [facility], [address], [place], [place] [address]    Special Requests   Final    BOTTLES DRAWN AEROBIC AND ANAEROBIC Blood Culture results may not be optimal due to an excessive volume of blood received in culture bottles Performed at [facility], [address] [place], [place] [address]    Culture  Setup Time   Final    GRAM NEGATIVE RODS IN BOTH AEROBIC AND ANAEROBIC BOTTLES CRITICAL RESULT CALLED TO, READ BACK BY AND VERIFIED WITH: PHARMD S HALLHAI [id number] AT 815 AM BY CM    Culture   Final    GRAM NEGATIVE RODS CULTURE REINCUBATED FOR BETTER GROWTH Performed at [facility], [place] [address] [place], [place] [address]    Report Status PENDING  Incomplete  Culture, blood (Routine X 2) w Reflex to ID Panel     Status: Abnormal (Preliminary result)   Collection Time: 01/10/21 12:09 AM   Specimen: BLOOD  Result Value Ref Range Status   Specimen Description   Final    BLOOD LEFT ANTECUBITAL Performed at [facility], [place] [address] [place], [place] [address]    Special Requests   Final    BOTTLES DRAWN AEROBIC AND ANAEROBIC Blood Culture adequate volume Performed at [address] [place], [place]  [address]    Culture  Setup Time   Final    GRAM NEGATIVE RODS IN BOTH AEROBIC AND ANAEROBIC BOTTLES CRITICAL RESULT CALLED TO, READ BACK BY AND VERIFIED WITH: RAQUEL GOODMAN, PHARMD AT 1350 ON 01/10/21 BY GM    Culture (A)  Final    ESCHERICHIA COLI CULTURE REINCUBATED FOR BETTER GROWTH SUSCEPTIBILITIES TO FOLLOW Performed at [facility], [place] [address], [place], [place] [address]    Report Status PENDING  Incomplete  Blood Culture ID Panel (Reflexed)     Status: Abnormal   Collection Time: 01/10/21 12:09 AM  Result Value Ref Range Status   Enterococcus faecalis NOT DETECTED NOT DETECTED Final   Enterococcus Faecium NOT DETECTED NOT DETECTED Final   Listeria monocytogenes NOT DETECTED NOT DETECTED Final   Staphylococcus species NOT DETECTED NOT DETECTED Final   Staphylococcus aureus (BCID) NOT DETECTED NOT DETECTED Final   Staphylococcus epidermidis NOT DETECTED NOT DETECTED Final   Staphylococcus lugdunensis NOT DETECTED NOT DETECTED Final   Streptococcus species NOT DETECTED NOT DETECTED Final   Streptococcus agalactiae NOT DETECTED NOT DETECTED Final   Streptococcus  pneumoniae NOT DETECTED NOT DETECTED Final   Streptococcus pyogenes NOT DETECTED NOT DETECTED Final   A.calcoaceticus-baumannii NOT DETECTED NOT DETECTED Final   Bacteroides fragilis NOT DETECTED NOT DETECTED Final   Enterobacterales DETECTED (A) NOT DETECTED Final    Comment: Enterobacterales represent a large order of gram negative bacteria, not a single organism. CRITICAL RESULT CALLED TO, READ BACK BY AND VERIFIED WITH: [place], PHARMD AT 1350 ON 01/10/21 BY GM    Enterobacter cloacae complex NOT DETECTED NOT DETECTED Final   Escherichia coli DETECTED (A) NOT DETECTED Final    Comment: CRITICAL RESULT CALLED TO, READ BACK BY AND VERIFIED WITH: [place], PHARMD AT 1350 ON 01/10/21 BY GM    Klebsiella aerogenes NOT DETECTED NOT DETECTED Final   Klebsiella oxytoca NOT DETECTED NOT DETECTED Final    Klebsiella pneumoniae NOT DETECTED NOT DETECTED Final   Proteus species NOT DETECTED NOT DETECTED Final   Salmonella species NOT DETECTED NOT DETECTED Final   Serratia marcescens NOT DETECTED NOT DETECTED Final   Haemophilus influenzae NOT DETECTED NOT DETECTED Final   Neisseria meningitidis NOT DETECTED NOT DETECTED Final   Pseudomonas aeruginosa NOT DETECTED NOT DETECTED Final   Stenotrophomonas maltophilia NOT DETECTED NOT DETECTED Final   Candida albicans NOT DETECTED NOT DETECTED Final   Candida auris NOT DETECTED NOT DETECTED Final   Candida glabrata NOT DETECTED NOT DETECTED Final   Candida krusei NOT DETECTED NOT DETECTED Final   Candida parapsilosis NOT DETECTED NOT DETECTED Final   Candida tropicalis NOT DETECTED NOT DETECTED Final   Cryptococcus neoformans/gattii NOT DETECTED NOT DETECTED Final   CTX-M ESBL NOT DETECTED NOT DETECTED Final   Carbapenem resistance IMP NOT DETECTED NOT DETECTED Final   Carbapenem resistance KPC NOT DETECTED NOT DETECTED Final   Carbapenem resistance NDM NOT DETECTED NOT DETECTED Final   Carbapenem resist OXA 48 LIKE NOT DETECTED NOT DETECTED Final   Carbapenem resistance VIM NOT DETECTED NOT DETECTED Final    Comment: Performed at [facility], [address] [place], [place] [address]    IMAGING RESULTS:  I have personally reviewed the films ? Impression/Recommendation ? ?e.coli bacteremia with UTI Rt pyelonephritis on [place] Continue ceftriaxone Recommend CT to look for other organs as she is tender but not able to express  Speech difficulty- no new stroke R/o TIA R/o Seizure  H/o Left MCA infarct with rt hemiplegia and expressive aphasia Left encephalomalacia  Afib on amiodarone  DM on insulin   ? ___________________________________________________ Discussed  the management with the requesting provider Note:  This document was prepared using Dragon voice recognition software and may include unintentional  dictation errors.

## 2021-01-12 ENCOUNTER — Inpatient Hospital Stay: Payer: Medicare HMO

## 2021-01-12 DIAGNOSIS — B9620 Unspecified Escherichia coli [E. coli] as the cause of diseases classified elsewhere: Secondary | ICD-10-CM | POA: Diagnosis not present

## 2021-01-12 DIAGNOSIS — I639 Cerebral infarction, unspecified: Secondary | ICD-10-CM | POA: Diagnosis not present

## 2021-01-12 DIAGNOSIS — N10 Acute tubulo-interstitial nephritis: Secondary | ICD-10-CM | POA: Diagnosis not present

## 2021-01-12 DIAGNOSIS — R7881 Bacteremia: Secondary | ICD-10-CM | POA: Diagnosis not present

## 2021-01-12 DIAGNOSIS — A419 Sepsis, unspecified organism: Secondary | ICD-10-CM | POA: Diagnosis not present

## 2021-01-12 LAB — COMPREHENSIVE METABOLIC PANEL
ALT: 178 U/L — ABNORMAL HIGH (ref 0–44)
ALT: 164 U/L — ABNORMAL HIGH (ref 0–44)
AST: 149 U/L — ABNORMAL HIGH (ref 15–41)
AST: 132 U/L — ABNORMAL HIGH (ref 15–41)
Albumin: 2.4 g/dL — ABNORMAL LOW (ref 3.5–5.0)
Albumin: 2.4 g/dL — ABNORMAL LOW (ref 3.5–5.0)
Alkaline Phosphatase: 137 U/L — ABNORMAL HIGH (ref 38–126)
Alkaline Phosphatase: 140 U/L — ABNORMAL HIGH (ref 38–126)
Anion gap: 12 (ref 5–15)
Anion gap: 14 (ref 5–15)
BUN: 63 mg/dL — ABNORMAL HIGH (ref 8–23)
BUN: 65 mg/dL — ABNORMAL HIGH (ref 8–23)
CO2: 21 mmol/L — ABNORMAL LOW (ref 22–32)
CO2: 18 mmol/L — ABNORMAL LOW (ref 22–32)
Calcium: 7.6 mg/dL — ABNORMAL LOW (ref 8.9–10.3)
Calcium: 7.5 mg/dL — ABNORMAL LOW (ref 8.9–10.3)
Chloride: 107 mmol/L (ref 98–111)
Chloride: 108 mmol/L (ref 98–111)
Creatinine, Ser: 4.14 mg/dL — ABNORMAL HIGH (ref 0.44–1.00)
Creatinine, Ser: 4.27 mg/dL — ABNORMAL HIGH (ref 0.44–1.00)
GFR, Estimated: 11 mL/min — ABNORMAL LOW (ref >60)
GFR, Estimated: 11 mL/min — ABNORMAL LOW (ref >60)
Glucose, Bld: 118 mg/dL — ABNORMAL HIGH (ref 70–99)
Glucose, Bld: 115 mg/dL — ABNORMAL HIGH (ref 70–99)
Potassium: 4.8 mmol/L (ref 3.5–5.1)
Potassium: 4.9 mmol/L (ref 3.5–5.1)
Sodium: 140 mmol/L (ref 135–145)
Sodium: 140 mmol/L (ref 135–145)
Total Bilirubin: 1.1 mg/dL (ref 0.3–1.2)
Total Bilirubin: 0.9 mg/dL (ref 0.3–1.2)
Total Protein: 6 g/dL — ABNORMAL LOW (ref 6.5–8.1)
Total Protein: 5.6 g/dL — ABNORMAL LOW (ref 6.5–8.1)

## 2021-01-12 LAB — CBC
HCT: 33.1 % — ABNORMAL LOW (ref 36.0–46.0)
Hemoglobin: 10.3 g/dL — ABNORMAL LOW (ref 12.0–15.0)
MCH: 28.5 pg (ref 26.0–34.0)
MCHC: 31.1 g/dL (ref 30.0–36.0)
MCV: 91.4 fL (ref 80.0–100.0)
Platelets: 161 10*3/uL (ref 150–400)
RBC: 3.62 MIL/uL — ABNORMAL LOW (ref 3.87–5.11)
RDW: 17.9 % — ABNORMAL HIGH (ref 11.5–15.5)
WBC: 16.3 10*3/uL — ABNORMAL HIGH (ref 4.0–10.5)
nRBC: 0 % (ref 0.0–0.2)

## 2021-01-12 LAB — GLUCOSE, CAPILLARY
Glucose-Capillary: 141 mg/dL — ABNORMAL HIGH (ref 70–99)
Glucose-Capillary: 115 mg/dL — ABNORMAL HIGH (ref 70–99)
Glucose-Capillary: 121 mg/dL — ABNORMAL HIGH (ref 70–99)
Glucose-Capillary: 159 mg/dL — ABNORMAL HIGH (ref 70–99)

## 2021-01-12 LAB — MAGNESIUM: Magnesium: 2.1 mg/dL (ref 1.7–2.4)

## 2021-01-12 MED ORDER — APIXABAN 5 MG PO TABS
5.0000 mg | ORAL_TABLET | Freq: Two times a day (BID) | ORAL | Status: DC
  Administered 2021-01-12: 5 mg via ORAL
  Filled 2021-01-12 (×6): qty 1

## 2021-01-12 MED ORDER — ENOXAPARIN SODIUM 30 MG/0.3ML IJ SOSY: 30.0000 mg | PREFILLED_SYRINGE | INTRAMUSCULAR | Status: DC

## 2021-01-12 NOTE — Progress Notes (Signed)
Patient foley catheter leaking, patient pulling and complaining of pain.  Foley removed.  Bladder scanner showed 93ml of urine in bladder.  MD ordered CT scan of abdomen.

## 2021-01-12 NOTE — Progress Notes (Signed)
Validated before reading finished.

## 2021-01-12 NOTE — Progress Notes (Signed)
PROGRESS NOTE    ALVIS PULCINI  ZOX:[id number] DOB: [birth year] DOA: 01/09/2021 PCP: [name], MD   Brief Narrative: This 70 yrs old  female who is Jehovah's Witness with PMH significant of old left MCA territory infarct, with right-sided hemiparesis, chronic A. fib, insulin-dependent diabetes mellitus, hypertension who was sent from SNF for a change in speech. Not able to obtain hx from pt due her speech difficulty and apparent cognitive deficits. CT Head : no acute findings. Neurology consulted recommended stroke work-up.  EEG showed evidence of epileptogenicity arising in the left posterior temporal region. Patient is started on Keppra 500 mg twice daily.  Neurology recommended repeat EEG and frequent neurochecks.   Patient became hypotensive requiring IV fluids.  Blood cultures grew E. Coli,  started on IV ceftriaxone.  Nephrology consulted for acute on chronic kidney disease.  Infectious diseases consulted for E. coli bacteremia to guide antibiotic regimen.  Assessment & Plan:   Active Problems:   Speech abnormality   Sepsis ([place])   Expressive aphasia: PMH : Hx of left MCA territory infarct, with right-sided hemiparesis: Patient was noted to have a change in her speech, She was also noted to have baseline difficulty speaking on her prior admission. Neurology consulted and initiated a stroke work-up. MRI brain: No acute intracranial abnormality.  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 EEG showed evidence of epileptogenicity arising in the left posterior temporal region. Neurology recommended to repeat EEG,  started on Keppra. Repeat EEG mild diffuse slowing indicative of global cerebral dysfunction, increased epileptogenicity in the left temporoparietal region Continue Keppra twice daily.  Neurology follow-up.  Severe sepsis /E. coli bacteremia: Patient has developed leukocytosis, hypotension, elevated lactic acid.  Blood cultures E. Coli+ WBC 21.2,  procalcitonin 43.2, lactic acid 4.1. Hypotension and lactic acidosis improved with IV hydration. Procalcitonin normalized.   Started on empiric cefepime and vancomycin. Antibiotic de-escalated to ceftriaxone. Infectious disease consulted, continue ceftriaxone. Obtain CT abdomen and pelvis to look for other organs.  Acute kidney injury on CKD stage IIIb. Baseline creatinine 1.58, creatinine on admission 2.25 Suspect due to hypotension / sepsis .   Creatinine up to 3.29>4.14>4.24 Avoid nephrotoxic medications, hypotension. Nephrology consulted, continue to monitor serum creatinine. Continue IV hydration.  No need for hemodialysis at this point.   Elevated troponin: 157 > 147 . Patient denies any chest pain. Patient has mildly elevated trop at baseline, ~100's.   Chronic A. fib on Eliquis: Continue amiodarone Continue Eliquis.   Essential hypertension. Blood Pressure is on soft side. Hold amlodipine.   Type 2 diabetes ACHS BG checks and SSI TID for now.  Hypomagnesemia: Replaced, continue to monitor.  Elevated INR Elevated liver enzymes Unclear etiology.  No history of cirrhosis Monitor for now.   Cognitive deficits Could be secondary to dementia.     DVT prophylaxis: SCDs Code Status: DNR Family Communication: No family at bed side. Disposition Plan:    Status is: Inpatient  Remains inpatient appropriate because: Severe sepsis, slurred speech, AKI on CKD.  Consultants:  Neurology Infectious disease Nephrology  Procedures: MRI/EEG Antimicrobials:   Anti-infectives (From admission, onward)    Start     Dose/Rate Route Frequency Ordered Stop   01/11/21 1300  vancomycin (VANCOREADY) IVPB 750 mg/150 mL  Status:  Discontinued        750 mg 150 mL/hr over 60 Minutes Intravenous Every 24 hours 01/10/21 0235 01/10/21 1116   01/11/21 0800  vancomycin (VANCOREADY) IVPB 1500 mg/300 mL  Status:  Discontinued  1,500 mg 150 mL/hr over 120 Minutes Intravenous  Every 48 hours 01/10/21 1116 01/10/21 1448   01/11/21 0800  cefTRIAXone (ROCEPHIN) 2 g in sodium chloride 0.9 % 100 mL IVPB        2 g 200 mL/hr over 30 Minutes Intravenous Every 24 hours 01/10/21 1448     01/10/21 1200  ceFEPIme (MAXIPIME) 2 g in sodium chloride 0.9 % 100 mL IVPB  Status:  Discontinued        2 g 200 mL/hr over 30 Minutes Intravenous Every 12 hours 01/10/21 0219 01/10/21 1448   01/09/21 2245  metroNIDAZOLE (FLAGYL) IVPB 500 mg  Status:  Discontinued        500 mg 100 mL/hr over 60 Minutes Intravenous 2 times daily 01/09/21 2228 01/10/21 1448   01/09/21 2245  vancomycin (VANCOREADY) IVPB 2000 mg/400 mL        2,000 mg 200 mL/hr over 120 Minutes Intravenous  Once 01/09/21 2234 01/10/21 0708   01/09/21 2230  ceFEPIme (MAXIPIME) 2 g in sodium chloride 0.9 % 100 mL IVPB        2 g 200 mL/hr over 30 Minutes Intravenous  Once 01/09/21 2228 01/10/21 0204   01/09/21 2230  vancomycin (VANCOCIN) IVPB 1000 mg/200 mL premix  Status:  Discontinued        1,000 mg 200 mL/hr over 60 Minutes Intravenous  Once 01/09/21 2228 01/09/21 2234       Subjective: Patient was seen and examined at bedside.  Overnight events noted.   Patient still appears confused but following commands.  She complains of this dry mouth.     Objective: Vitals:   01/12/21 0025 01/12/21 0350 01/12/21 0820 01/12/21 1212  BP: (!) 98/59 (!) 121/107 109/86 117/70  Pulse: 63 88 96 87  Resp: 18 20 17 18   Temp: 98.1 F (36.7 C) 97.7 F (36.5 C) 98.5 F (36.9 C) 98.4 F (36.9 C)  TempSrc:      SpO2: 94% 98% 98% 95%  Weight:      Height:        Intake/Output Summary (Last 24 hours) at 01/12/2021 1331 Last data filed at 01/12/2021 0030 Gross per 24 hour  Intake 2017.66 ml  Output 750 ml  Net 1267.66 ml   Filed Weights   01/09/21 1352  Weight: 136.1 kg    Examination:  General exam: Appears  comfortable, not in any acute distress.  Deconditioned Respiratory system: Clear to auscultation bilaterally  . Respiratory effort normal. RR 18 Cardiovascular system: S1-S2 heard, regular rate and rhythm, no murmur. Gastrointestinal system: Abdomen is soft, nontender, nondistended, BS + Central nervous system: Alert and oriented x 1 .  Garbling of speech Extremities: No edema, no cyanosis, no clubbing. Skin: No rashes, lesions or ulcers Psychiatry: Judgement and insight appear normal. Mood & affect appropriate.     Data Reviewed: I have personally reviewed following labs and imaging studies  CBC: Recent Labs  Lab 01/09/21 1417 01/10/21 0009 01/10/21 0457 01/11/21 0606 01/12/21 0541  WBC 21.2* 33.3* 27.7* 17.0* 16.3*  NEUTROABS 19.1* 28.9*  --   --   --   HGB 12.1 11.0* 10.6* 10.1* 10.3*  HCT 38.3 34.6* 32.9* 33.2* 33.1*  MCV 89.7 89.4 91.1 92.2 91.4  PLT 197 179 156 127* 798   Basic Metabolic Panel: Recent Labs  Lab 01/10/21 0009 01/10/21 0457 01/11/21 0606 01/12/21 0541 01/12/21 1200  NA 138 138 138 140 140  K 4.2 4.5 4.6 4.8 4.9  CL 104 106 108  107 108  CO2 22 20* 20* 21* 18*  GLUCOSE 80 68* 103* 118* 115*  BUN 42* 42* 56* 63* 65*  CREATININE 2.60* 2.78* 3.29* 4.14* 4.27*  CALCIUM 8.4* 8.2* 7.5* 7.6* 7.5*  MG  --  1.0* 1.5* 2.1  --    GFR: Estimated Creatinine Clearance: 18.2 mL/min (A) (by C-G formula based on SCr of 4.27 mg/dL (H)). Liver Function Tests: Recent Labs  Lab 01/09/21 1417 01/10/21 0009 01/11/21 0606 01/12/21 0541 01/12/21 1200  AST 87* 181* 244* 149* 132*  ALT 59* 189* 215* 178* 164*  ALKPHOS 112 62 90 137* 140*  BILITOT 1.0 1.0 0.9 1.1 0.9  PROT 6.2* 5.8* 5.2* 6.0* 5.6*  ALBUMIN 2.9* 2.7* 2.2* 2.4* 2.4*   No results for input(s): LIPASE, AMYLASE in the last 168 hours. No results for input(s): AMMONIA in the last 168 hours. Coagulation Profile: Recent Labs  Lab 01/09/21 1417 01/10/21 0009  INR 2.2* 2.8*   Cardiac Enzymes: No results for input(s): CKTOTAL, CKMB, CKMBINDEX, TROPONINI in the last 168 hours. BNP (last 3 results) No  results for input(s): PROBNP in the last 8760 hours. HbA1C: Recent Labs    01/09/21 1735  HGBA1C 6.9*   CBG: Recent Labs  Lab 01/11/21 0757 01/11/21 1117 01/11/21 1633 01/12/21 0821 01/12/21 1222  GLUCAP 98 108* 134* 141* 115*   Lipid Profile: No results for input(s): CHOL, HDL, LDLCALC, TRIG, CHOLHDL, LDLDIRECT in the last 72 hours. Thyroid Function Tests: No results for input(s): TSH, T4TOTAL, FREET4, T3FREE, THYROIDAB in the last 72 hours. Anemia Panel: No results for input(s): VITAMINB12, FOLATE, FERRITIN, TIBC, IRON, RETICCTPCT in the last 72 hours. Sepsis Labs: Recent Labs  Lab 01/09/21 1542 01/09/21 1735 01/10/21 0008 01/10/21 0457 01/11/21 0606 01/11/21 0905  PROCALCITON 43.25  --   --  28.66 <0.10  --   LATICACIDVEN 8.3* 7.1* 4.4* 4.1*  --  1.4    Recent Results (from the past 240 hour(s))  Resp Panel by RT-PCR (Flu A&B, Covid) Nasopharyngeal Swab     Status: None   Collection Time: 01/09/21  2:17 PM   Specimen: Nasopharyngeal Swab; Nasopharyngeal(NP) swabs in vial transport medium  Result Value Ref Range Status   SARS Coronavirus 2 by RT PCR NEGATIVE NEGATIVE Final    Comment: (NOTE) SARS-CoV-2 target nucleic acids are NOT DETECTED.  The SARS-CoV-2 RNA is generally detectable in upper respiratory specimens during the acute phase of infection. The lowest concentration of SARS-CoV-2 viral copies this assay can detect is 138 copies/mL. A negative result does not preclude SARS-Cov-2 infection and should not be used as the sole basis for treatment or other patient management decisions. A negative result may occur with  improper specimen collection/handling, submission of specimen other than nasopharyngeal swab, presence of viral mutation(s) within the areas targeted by this assay, and inadequate number of viral copies(<138 copies/mL). A negative result must be combined with clinical observations, patient history, and epidemiological information. The  expected result is Negative.  Fact Sheet for Patients:  EntrepreneurPulse.com.au  Fact Sheet for Healthcare Providers:  IncredibleEmployment.be  This test is no t yet approved or cleared by the [place] FDA and  has been authorized for detection and/or diagnosis of SARS-CoV-2 by FDA under an Emergency Use Authorization (EUA). This EUA will remain  in effect (meaning this test can be used) for the duration of the COVID-19 declaration under Section 564(b)(1) of the Act, 21 U.S.C.section 360bbb-3(b)(1), unless the authorization is terminated  or revoked sooner.  Influenza A by PCR NEGATIVE NEGATIVE Final   Influenza B by PCR NEGATIVE NEGATIVE Final    Comment: (NOTE) The Xpert Xpress SARS-CoV-2/FLU/RSV plus assay is intended as an aid in the diagnosis of influenza from Nasopharyngeal swab specimens and should not be used as a sole basis for treatment. Nasal washings and aspirates are unacceptable for Xpert Xpress SARS-CoV-2/FLU/RSV testing.  Fact Sheet for Patients: EntrepreneurPulse.com.au  Fact Sheet for Healthcare Providers: IncredibleEmployment.be  This test is not yet approved or cleared by the [place] FDA and has been authorized for detection and/or diagnosis of SARS-CoV-2 by FDA under an Emergency Use Authorization (EUA). This EUA will remain in effect (meaning this test can be used) for the duration of the COVID-19 declaration under Section 564(b)(1) of the Act, 21 U.S.C. section 360bbb-3(b)(1), unless the authorization is terminated or revoked.  Performed at [facility], [address] [place], [place] [address]   Culture, blood (Routine X 2) w Reflex to ID Panel     Status: Abnormal (Preliminary result)   Collection Time: 01/10/21 12:09 AM   Specimen: BLOOD  Result Value Ref Range Status   Specimen Description   Final    BLOOD RIGHT FOREARM Performed at [facility], [address] [place], [place] [address]    Special Requests   Final    BOTTLES DRAWN AEROBIC AND ANAEROBIC Blood Culture results may not be optimal due to an excessive volume of blood received in culture bottles Performed at [facility], [address], [place], [place] [address]    Culture  Setup Time   Final    GRAM NEGATIVE RODS IN BOTH AEROBIC AND ANAEROBIC BOTTLES CRITICAL RESULT CALLED TO, READ BACK BY AND VERIFIED WITH: PHARMD S HALLHAI [id number] AT 815 AM BY CM Performed at [facility], [place] [address], [place], [place] [address]    Culture ESCHERICHIA COLI (A)  Final   Report Status PENDING  Incomplete  Culture, blood (Routine X 2) w Reflex to ID Panel     Status: Abnormal (Preliminary result)   Collection Time: 01/10/21 12:09 AM   Specimen: BLOOD  Result Value Ref Range Status   Specimen Description   Final    BLOOD LEFT ANTECUBITAL Performed at [facility], [place] [address], [place], [place] [address]    Special Requests   Final    BOTTLES DRAWN AEROBIC AND ANAEROBIC Blood Culture adequate volume Performed at [facility] [place], [place] [address]    Culture  Setup Time   Final    GRAM NEGATIVE RODS IN BOTH AEROBIC AND ANAEROBIC BOTTLES CRITICAL RESULT CALLED TO, READ BACK BY AND VERIFIED WITH: [place], PHARMD AT 1350 ON 01/10/21 BY GM    Culture (A)  Final    ESCHERICHIA COLI SUSCEPTIBILITIES TO FOLLOW Performed at [facility], [place] [address] [place], [place] [address]    Report Status PENDING  Incomplete  Blood Culture ID Panel (Reflexed)     Status: Abnormal   Collection Time: 01/10/21 12:09 AM  Result Value Ref Range Status   Enterococcus faecalis NOT DETECTED NOT DETECTED Final   Enterococcus Faecium NOT DETECTED NOT DETECTED Final   Listeria monocytogenes NOT DETECTED NOT DETECTED Final   Staphylococcus species NOT DETECTED NOT DETECTED Final   Staphylococcus aureus (BCID) NOT  DETECTED NOT DETECTED Final   Staphylococcus epidermidis NOT DETECTED NOT DETECTED Final   Staphylococcus lugdunensis NOT DETECTED NOT DETECTED Final   Streptococcus  species NOT DETECTED NOT DETECTED Final   Streptococcus agalactiae NOT DETECTED NOT DETECTED Final   Streptococcus pneumoniae NOT DETECTED NOT DETECTED Final   Streptococcus pyogenes NOT DETECTED NOT DETECTED Final   A.calcoaceticus-baumannii NOT DETECTED NOT DETECTED Final   Bacteroides fragilis NOT DETECTED NOT DETECTED Final   Enterobacterales DETECTED (A) NOT DETECTED Final    Comment: Enterobacterales represent a large order of gram negative bacteria, not a single organism. CRITICAL RESULT CALLED TO, READ BACK BY AND VERIFIED WITH: [place], PHARMD AT 1350 ON 01/10/21 BY GM    Enterobacter cloacae complex NOT DETECTED NOT DETECTED Final   Escherichia coli DETECTED (A) NOT DETECTED Final    Comment: CRITICAL RESULT CALLED TO, READ BACK BY AND VERIFIED WITH: [place], PHARMD AT 1350 ON 01/10/21 BY GM    Klebsiella aerogenes NOT DETECTED NOT DETECTED Final   Klebsiella oxytoca NOT DETECTED NOT DETECTED Final   Klebsiella pneumoniae NOT DETECTED NOT DETECTED Final   Proteus species NOT DETECTED NOT DETECTED Final   Salmonella species NOT DETECTED NOT DETECTED Final   Serratia marcescens NOT DETECTED NOT DETECTED Final   Haemophilus influenzae NOT DETECTED NOT DETECTED Final   Neisseria meningitidis NOT DETECTED NOT DETECTED Final   Pseudomonas aeruginosa NOT DETECTED NOT DETECTED Final   Stenotrophomonas maltophilia NOT DETECTED NOT DETECTED Final   Candida albicans NOT DETECTED NOT DETECTED Final   Candida auris NOT DETECTED NOT DETECTED Final   Candida glabrata NOT DETECTED NOT DETECTED Final   Candida krusei NOT DETECTED NOT DETECTED Final   Candida parapsilosis NOT DETECTED NOT DETECTED Final   Candida tropicalis NOT DETECTED NOT DETECTED Final   Cryptococcus neoformans/gattii NOT DETECTED NOT DETECTED  Final   CTX-M ESBL NOT DETECTED NOT DETECTED Final   Carbapenem resistance IMP NOT DETECTED NOT DETECTED Final   Carbapenem resistance KPC NOT DETECTED NOT DETECTED Final   Carbapenem resistance NDM NOT DETECTED NOT DETECTED Final   Carbapenem resist OXA 48 LIKE NOT DETECTED NOT DETECTED Final   Carbapenem resistance VIM NOT DETECTED NOT DETECTED Final    Comment: Performed at [facility], [address], [place], [place] [address]  Urine Culture     Status: Abnormal   Collection Time: 01/10/21  1:05 PM   Specimen: Urine, Random  Result Value Ref Range Status   Specimen Description   Final    URINE, RANDOM Performed at [facility], [address] [place], [place] [address]    Special Requests   Final    NONE Performed at [facility], [address] [place], [place] [address]    Culture MULTIPLE SPECIES PRESENT, SUGGEST RECOLLECTION (A)  Final   Report Status 01/11/2021 FINAL  Final    Radiology Studies: US RENAL  Result Date: 01/10/2021 CLINICAL DATA:  UTI. EXAM: RENAL / URINARY TRACT ULTRASOUND COMPLETE COMPARISON:  None. FINDINGS: Right Kidney: Renal measurements: 11.5 cm x 6.1 cm x 6.0 cm = volume: 222 mL. Diffusely increased echogenicity of the renal parenchyma is noted. No mass or hydronephrosis visualized. Left Kidney: Renal measurements: 11.1 cm x 6.0 cm x 4.9 cm = volume: 172 mL. Echogenicity within normal limits. A 2.0 cm x 1.6 cm x 1.6 cm anechoic structure is seen within the upper pole of the left kidney. No hydronephrosis is visualized. Bladder: Appears normal for degree of bladder distention. Other: The study is limited secondary to the patient's body habitus. IMPRESSION: 1. Diffusely increased echogenicity of the right kidney which may be secondary to a  diffuse infectious or inflammatory process. Medical renal disease cannot be excluded. 2. Small left renal cyst. Electronically Signed   By: Virgina Norfolk M.D.   On: 01/10/2021 15:46    EEG adult  Result Date: 01/10/2021 [name], MD     01/10/2021  1:42 PM Routine EEG Report KEARSTEN GINTHER is a 70 y.o. female with a history of spells who is undergoing an EEG to evaluate for seizures. Report: This EEG was acquired with electrodes placed according to the International 10-20 electrode system (including Fp1, Fp2, F3, F4, C3, C4, P3, P4, O1, O2, T3, T4, T5, T6, A1, A2, Fz, Cz, Pz). The following electrodes were missing or displaced: none. The occipital dominant rhythm was 6 Hz. This activity is reactive to stimulation. Drowsiness was manifested by background fragmentation; deeper stages of sleep were identified by K complexes and sleep spindles. There was focal slowing over the left temporoparietal region. There were sharp waves noted occasionally (at times, frequently) in the left temporoparietal region. There were no electrographic seizures identified. Photic stimulation and hyperventilation were not performed. Impression and clinical correlation: This EEG was obtained while awake and asleep and is abnormal due to: - Mild diffuse slowing, indicative of global cerebral dysfunction - Left temporoparietal slowing indicative of superimposed focal cerebral dysfunction in that area - Sharp waves located over the left temporoparietal region indicating increased epileptogenicity in that area There were no electrographic seizures observed during this recording. [name], MD [facility] [phone] If 7pm- 7am, please page neurology on call as listed in [place].    Scheduled Meds:  amiodarone  200 mg Oral BID   Chlorhexidine Gluconate Cloth  6 each Topical Daily   enoxaparin (LOVENOX) injection  0.5 mg/kg Subcutaneous Q24H   insulin aspart  0-15 Units Subcutaneous TID WC   rosuvastatin  40 mg Oral QPM   Continuous Infusions:  sodium chloride 75 mL/hr at 01/12/21 1021   cefTRIAXone (ROCEPHIN)  IV 2 g (01/12/21 1138)   levETIRAcetam 500 mg (01/12/21 1022)     LOS: 3 days     Time spent: 25 mins    Niccolo Burggraf, MD [facility]   If 7PM-7AM, please contact night-coverage

## 2021-01-12 NOTE — Progress Notes (Addendum)
[facility]  ROUNDING NOTE   Subjective:   Patient seen laying in bed, resting quietly Drowsy, confused Creatinine increased Recorded urine output of 1.45L IVF  Objective:  Vital signs in last 24 hours:  Temp:  [97.7 F (36.5 C)-99 F (37.2 C)] 98.4 F (36.9 C) (11/04 1212) Pulse Rate:  [63-96] 87 (11/04 1212) Resp:  [17-20] 18 (11/04 1212) BP: (98-121)/(59-107) 117/70 (11/04 1212) SpO2:  [93 %-98 %] 95 % (11/04 1212)  Weight change:  Filed Weights   01/09/21 1352  Weight: 136.1 kg    Intake/Output: I/O last 3 completed shifts: In: 2557.7 [P.O.:690; I.V.:1432.2; IV Piggyback:435.5] Out: 1450 [Urine:1450]   Intake/Output this shift:  No intake/output data recorded.  Physical Exam: General: NAD, moaning  Head: Normocephalic, atraumatic. Moist oral mucosal membranes  Eyes: Anicteric  Lungs:  Clear to auscultation, normal effort  Heart: No rub  Abdomen:  Soft, nontender  Extremities:  trace peripheral edema.  Neurologic: Patient is aphasic  Skin: No lesions       Basic Metabolic Panel: Recent Labs  Lab 01/10/21 0009 01/10/21 0457 01/11/21 0606 01/12/21 0541 01/12/21 1200  NA 138 138 138 140 140  K 4.2 4.5 4.6 4.8 4.9  CL 104 106 108 107 108  CO2 22 20* 20* 21* 18*  GLUCOSE 80 68* 103* 118* 115*  BUN 42* 42* 56* 63* 65*  CREATININE 2.60* 2.78* 3.29* 4.14* 4.27*  CALCIUM 8.4* 8.2* 7.5* 7.6* 7.5*  MG  --  1.0* 1.5* 2.1  --     Liver Function Tests: Recent Labs  Lab 01/09/21 1417 01/10/21 0009 01/11/21 0606 01/12/21 0541 01/12/21 1200  AST 87* 181* 244* 149* 132*  ALT 59* 189* 215* 178* 164*  ALKPHOS 112 62 90 137* 140*  BILITOT 1.0 1.0 0.9 1.1 0.9  PROT 6.2* 5.8* 5.2* 6.0* 5.6*  ALBUMIN 2.9* 2.7* 2.2* 2.4* 2.4*   No results for input(s): LIPASE, AMYLASE in the last 168 hours. No results for input(s): AMMONIA in the last 168 hours.  CBC: Recent Labs  Lab 01/09/21 1417 01/10/21 0009 01/10/21 0457 01/11/21 0606  01/12/21 0541  WBC 21.2* 33.3* 27.7* 17.0* 16.3*  NEUTROABS 19.1* 28.9*  --   --   --   HGB 12.1 11.0* 10.6* 10.1* 10.3*  HCT 38.3 34.6* 32.9* 33.2* 33.1*  MCV 89.7 89.4 91.1 92.2 91.4  PLT 197 179 156 127* 161    Cardiac Enzymes: No results for input(s): CKTOTAL, CKMB, CKMBINDEX, TROPONINI in the last 168 hours.  BNP: Invalid input(s): POCBNP  CBG: Recent Labs  Lab 01/11/21 0757 01/11/21 1117 01/11/21 1633 01/12/21 0821 01/12/21 1222  GLUCAP 98 108* 134* 141* 115*    Microbiology: Results for orders placed or performed during the hospital encounter of 01/09/21  Resp Panel by RT-PCR (Flu A&B, Covid) Nasopharyngeal Swab     Status: None   Collection Time: 01/09/21  2:17 PM   Specimen: Nasopharyngeal Swab; Nasopharyngeal(NP) swabs in vial transport medium  Result Value Ref Range Status   SARS Coronavirus 2 by RT PCR NEGATIVE NEGATIVE Final    Comment: (NOTE) SARS-CoV-2 target nucleic acids are NOT DETECTED.  The SARS-CoV-2 RNA is generally detectable in upper respiratory specimens during the acute phase of infection. The lowest concentration of SARS-CoV-2 viral copies this assay can detect is 138 copies/mL. A negative result does not preclude SARS-Cov-2 infection and should not be used as the sole basis for treatment or other patient management decisions. A negative result may occur with  improper  specimen collection/handling, submission of specimen other than nasopharyngeal swab, presence of viral mutation(s) within the areas targeted by this assay, and inadequate number of viral copies(<138 copies/mL). A negative result must be combined with clinical observations, patient history, and epidemiological information. The expected result is Negative.  Fact Sheet for Patients:  EntrepreneurPulse.com.au  Fact Sheet for Healthcare Providers:  IncredibleEmployment.be  This test is no t yet approved or cleared by the [place] FDA  and  has been authorized for detection and/or diagnosis of SARS-CoV-2 by FDA under an Emergency Use Authorization (EUA). This EUA will remain  in effect (meaning this test can be used) for the duration of the COVID-19 declaration under Section 564(b)(1) of the Act, 21 U.S.C.section 360bbb-3(b)(1), unless the authorization is terminated  or revoked sooner.       Influenza A by PCR NEGATIVE NEGATIVE Final   Influenza B by PCR NEGATIVE NEGATIVE Final    Comment: (NOTE) The Xpert Xpress SARS-CoV-2/FLU/RSV plus assay is intended as an aid in the diagnosis of influenza from Nasopharyngeal swab specimens and should not be used as a sole basis for treatment. Nasal washings and aspirates are unacceptable for Xpert Xpress SARS-CoV-2/FLU/RSV testing.  Fact Sheet for Patients: EntrepreneurPulse.com.au  Fact Sheet for Healthcare Providers: IncredibleEmployment.be  This test is not yet approved or cleared by the [place] FDA and has been authorized for detection and/or diagnosis of SARS-CoV-2 by FDA under an Emergency Use Authorization (EUA). This EUA will remain in effect (meaning this test can be used) for the duration of the COVID-19 declaration under Section 564(b)(1) of the Act, 21 U.S.C. section 360bbb-3(b)(1), unless the authorization is terminated or revoked.  Performed at [facility], [address] [place], [place] [address]   Culture, blood (Routine X 2) w Reflex to ID Panel     Status: Abnormal (Preliminary result)   Collection Time: 01/10/21 12:09 AM   Specimen: BLOOD  Result Value Ref Range Status   Specimen Description   Final    BLOOD RIGHT FOREARM Performed at [facility], [address], [place], [place] [address]    Special Requests   Final    BOTTLES DRAWN AEROBIC AND ANAEROBIC Blood Culture results may not be optimal due to an excessive volume of blood received in culture bottles Performed at [facility], [address] [place], [place] [address]    Culture  Setup Time   Final    GRAM NEGATIVE RODS IN BOTH AEROBIC AND ANAEROBIC BOTTLES CRITICAL RESULT CALLED TO, READ BACK BY AND VERIFIED WITH: PHARMD S HALLHAI [id number] AT 815 AM BY CM Performed at [facility], [place] [address], [place], [place] [address]    Culture ESCHERICHIA COLI (A)  Final   Report Status PENDING  Incomplete  Culture, blood (Routine X 2) w Reflex to ID Panel     Status: Abnormal (Preliminary result)   Collection Time: 01/10/21 12:09 AM   Specimen: BLOOD  Result Value Ref Range Status   Specimen Description   Final    BLOOD LEFT ANTECUBITAL Performed at [facility], [place] [address], [place], [place] [address]    Special Requests   Final    BOTTLES DRAWN AEROBIC AND ANAEROBIC Blood Culture adequate volume Performed at [place] [place], [place] [address]    Culture  Setup Time   Final    GRAM NEGATIVE RODS IN BOTH AEROBIC AND ANAEROBIC BOTTLES CRITICAL RESULT CALLED TO, READ BACK BY AND  VERIFIED WITH: RAQUEL GOODMAN, PHARMD AT 1350 ON 01/10/21 BY GM    Culture (A)  Final    ESCHERICHIA COLI SUSCEPTIBILITIES TO FOLLOW Performed at [facility], [place] [address] [place], [place] [address]    Report Status PENDING  Incomplete  Blood Culture ID Panel (Reflexed)     Status: Abnormal   Collection Time: 01/10/21 12:09 AM  Result Value Ref Range Status   Enterococcus faecalis NOT DETECTED NOT DETECTED Final   Enterococcus Faecium NOT DETECTED NOT DETECTED Final   Listeria monocytogenes NOT DETECTED NOT DETECTED Final   Staphylococcus species NOT DETECTED NOT DETECTED Final   Staphylococcus aureus (BCID) NOT DETECTED NOT DETECTED Final   Staphylococcus epidermidis NOT DETECTED NOT DETECTED Final   Staphylococcus lugdunensis NOT DETECTED NOT DETECTED Final   Streptococcus species NOT DETECTED NOT DETECTED Final   Streptococcus agalactiae NOT DETECTED NOT  DETECTED Final   Streptococcus pneumoniae NOT DETECTED NOT DETECTED Final   Streptococcus pyogenes NOT DETECTED NOT DETECTED Final   A.calcoaceticus-baumannii NOT DETECTED NOT DETECTED Final   Bacteroides fragilis NOT DETECTED NOT DETECTED Final   Enterobacterales DETECTED (A) NOT DETECTED Final    Comment: Enterobacterales represent a large order of gram negative bacteria, not a single organism. CRITICAL RESULT CALLED TO, READ BACK BY AND VERIFIED WITH: [place], PHARMD AT 1350 ON 01/10/21 BY GM    Enterobacter cloacae complex NOT DETECTED NOT DETECTED Final   Escherichia coli DETECTED (A) NOT DETECTED Final    Comment: CRITICAL RESULT CALLED TO, READ BACK BY AND VERIFIED WITH: [place], PHARMD AT 1350 ON 01/10/21 BY GM    Klebsiella aerogenes NOT DETECTED NOT DETECTED Final   Klebsiella oxytoca NOT DETECTED NOT DETECTED Final   Klebsiella pneumoniae NOT DETECTED NOT DETECTED Final   Proteus species NOT DETECTED NOT DETECTED Final   Salmonella species NOT DETECTED NOT DETECTED Final   Serratia marcescens NOT DETECTED NOT DETECTED Final   Haemophilus influenzae NOT DETECTED NOT DETECTED Final   Neisseria meningitidis NOT DETECTED NOT DETECTED Final   Pseudomonas aeruginosa NOT DETECTED NOT DETECTED Final   Stenotrophomonas maltophilia NOT DETECTED NOT DETECTED Final   Candida albicans NOT DETECTED NOT DETECTED Final   Candida auris NOT DETECTED NOT DETECTED Final   Candida glabrata NOT DETECTED NOT DETECTED Final   Candida krusei NOT DETECTED NOT DETECTED Final   Candida parapsilosis NOT DETECTED NOT DETECTED Final   Candida tropicalis NOT DETECTED NOT DETECTED Final   Cryptococcus neoformans/gattii NOT DETECTED NOT DETECTED Final   CTX-M ESBL NOT DETECTED NOT DETECTED Final   Carbapenem resistance IMP NOT DETECTED NOT DETECTED Final   Carbapenem resistance KPC NOT DETECTED NOT DETECTED Final   Carbapenem resistance NDM NOT DETECTED NOT DETECTED Final   Carbapenem resist  OXA 48 LIKE NOT DETECTED NOT DETECTED Final   Carbapenem resistance VIM NOT DETECTED NOT DETECTED Final    Comment: Performed at [facility], [address] [place], [place] [address]  Urine Culture     Status: Abnormal   Collection Time: 01/10/21  1:05 PM   Specimen: Urine, Random  Result Value Ref Range Status   Specimen Description   Final    URINE, RANDOM Performed at [facility], [address] [place], [place] [address]    Special Requests   Final    NONE Performed at [facility], [address], [place], [place] [address]    Culture MULTIPLE SPECIES PRESENT, SUGGEST RECOLLECTION (A)  Final   Report  Status 01/11/2021 FINAL  Final    Coagulation Studies: Recent Labs    01/09/21 1417 01/10/21 0009  LABPROT 24.3* 29.6*  INR 2.2* 2.8*    Urinalysis: Recent Labs    01/09/21 1417  COLORURINE BROWN*  LABSPEC 1.017  PHURINE TEST NOT REPORTED DUE TO COLOR INTERFERENCE OF URINE PIGMENT  GLUCOSEU TEST NOT REPORTED DUE TO COLOR INTERFERENCE OF URINE PIGMENT*  HGBUR TEST NOT REPORTED DUE TO COLOR INTERFERENCE OF URINE PIGMENT*  BILIRUBINUR TEST NOT REPORTED DUE TO COLOR INTERFERENCE OF URINE PIGMENT*  KETONESUR TEST NOT REPORTED DUE TO COLOR INTERFERENCE OF URINE PIGMENT*  PROTEINUR TEST NOT REPORTED DUE TO COLOR INTERFERENCE OF URINE PIGMENT*  NITRITE TEST NOT REPORTED DUE TO COLOR INTERFERENCE OF URINE PIGMENT*  LEUKOCYTESUR TEST NOT REPORTED DUE TO COLOR INTERFERENCE OF URINE PIGMENT*      Imaging: US RENAL  Result Date: 01/10/2021 CLINICAL DATA:  UTI. EXAM: RENAL / URINARY TRACT ULTRASOUND COMPLETE COMPARISON:  None. FINDINGS: Right Kidney: Renal measurements: 11.5 cm x 6.1 cm x 6.0 cm = volume: 222 mL. Diffusely increased echogenicity of the renal parenchyma is noted. No mass or hydronephrosis visualized. Left Kidney: Renal measurements: 11.1 cm x 6.0 cm x 4.9 cm = volume: 172 mL. Echogenicity within normal limits. A 2.0 cm x 1.6 cm x  1.6 cm anechoic structure is seen within the upper pole of the left kidney. No hydronephrosis is visualized. Bladder: Appears normal for degree of bladder distention. Other: The study is limited secondary to the patient's body habitus. IMPRESSION: 1. Diffusely increased echogenicity of the right kidney which may be secondary to a diffuse infectious or inflammatory process. Medical renal disease cannot be excluded. 2. Small left renal cyst. Electronically Signed   By: Virgina Norfolk M.D.   On: 01/10/2021 15:46     Medications:    sodium chloride 75 mL/hr at 01/12/21 1021   cefTRIAXone (ROCEPHIN)  IV 2 g (01/12/21 1138)   levETIRAcetam 500 mg (01/12/21 1022)    amiodarone  200 mg Oral BID   Chlorhexidine Gluconate Cloth  6 each Topical Daily   enoxaparin (LOVENOX) injection  0.5 mg/kg Subcutaneous Q24H   insulin aspart  0-15 Units Subcutaneous TID WC   rosuvastatin  40 mg Oral QPM   ALPRAZolam, docusate sodium, ondansetron (ZOFRAN) IV, ondansetron, polyethylene glycol  Assessment/ Plan:  [name] is a 70 y.o.  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  Acute Kidney Injury on chronic kidney disease stage 3B with baseline creatinine 1.58 and GFR of 35 on 06/26/20.  Acute kidney injury secondary to sepsis Urinalysis on January 09, 2021: Turbid sample, greater than 50 RBCs, greater than 50 WBCs.  Urine culture negative Renal ultrasound shows Right kidney 11.5 cm, volume 222 mL, diffuse increased echogenicity.  Left kidney 11.1 cm, volume 172 mL, small left renal cyst No acute indication for dialysis at this time, Continue IVF. Avoid hypotension and nephrotoxic therapies. Will continue to monitor with treatment of underlying cause  Lab Results  Component Value Date   CREATININE 4.27 (H) 01/12/2021   CREATININE  4.14 (H) 01/12/2021   CREATININE 3.29 (H) 01/11/2021    Intake/Output Summary (Last 24 hours) at 01/12/2021 1340 Last data filed at 01/12/2021 0030 Gross per 24 hour  Intake 2017.66 ml  Output 750 ml  Net 1267.66 ml   2. Sepsis from  E. Coli bacteremia Blood cultures positive on 01/10/21 for Enterobacterials, E. Coli Currently on Rocephin   LOS: 3 [place] 11/4/20221:40 PM    Patient was examined and evaluated with Colon Flattery, NP.  Plan of care was formulated and discussed with NP.  I agree with the note as documented.

## 2021-01-12 NOTE — Progress Notes (Signed)
Date of Admission:  01/09/2021     ID: Sandra Massey is a 70 y.o. female Active Problems:   Speech abnormality   Sepsis ([place])    Subjective: Pt is distressed but cannot express herself Just shouting intermittently  Medications:   amiodarone  200 mg Oral BID   Chlorhexidine Gluconate Cloth  6 each Topical Daily   enoxaparin (LOVENOX) injection  0.5 mg/kg Subcutaneous Q24H   insulin aspart  0-15 Units Subcutaneous TID WC   rosuvastatin  40 mg Oral QPM    Objective: Vital signs in last 24 hours: Temp:  [97.7 F (36.5 C)-99 F (37.2 C)] 98.5 F (36.9 C) (11/04 0820) Pulse Rate:  [63-96] 96 (11/04 0820) Resp:  [17-20] 17 (11/04 0820) BP: (98-121)/(59-107) 109/86 (11/04 0820) SpO2:  [93 %-98 %] 98 % (11/04 0820)  PHYSICAL EXAM:  General: awake, but in distress Expressive aphasia  Lungs: b/l air entry rhonchi. Heart: irregular Abdomen: Soft, Extremities: edema extremities, rt > left  Skin: No rashes or lesions. Or bruising Lymph: Cervical, supraclavicular normal. Neurologic: rt hemiplegia  Lab Results Recent Labs    01/11/21 0606 01/12/21 0541  WBC 17.0* 16.3*  HGB 10.1* 10.3*  HCT 33.2* 33.1*  NA 138 140  K 4.6 4.8  CL 108 107  CO2 20* 21*  BUN 56* 63*  CREATININE 3.29* 4.14*   Liver Panel Recent Labs    01/11/21 0606 01/12/21 0541  PROT 5.2* 6.0*  ALBUMIN 2.2* 2.4*  AST 244* 149*  ALT 215* 178*  ALKPHOS 90 137*  BILITOT 0.9 1.1  BILIDIR 0.2  --   IBILI 0.7  --   Microbiology: 01/10/21 Ecoli Blood culture Studies/Results: US RENAL  Result Date: 01/10/2021 CLINICAL DATA:  UTI. EXAM: RENAL / URINARY TRACT ULTRASOUND COMPLETE COMPARISON:  None. FINDINGS: Right Kidney: Renal measurements: 11.5 cm x 6.1 cm x 6.0 cm = volume: 222 mL. Diffusely increased echogenicity of the renal parenchyma is noted. No mass or hydronephrosis visualized. Left Kidney: Renal measurements: 11.1 cm x 6.0 cm x 4.9 cm = volume: 172 mL. Echogenicity within normal limits. A 2.0  cm x 1.6 cm x 1.6 cm anechoic structure is seen within the upper pole of the left kidney. No hydronephrosis is visualized. Bladder: Appears normal for degree of bladder distention. Other: The study is limited secondary to the patient's body habitus. IMPRESSION: 1. Diffusely increased echogenicity of the right kidney which may be secondary to a diffuse infectious or inflammatory process. Medical renal disease cannot be excluded. 2. Small left renal cyst. Electronically Signed   By: Virgina Norfolk M.D.   On: 01/10/2021 15:46   EEG adult  Result Date: 01/10/2021 Sandra Jack, Massey     01/10/2021  1:42 PM Routine EEG Report Sandra Massey is a 70 y.o. female with a history of spells who is undergoing an EEG to evaluate for seizures. Report: This EEG was acquired with electrodes placed according to the International 10-20 electrode system (including Fp1, Fp2, F3, F4, C3, C4, P3, P4, O1, O2, T3, T4, T5, T6, A1, A2, Fz, Cz, Pz). The following electrodes were missing or displaced: none. The occipital dominant rhythm was 6 Hz. This activity is reactive to stimulation. Drowsiness was manifested by background fragmentation; deeper stages of sleep were identified by K complexes and sleep spindles. There was focal slowing over the left temporoparietal region. There were sharp waves noted occasionally (at times, frequently) in the left temporoparietal region. There were no electrographic seizures identified. Photic stimulation  and hyperventilation were not performed. Impression and clinical correlation: This EEG was obtained while awake and asleep and is abnormal due to: - Mild diffuse slowing, indicative of global cerebral dysfunction - Left temporoparietal slowing indicative of superimposed focal cerebral dysfunction in that area - Sharp waves located over the left temporoparietal region indicating increased epileptogenicity in that area There were no electrographic seizures observed during this recording. [name],  Massey [facility] [phone] If 7pm- 7am, please page neurology on call as listed in [place].     Assessment/Plan: E. coli bacteremia with UTI Rt pyelonephritis on [place] Continue ceftriaxone Recommend CT to look for other organs as she is tender but not able to express   Speech difficulty- no new stroke R/o TIA R/o Seizure   H/o Left MCA infarct with rt hemiplegia and expressive aphasia Left encephalomalacia  Worsening AKI Need to make sure she does not have urinary retention Followed by nephrology  Transaminitis- improving   Afib on amiodarone   DM on insulin  Rt leg  more swollen than left due to hemiplegia- need to r/o DVT   Discussed the management with care team ID will follow peripherally this weekend-

## 2021-01-13 ENCOUNTER — Inpatient Hospital Stay: Payer: Medicare HMO

## 2021-01-13 DIAGNOSIS — A403 Sepsis due to Streptococcus pneumoniae: Secondary | ICD-10-CM | POA: Diagnosis not present

## 2021-01-13 DIAGNOSIS — J9602 Acute respiratory failure with hypercapnia: Secondary | ICD-10-CM | POA: Diagnosis not present

## 2021-01-13 DIAGNOSIS — R6520 Severe sepsis without septic shock: Secondary | ICD-10-CM | POA: Diagnosis not present

## 2021-01-13 LAB — BASIC METABOLIC PANEL
Anion gap: 15 (ref 5–15)
BUN: 72 mg/dL — ABNORMAL HIGH (ref 8–23)
CO2: 16 mmol/L — ABNORMAL LOW (ref 22–32)
Calcium: 7.3 mg/dL — ABNORMAL LOW (ref 8.9–10.3)
Chloride: 108 mmol/L (ref 98–111)
Creatinine, Ser: 4.86 mg/dL — ABNORMAL HIGH (ref 0.44–1.00)
GFR, Estimated: 9 mL/min — ABNORMAL LOW (ref >60)
Glucose, Bld: 145 mg/dL — ABNORMAL HIGH (ref 70–99)
Potassium: 5 mmol/L (ref 3.5–5.1)
Sodium: 139 mmol/L (ref 135–145)

## 2021-01-13 LAB — BLOOD GAS, ARTERIAL
Acid-base deficit: 10.7 mmol/L — ABNORMAL HIGH (ref 0.0–2.0)
Bicarbonate: 15 mmol/L — ABNORMAL LOW (ref 20.0–28.0)
FIO2: 0.32
O2 Saturation: 89.4 %
Patient temperature: 37
pCO2 arterial: 32 mmHg (ref 32.0–48.0)
pH, Arterial: 7.28 — ABNORMAL LOW (ref 7.350–7.450)
pO2, Arterial: 65 mmHg — ABNORMAL LOW (ref 83.0–108.0)

## 2021-01-13 LAB — CULTURE, BLOOD (ROUTINE X 2): Special Requests: ADEQUATE

## 2021-01-13 LAB — CBC
HCT: 31.4 % — ABNORMAL LOW (ref 36.0–46.0)
Hemoglobin: 10 g/dL — ABNORMAL LOW (ref 12.0–15.0)
MCH: 28.7 pg (ref 26.0–34.0)
MCHC: 31.8 g/dL (ref 30.0–36.0)
MCV: 90.2 fL (ref 80.0–100.0)
Platelets: 154 10*3/uL (ref 150–400)
RBC: 3.48 MIL/uL — ABNORMAL LOW (ref 3.87–5.11)
RDW: 18.3 % — ABNORMAL HIGH (ref 11.5–15.5)
WBC: 12.7 10*3/uL — ABNORMAL HIGH (ref 4.0–10.5)
nRBC: 0 % (ref 0.0–0.2)

## 2021-01-13 LAB — CK: Total CK: 252 U/L — ABNORMAL HIGH (ref 38–234)

## 2021-01-13 LAB — GLUCOSE, CAPILLARY
Glucose-Capillary: 131 mg/dL — ABNORMAL HIGH (ref 70–99)
Glucose-Capillary: 88 mg/dL (ref 70–99)
Glucose-Capillary: 104 mg/dL — ABNORMAL HIGH (ref 70–99)

## 2021-01-13 LAB — MAGNESIUM: Magnesium: 2.2 mg/dL (ref 1.7–2.4)

## 2021-01-13 LAB — LACTIC ACID, PLASMA: Lactic Acid, Venous: 1.2 mmol/L (ref 0.5–1.9)

## 2021-01-13 LAB — PHOSPHORUS: Phosphorus: 5.8 mg/dL — ABNORMAL HIGH (ref 2.5–4.6)

## 2021-01-13 MED ORDER — STERILE WATER FOR INJECTION IV SOLN
INTRAVENOUS | Status: AC
  Filled 2021-01-13 (×3): qty 1000

## 2021-01-13 MED ORDER — METHOCARBAMOL 1000 MG/10ML IJ SOLN
500.0000 mg | Freq: Once | INTRAVENOUS | Status: AC
  Administered 2021-01-13: 500 mg via INTRAVENOUS
  Filled 2021-01-13: qty 5

## 2021-01-13 MED ORDER — ACETAMINOPHEN 160 MG/5ML PO SOLN
650.0000 mg | Freq: Four times a day (QID) | ORAL | Status: DC | PRN
  Administered 2021-01-13 – 2021-01-23 (×4): 650 mg via ORAL
  Filled 2021-01-13 (×5): qty 20.3

## 2021-01-13 NOTE — Progress Notes (Addendum)
[facility]  ROUNDING NOTE   Subjective:   Patient was seen today on second floor Patient remains confused and drowsy Patient is not able to offer any specific physical needs.    Objective:  Vital signs in last 24 hours:  Temp:  [97.2 F (36.2 C)-98.6 F (37 C)] 97.9 F (36.6 C) (11/05 1139) Pulse Rate:  [62-119] 77 (11/05 1139) Resp:  [17-20] 19 (11/05 1139) BP: (109-124)/(52-97) 124/97 (11/05 1139) SpO2:  [85 %-98 %] 98 % (11/05 1139)  Weight change:  Filed Weights   01/09/21 1352  Weight: 136.1 kg    Intake/Output: I/O last 3 completed shifts: In: 150 [P.O.:150] Out: 850 [Urine:850]   Intake/Output this shift:  No intake/output data recorded.  Physical Exam: General: Patient is moaning  Head: Normocephalic, atraumatic. Moist oral mucosal membranes  Eyes: Anicteric  Lungs:  Clear to auscultation, normal effort  Heart: No rub  Abdomen:  Soft, nontender  Extremities:  trace peripheral edema.  Neurologic: Patient is aphasic  Skin: No lesions       Basic Metabolic Panel: Recent Labs  Lab 01/10/21 0457 01/11/21 0606 01/12/21 0541 01/12/21 1200 01/13/21 0607  NA 138 138 140 140 139  K 4.5 4.6 4.8 4.9 5.0  CL 106 108 107 108 108  CO2 20* 20* 21* 18* 16*  GLUCOSE 68* 103* 118* 115* 145*  BUN 42* 56* 63* 65* 72*  CREATININE 2.78* 3.29* 4.14* 4.27* 4.86*  CALCIUM 8.2* 7.5* 7.6* 7.5* 7.3*  MG 1.0* 1.5* 2.1  --  2.2  PHOS  --   --   --   --  5.8*    Liver Function Tests: Recent Labs  Lab 01/09/21 1417 01/10/21 0009 01/11/21 0606 01/12/21 0541 01/12/21 1200  AST 87* 181* 244* 149* 132*  ALT 59* 189* 215* 178* 164*  ALKPHOS 112 62 90 137* 140*  BILITOT 1.0 1.0 0.9 1.1 0.9  PROT 6.2* 5.8* 5.2* 6.0* 5.6*  ALBUMIN 2.9* 2.7* 2.2* 2.4* 2.4*   No results for input(s): LIPASE, AMYLASE in the last 168 hours. No results for input(s): AMMONIA in the last 168 hours.  CBC: Recent Labs  Lab 01/09/21 1417 01/10/21 0009 01/10/21 0457  01/11/21 0606 01/12/21 0541 01/13/21 0607  WBC 21.2* 33.3* 27.7* 17.0* 16.3* 12.7*  NEUTROABS 19.1* 28.9*  --   --   --   --   HGB 12.1 11.0* 10.6* 10.1* 10.3* 10.0*  HCT 38.3 34.6* 32.9* 33.2* 33.1* 31.4*  MCV 89.7 89.4 91.1 92.2 91.4 90.2  PLT 197 179 156 127* 161 154    Cardiac Enzymes: Recent Labs  Lab 01/13/21 0607  CKTOTAL 252*    BNP: Invalid input(s): POCBNP  CBG: Recent Labs  Lab 01/12/21 1222 01/12/21 1550 01/12/21 2118 01/13/21 0755 01/13/21 1131  GLUCAP 115* 121* 159* 131* 88    Microbiology: Results for orders placed or performed during the hospital encounter of 01/09/21  Resp Panel by RT-PCR (Flu A&B, Covid) Nasopharyngeal Swab     Status: None   Collection Time: 01/09/21  2:17 PM   Specimen: Nasopharyngeal Swab; Nasopharyngeal(NP) swabs in vial transport medium  Result Value Ref Range Status   SARS Coronavirus 2 by RT PCR NEGATIVE NEGATIVE Final    Comment: (NOTE) SARS-CoV-2 target nucleic acids are NOT DETECTED.  The SARS-CoV-2 RNA is generally detectable in upper respiratory specimens during the acute phase of infection. The lowest concentration of SARS-CoV-2 viral copies this assay can detect is 138 copies/mL. A negative result does not preclude  SARS-Cov-2 infection and should not be used as the sole basis for treatment or other patient management decisions. A negative result may occur with  improper specimen collection/handling, submission of specimen other than nasopharyngeal swab, presence of viral mutation(s) within the areas targeted by this assay, and inadequate number of viral copies(<138 copies/mL). A negative result must be combined with clinical observations, patient history, and epidemiological information. The expected result is Negative.  Fact Sheet for Patients:  EntrepreneurPulse.com.au  Fact Sheet for Healthcare Providers:  IncredibleEmployment.be  This test is no t yet approved or  cleared by the [place] FDA and  has been authorized for detection and/or diagnosis of SARS-CoV-2 by FDA under an Emergency Use Authorization (EUA). This EUA will remain  in effect (meaning this test can be used) for the duration of the COVID-19 declaration under Section 564(b)(1) of the Act, 21 U.S.C.section 360bbb-3(b)(1), unless the authorization is terminated  or revoked sooner.       Influenza A by PCR NEGATIVE NEGATIVE Final   Influenza B by PCR NEGATIVE NEGATIVE Final    Comment: (NOTE) The Xpert Xpress SARS-CoV-2/FLU/RSV plus assay is intended as an aid in the diagnosis of influenza from Nasopharyngeal swab specimens and should not be used as a sole basis for treatment. Nasal washings and aspirates are unacceptable for Xpert Xpress SARS-CoV-2/FLU/RSV testing.  Fact Sheet for Patients: EntrepreneurPulse.com.au  Fact Sheet for Healthcare Providers: IncredibleEmployment.be  This test is not yet approved or cleared by the [place] FDA and has been authorized for detection and/or diagnosis of SARS-CoV-2 by FDA under an Emergency Use Authorization (EUA). This EUA will remain in effect (meaning this test can be used) for the duration of the COVID-19 declaration under Section 564(b)(1) of the Act, 21 U.S.C. section 360bbb-3(b)(1), unless the authorization is terminated or revoked.  Performed at [facility] [address] [place], [place] [address]   Culture, blood (Routine X 2) w Reflex to ID Panel     Status: Abnormal   Collection Time: 01/10/21 12:09 AM   Specimen: BLOOD  Result Value Ref Range Status   Specimen Description   Final    BLOOD RIGHT FOREARM Performed at [facility], [address] [place], [place] [address]    Special Requests   Final    BOTTLES DRAWN AEROBIC AND ANAEROBIC Blood Culture results may not be optimal due to an excessive volume of blood received in culture bottles Performed  at [facility], [address] [place], [place] [address]    Culture  Setup Time   Final    GRAM NEGATIVE RODS IN BOTH AEROBIC AND ANAEROBIC BOTTLES CRITICAL RESULT CALLED TO, READ BACK BY AND VERIFIED WITH: PHARMD S HALLHAI [id number] AT 815 AM BY CM    Culture (A)  Final    ESCHERICHIA COLI SUSCEPTIBILITIES PERFORMED ON PREVIOUS CULTURE WITHIN THE LAST 5 DAYS. Performed at [facility], [place] [address] [place], [place] [address]    Report Status 01/13/2021 FINAL  Final  Culture, blood (Routine X 2) w Reflex to ID Panel     Status: Abnormal   Collection Time: 01/10/21 12:09 AM   Specimen: BLOOD  Result Value Ref Range Status   Specimen Description   Final    BLOOD LEFT ANTECUBITAL Performed at [facility], [place] [address], [place], [place] [address]    Special Requests   Final    BOTTLES DRAWN AEROBIC AND ANAEROBIC Blood Culture adequate volume Performed at [facility],  [place], [place] [address]    Culture  Setup Time   Final    GRAM NEGATIVE RODS IN BOTH AEROBIC AND ANAEROBIC BOTTLES CRITICAL RESULT CALLED TO, READ BACK BY AND VERIFIED WITH: RAQUEL GOODMAN, PHARMD AT 1350 ON 01/10/21 BY GM Performed at [facility], [place] [address], [place], [place] [address]    Culture ESCHERICHIA COLI (A)  Final   Report Status 01/13/2021 FINAL  Final   Organism ID, Bacteria ESCHERICHIA COLI  Final      Susceptibility   Escherichia coli - MIC*    AMPICILLIN >=32 RESISTANT Resistant     CEFAZOLIN <=4 SENSITIVE Sensitive     CEFEPIME <=0.12 SENSITIVE Sensitive     CEFTAZIDIME <=1 SENSITIVE Sensitive     CEFTRIAXONE <=0.25 SENSITIVE Sensitive     CIPROFLOXACIN <=0.25 SENSITIVE Sensitive     GENTAMICIN 8 INTERMEDIATE Intermediate     IMIPENEM <=0.25 SENSITIVE Sensitive     TRIMETH/SULFA >=320 RESISTANT Resistant     AMPICILLIN/SULBACTAM >=32 RESISTANT Resistant     PIP/TAZO <=4 SENSITIVE Sensitive     * ESCHERICHIA COLI  Blood Culture  ID Panel (Reflexed)     Status: Abnormal   Collection Time: 01/10/21 12:09 AM  Result Value Ref Range Status   Enterococcus faecalis NOT DETECTED NOT DETECTED Final   Enterococcus Faecium NOT DETECTED NOT DETECTED Final   Listeria monocytogenes NOT DETECTED NOT DETECTED Final   Staphylococcus species NOT DETECTED NOT DETECTED Final   Staphylococcus aureus (BCID) NOT DETECTED NOT DETECTED Final   Staphylococcus epidermidis NOT DETECTED NOT DETECTED Final   Staphylococcus lugdunensis NOT DETECTED NOT DETECTED Final   Streptococcus species NOT DETECTED NOT DETECTED Final   Streptococcus agalactiae NOT DETECTED NOT DETECTED Final   Streptococcus pneumoniae NOT DETECTED NOT DETECTED Final   Streptococcus pyogenes NOT DETECTED NOT DETECTED Final   A.calcoaceticus-baumannii NOT DETECTED NOT DETECTED Final   Bacteroides fragilis NOT DETECTED NOT DETECTED Final   Enterobacterales DETECTED (A) NOT DETECTED Final    Comment: Enterobacterales represent a large order of gram negative bacteria, not a single organism. CRITICAL RESULT CALLED TO, READ BACK BY AND VERIFIED WITH: [place], PHARMD AT 1350 ON 01/10/21 BY GM    Enterobacter cloacae complex NOT DETECTED NOT DETECTED Final   Escherichia coli DETECTED (A) NOT DETECTED Final    Comment: CRITICAL RESULT CALLED TO, READ BACK BY AND VERIFIED WITH: [place], PHARMD AT 1350 ON 01/10/21 BY GM    Klebsiella aerogenes NOT DETECTED NOT DETECTED Final   Klebsiella oxytoca NOT DETECTED NOT DETECTED Final   Klebsiella pneumoniae NOT DETECTED NOT DETECTED Final   Proteus species NOT DETECTED NOT DETECTED Final   Salmonella species NOT DETECTED NOT DETECTED Final   Serratia marcescens NOT DETECTED NOT DETECTED Final   Haemophilus influenzae NOT DETECTED NOT DETECTED Final   Neisseria meningitidis NOT DETECTED NOT DETECTED Final   Pseudomonas aeruginosa NOT DETECTED NOT DETECTED Final   Stenotrophomonas maltophilia NOT DETECTED NOT DETECTED  Final   Candida albicans NOT DETECTED NOT DETECTED Final   Candida auris NOT DETECTED NOT DETECTED Final   Candida glabrata NOT DETECTED NOT DETECTED Final   Candida krusei NOT DETECTED NOT DETECTED Final   Candida parapsilosis NOT DETECTED NOT DETECTED Final   Candida tropicalis NOT DETECTED NOT DETECTED Final   Cryptococcus neoformans/gattii NOT DETECTED NOT DETECTED Final   CTX-M ESBL NOT DETECTED NOT DETECTED Final   Carbapenem resistance IMP NOT DETECTED NOT DETECTED Final   Carbapenem  resistance KPC NOT DETECTED NOT DETECTED Final   Carbapenem resistance NDM NOT DETECTED NOT DETECTED Final   Carbapenem resist OXA 48 LIKE NOT DETECTED NOT DETECTED Final   Carbapenem resistance VIM NOT DETECTED NOT DETECTED Final    Comment: Performed at [facility], [address], [place], [place] [address]  Urine Culture     Status: Abnormal   Collection Time: 01/10/21  1:05 PM   Specimen: Urine, Random  Result Value Ref Range Status   Specimen Description   Final    URINE, RANDOM Performed at [facility], [address] [place], [place] [address]    Special Requests   Final    NONE Performed at [facility], [address] [place], [place] [address]    Culture MULTIPLE SPECIES PRESENT, SUGGEST RECOLLECTION (A)  Final   Report Status 01/11/2021 FINAL  Final    Coagulation Studies: No results for input(s): LABPROT, INR in the last 72 hours.   Urinalysis: No results for input(s): COLORURINE, LABSPEC, PHURINE, GLUCOSEU, HGBUR, BILIRUBINUR, KETONESUR, PROTEINUR, UROBILINOGEN, NITRITE, LEUKOCYTESUR in the last 72 hours.  Invalid input(s): APPERANCEUR     Imaging: CT ABDOMEN PELVIS WO CONTRAST  Result Date: 01/12/2021 CLINICAL DATA:  E coli bacteremia. Acute on chronic kidney disease. Acute abdominal pain. EXAM: CT ABDOMEN AND PELVIS WITHOUT CONTRAST TECHNIQUE: Multidetector CT imaging of the abdomen and pelvis was performed following the standard  protocol without IV contrast. COMPARISON:  05/31/2019 FINDINGS: Body habitus reduces diagnostic sensitivity and specificity. Lower chest: Mild to moderate cardiomegaly. Small bilateral pleural effusions, right greater than left, with associated passive atelectasis. Hepatobiliary: Cholecystectomy.  No focal liver lesion identified. Pancreas: Unremarkable Spleen: Unremarkable Adrenals/Urinary Tract: Small amount of gas in the urinary bladder, query recent catheterization. Exophytic 2 cm lesion from the left kidney upper pole is fluid density and probably a cyst, similar to prior. No current hydronephrosis or hydroureter. 1.4 cm in long axis renal calculus in the right renal pelvis, previously in the right kidney lower pole. This is near the UPJ but is not associated with hydronephrosis. There is also a 2 mm right kidney lower pole calculus. 3 mm left kidney lower pole calculus similar to prior. No discrete ureteral or bladder calculus. Stomach/Bowel: Sigmoid colon diverticulosis without active diverticulitis identified. No dilated bowel. Vascular/Lymphatic: Atherosclerosis is present, including aortoiliac atherosclerotic disease. Reproductive: Unremarkable Other: Nonspecific presacral edema. Subcutaneous edema along the flanks and hip regions. Musculoskeletal: Old healed left lower rib fractures. Interval 30% compression fracture eccentric to the right at the L1 vertebral level with associated vertebral sclerosis. Fracture involves the inferior endplate and the posterior vertebral body margin indicating anterior and middle column involvement. No substantial degree of bony retropulsion. Grade 1 degenerative anterolisthesis at L4-5 is chronic. Thoracic and lumbar spondylosis noted. IMPRESSION: 1. 1.4 cm in long axis right renal calculus has migrated to the right renal pelvis. This sits next to UPJ but is not causing hydronephrosis. Additional 2 mm right and 3 mm left kidney lower pole calculi likewise appear  nonobstructive. 2. Sigmoid colon diverticulosis is present without definite active diverticulitis. 3. Interval 30% compression fracture at L1 with anterior and middle column involvement. No significant bony retropulsion. 4. Mild to moderate cardiomegaly with small bilateral pleural effusions, right greater than left. 5. Subcutaneous edema along the flanks and hips, along with some nonspecific edema in the presacral space probably reflecting mild third spacing of fluids. Electronically Signed   By: Van Clines M.D.   On: 01/12/2021 17:23  Medications:    cefTRIAXone (ROCEPHIN)  IV 2 g (01/13/21 0933)   levETIRAcetam 500 mg (01/13/21 0916)    sodium bicarbonate (isotonic) infusion in sterile water 125 mL/hr at 01/13/21 1057    amiodarone  200 mg Oral BID   apixaban  5 mg Oral BID   Chlorhexidine Gluconate Cloth  6 each Topical Daily   insulin aspart  0-15 Units Subcutaneous TID WC   rosuvastatin  40 mg Oral QPM   acetaminophen (TYLENOL) oral liquid 160 mg/5 mL, ALPRAZolam, docusate sodium, ondansetron (ZOFRAN) IV, ondansetron, polyethylene glycol  Assessment/ Plan:  Sandra Massey is a 70 y.o.  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 Patient AKI is now worsening. Patient was admitted with creatinine of 2.2, patient creatinine is now at 4.86 Patient potassium is trending up.  Patient was admitted with a potassium of 3.5 has now come up to 5.0 Patient has become more acidotic.   Patient bicarb was 22 on November 2 has now come down to 16 Patient has also become oliguric. Patient urine output has decreased from November 3 when patient had more than a liter urine output to now 400 yesterday   I discussed with the patient and the family about possible need for renal replacement therapy soon   2)HTN    Blood pressure is stable    3)Anemia of chronic disease  CBC Latest Ref Rng & Units 01/13/2021 01/12/2021 01/11/2021  WBC 4.0 - 10.5 K/uL 12.7(H) 16.3(H) 17.0(H)  Hemoglobin 12.0 - 15.0 g/dL 10.0(L) 10.3(L) 10.1(L)  Hematocrit 36.0 - 46.0 % 31.4(L) 33.1(L) 33.2(L)  Platelets 150 - 400 K/uL 154 161 127(L)       HGb at goal (9--11)   4) Hyperphosphatemia Patient phosphorus on the higher side This is most likely secondary to renal failure associated hyperphosphatemia We will hold off on starting binders for now   Lab Results  Component Value Date   CALCIUM 7.3 (L) 01/13/2021   PHOS 5.8 (H) 01/13/2021      5)Sepsis Patient has E. coli bacteremia Patient currently on IV antibiotics Patient WBC counts are trending down  6) Electrolytes   BMP Latest Ref Rng & Units 01/13/2021 01/12/2021 01/12/2021  Glucose 70 - 99 mg/dL 145(H) 115(H) 118(H)  BUN 8 - 23 mg/dL 72(H) 65(H) 63(H)  Creatinine 0.44 - 1.00 mg/dL 4.86(H) 4.27(H) 4.14(H)  Sodium 135 - 145 mmol/L 139 140 140  Potassium 3.5 - 5.1 mmol/L 5.0 4.9 4.8  Chloride 98 - 111 mmol/L 108 108 107  CO2 22 - 32 mmol/L 16(L) 18(L) 21(L)  Calcium 8.9 - 10.3 mg/dL 7.3(L) 7.5(L) 7.6(L)     Sodium Normonatremic   Potassium Normokalemic    7)Acid base  Anion gap is equal to 139-124 is equal to 15 Delta anion gap 15-9 is equal to 6 Delta bicarb 24-16 is equal to 8  Patient has both high anion gap acidosis and non-anion gap acidosis Will ask for serum lactate  Co2 is not at goal  We will ask for ABG  Agree with IV bicarb   Plan  We will ask for ABG We will ask for serum lactate We will  hold off on starting binders We will hold off on starting Epogen We will ask for chest x-ray  Depending upon the volume status/acidosis we will decide about if patient needs renal placement therapy .   Addendum Pt ABG does show acidosis Pt does not have lactic acidosis CXR shows congestion ( formal report is pending) Will reduce IVF rate          LOS: 4 Richele Strand s Cree [place] 11/5/20221:29 PM

## 2021-01-13 NOTE — Progress Notes (Addendum)
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  Head CT did not show any findings, neurology consult was obtained who recommended a stroke work-up EEG showed evidence of epileptiform waves arising in the left posterior temporal region.  Patient was started on Keppra 500 mg twice a day..  Patient became hypotensive requiring IV fluid blood cultures grew E. coli she was started on ceftriaxone IV nephrology was consulted for acute on chronic kidney disease, infectious disease was also consulted for the E. coli bacteremia to guide antibiotic treatment    Subjective January 13, 2021 Patient seen and examined at bedside Patient was looking quite altered patient had been wiggling around throwing off her clothes and removing her oxygen   Assessment/Plan: Active Problems:   Speech abnormality   Sepsis ([place])  Expressive aphasia: Patient has a history of left MCA territory infarct with right sided hemiparesis and she was noted to have change in speech with cognitive deficit Nephrology consulted stroke work-up was started Patient is on [facility] following     Severe sepsis /E. coli bacteremia:  Patient with E. coli bacteremia Infectious disease consulted they are in agreement with ceftriaxone     Acute kidney injury on CKD stage IIIb.  Her baseline creatinine is 1.58 Her admission creatinine was 2.25 Suspect this is due to sepsis Will continue IV hydration Continue to avoid nephrotoxic medication Nephrology was consulted     Elevated troponin: 1 patient denies any chest pain Patient  has mildly elevated trop at baseline, ~100's.   Chronic A. fib on Eliquis: Continue amiodarone Need to monitor LFT in light of amiodarone use Continue Eliquis  Essential hypertension. Blood Pressure is on soft side. Hold amlodipine.   Type 2 diabetes Continue Accu-Chek with SSI coverage   Hypomagnesemia: Replaced, corrected  continue to monitor.   Elevated INR Elevated liver enzymes Unclear etiology.  No history of cirrhosis Monitor for now.   Cognitive deficits Could be secondary to dementia.  Acid-base imbalance Likely due to chronic kidney disease Bicarb infusion started Will monitor   Code Status: DNR  Severity of Illness: The appropriate patient status for this patient is INPATIENT. Inpatient status is judged to be reasonable and necessary in order to provide the required intensity of service to ensure the patient's safety. The patient's presenting symptoms, physical exam findings, and initial radiographic and laboratory data in the context of their chronic comorbidities is felt to place them at high risk for further clinical deterioration. Furthermore, it is not anticipated that the patient will be medically stable for discharge from the hospital within 2 midnights of admission.  Altered mental status hypoxia to be determined * I certify that at the point of admission it is my clinical judgment that the patient will require inpatient hospital care spanning beyond 2 midnights from the point of admission due to high intensity of service, high risk for further deterioration and high frequency of surveillance required.*   Family Communication: None at bedside  Disposition Plan: To be determined Status is: Inpatient   Dispo: The patient is from: Home              Anticipated d/c is to:  Anticipated d/c date is:               Patient currently not medically stable for discharge  Consultants: Nephrology Infectious  disease  Procedures: None  Antimicrobials: Ceftriaxone  DVT prophylaxis: Apixaban  Objective: Vitals:   01/12/21 2019 01/13/21 0050 01/13/21 0447 01/13/21 0816  BP: 114/69 120/70 124/81 (!) 117/52  Pulse: 95 62 95 80  Resp: 17 18 18 20   Temp: 98.3 F (36.8 C) 98.1 F (36.7 C) (!) 97.2 F (36.2 C) 97.9 F (36.6 C)  TempSrc:      SpO2: 98% 98% 95% 94%  Weight:      Height:       No intake or output data in the 24 hours ending 01/13/21 0831 Filed Weights   01/09/21 1352  Weight: 136.1 kg   Body mass index is 46.99 kg/m.  Exam:  General: 70 y.o. year-old female well developed well nourished in no acute distress.  Drowsy Cardiovascular: Regular rate and rhythm with no rubs or gallops.  No thyromegaly or JVD noted.   Respiratory: Clear to auscultation with no wheezes or rales. Good inspiratory effort. Abdomen: Soft nontender nondistended with normal bowel sounds x4 quadrants. Musculoskeletal: No lower extremity edema. 2/4 pulses in all 4 extremities. Skin: No ulcerative lesions noted or rashes, Psychiatry: Mood is appropriate for condition and setting Neurology: Patient is drowsy and not really making conversation he mumbles something spoken to    Data Reviewed: CBC: Recent Labs  Lab 01/09/21 1417 01/10/21 0009 01/10/21 0457 01/11/21 0606 01/12/21 0541 01/13/21 0607  WBC 21.2* 33.3* 27.7* 17.0* 16.3* 12.7*  NEUTROABS 19.1* 28.9*  --   --   --   --   HGB 12.1 11.0* 10.6* 10.1* 10.3* 10.0*  HCT 38.3 34.6* 32.9* 33.2* 33.1* 31.4*  MCV 89.7 89.4 91.1 92.2 91.4 90.2  PLT 197 179 156 127* 161 759   Basic Metabolic Panel: Recent Labs  Lab 01/10/21 0457 01/11/21 0606 01/12/21 0541 01/12/21 1200 01/13/21 0607  NA 138 138 140 140 139  K 4.5 4.6 4.8 4.9 5.0  CL 106 108 107 108 108  CO2 20* 20* 21* 18* 16*  GLUCOSE 68* 103* 118* 115* 145*  BUN 42* 56* 63* 65* 72*  CREATININE 2.78* 3.29* 4.14* 4.27* 4.86*  CALCIUM 8.2* 7.5* 7.6* 7.5* 7.3*  MG 1.0* 1.5*  2.1  --  2.2  PHOS  --   --   --   --  5.8*   GFR: Estimated Creatinine Clearance: 16 mL/min (A) (by C-G formula based on SCr of 4.86 mg/dL (H)). Liver Function Tests: Recent Labs  Lab 01/09/21 1417 01/10/21 0009 01/11/21 0606 01/12/21 0541 01/12/21 1200  AST 87* 181* 244* 149* 132*  ALT 59* 189* 215* 178* 164*  ALKPHOS 112 62 90 137* 140*  BILITOT 1.0 1.0 0.9 1.1 0.9  PROT 6.2* 5.8* 5.2* 6.0* 5.6*  ALBUMIN 2.9* 2.7* 2.2* 2.4* 2.4*   No results for input(s): LIPASE, AMYLASE in the last 168 hours. No results for input(s): AMMONIA in the last 168 hours. Coagulation Profile: Recent Labs  Lab 01/09/21 1417 01/10/21 0009  INR 2.2* 2.8*   Cardiac Enzymes: Recent Labs  Lab 01/13/21 0607  CKTOTAL 252*   BNP (last 3 results) No results for input(s): PROBNP in the last 8760 hours. HbA1C: No results for input(s): HGBA1C in the last 72 hours. CBG: Recent Labs  Lab 01/12/21 0821 01/12/21 1222 01/12/21 1550 01/12/21 2118 01/13/21 0755  GLUCAP 141* 115* 121* 159*  131*   Lipid Profile: No results for input(s): CHOL, HDL, LDLCALC, TRIG, CHOLHDL, LDLDIRECT in the last 72 hours. Thyroid Function Tests: No results for input(s): TSH, T4TOTAL, FREET4, T3FREE, THYROIDAB in the last 72 hours. Anemia Panel: No results for input(s): VITAMINB12, FOLATE, FERRITIN, TIBC, IRON, RETICCTPCT in the last 72 hours. Urine analysis:    Component Value Date/Time   COLORURINE BROWN (A) 01/09/2021 1417   APPEARANCEUR TURBID (A) 01/09/2021 1417   LABSPEC 1.017 01/09/2021 1417   PHURINE  01/09/2021 1417    TEST NOT REPORTED DUE TO COLOR INTERFERENCE OF URINE PIGMENT   GLUCOSEU (A) 01/09/2021 1417    TEST NOT REPORTED DUE TO COLOR INTERFERENCE OF URINE PIGMENT   HGBUR (A) 01/09/2021 1417    TEST NOT REPORTED DUE TO COLOR INTERFERENCE OF URINE PIGMENT   BILIRUBINUR (A) 01/09/2021 1417    TEST NOT REPORTED DUE TO COLOR INTERFERENCE OF URINE PIGMENT   KETONESUR (A) 01/09/2021 1417    TEST NOT  REPORTED DUE TO COLOR INTERFERENCE OF URINE PIGMENT   PROTEINUR (A) 01/09/2021 1417    TEST NOT REPORTED DUE TO COLOR INTERFERENCE OF URINE PIGMENT   NITRITE (A) 01/09/2021 1417    TEST NOT REPORTED DUE TO COLOR INTERFERENCE OF URINE PIGMENT   LEUKOCYTESUR (A) 01/09/2021 1417    TEST NOT REPORTED DUE TO COLOR INTERFERENCE OF URINE PIGMENT   Sepsis Labs: @LABRCNTIP (procalcitonin:4,lacticidven:4)  ) Recent Results (from the past 240 hour(s))  Resp Panel by RT-PCR (Flu A&B, Covid) Nasopharyngeal Swab     Status: None   Collection Time: 01/09/21  2:17 PM   Specimen: Nasopharyngeal Swab; Nasopharyngeal(NP) swabs in vial transport medium  Result Value Ref Range Status   SARS Coronavirus 2 by RT PCR NEGATIVE NEGATIVE Final    Comment: (NOTE) SARS-CoV-2 target nucleic acids are NOT DETECTED.  The SARS-CoV-2 RNA is generally detectable in upper respiratory specimens during the acute phase of infection. The lowest concentration of SARS-CoV-2 viral copies this assay can detect is 138 copies/mL. A negative result does not preclude SARS-Cov-2 infection and should not be used as the sole basis for treatment or other patient management decisions. A negative result may occur with  improper specimen collection/handling, submission of specimen other than nasopharyngeal swab, presence of viral mutation(s) within the areas targeted by this assay, and inadequate number of viral copies(<138 copies/mL). A negative result must be combined with clinical observations, patient history, and epidemiological information. The expected result is Negative.  Fact Sheet for Patients:  EntrepreneurPulse.com.au  Fact Sheet for Healthcare Providers:  IncredibleEmployment.be  This test is no t yet approved or cleared by the [place] FDA and  has been authorized for detection and/or diagnosis of SARS-CoV-2 by FDA under an Emergency Use Authorization (EUA). This EUA will  remain  in effect (meaning this test can be used) for the duration of the COVID-19 declaration under Section 564(b)(1) of the Act, 21 U.S.C.section 360bbb-3(b)(1), unless the authorization is terminated  or revoked sooner.       Influenza A by PCR NEGATIVE NEGATIVE Final   Influenza B by PCR NEGATIVE NEGATIVE Final    Comment: (NOTE) The Xpert Xpress SARS-CoV-2/FLU/RSV plus assay is intended as an aid in the diagnosis of influenza from Nasopharyngeal swab specimens and should not be used as a sole basis for treatment. Nasal washings and aspirates are unacceptable for Xpert Xpress SARS-CoV-2/FLU/RSV testing.  Fact Sheet for Patients: EntrepreneurPulse.com.au  Fact Sheet for Healthcare Providers: IncredibleEmployment.be  This test is not yet approved or  cleared by the [place] and has been authorized for detection and/or diagnosis of SARS-CoV-2 by FDA under an Emergency Use Authorization (EUA). This EUA will remain in effect (meaning this test can be used) for the duration of the COVID-19 declaration under Section 564(b)(1) of the Act, 21 U.S.C. section 360bbb-3(b)(1), unless the authorization is terminated or revoked.  Performed at [facility], [address] [place], [place] [address]   Culture, blood (Routine X 2) w Reflex to ID Panel     Status: Abnormal (Preliminary result)   Collection Time: 01/10/21 12:09 AM   Specimen: BLOOD  Result Value Ref Range Status   Specimen Description   Final    BLOOD RIGHT FOREARM Performed at [facility], [address], [place], [place] [address]    Special Requests   Final    BOTTLES DRAWN AEROBIC AND ANAEROBIC Blood Culture results may not be optimal due to an excessive volume of blood received in culture bottles Performed at [facility], [address] [place], [place] [address]    Culture  Setup Time   Final    GRAM NEGATIVE RODS IN BOTH AEROBIC AND  ANAEROBIC BOTTLES CRITICAL RESULT CALLED TO, READ BACK BY AND VERIFIED WITH: PHARMD S HALLHAI [id number] AT 815 AM BY CM Performed at [facility], [place] [address], [place], [place] [address]    Culture ESCHERICHIA COLI (A)  Final   Report Status PENDING  Incomplete  Culture, blood (Routine X 2) w Reflex to ID Panel     Status: Abnormal (Preliminary result)   Collection Time: 01/10/21 12:09 AM   Specimen: BLOOD  Result Value Ref Range Status   Specimen Description   Final    BLOOD LEFT ANTECUBITAL Performed at [facility], [place] [address], [place], [place] [address]    Special Requests   Final    BOTTLES DRAWN AEROBIC AND ANAEROBIC Blood Culture adequate volume Performed at [address] [place], [place] [address]    Culture  Setup Time   Final    GRAM NEGATIVE RODS IN BOTH AEROBIC AND ANAEROBIC BOTTLES CRITICAL RESULT CALLED TO, READ BACK BY AND VERIFIED WITH: [place], PHARMD AT 1350 ON 01/10/21 BY GM    Culture (A)  Final    ESCHERICHIA COLI SUSCEPTIBILITIES TO FOLLOW Performed at [facility], [place] [address], [place], [place] [address]    Report Status PENDING  Incomplete  Blood Culture ID Panel (Reflexed)     Status: Abnormal   Collection Time: 01/10/21 12:09 AM  Result Value Ref Range Status   Enterococcus faecalis NOT DETECTED NOT DETECTED Final   Enterococcus Faecium NOT DETECTED NOT DETECTED Final   Listeria monocytogenes NOT DETECTED NOT DETECTED Final   Staphylococcus species NOT DETECTED NOT DETECTED Final   Staphylococcus aureus (BCID) NOT DETECTED NOT DETECTED Final   Staphylococcus epidermidis NOT DETECTED NOT DETECTED Final   Staphylococcus lugdunensis NOT DETECTED NOT DETECTED Final   Streptococcus species NOT DETECTED NOT DETECTED Final   Streptococcus agalactiae NOT DETECTED NOT DETECTED Final   Streptococcus pneumoniae NOT DETECTED NOT DETECTED Final   Streptococcus pyogenes NOT DETECTED NOT DETECTED Final    A.calcoaceticus-baumannii NOT DETECTED NOT DETECTED Final   Bacteroides fragilis NOT DETECTED NOT DETECTED Final   Enterobacterales DETECTED (A) NOT DETECTED Final    Comment: Enterobacterales represent a large order of gram negative bacteria, not a single organism. CRITICAL RESULT CALLED TO, READ BACK BY AND VERIFIED  WITH: RAQUEL GOODMAN, PHARMD AT 1350 ON 01/10/21 BY GM    Enterobacter cloacae complex NOT DETECTED NOT DETECTED Final   Escherichia coli DETECTED (A) NOT DETECTED Final    Comment: CRITICAL RESULT CALLED TO, READ BACK BY AND VERIFIED WITH: [place], PHARMD AT 1350 ON 01/10/21 BY GM    Klebsiella aerogenes NOT DETECTED NOT DETECTED Final   Klebsiella oxytoca NOT DETECTED NOT DETECTED Final   Klebsiella pneumoniae NOT DETECTED NOT DETECTED Final   Proteus species NOT DETECTED NOT DETECTED Final   Salmonella species NOT DETECTED NOT DETECTED Final   Serratia marcescens NOT DETECTED NOT DETECTED Final   Haemophilus influenzae NOT DETECTED NOT DETECTED Final   Neisseria meningitidis NOT DETECTED NOT DETECTED Final   Pseudomonas aeruginosa NOT DETECTED NOT DETECTED Final   Stenotrophomonas maltophilia NOT DETECTED NOT DETECTED Final   Candida albicans NOT DETECTED NOT DETECTED Final   Candida auris NOT DETECTED NOT DETECTED Final   Candida glabrata NOT DETECTED NOT DETECTED Final   Candida krusei NOT DETECTED NOT DETECTED Final   Candida parapsilosis NOT DETECTED NOT DETECTED Final   Candida tropicalis NOT DETECTED NOT DETECTED Final   Cryptococcus neoformans/gattii NOT DETECTED NOT DETECTED Final   CTX-M ESBL NOT DETECTED NOT DETECTED Final   Carbapenem resistance IMP NOT DETECTED NOT DETECTED Final   Carbapenem resistance KPC NOT DETECTED NOT DETECTED Final   Carbapenem resistance NDM NOT DETECTED NOT DETECTED Final   Carbapenem resist OXA 48 LIKE NOT DETECTED NOT DETECTED Final   Carbapenem resistance VIM NOT DETECTED NOT DETECTED Final    Comment: Performed at  [facility], [address], [place], [place] [address]  Urine Culture     Status: Abnormal   Collection Time: 01/10/21  1:05 PM   Specimen: Urine, Random  Result Value Ref Range Status   Specimen Description   Final    URINE, RANDOM Performed at [facility], [address], [place], [place] [address]    Special Requests   Final    NONE Performed at [facility], [address] [place],  [address]    Culture MULTIPLE SPECIES PRESENT, SUGGEST RECOLLECTION (A)  Final   Report Status 01/11/2021 FINAL  Final      Studies: CT ABDOMEN PELVIS WO CONTRAST  Result Date: 01/12/2021 CLINICAL DATA:  E coli bacteremia. Acute on chronic kidney disease. Acute abdominal pain. EXAM: CT ABDOMEN AND PELVIS WITHOUT CONTRAST TECHNIQUE: Multidetector CT imaging of the abdomen and pelvis was performed following the standard protocol without IV contrast. COMPARISON:  05/31/2019 FINDINGS: Body habitus reduces diagnostic sensitivity and specificity. Lower chest: Mild to moderate cardiomegaly. Small bilateral pleural effusions, right greater than left, with associated passive atelectasis. Hepatobiliary: Cholecystectomy.  No focal liver lesion identified. Pancreas: Unremarkable Spleen: Unremarkable Adrenals/Urinary Tract: Small amount of gas in the urinary bladder, query recent catheterization. Exophytic 2 cm lesion from the left kidney upper pole is fluid density and probably a cyst, similar to prior. No current hydronephrosis or hydroureter. 1.4 cm in long axis renal calculus in the right renal pelvis, previously in the right kidney lower pole. This is near the UPJ but is not associated with hydronephrosis. There is also a 2 mm right kidney lower pole calculus. 3 mm left kidney lower pole calculus similar to prior. No discrete ureteral or bladder calculus. Stomach/Bowel: Sigmoid colon diverticulosis without active diverticulitis identified. No dilated bowel. Vascular/Lymphatic:  Atherosclerosis is present, including aortoiliac atherosclerotic disease. Reproductive: Unremarkable Other: Nonspecific presacral edema. Subcutaneous edema along  the flanks and hip regions. Musculoskeletal: Old healed left lower rib fractures. Interval 30% compression fracture eccentric to the right at the L1 vertebral level with associated vertebral sclerosis. Fracture involves the inferior endplate and the posterior vertebral body margin indicating anterior and middle column involvement. No substantial degree of bony retropulsion. Grade 1 degenerative anterolisthesis at L4-5 is chronic. Thoracic and lumbar spondylosis noted. IMPRESSION: 1. 1.4 cm in long axis right renal calculus has migrated to the right renal pelvis. This sits next to UPJ but is not causing hydronephrosis. Additional 2 mm right and 3 mm left kidney lower pole calculi likewise appear nonobstructive. 2. Sigmoid colon diverticulosis is present without definite active diverticulitis. 3. Interval 30% compression fracture at L1 with anterior and middle column involvement. No significant bony retropulsion. 4. Mild to moderate cardiomegaly with small bilateral pleural effusions, right greater than left. 5. Subcutaneous edema along the flanks and hips, along with some nonspecific edema in the presacral space probably reflecting mild third spacing of fluids. Electronically Signed   By: Van Clines M.D.   On: 01/12/2021 17:23    Scheduled Meds:  amiodarone  200 mg Oral BID   apixaban  5 mg Oral BID   Chlorhexidine Gluconate Cloth  6 each Topical Daily   insulin aspart  0-15 Units Subcutaneous TID WC   rosuvastatin  40 mg Oral QPM    Continuous Infusions:  cefTRIAXone (ROCEPHIN)  IV 2 g (01/12/21 1138)   levETIRAcetam 500 mg (01/12/21 2100)    sodium bicarbonate (isotonic) infusion in sterile water       LOS: 4 days     Cristal Deer, MD [facility]  To reach me or the doctor on call, go to: [email or web address] Password  [facility]  01/13/2021, 8:31 AM

## 2021-01-13 NOTE — Progress Notes (Addendum)
Meds crushed and attempted to administer to pt but pt unable to follow commands to safely take PO meds or drink, therefore morning were nit administered. When asked to take a sip or swallow, pt states "I can't". Screaming out in pain but can not voice concerns. MD made aware via secure chat/No new orders at this time. Will continue to monitor.

## 2021-01-14 DIAGNOSIS — A403 Sepsis due to Streptococcus pneumoniae: Secondary | ICD-10-CM | POA: Diagnosis not present

## 2021-01-14 DIAGNOSIS — J9602 Acute respiratory failure with hypercapnia: Secondary | ICD-10-CM | POA: Diagnosis not present

## 2021-01-14 DIAGNOSIS — R6520 Severe sepsis without septic shock: Secondary | ICD-10-CM | POA: Diagnosis not present

## 2021-01-14 LAB — CBC
HCT: 31.5 % — ABNORMAL LOW (ref 36.0–46.0)
Hemoglobin: 9.9 g/dL — ABNORMAL LOW (ref 12.0–15.0)
MCH: 27.6 pg (ref 26.0–34.0)
MCHC: 31.4 g/dL (ref 30.0–36.0)
MCV: 87.7 fL (ref 80.0–100.0)
Platelets: 184 10*3/uL (ref 150–400)
RBC: 3.59 MIL/uL — ABNORMAL LOW (ref 3.87–5.11)
RDW: 18.6 % — ABNORMAL HIGH (ref 11.5–15.5)
WBC: 9.7 10*3/uL (ref 4.0–10.5)
nRBC: 0 % (ref 0.0–0.2)

## 2021-01-14 LAB — GLUCOSE, CAPILLARY
Glucose-Capillary: 137 mg/dL — ABNORMAL HIGH (ref 70–99)
Glucose-Capillary: 89 mg/dL (ref 70–99)
Glucose-Capillary: 99 mg/dL (ref 70–99)
Glucose-Capillary: 111 mg/dL — ABNORMAL HIGH (ref 70–99)
Glucose-Capillary: 92 mg/dL (ref 70–99)

## 2021-01-14 LAB — BLOOD GAS, ARTERIAL
Acid-base deficit: 8.7 mmol/L — ABNORMAL HIGH (ref 0.0–2.0)
Bicarbonate: 16.6 mmol/L — ABNORMAL LOW (ref 20.0–28.0)
FIO2: 0.32
O2 Saturation: 91.7 %
Patient temperature: 37
pCO2 arterial: 33 mmHg (ref 32.0–48.0)
pH, Arterial: 7.31 — ABNORMAL LOW (ref 7.350–7.450)
pO2, Arterial: 69 mmHg — ABNORMAL LOW (ref 83.0–108.0)

## 2021-01-14 LAB — BASIC METABOLIC PANEL
Anion gap: 19 — ABNORMAL HIGH (ref 5–15)
BUN: 74 mg/dL — ABNORMAL HIGH (ref 8–23)
CO2: 14 mmol/L — ABNORMAL LOW (ref 22–32)
Calcium: 7.2 mg/dL — ABNORMAL LOW (ref 8.9–10.3)
Chloride: 106 mmol/L (ref 98–111)
Creatinine, Ser: 5.35 mg/dL — ABNORMAL HIGH (ref 0.44–1.00)
GFR, Estimated: 8 mL/min — ABNORMAL LOW (ref >60)
Glucose, Bld: 127 mg/dL — ABNORMAL HIGH (ref 70–99)
Potassium: 5.2 mmol/L — ABNORMAL HIGH (ref 3.5–5.1)
Sodium: 139 mmol/L (ref 135–145)

## 2021-01-14 LAB — MAGNESIUM: Magnesium: 2.5 mg/dL — ABNORMAL HIGH (ref 1.7–2.4)

## 2021-01-14 MED ORDER — HEPARIN BOLUS VIA INFUSION: 5000.0000 [IU] | Freq: Once | INTRAVENOUS | Status: DC

## 2021-01-14 MED ORDER — SODIUM ZIRCONIUM CYCLOSILICATE 10 G PO PACK
10.0000 g | PACK | Freq: Once | ORAL | Status: DC
  Filled 2021-01-14: qty 1

## 2021-01-14 MED ORDER — CHLORHEXIDINE GLUCONATE CLOTH 2 % EX PADS: 6.0000 | MEDICATED_PAD | Freq: Every day | CUTANEOUS | Status: DC

## 2021-01-14 MED ORDER — HEPARIN SODIUM (PORCINE) 1000 UNIT/ML IJ SOLN
2000.0000 [IU] | Freq: Once | INTRAMUSCULAR | Status: DC
  Filled 2021-01-14: qty 2

## 2021-01-14 MED ORDER — CEFAZOLIN SODIUM-DEXTROSE 1-4 GM/50ML-% IV SOLN
1.0000 g | INTRAVENOUS | Status: DC
  Filled 2021-01-14: qty 50

## 2021-01-14 MED ORDER — HEPARIN SODIUM (PORCINE) 1000 UNIT/ML IJ SOLN
2000.0000 [IU] | Freq: Once | INTRAMUSCULAR | Status: AC
  Administered 2021-01-14: 1800 [IU]
  Filled 2021-01-14: qty 2

## 2021-01-14 NOTE — Consult Note (Signed)
[facility] Vascular Consult Note  MRN : [id number]  Sandra Massey is a 70 y.o. (1950/11/17) female who presents with chief complaint of  Chief Complaint  Patient presents with   Code Stroke  .  History of Present Illness: Patient is admitted to the hospital with ATN/ARF and has developed acute renal failure.  The patient has prior CVA, not purposefully communicative.  The patient is critically ill with ATN/ARF. The nephrology service has decided to initiate dialysis at this time, and we are asked to place a temporary dialysis catheter for immediate dialysis use.    Current Facility-Administered Medications  Medication Dose Route Frequency Provider Last Rate Last Admin   acetaminophen (TYLENOL) 160 MG/5ML solution 650 mg  650 mg Oral Q6H PRN Sharion Settler, NP   650 mg at 01/13/21 0404   ALPRAZolam Duanne Moron) tablet 0.5 mg  0.5 mg Oral BID PRN Shawna Clamp, MD   0.5 mg at 01/13/21 0158   amiodarone (PACERONE) tablet 200 mg  200 mg Oral BID Enzo Bi, MD   200 mg at 01/12/21 2100   apixaban (ELIQUIS) tablet 5 mg  5 mg Oral BID Oswald Hillock, RPH   5 mg at 01/12/21 2100   cefTRIAXone (ROCEPHIN) 2 g in sodium chloride 0.9 % 100 mL IVPB  2 g Intravenous Q24H Shawna Clamp, MD 200 mL/hr at 01/14/21 0906 2 g at 01/14/21 4854   Chlorhexidine Gluconate Cloth 2 % PADS 6 each  6 each Topical Daily Shawna Clamp, MD   6 each at 01/14/21 1100   docusate sodium (COLACE) capsule 100 mg  100 mg Oral BID PRN Enzo Bi, MD       insulin aspart (novoLOG) injection 0-15 Units  0-15 Units Subcutaneous TID WC Enzo Bi, MD   2 Units at 01/14/21 0842   levETIRAcetam (KEPPRA) IVPB 500 mg/100 mL premix  500 mg Intravenous Q12H Shawna Clamp, MD 400 mL/hr at 01/14/21 0847 500 mg at 01/14/21 0847   ondansetron (ZOFRAN) injection 4 mg  4 mg Intravenous Q6H PRN Enzo Bi, MD   4 mg at 01/11/21 2154   ondansetron (ZOFRAN-ODT) disintegrating tablet 4 mg  4 mg Oral Q8H PRN Enzo Bi, MD        polyethylene glycol (MIRALAX / GLYCOLAX) packet 17 g  17 g Oral BID PRN Enzo Bi, MD       rosuvastatin (CRESTOR) tablet 40 mg  40 mg Oral QPM Enzo Bi, MD   40 mg at 01/12/21 1818   sodium zirconium cyclosilicate (LOKELMA) packet 10 g  10 g Oral Once Liana Gerold, MD        Past Medical History:  Diagnosis Date   Arthritis    Chronic atrial fibrillation ([place])    CKD (chronic kidney disease), stage III ([place])    Diabetes mellitus without complication ([place])    History of ischemic cerebrovascular accident (CVA) with residual deficit    Hypertension    Substance abuse ([place])     Past Surgical History:  Procedure Laterality Date   BREAST BIOPSY Right    sebaceous cyst removed    Social History Social History   Tobacco Use   Smoking status: Never   Smokeless tobacco: Never   Tobacco comments:    2007 quit   Substance Use Topics   Alcohol use: Never   Drug use: Never    Family History No family history on file.  Allergies  Allergen Reactions   Latex Rash  REVIEW OF SYSTEMS (Negative unless checked)  Constitutional: [] Weight loss  [] Fever  [] Chills Cardiac: [] Chest pain   [] Chest pressure   [] Palpitations   [] Shortness of breath when laying flat   [] Shortness of breath at rest   [] Shortness of breath with exertion. Vascular:  [] Pain in legs with walking   [] Pain in legs at rest   [] Pain in legs when laying flat   [] Claudication   [] Pain in feet when walking  [] Pain in feet at rest  [] Pain in feet when laying flat   [] History of DVT   [] Phlebitis   [] Swelling in legs   [] Varicose veins   [] Non-healing ulcers Pulmonary:   [] Uses home oxygen   [] Productive cough   [] Hemoptysis   [] Wheeze  [] COPD   [] Asthma Neurologic:  [] Dizziness  [] Blackouts   [] Seizures   [] History of stroke   [] History of TIA  [] Aphasia   [] Temporary blindness   [] Dysphagia   [] Weakness or numbness in arms   [] Weakness or numbness in legs Musculoskeletal:  [] Arthritis   [] Joint swelling    [] Joint pain   [] Low back pain Hematologic:  [] Easy bruising  [] Easy bleeding   [] Hypercoagulable state   [] Anemic  [] Hepatitis Gastrointestinal:  [] Blood in stool   [] Vomiting blood  [] Gastroesophageal reflux/heartburn   [] Difficulty swallowing. Genitourinary:  [] Chronic kidney disease   [] Difficult urination  [] Frequent urination  [] Burning with urination   [] Blood in urine Skin:  [] Rashes   [] Ulcers   [] Wounds Psychological:  [] History of anxiety   []  History of major depression.  Unable to obtain the review of systems due to the patient's severe systemic illness and altered mental status.       Physical Examination  Vitals:   01/13/21 2320 01/14/21 0400 01/14/21 0734 01/14/21 1109  BP: (!) 166/91 (!) 115/99 (!) 144/100 115/68  Pulse: 70 74 61 74  Resp: (!) 24  20 20   Temp: 98 F (36.7 C) 97.8 F (36.6 C) 97.7 F (36.5 C) 97.6 F (36.4 C)  TempSrc: Oral Axillary Axillary Axillary  SpO2: 95% 98% 95% 95%  Weight:      Height:       Body mass index is 46.99 kg/m. Gen: WD/WN Head: C-Road/AT, No temporalis wasting Neck: Supple, no nuchal rigidity.  No JVD.  Pulmonary:  Good air movement, clear to auscultation bilaterally.  Cardiac: RRR, normal S1, S2, no Murmurs, rubs or gallops. Vascular: palpable femoral pulses bilaterally Gastrointestinal: soft, non-tender/non-distended. No guarding/reflex.  Musculoskeletal: M/S 5/5 throughout.  Extremities without ischemic changes.  No deformity or atrophy. 2+ Edema in the lower extremities bilaterally      CBC Lab Results  Component Value Date   WBC 9.7 01/14/2021   HGB 9.9 (L) 01/14/2021   HCT 31.5 (L) 01/14/2021   MCV 87.7 01/14/2021   PLT 184 01/14/2021    BMET    Component Value Date/Time   NA 139 01/14/2021 0547   K 5.2 (H) 01/14/2021 0547   CL 106 01/14/2021 0547   CO2 14 (L) 01/14/2021 0547   GLUCOSE 127 (H) 01/14/2021 0547   BUN 74 (H) 01/14/2021 0547   CREATININE 5.35 (H) 01/14/2021 0547   CALCIUM 7.2 (L)  01/14/2021 0547   GFRNONAA 8 (L) 01/14/2021 0547   GFRAA 55 (L) 06/05/2019 0423   Estimated Creatinine Clearance: 14.5 mL/min (A) (by C-G formula based on SCr of 5.35 mg/dL (H)).  COAG Lab Results  Component Value Date   INR 2.8 (H) 01/10/2021   INR 2.2 (H) 01/09/2021  INR 1.3 (H) 04/06/2020    Radiology CT ABDOMEN PELVIS WO CONTRAST  Result Date: 01/12/2021 CLINICAL DATA:  E coli bacteremia. Acute on chronic kidney disease. Acute abdominal pain. EXAM: CT ABDOMEN AND PELVIS WITHOUT CONTRAST TECHNIQUE: Multidetector CT imaging of the abdomen and pelvis was performed following the standard protocol without IV contrast. COMPARISON:  05/31/2019 FINDINGS: Body habitus reduces diagnostic sensitivity and specificity. Lower chest: Mild to moderate cardiomegaly. Small bilateral pleural effusions, right greater than left, with associated passive atelectasis. Hepatobiliary: Cholecystectomy.  No focal liver lesion identified. Pancreas: Unremarkable Spleen: Unremarkable Adrenals/Urinary Tract: Small amount of gas in the urinary bladder, query recent catheterization. Exophytic 2 cm lesion from the left kidney upper pole is fluid density and probably a cyst, similar to prior. No current hydronephrosis or hydroureter. 1.4 cm in long axis renal calculus in the right renal pelvis, previously in the right kidney lower pole. This is near the UPJ but is not associated with hydronephrosis. There is also a 2 mm right kidney lower pole calculus. 3 mm left kidney lower pole calculus similar to prior. No discrete ureteral or bladder calculus. Stomach/Bowel: Sigmoid colon diverticulosis without active diverticulitis identified. No dilated bowel. Vascular/Lymphatic: Atherosclerosis is present, including aortoiliac atherosclerotic disease. Reproductive: Unremarkable Other: Nonspecific presacral edema. Subcutaneous edema along the flanks and hip regions. Musculoskeletal: Old healed left lower rib fractures. Interval 30%  compression fracture eccentric to the right at the L1 vertebral level with associated vertebral sclerosis. Fracture involves the inferior endplate and the posterior vertebral body margin indicating anterior and middle column involvement. No substantial degree of bony retropulsion. Grade 1 degenerative anterolisthesis at L4-5 is chronic. Thoracic and lumbar spondylosis noted. IMPRESSION: 1. 1.4 cm in long axis right renal calculus has migrated to the right renal pelvis. This sits next to UPJ but is not causing hydronephrosis. Additional 2 mm right and 3 mm left kidney lower pole calculi likewise appear nonobstructive. 2. Sigmoid colon diverticulosis is present without definite active diverticulitis. 3. Interval 30% compression fracture at L1 with anterior and middle column involvement. No significant bony retropulsion. 4. Mild to moderate cardiomegaly with small bilateral pleural effusions, right greater than left. 5. Subcutaneous edema along the flanks and hips, along with some nonspecific edema in the presacral space probably reflecting mild third spacing of fluids. Electronically Signed   By: Van Clines M.D.   On: 01/12/2021 17:23   DG Chest 1 View  Result Date: 01/13/2021 CLINICAL DATA:  Speech difficulty, acute renal failure EXAM: PORTABLE CHEST 1 VIEW COMPARISON:  01/09/2021 FINDINGS: Cardiac shadow is enlarged but stable. Aortic calcifications are again seen. Lungs are well aerated bilaterally. Mild increased vascular congestion is seen without focal infiltrate or effusion. Old left rib fracture is seen. IMPRESSION: Mild vascular congestion without parenchymal edema. Electronically Signed   By: Inez Catalina M.D.   On: 01/13/2021 20:06   DG Abd 1 View  Result Date: 01/09/2021 CLINICAL DATA:  MRI clearance. EXAM: ABDOMEN - 1 VIEW COMPARISON:  05/31/2019 FINDINGS: 16 mm stone projects over the lower pole of the right kidney. Prior cholecystectomy. Prominent small bowel loops in the left abdomen.  Gas within nondistended large bowel. No organomegaly or free air. IMPRESSION: Cholecystectomy clips in the right upper quadrant. No other radiopaque foreign body. Right lower pole nephrolithiasis. Electronically Signed   By: Rolm Baptise M.D.   On: 01/09/2021 19:30   MR BRAIN WO CONTRAST  Result Date: 01/09/2021 CLINICAL DATA:  Acute neurologic deficit EXAM: MRI HEAD WITHOUT CONTRAST TECHNIQUE: Multiplanar, multiecho  pulse sequences of the brain and surrounding structures were obtained without intravenous contrast. COMPARISON:  None. FINDINGS: Brain: No acute infarct, mass effect or extra-axial collection. 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 Small amount of associated chronic blood products. Moderate bilateral periventricular white matter hyperintensity. Ex vacuo dilatation of the left lateral ventricle. Vascular: Major flow voids are preserved. Skull and upper cervical spine: Normal calvarium and skull base. Visualized upper cervical spine and soft tissues are normal. Sinuses/Orbits:No paranasal sinus fluid levels or advanced mucosal thickening. No mastoid or middle ear effusion. Normal orbits. IMPRESSION: 1. No acute intracranial abnormality. 2. 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 Electronically Signed   By: Ulyses Jarred M.D.   On: 01/09/2021 20:50   US RENAL  Result Date: 01/10/2021 CLINICAL DATA:  UTI. EXAM: RENAL / URINARY TRACT ULTRASOUND COMPLETE COMPARISON:  None. FINDINGS: Right Kidney: Renal measurements: 11.5 cm x 6.1 cm x 6.0 cm = volume: 222 mL. Diffusely increased echogenicity of the renal parenchyma is noted. No mass or hydronephrosis visualized. Left Kidney: Renal measurements: 11.1 cm x 6.0 cm x 4.9 cm = volume: 172 mL. Echogenicity within normal limits. A 2.0 cm x 1.6 cm x 1.6 cm anechoic structure is seen within the upper pole of the left kidney. No hydronephrosis is visualized. Bladder: Appears normal for degree of bladder distention. Other:  The study is limited secondary to the patient's body habitus. IMPRESSION: 1. Diffusely increased echogenicity of the right kidney which may be secondary to a diffuse infectious or inflammatory process. Medical renal disease cannot be excluded. 2. Small left renal cyst. Electronically Signed   By: Virgina Norfolk M.D.   On: 01/10/2021 15:46   DG Chest Port 1 View  Result Date: 01/09/2021 CLINICAL DATA:  Shortness of breath.  Slurred speech. EXAM: PORTABLE CHEST 1 VIEW COMPARISON:  04/06/2020 FINDINGS: Stable mild cardiomegaly. Aortic atherosclerotic calcification noted. Both lungs are clear. IMPRESSION: Stable mild cardiomegaly. No active lung disease. Electronically Signed   By: Marlaine Hind M.D.   On: 01/09/2021 14:51   EEG adult  Result Date: 01/10/2021 [name], MD     01/10/2021  1:42 PM Routine EEG Report Sandra Massey is a 70 y.o. female with a history of spells who is undergoing an EEG to evaluate for seizures. Report: This EEG was acquired with electrodes placed according to the International 10-20 electrode system (including Fp1, Fp2, F3, F4, C3, C4, P3, P4, O1, O2, T3, T4, T5, T6, A1, A2, Fz, Cz, Pz). The following electrodes were missing or displaced: none. The occipital dominant rhythm was 6 Hz. This activity is reactive to stimulation. Drowsiness was manifested by background fragmentation; deeper stages of sleep were identified by K complexes and sleep spindles. There was focal slowing over the left temporoparietal region. There were sharp waves noted occasionally (at times, frequently) in the left temporoparietal region. There were no electrographic seizures identified. Photic stimulation and hyperventilation were not performed. Impression and clinical correlation: This EEG was obtained while awake and asleep and is abnormal due to: - Mild diffuse slowing, indicative of global cerebral dysfunction - Left temporoparietal slowing indicative of superimposed focal cerebral dysfunction in  that area - Sharp waves located over the left temporoparietal region indicating increased epileptogenicity in that area There were no electrographic seizures observed during this recording. [name], MD [facility] [phone] If 7pm- 7am, please page neurology on call as listed in [place].   EEG adult  Result Date: 01/09/2021 Lora Havens, MD  01/09/2021  7:18 PM Patient Name: Sandra Massey MRN: [id number] Epilepsy Attending: Lora Havens Referring Physician/Provider: Dr Kerney Elbe Date: 01/09/2021 Duration: 20.56 mins Patient history: 70 year old female SNF resident with a history of atrial fibrillation, on Eliquis, who presents with acute onset of altered speech.  EEG to evaluate for seizure. Level of alertness: Awake, asleep AEDs during EEG study: None Technical aspects: This EEG study was done with scalp electrodes positioned according to the 10-20 International system of electrode placement. Electrical activity was acquired at a sampling rate of 500Hz  and reviewed with a high frequency filter of 70Hz  and a low frequency filter of 1Hz . EEG data were recorded continuously and digitally stored. Description: The posterior dominant rhythm consists of 8 Hz activity of moderate voltage (25-35 uV) seen predominantly in posterior head regions, asymmetric ( left<right) and reactive to eye opening and eye closing. Sleep was characterized by vertex waves, sleep spindles (12 to 14 Hz), maximal frontocentral region.  EEG showed continuous 3 to 6 Hz theta-delta slowing in left hemisphere, maximal left posterior quadrant. Spikes were also noted in left posterior temporal region. Physiologic photic driving was not seen during photic stimulation. Hyperventilation was not performed.   ABNORMALITY - Spike,left posterior temporal region - Continuous slow, left hemisphere, maximal left posterior quadrant - Background asymmetry, left<right IMPRESSION: This study showed evidence of epileptogenicity  arising from left posterior temporal region. There is also cortical dysfunction arising from left hemisphere, maximal left posterior quadrant,  likely secondary to underlying structural abnormality, post-ictal state. No seizures were seen throughout the recording. Dr Cheral Marker was notified. Lora Havens   CT HEAD CODE STROKE WO CONTRAST  Result Date: 01/09/2021 CLINICAL DATA:  Code stroke. EXAM: CT HEAD WITHOUT CONTRAST TECHNIQUE: Contiguous axial images were obtained from the base of the skull through the vertex without intravenous contrast. COMPARISON:  04/06/2020 FINDINGS: Brain: There is no acute intracranial hemorrhage, mass effect, or edema. Chronic infarct left MCA territory with associated volume loss. No new loss of gray-white differentiation. Additional areas low-density in the supratentorial white matter are nonspecific but probably reflect stable chronic microvascular ischemic changes. Vascular: No hyperdense vessel. There is intracranial atherosclerotic calcification at the skull base. Skull: Unremarkable. Sinuses/Orbits: Aerated.  Orbits are unremarkable. Other: Mastoid air cells are clear. ASPECTS (Green Level Stroke Program Early CT Score) - Ganglionic level infarction (caudate, lentiform nuclei, internal capsule, insula, M1-M3 cortex): 7 - Supraganglionic infarction (M4-M6 cortex): 3 Total score (0-10 with 10 being normal): 10 IMPRESSION: There is no acute intracranial hemorrhage or evidence of acute infarction. ASPECT score is 10. 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 These results were communicated to Dr. Cheral Marker at 2:08 pm on 01/09/2021 by text page via the [facility] messaging system. Electronically Signed   By: Macy Mis M.D.   On: 01/09/2021 14:14      Assessment/Plan 1. ARF 2. Left MCA   We will proceed with temporary dialysis catheter placement at this time.  Risks and benefits discussed with patient and/or family, and the  catheter will be placed to allow immediate initiation of dialysis.  If the patient's renal function does not improve throughout the hospital course, we will be happy to place a tunneled dialysis catheter for long term use prior to discharge.     Evaristo Bury, MD  01/14/2021 2:16 PM

## 2021-01-14 NOTE — Procedures (Signed)
  OPERATIVE NOTE   PROCEDURE: Ultrasound guidance for vascular access Right vein Placement of a 30cm temporary trialysis dialysis catheter Right femoral vein  PRE-OPERATIVE DIAGNOSIS: 1. Acute renal failure 2. ATN  POST-OPERATIVE DIAGNOSIS: Same  SURGEON: Jamesetta So MD  ASSISTANT(S): None  ANESTHESIA: local  ESTIMATED BLOOD LOSS: Minimal   FINDING(S): 1.  None  SPECIMEN(S):  None  INDICATIONS:    Patient is a 70 y.o.female who presents with ARF/ATN.  Risks and benefits were discussed, and informed consent was obtained..  DESCRIPTION: After obtaining full informed written consent, the patient was laid flat in the bed.  The Right femoral vein was sterilely prepped and draped in a sterile surgical field was created. The Femoral  vein was visualized with ultrasound and found to be widely patent. It was then accessed under direct guidance without difficulty with a Seldinger needle and a permanent image was recorded. A J-wire was then placed. After skin nick and dilatation, a 30cm dialysis catheter was placed over the wire and the wire was removed. The lumens withdrew dark red nonpulsatile blood and flushed easily with sterile saline. The catheter was secured to the skin with 3 nylon sutures. Sterile dressing was placed.  COMPLICATIONS: None  CONDITION: Stable  Caralee Morea A 01/14/2021 2:23 PM  This note was created with Dragon Medical transcription system. Any errors in dictation are purely unintentional.

## 2021-01-14 NOTE — Progress Notes (Signed)
Attempted to call all three contacts listed in epic and left voicemail's for 2 of the contacts instructing them to call back to speak with kidney doctor. The phone number for the 2nd contact listed is disconnected.

## 2021-01-14 NOTE — Progress Notes (Signed)
Patient does not answer orientation questions but yells out no and yes repetitive at times. Purposeful pushing away nurses arm and hand when attempting to perform mouth care. Will not follow commands.

## 2021-01-14 NOTE — Progress Notes (Addendum)
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  Head CT did not show any findings, neurology consult was obtained who recommended a stroke work-up EEG showed evidence of epileptiform waves arising in the left posterior temporal region.  Patient was started on Keppra 500 mg twice a day..  Patient became hypotensive requiring IV fluid blood cultures grew E. coli she was started on ceftriaxone IV nephrology was consulted for acute on chronic kidney disease, infectious disease was also consulted for the E. coli bacteremia to guide antibiotic treatment    Subjective January 13, 2021 Patient seen and examined at bedside Patient was looking quite altered patient had been wiggling around throwing off her clothes and removing her oxygen  January 14, 2021 Patient seen and examined at bedside Patient's condition is worsening.  She is more obtunded today she is still trying to answer to her name and raised triad of the bed. Nephrology is planning on doing hemodialysis or starting hemodialysis today   Assessment/Plan: Active Problems:   Speech abnormality   Sepsis ([place])  Expressive aphasia: Patient has a history of left MCA territory infarct with right sided hemiparesis and she was noted to have change in speech with cognitive deficit Nephrology consulted stroke work-up was started Patient is on [place] Ne[facility]rology following     Severe sepsis /E. coli bacteremia:  Patient with E. coli bacteremia However white count is improved Infectious disease consulted they are in agreement with ceftriaxone      Acute kidney injury on CKD stage IIIb.  Her baseline creatinine is 1.58 Her admission creatinine was 2.25 Suspect this is due to sepsis Will continue IV hydration Continue to avoid nephrotoxic medication Nephrology is following Vascular surgeon consulted for hemodialysis IV access in preparation for hemodialysis today     Elevated troponin: 1 patient denies any chest pain Patient has mildly elevated trop at baseline, ~100's.   Chronic A. fib on Eliquis: Continue amiodarone Need to monitor LFT in light of amiodarone use Continue Eliquis  Essential hypertension. Blood Pressure is on soft side. Hold amlodipine.   Type 2 diabetes Continue Accu-Chek with SSI coverage   Hypomagnesemia: Replaced, corrected  continue to monitor.   Elevated INR Elevated liver enzymes Unclear etiology.  No history of cirrhosis Monitor for now.   Cognitive deficits Could be secondary to dementia.  Acid-base imbalance Likely due to chronic kidney disease Bicarb infusion started Will monitor   Code Status: DNR  Severity of Illness: The appropriate patient status for this patient is INPATIENT. Inpatient status is judged to be reasonable and necessary in order to provide the required intensity of service to ensure the patient's safety. The patient's presenting symptoms, physical exam findings, and initial radiographic and laboratory data in the context of their chronic comorbidities is felt to place them at high risk for further clinical deterioration. Furthermore, it is not anticipated that the patient will be medically stable for discharge from the hospital within 2 midnights of admission.  Altered mental status hypoxia to be determined * I certify that at the point of admission it is my clinical judgment that the patient will require inpatient hospital care spanning beyond 2 midnights from  the point of admission due to high intensity of service, high risk for further deterioration and high  frequency of surveillance required.*   Family Communication: None at bedside  Disposition Plan: To be determined Status is: Inpatient   Dispo: The patient is from: Home              Anticipated d/c is to:               Anticipated d/c date is:               Patient currently not medically stable for discharge  Consultants: Nephrology Infectious disease  Procedures: None  Antimicrobials: Ceftriaxone  DVT prophylaxis: Apixaban  Objective: Vitals:   01/14/21 0400 01/14/21 0734 01/14/21 1109 01/14/21 1444  BP: (!) 115/99 (!) 144/100 115/68 (!) 102/54  Pulse: 74 61 74 (!) 106  Resp:  20 20 17   Temp: 97.8 F (36.6 C) 97.7 F (36.5 C) 97.6 F (36.4 C) 98.3 F (36.8 C)  TempSrc: Axillary Axillary Axillary Oral  SpO2: 98% 95% 95% 94%  Weight:      Height:        Intake/Output Summary (Last 24 hours) at 01/14/2021 1621 Last data filed at 01/14/2021 1500 Gross per 24 hour  Intake 612.44 ml  Output 200 ml  Net 412.44 ml   Filed Weights   01/09/21 1352  Weight: 136.1 kg   Body mass index is 46.99 kg/m.  Exam:  General: 70 y.o. year-old female well developed well nourished in no acute distress.  Drowsy Cardiovascular: Regular rate and rhythm with no rubs or gallops.  No thyromegaly or JVD noted.   Respiratory: Clear to auscultation with no wheezes or rales. Good inspiratory effort. Abdomen: Soft nontender nondistended with normal bowel sounds x4 quadrants. Musculoskeletal: No lower extremity edema. 2/4 pulses in all 4 extremities. Skin: No ulcerative lesions noted or rashes, Psychiatry: Mood is appropriate for condition and setting Neurology: Patient is drowsy and not really making conversation he mumbles something spoken to    Data Reviewed: CBC: Recent Labs  Lab 01/09/21 1417 01/10/21 0009 01/10/21 0457 01/11/21 0606 01/12/21 0541 01/13/21 0607 01/14/21 0547  WBC 21.2* 33.3* 27.7* 17.0* 16.3* 12.7* 9.7  NEUTROABS 19.1* 28.9*  --   --   --   --   --    HGB 12.1 11.0* 10.6* 10.1* 10.3* 10.0* 9.9*  HCT 38.3 34.6* 32.9* 33.2* 33.1* 31.4* 31.5*  MCV 89.7 89.4 91.1 92.2 91.4 90.2 87.7  PLT 197 179 156 127* 161 154 194    Basic Metabolic Panel: Recent Labs  Lab 01/10/21 0457 01/11/21 0606 01/12/21 0541 01/12/21 1200 01/13/21 0607 01/14/21 0547  NA 138 138 140 140 139 139  K 4.5 4.6 4.8 4.9 5.0 5.2*  CL 106 108 107 108 108 106  CO2 20* 20* 21* 18* 16* 14*  GLUCOSE 68* 103* 118* 115* 145* 127*  BUN 42* 56* 63* 65* 72* 74*  CREATININE 2.78* 3.29* 4.14* 4.27* 4.86* 5.35*  CALCIUM 8.2* 7.5* 7.6* 7.5* 7.3* 7.2*  MG 1.0* 1.5* 2.1  --  2.2 2.5*  PHOS  --   --   --   --  5.8*  --     GFR: Estimated Creatinine Clearance: 14.5 mL/min (A) (by C-G formula based on SCr of 5.35 mg/dL (H)). Liver Function Tests: Recent Labs  Lab 01/09/21 1417 01/10/21 0009 01/11/21 0606 01/12/21 0541 01/12/21 1200  AST 87* 181* 244* 149* 132*  ALT 59* 189* 215* 178* 164*  ALKPHOS 112 62 90 137* 140*  BILITOT 1.0 1.0 0.9 1.1 0.9  PROT 6.2* 5.8* 5.2* 6.0* 5.6*  ALBUMIN 2.9* 2.7* 2.2* 2.4* 2.4*    No results for input(s): LIPASE, AMYLASE in the last 168 hours. No results for input(s): AMMONIA in the last 168 hours. Coagulation Profile: Recent Labs  Lab 01/09/21 1417 01/10/21 0009  INR 2.2* 2.8*    Cardiac Enzymes: Recent Labs  Lab 01/13/21 0607  CKTOTAL 252*    BNP (last 3 results) No results for input(s): PROBNP in the last 8760 hours. HbA1C: No results for input(s): HGBA1C in the last 72 hours. CBG: Recent Labs  Lab 01/13/21 0755 01/13/21 1131 01/13/21 1624 01/14/21 0736 01/14/21 1108  GLUCAP 131* 88 104* 137* 89    Lipid Profile: No results for input(s): CHOL, HDL, LDLCALC, TRIG, CHOLHDL, LDLDIRECT in the last 72 hours. Thyroid Function Tests: No results for input(s): TSH, T4TOTAL, FREET4, T3FREE, THYROIDAB in the last 72 hours. Anemia Panel: No results for input(s): VITAMINB12, FOLATE, FERRITIN, TIBC, IRON, RETICCTPCT  in the last 72 hours. Urine analysis:    Component Value Date/Time   COLORURINE BROWN (A) 01/09/2021 1417   APPEARANCEUR TURBID (A) 01/09/2021 1417   LABSPEC 1.017 01/09/2021 1417   PHURINE  01/09/2021 1417    TEST NOT REPORTED DUE TO COLOR INTERFERENCE OF URINE PIGMENT   GLUCOSEU (A) 01/09/2021 1417    TEST NOT REPORTED DUE TO COLOR INTERFERENCE OF URINE PIGMENT   HGBUR (A) 01/09/2021 1417    TEST NOT REPORTED DUE TO COLOR INTERFERENCE OF URINE PIGMENT   BILIRUBINUR (A) 01/09/2021 1417    TEST NOT REPORTED DUE TO COLOR INTERFERENCE OF URINE PIGMENT   KETONESUR (A) 01/09/2021 1417    TEST NOT REPORTED DUE TO COLOR INTERFERENCE OF URINE PIGMENT   PROTEINUR (A) 01/09/2021 1417    TEST NOT REPORTED DUE TO COLOR INTERFERENCE OF URINE PIGMENT   NITRITE (A) 01/09/2021 1417    TEST NOT REPORTED DUE TO COLOR INTERFERENCE OF URINE PIGMENT   LEUKOCYTESUR (A) 01/09/2021 1417    TEST NOT REPORTED DUE TO COLOR INTERFERENCE OF URINE PIGMENT   Sepsis Labs: @LABRCNTIP (procalcitonin:4,lacticidven:4)  ) Recent Results (from the past 240 hour(s))  Resp Panel by RT-PCR (Flu A&B, Covid) Nasopharyngeal Swab     Status: None   Collection Time: 01/09/21  2:17 PM   Specimen: Nasopharyngeal Swab; Nasopharyngeal(NP) swabs in vial transport medium  Result Value Ref Range Status   SARS Coronavirus 2 by RT PCR NEGATIVE NEGATIVE Final    Comment: (NOTE) SARS-CoV-2 target nucleic acids are NOT DETECTED.  The SARS-CoV-2 RNA is generally detectable in upper respiratory specimens during the acute phase of infection. The lowest concentration of SARS-CoV-2 viral copies this assay can detect is 138 copies/mL. A negative result does not preclude SARS-Cov-2 infection and should not be used as the sole basis for treatment or other patient management decisions. A negative result may occur with  improper specimen collection/handling, submission of specimen other than nasopharyngeal swab, presence of viral  mutation(s) within the areas targeted by this assay, and inadequate number of viral copies(<138 copies/mL). A negative result must be combined with clinical observations, patient history, and epidemiological information. The expected result is Negative.  Fact Sheet for Patients:  EntrepreneurPulse.com.au  Fact Sheet for Healthcare Providers:  IncredibleEmployment.be  This test is no t yet approved or cleared by the [place] FDA and  has been authorized for detection and/or diagnosis of SARS-CoV-2 by FDA under an Emergency Use Authorization (  EUA). This EUA will remain  in effect (meaning this test can be used) for the duration of the COVID-19 declaration under Section 564(b)(1) of the Act, 21 U.S.C.section 360bbb-3(b)(1), unless the authorization is terminated  or revoked sooner.       Influenza A by PCR NEGATIVE NEGATIVE Final   Influenza B by PCR NEGATIVE NEGATIVE Final    Comment: (NOTE) The Xpert Xpress SARS-CoV-2/FLU/RSV plus assay is intended as an aid in the diagnosis of influenza from Nasopharyngeal swab specimens and should not be used as a sole basis for treatment. Nasal washings and aspirates are unacceptable for Xpert Xpress SARS-CoV-2/FLU/RSV testing.  Fact Sheet for Patients: EntrepreneurPulse.com.au  Fact Sheet for Healthcare Providers: IncredibleEmployment.be  This test is not yet approved or cleared by the [place] FDA and has been authorized for detection and/or diagnosis of SARS-CoV-2 by FDA under an Emergency Use Authorization (EUA). This EUA will remain in effect (meaning this test can be used) for the duration of the COVID-19 declaration under Section 564(b)(1) of the Act, 21 U.S.C. section 360bbb-3(b)(1), unless the authorization is terminated or revoked.  Performed at [facility], [address] [place], [place] [address]   Culture, blood (Routine X 2) w  Reflex to ID Panel     Status: Abnormal   Collection Time: 01/10/21 12:09 AM   Specimen: BLOOD  Result Value Ref Range Status   Specimen Description   Final    BLOOD RIGHT FOREARM Performed at [facility], [address], [place], [place] [address]    Special Requests   Final    BOTTLES DRAWN AEROBIC AND ANAEROBIC Blood Culture results may not be optimal due to an excessive volume of blood received in culture bottles Performed at [facility], [address] [place], [place] [address]    Culture  Setup Time   Final    GRAM NEGATIVE RODS IN BOTH AEROBIC AND ANAEROBIC BOTTLES CRITICAL RESULT CALLED TO, READ BACK BY AND VERIFIED WITH: PHARMD S HALLHAI [id number] AT 815 AM BY CM    Culture (A)  Final    ESCHERICHIA COLI SUSCEPTIBILITIES PERFORMED ON PREVIOUS CULTURE WITHIN THE LAST 5 DAYS. Performed at [facility], [place] [address] [place], [place] [address]    Report Status 01/13/2021 FINAL  Final  Culture, blood (Routine X 2) w Reflex to ID Panel     Status: Abnormal   Collection Time: 01/10/21 12:09 AM   Specimen: BLOOD  Result Value Ref Range Status   Specimen Description   Final    BLOOD LEFT ANTECUBITAL Performed at [facility], [place] [address] [place], [place] [address]    Special Requests   Final    BOTTLES DRAWN AEROBIC AND ANAEROBIC Blood Culture adequate volume Performed at [place] [place], [place] [address]    Culture  Setup Time   Final    GRAM NEGATIVE RODS IN BOTH AEROBIC AND ANAEROBIC BOTTLES CRITICAL RESULT CALLED TO, READ BACK BY AND VERIFIED WITH: RAQUEL GOODMAN, PHARMD AT 1350 ON 01/10/21 BY GM Performed at [facility], [place] [address], [place],  [address]    Culture ESCHERICHIA COLI (A)  Final   Report Status 01/13/2021 FINAL  Final   Organism ID, Bacteria ESCHERICHIA COLI  Final      Susceptibility   Escherichia coli - MIC*    AMPICILLIN >=32 RESISTANT Resistant     CEFAZOLIN  <=4 SENSITIVE Sensitive     CEFEPIME <=0.12  SENSITIVE Sensitive     CEFTAZIDIME <=1 SENSITIVE Sensitive     CEFTRIAXONE <=0.25 SENSITIVE Sensitive     CIPROFLOXACIN <=0.25 SENSITIVE Sensitive     GENTAMICIN 8 INTERMEDIATE Intermediate     IMIPENEM <=0.25 SENSITIVE Sensitive     TRIMETH/SULFA >=320 RESISTANT Resistant     AMPICILLIN/SULBACTAM >=32 RESISTANT Resistant     PIP/TAZO <=4 SENSITIVE Sensitive     * ESCHERICHIA COLI  Blood Culture ID Panel (Reflexed)     Status: Abnormal   Collection Time: 01/10/21 12:09 AM  Result Value Ref Range Status   Enterococcus faecalis NOT DETECTED NOT DETECTED Final   Enterococcus Faecium NOT DETECTED NOT DETECTED Final   Listeria monocytogenes NOT DETECTED NOT DETECTED Final   Staphylococcus species NOT DETECTED NOT DETECTED Final   Staphylococcus aureus (BCID) NOT DETECTED NOT DETECTED Final   Staphylococcus epidermidis NOT DETECTED NOT DETECTED Final   Staphylococcus lugdunensis NOT DETECTED NOT DETECTED Final   Streptococcus species NOT DETECTED NOT DETECTED Final   Streptococcus agalactiae NOT DETECTED NOT DETECTED Final   Streptococcus pneumoniae NOT DETECTED NOT DETECTED Final   Streptococcus pyogenes NOT DETECTED NOT DETECTED Final   A.calcoaceticus-baumannii NOT DETECTED NOT DETECTED Final   Bacteroides fragilis NOT DETECTED NOT DETECTED Final   Enterobacterales DETECTED (A) NOT DETECTED Final    Comment: Enterobacterales represent a large order of gram negative bacteria, not a single organism. CRITICAL RESULT CALLED TO, READ BACK BY AND VERIFIED WITH: [place], PHARMD AT 1350 ON 01/10/21 BY GM    Enterobacter cloacae complex NOT DETECTED NOT DETECTED Final   Escherichia coli DETECTED (A) NOT DETECTED Final    Comment: CRITICAL RESULT CALLED TO, READ BACK BY AND VERIFIED WITH: [place], PHARMD AT 1350 ON 01/10/21 BY GM    Klebsiella aerogenes NOT DETECTED NOT DETECTED Final   Klebsiella oxytoca NOT DETECTED NOT DETECTED  Final   Klebsiella pneumoniae NOT DETECTED NOT DETECTED Final   Proteus species NOT DETECTED NOT DETECTED Final   Salmonella species NOT DETECTED NOT DETECTED Final   Serratia marcescens NOT DETECTED NOT DETECTED Final   Haemophilus influenzae NOT DETECTED NOT DETECTED Final   Neisseria meningitidis NOT DETECTED NOT DETECTED Final   Pseudomonas aeruginosa NOT DETECTED NOT DETECTED Final   Stenotrophomonas maltophilia NOT DETECTED NOT DETECTED Final   Candida albicans NOT DETECTED NOT DETECTED Final   Candida auris NOT DETECTED NOT DETECTED Final   Candida glabrata NOT DETECTED NOT DETECTED Final   Candida krusei NOT DETECTED NOT DETECTED Final   Candida parapsilosis NOT DETECTED NOT DETECTED Final   Candida tropicalis NOT DETECTED NOT DETECTED Final   Cryptococcus neoformans/gattii NOT DETECTED NOT DETECTED Final   CTX-M ESBL NOT DETECTED NOT DETECTED Final   Carbapenem resistance IMP NOT DETECTED NOT DETECTED Final   Carbapenem resistance KPC NOT DETECTED NOT DETECTED Final   Carbapenem resistance NDM NOT DETECTED NOT DETECTED Final   Carbapenem resist OXA 48 LIKE NOT DETECTED NOT DETECTED Final   Carbapenem resistance VIM NOT DETECTED NOT DETECTED Final    Comment: Performed at [facility], [address], [place], [place] [address]  Urine Culture     Status: Abnormal   Collection Time: 01/10/21  1:05 PM   Specimen: Urine, Random  Result Value Ref Range Status   Specimen Description   Final    URINE, RANDOM Performed at [facility], [address], [place],  [place] [address]    Special Requests   Final    NONE Performed at  [facility], [address] [place],  [address]    Culture MULTIPLE SPECIES PRESENT, SUGGEST RECOLLECTION (A)  Final   Report Status 01/11/2021 FINAL  Final      Studies: No results found.  Scheduled Meds:  amiodarone  200 mg Oral BID   apixaban  5 mg Oral BID   Chlorhexidine Gluconate Cloth  6 each Topical  Daily   heparin sodium (porcine)  2,000 Units Intracatheter Once   insulin aspart  0-15 Units Subcutaneous TID WC   rosuvastatin  40 mg Oral QPM   sodium zirconium cyclosilicate  10 g Oral Once    Continuous Infusions:  [START ON 01/15/2021]  ceFAZolin (ANCEF) IV     levETIRAcetam 500 mg (01/14/21 0847)     LOS: 5 days     Cristal Deer, MD [facility]  To reach me or the doctor on call, go to: [email or web address] Password [email or web address]  01/14/2021, 4:21 PM

## 2021-01-14 NOTE — Progress Notes (Addendum)
[facility]  ROUNDING NOTE   Subjective:   Patient was seen today on second floor Patient remains confused and drowsy Patient is not able to offer any specific physical needs.    Objective:  Vital signs in last 24 hours:  Temp:  [97.6 F (36.4 C)-98 F (36.7 C)] 97.7 F (36.5 C) (11/06 0734) Pulse Rate:  [61-97] 61 (11/06 0734) Resp:  [18-24] 20 (11/06 0734) BP: (114-166)/(52-100) 144/100 (11/06 0734) SpO2:  [92 %-98 %] 95 % (11/06 0734)  Weight change:  Filed Weights   01/09/21 1352  Weight: 136.1 kg    Intake/Output: I/O last 3 completed shifts: In: 1529.6 [I.V.:915.1; IV Piggyback:614.5] Out: 650 [Urine:650]   Intake/Output this shift:  No intake/output data recorded.  Physical Exam: General: Patient is moaning  Head: Normocephalic, atraumatic. Moist oral mucosal membranes  Eyes: Anicteric  Lungs:  Clear to auscultation, normal effort  Heart: No rub  Abdomen:  Soft, nontender  Extremities:  trace peripheral edema.  Neurologic: Patient is aphasic  Skin: No lesions       Basic Metabolic Panel: Recent Labs  Lab 01/10/21 0457 01/11/21 0606 01/12/21 0541 01/12/21 1200 01/13/21 0607 01/14/21 0547  NA 138 138 140 140 139 139  K 4.5 4.6 4.8 4.9 5.0 5.2*  CL 106 108 107 108 108 106  CO2 20* 20* 21* 18* 16* 14*  GLUCOSE 68* 103* 118* 115* 145* 127*  BUN 42* 56* 63* 65* 72* 74*  CREATININE 2.78* 3.29* 4.14* 4.27* 4.86* 5.35*  CALCIUM 8.2* 7.5* 7.6* 7.5* 7.3* 7.2*  MG 1.0* 1.5* 2.1  --  2.2 2.5*  PHOS  --   --   --   --  5.8*  --     Liver Function Tests: Recent Labs  Lab 01/09/21 1417 01/10/21 0009 01/11/21 0606 01/12/21 0541 01/12/21 1200  AST 87* 181* 244* 149* 132*  ALT 59* 189* 215* 178* 164*  ALKPHOS 112 62 90 137* 140*  BILITOT 1.0 1.0 0.9 1.1 0.9  PROT 6.2* 5.8* 5.2* 6.0* 5.6*  ALBUMIN 2.9* 2.7* 2.2* 2.4* 2.4*   No results for input(s): LIPASE, AMYLASE in the last 168 hours. No results for input(s): AMMONIA in the last  168 hours.  CBC: Recent Labs  Lab 01/09/21 1417 01/10/21 0009 01/10/21 0457 01/11/21 0606 01/12/21 0541 01/13/21 0607 01/14/21 0547  WBC 21.2* 33.3* 27.7* 17.0* 16.3* 12.7* 9.7  NEUTROABS 19.1* 28.9*  --   --   --   --   --   HGB 12.1 11.0* 10.6* 10.1* 10.3* 10.0* 9.9*  HCT 38.3 34.6* 32.9* 33.2* 33.1* 31.4* 31.5*  MCV 89.7 89.4 91.1 92.2 91.4 90.2 87.7  PLT 197 179 156 127* 161 154 184    Cardiac Enzymes: Recent Labs  Lab 01/13/21 0607  CKTOTAL 252*    BNP: Invalid input(s): POCBNP  CBG: Recent Labs  Lab 01/12/21 2118 01/13/21 0755 01/13/21 1131 01/13/21 1624 01/14/21 0736  GLUCAP 159* 131* 88 104* 137*    Microbiology: Results for orders placed or performed during the hospital encounter of 01/09/21  Resp Panel by RT-PCR (Flu A&B, Covid) Nasopharyngeal Swab     Status: None   Collection Time: 01/09/21  2:17 PM   Specimen: Nasopharyngeal Swab; Nasopharyngeal(NP) swabs in vial transport medium  Result Value Ref Range Status   SARS Coronavirus 2 by RT PCR NEGATIVE NEGATIVE Final    Comment: (NOTE) SARS-CoV-2 target nucleic acids are NOT DETECTED.  The SARS-CoV-2 RNA is generally detectable in upper respiratory specimens  during the acute phase of infection. The lowest concentration of SARS-CoV-2 viral copies this assay can detect is 138 copies/mL. A negative result does not preclude SARS-Cov-2 infection and should not be used as the sole basis for treatment or other patient management decisions. A negative result may occur with  improper specimen collection/handling, submission of specimen other than nasopharyngeal swab, presence of viral mutation(s) within the areas targeted by this assay, and inadequate number of viral copies(<138 copies/mL). A negative result must be combined with clinical observations, patient history, and epidemiological information. The expected result is Negative.  Fact Sheet for Patients:   EntrepreneurPulse.com.au  Fact Sheet for Healthcare Providers:  IncredibleEmployment.be  This test is no t yet approved or cleared by the [place] FDA and  has been authorized for detection and/or diagnosis of SARS-CoV-2 by FDA under an Emergency Use Authorization (EUA). This EUA will remain  in effect (meaning this test can be used) for the duration of the COVID-19 declaration under Section 564(b)(1) of the Act, 21 U.S.C.section 360bbb-3(b)(1), unless the authorization is terminated  or revoked sooner.       Influenza A by PCR NEGATIVE NEGATIVE Final   Influenza B by PCR NEGATIVE NEGATIVE Final    Comment: (NOTE) The Xpert Xpress SARS-CoV-2/FLU/RSV plus assay is intended as an aid in the diagnosis of influenza from Nasopharyngeal swab specimens and should not be used as a sole basis for treatment. Nasal washings and aspirates are unacceptable for Xpert Xpress SARS-CoV-2/FLU/RSV testing.  Fact Sheet for Patients: EntrepreneurPulse.com.au  Fact Sheet for Healthcare Providers: IncredibleEmployment.be  This test is not yet approved or cleared by the [place] FDA and has been authorized for detection and/or diagnosis of SARS-CoV-2 by FDA under an Emergency Use Authorization (EUA). This EUA will remain in effect (meaning this test can be used) for the duration of the COVID-19 declaration under Section 564(b)(1) of the Act, 21 U.S.C. section 360bbb-3(b)(1), unless the authorization is terminated or revoked.  Performed at [facility], [address] [place], [place] [address]   Culture, blood (Routine X 2) w Reflex to ID Panel     Status: Abnormal   Collection Time: 01/10/21 12:09 AM   Specimen: BLOOD  Result Value Ref Range Status   Specimen Description   Final    BLOOD RIGHT FOREARM Performed at [facility], [address] [place], [place] [address]    Special Requests    Final    BOTTLES DRAWN AEROBIC AND ANAEROBIC Blood Culture results may not be optimal due to an excessive volume of blood received in culture bottles Performed at [facility], [address] [place], [place] [address]    Culture  Setup Time   Final    GRAM NEGATIVE RODS IN BOTH AEROBIC AND ANAEROBIC BOTTLES CRITICAL RESULT CALLED TO, READ BACK BY AND VERIFIED WITH: PHARMD S HALLHAI [id number] AT 815 AM BY CM    Culture (A)  Final    ESCHERICHIA COLI SUSCEPTIBILITIES PERFORMED ON PREVIOUS CULTURE WITHIN THE LAST 5 DAYS. Performed at [facility], [place] [address], [place], [place] [address]    Report Status 01/13/2021 FINAL  Final  Culture, blood (Routine X 2) w Reflex to ID Panel     Status: Abnormal   Collection Time: 01/10/21 12:09 AM   Specimen: BLOOD  Result Value Ref Range Status   Specimen Description   Final    BLOOD LEFT ANTECUBITAL Performed at [facility], [place] [address] [place], [place]  [address]    Special Requests   Final    BOTTLES DRAWN AEROBIC AND ANAEROBIC Blood Culture adequate volume Performed at [facility], [address] [place], [place] [address]    Culture  Setup Time   Final    GRAM NEGATIVE RODS IN BOTH AEROBIC AND ANAEROBIC BOTTLES CRITICAL RESULT CALLED TO, READ BACK BY AND VERIFIED WITH: RAQUEL GOODMAN, PHARMD AT 1350 ON 01/10/21 BY GM Performed at [facility], [place] [address], [place], [place] [address]    Culture ESCHERICHIA COLI (A)  Final   Report Status 01/13/2021 FINAL  Final   Organism ID, Bacteria ESCHERICHIA COLI  Final      Susceptibility   Escherichia coli - MIC*    AMPICILLIN >=32 RESISTANT Resistant     CEFAZOLIN <=4 SENSITIVE Sensitive     CEFEPIME <=0.12 SENSITIVE Sensitive     CEFTAZIDIME <=1 SENSITIVE Sensitive     CEFTRIAXONE <=0.25 SENSITIVE Sensitive     CIPROFLOXACIN <=0.25 SENSITIVE Sensitive     GENTAMICIN 8 INTERMEDIATE Intermediate     IMIPENEM <=0.25 SENSITIVE Sensitive      TRIMETH/SULFA >=320 RESISTANT Resistant     AMPICILLIN/SULBACTAM >=32 RESISTANT Resistant     PIP/TAZO <=4 SENSITIVE Sensitive     * ESCHERICHIA COLI  Blood Culture ID Panel (Reflexed)     Status: Abnormal   Collection Time: 01/10/21 12:09 AM  Result Value Ref Range Status   Enterococcus faecalis NOT DETECTED NOT DETECTED Final   Enterococcus Faecium NOT DETECTED NOT DETECTED Final   Listeria monocytogenes NOT DETECTED NOT DETECTED Final   Staphylococcus species NOT DETECTED NOT DETECTED Final   Staphylococcus aureus (BCID) NOT DETECTED NOT DETECTED Final   Staphylococcus epidermidis NOT DETECTED NOT DETECTED Final   Staphylococcus lugdunensis NOT DETECTED NOT DETECTED Final   Streptococcus species NOT DETECTED NOT DETECTED Final   Streptococcus agalactiae NOT DETECTED NOT DETECTED Final   Streptococcus pneumoniae NOT DETECTED NOT DETECTED Final   Streptococcus pyogenes NOT DETECTED NOT DETECTED Final   A.calcoaceticus-baumannii NOT DETECTED NOT DETECTED Final   Bacteroides fragilis NOT DETECTED NOT DETECTED Final   Enterobacterales DETECTED (A) NOT DETECTED Final    Comment: Enterobacterales represent a large order of gram negative bacteria, not a single organism. CRITICAL RESULT CALLED TO, READ BACK BY AND VERIFIED WITH: [place], PHARMD AT 1350 ON 01/10/21 BY GM    Enterobacter cloacae complex NOT DETECTED NOT DETECTED Final   Escherichia coli DETECTED (A) NOT DETECTED Final    Comment: CRITICAL RESULT CALLED TO, READ BACK BY AND VERIFIED WITH: [place], PHARMD AT 1350 ON 01/10/21 BY GM    Klebsiella aerogenes NOT DETECTED NOT DETECTED Final   Klebsiella oxytoca NOT DETECTED NOT DETECTED Final   Klebsiella pneumoniae NOT DETECTED NOT DETECTED Final   Proteus species NOT DETECTED NOT DETECTED Final   Salmonella species NOT DETECTED NOT DETECTED Final   Serratia marcescens NOT DETECTED NOT DETECTED Final   Haemophilus influenzae NOT DETECTED NOT DETECTED Final    Neisseria meningitidis NOT DETECTED NOT DETECTED Final   Pseudomonas aeruginosa NOT DETECTED NOT DETECTED Final   Stenotrophomonas maltophilia NOT DETECTED NOT DETECTED Final   Candida albicans NOT DETECTED NOT DETECTED Final   Candida auris NOT DETECTED NOT DETECTED Final   Candida glabrata NOT DETECTED NOT DETECTED Final   Candida krusei NOT DETECTED NOT DETECTED Final   Candida parapsilosis NOT DETECTED NOT DETECTED Final   Candida tropicalis NOT DETECTED NOT DETECTED Final   Cryptococcus neoformans/gattii NOT  DETECTED NOT DETECTED Final   CTX-M ESBL NOT DETECTED NOT DETECTED Final   Carbapenem resistance IMP NOT DETECTED NOT DETECTED Final   Carbapenem resistance KPC NOT DETECTED NOT DETECTED Final   Carbapenem resistance NDM NOT DETECTED NOT DETECTED Final   Carbapenem resist OXA 48 LIKE NOT DETECTED NOT DETECTED Final   Carbapenem resistance VIM NOT DETECTED NOT DETECTED Final    Comment: Performed at [facility], [address], [place], [place] [address]  Urine Culture     Status: Abnormal   Collection Time: 01/10/21  1:05 PM   Specimen: Urine, Random  Result Value Ref Range Status   Specimen Description   Final    URINE, RANDOM Performed at [facility], [address], [place], [place] [address]    Special Requests   Final    NONE Performed at [facility], [address], [place], [place] [address]    Culture MULTIPLE SPECIES PRESENT, SUGGEST RECOLLECTION (A)  Final   Report Status 01/11/2021 FINAL  Final    Coagulation Studies: No results for input(s): LABPROT, INR in the last 72 hours.   Urinalysis: No results for input(s): COLORURINE, LABSPEC, PHURINE, GLUCOSEU, HGBUR, BILIRUBINUR, KETONESUR, PROTEINUR, UROBILINOGEN, NITRITE, LEUKOCYTESUR in the last 72 hours.  Invalid input(s): APPERANCEUR     Imaging: CT ABDOMEN PELVIS WO CONTRAST  Result Date: 01/12/2021 CLINICAL DATA:  E coli bacteremia. Acute on chronic kidney disease.  Acute abdominal pain. EXAM: CT ABDOMEN AND PELVIS WITHOUT CONTRAST TECHNIQUE: Multidetector CT imaging of the abdomen and pelvis was performed following the standard protocol without IV contrast. COMPARISON:  05/31/2019 FINDINGS: Body habitus reduces diagnostic sensitivity and specificity. Lower chest: Mild to moderate cardiomegaly. Small bilateral pleural effusions, right greater than left, with associated passive atelectasis. Hepatobiliary: Cholecystectomy.  No focal liver lesion identified. Pancreas: Unremarkable Spleen: Unremarkable Adrenals/Urinary Tract: Small amount of gas in the urinary bladder, query recent catheterization. Exophytic 2 cm lesion from the left kidney upper pole is fluid density and probably a cyst, similar to prior. No current hydronephrosis or hydroureter. 1.4 cm in long axis renal calculus in the right renal pelvis, previously in the right kidney lower pole. This is near the UPJ but is not associated with hydronephrosis. There is also a 2 mm right kidney lower pole calculus. 3 mm left kidney lower pole calculus similar to prior. No discrete ureteral or bladder calculus. Stomach/Bowel: Sigmoid colon diverticulosis without active diverticulitis identified. No dilated bowel. Vascular/Lymphatic: Atherosclerosis is present, including aortoiliac atherosclerotic disease. Reproductive: Unremarkable Other: Nonspecific presacral edema. Subcutaneous edema along the flanks and hip regions. Musculoskeletal: Old healed left lower rib fractures. Interval 30% compression fracture eccentric to the right at the L1 vertebral level with associated vertebral sclerosis. Fracture involves the inferior endplate and the posterior vertebral body margin indicating anterior and middle column involvement. No substantial degree of bony retropulsion. Grade 1 degenerative anterolisthesis at L4-5 is chronic. Thoracic and lumbar spondylosis noted. IMPRESSION: 1. 1.4 cm in long axis right renal calculus has migrated to the  right renal pelvis. This sits next to UPJ but is not causing hydronephrosis. Additional 2 mm right and 3 mm left kidney lower pole calculi likewise appear nonobstructive. 2. Sigmoid colon diverticulosis is present without definite active diverticulitis. 3. Interval 30% compression fracture at L1 with anterior and middle column involvement. No significant bony retropulsion. 4. Mild to moderate cardiomegaly with small bilateral pleural effusions, right greater than left. 5. Subcutaneous edema along the flanks and hips, along with some nonspecific edema  in the presacral space probably reflecting mild third spacing of fluids. Electronically Signed   By: Van Clines M.D.   On: 01/12/2021 17:23   DG Chest 1 View  Result Date: 01/13/2021 CLINICAL DATA:  Speech difficulty, acute renal failure EXAM: PORTABLE CHEST 1 VIEW COMPARISON:  01/09/2021 FINDINGS: Cardiac shadow is enlarged but stable. Aortic calcifications are again seen. Lungs are well aerated bilaterally. Mild increased vascular congestion is seen without focal infiltrate or effusion. Old left rib fracture is seen. IMPRESSION: Mild vascular congestion without parenchymal edema. Electronically Signed   By: Inez Catalina M.D.   On: 01/13/2021 20:06     Medications:    cefTRIAXone (ROCEPHIN)  IV 2 g (01/13/21 0933)   levETIRAcetam 500 mg (01/13/21 2059)    amiodarone  200 mg Oral BID   apixaban  5 mg Oral BID   Chlorhexidine Gluconate Cloth  6 each Topical Daily   insulin aspart  0-15 Units Subcutaneous TID WC   rosuvastatin  40 mg Oral QPM   acetaminophen (TYLENOL) oral liquid 160 mg/5 mL, ALPRAZolam, docusate sodium, ondansetron (ZOFRAN) IV, ondansetron, polyethylene glycol  Assessment/ Plan:  Sandra Massey is a 70 y.o.  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 Patient AKI is now worsening. Patient was admitted with creatinine of 2.2, patient creatinine is now at 4.86 Patient potassium is trending up.  Patient was admitted with a potassium of 3.5 has now come up to 5.0 Patient has become more acidotic.  Patient bicarb was 22 on November 2 has now come down to 16 Patient has also become oliguric. Patient urine output has decreased from November 3 when patient had more than a liter urine output to now 200 yesterday   Patient is oliguric, hyperkalemic and acidotic Pt will need dialysis today   2)HTN    Blood pressure is stable    3)Anemia of chronic disease  CBC Latest Ref Rng & Units 01/14/2021 01/13/2021 01/12/2021  WBC 4.0 - 10.5 K/uL 9.7 12.7(H) 16.3(H)  Hemoglobin 12.0 - 15.0 g/dL 9.9(L) 10.0(L) 10.3(L)  Hematocrit 36.0 - 46.0 % 31.5(L) 31.4(L) 33.1(L)  Platelets 150 - 400 K/uL 184 154 161       HGb at goal (9--11)   4) Hyperphosphatemia Patient phosphorus on the higher side This is most likely secondary to renal failure associated hyperphosphatemia We will hold off on starting binders for now   Lab Results  Component Value Date   CALCIUM 7.2 (L) 01/14/2021   PHOS 5.8 (H) 01/13/2021      5)Sepsis Patient has E. coli bacteremia Patient currently on IV antibiotics Patient WBC counts are trending down  6) Electrolytes   BMP Latest Ref Rng & Units 01/14/2021 01/13/2021 01/12/2021  Glucose 70 - 99 mg/dL 127(H) 145(H) 115(H)  BUN 8 - 23 mg/dL 74(H) 72(H) 65(H)  Creatinine 0.44 - 1.00 mg/dL 5.35(H) 4.86(H) 4.27(H)  Sodium 135 - 145 mmol/L 139 139 140  Potassium 3.5 - 5.1 mmol/L 5.2(H)  5.0 4.9  Chloride 98 - 111 mmol/L 106 108 108  CO2 22 - 32 mmol/L 14(L) 16(L) 18(L)  Calcium 8.9 - 10.3 mg/dL 7.2(L) 7.3(L) 7.5(L)     Sodium Normonatremic   Potassium Hyperkalemia We will give patient Lokelma    7)Acid base  Anion gap is equal to 139-120 is equal to 19 Delta anion gap 19-9 is equal to 10  Delta bicarb 24-14 is equal to 10   Bicarb is worsening     Plan  We will give Lokelma for hyperkalemia  Patient may  require renal replacement therapy  I called patient family multiple times/all the different numbers listed in patient's chart has next of kin Have not been able to get in touch with anybody so far -I have discussed this with primary team as well about possible need for replacement therapy soon We will continue to try and then decide if the patient needs emergent dialysis today  I did discuss with the primary team that in case patient needs emergent dialysis today we will have to also contact vascular surgery for need of temporary Dialysis catheter  Addendum  After multiple attempts I was able to reach Ms Sandra Massey ( pt's sister) Pt family was okay with starting dialysis In the mean time pt ABG shows pt is compensated well   Results for WAVA, KILDOW (MRN [id number]) as of 01/14/2021 11:53  Ref. Range 01/14/2021 11:23  pH, Arterial Latest Ref Range: 7.350 - 7.450  7.31 (L)  pCO2 arterial Latest Ref Range: 32.0 - 48.0 mmHg 33  pO2, Arterial Latest Ref Range: 83.0 - 108.0 mmHg 69 (L)  Acid-base deficit Latest Ref Range: 0.0 - 2.0 mmol/L 8.7 (H)  Bicarbonate Latest Ref Range: 20.0 - 28.0 mmol/L 16.6 (L)    Patient is oliguric, hyperkalemic and acidotic Will ask for temporary catheter Will start HD      LOS: 5 Sandra Massey 11/6/20227:49 AM

## 2021-01-15 DIAGNOSIS — R7881 Bacteremia: Secondary | ICD-10-CM | POA: Diagnosis not present

## 2021-01-15 DIAGNOSIS — I639 Cerebral infarction, unspecified: Secondary | ICD-10-CM

## 2021-01-15 DIAGNOSIS — B9620 Unspecified Escherichia coli [E. coli] as the cause of diseases classified elsewhere: Secondary | ICD-10-CM | POA: Diagnosis not present

## 2021-01-15 DIAGNOSIS — J9602 Acute respiratory failure with hypercapnia: Secondary | ICD-10-CM | POA: Diagnosis not present

## 2021-01-15 DIAGNOSIS — N170 Acute kidney failure with tubular necrosis: Secondary | ICD-10-CM

## 2021-01-15 DIAGNOSIS — A4151 Sepsis due to Escherichia coli [E. coli]: Secondary | ICD-10-CM | POA: Diagnosis not present

## 2021-01-15 DIAGNOSIS — R6520 Severe sepsis without septic shock: Secondary | ICD-10-CM | POA: Diagnosis not present

## 2021-01-15 DIAGNOSIS — R4182 Altered mental status, unspecified: Secondary | ICD-10-CM

## 2021-01-15 LAB — HEPATIC FUNCTION PANEL
ALT: 147 U/L — ABNORMAL HIGH (ref 0–44)
AST: 147 U/L — ABNORMAL HIGH (ref 15–41)
Albumin: 2.2 g/dL — ABNORMAL LOW (ref 3.5–5.0)
Alkaline Phosphatase: 387 U/L — ABNORMAL HIGH (ref 38–126)
Bilirubin, Direct: 0.1 mg/dL (ref 0.0–0.2)
Indirect Bilirubin: 1.7 mg/dL — ABNORMAL HIGH (ref 0.3–0.9)
Total Bilirubin: 1.8 mg/dL — ABNORMAL HIGH (ref 0.3–1.2)
Total Protein: 6 g/dL — ABNORMAL LOW (ref 6.5–8.1)

## 2021-01-15 LAB — HEPATITIS B SURFACE ANTIGEN: Hepatitis B Surface Ag: NONREACTIVE

## 2021-01-15 LAB — HEPATITIS B SURFACE ANTIBODY,QUALITATIVE: Hep B S Ab: NONREACTIVE

## 2021-01-15 LAB — BASIC METABOLIC PANEL
Anion gap: 18 — ABNORMAL HIGH (ref 5–15)
BUN: 57 mg/dL — ABNORMAL HIGH (ref 8–23)
CO2: 16 mmol/L — ABNORMAL LOW (ref 22–32)
Calcium: 6.9 mg/dL — ABNORMAL LOW (ref 8.9–10.3)
Chloride: 105 mmol/L (ref 98–111)
Creatinine, Ser: 4.62 mg/dL — ABNORMAL HIGH (ref 0.44–1.00)
GFR, Estimated: 10 mL/min — ABNORMAL LOW (ref >60)
Glucose, Bld: 99 mg/dL (ref 70–99)
Potassium: 5.8 mmol/L — ABNORMAL HIGH (ref 3.5–5.1)
Sodium: 139 mmol/L (ref 135–145)

## 2021-01-15 LAB — GLUCOSE, CAPILLARY
Glucose-Capillary: 107 mg/dL — ABNORMAL HIGH (ref 70–99)
Glucose-Capillary: 84 mg/dL (ref 70–99)
Glucose-Capillary: 94 mg/dL (ref 70–99)
Glucose-Capillary: 102 mg/dL — ABNORMAL HIGH (ref 70–99)

## 2021-01-15 MED ORDER — LIDOCAINE-PRILOCAINE 2.5-2.5 % EX CREA
1.0000 "application " | TOPICAL_CREAM | CUTANEOUS | Status: DC | PRN
  Filled 2021-01-15: qty 5

## 2021-01-15 MED ORDER — HEPARIN SODIUM (PORCINE) 1000 UNIT/ML IJ SOLN
INTRAMUSCULAR | Status: AC
  Filled 2021-01-15: qty 1

## 2021-01-15 MED ORDER — CEFAZOLIN SODIUM-DEXTROSE 2-4 GM/100ML-% IV SOLN
2.0000 g | Freq: Once | INTRAVENOUS | Status: AC
  Administered 2021-01-15: 2 g via INTRAVENOUS
  Filled 2021-01-15 (×3): qty 100

## 2021-01-15 MED ORDER — PENTAFLUOROPROP-TETRAFLUOROETH EX AERO
1.0000 "application " | INHALATION_SPRAY | CUTANEOUS | Status: DC | PRN
  Filled 2021-01-15: qty 30

## 2021-01-15 MED ORDER — CEFAZOLIN SODIUM-DEXTROSE 1-4 GM/50ML-% IV SOLN
1.0000 g | INTRAVENOUS | Status: DC
  Administered 2021-01-16 – 2021-01-18 (×3): 1 g via INTRAVENOUS
  Filled 2021-01-15 (×5): qty 50

## 2021-01-15 MED ORDER — SODIUM CHLORIDE 0.9 % IV SOLN: 100.0000 mL | INTRAVENOUS | Status: DC | PRN

## 2021-01-15 MED ORDER — LIDOCAINE HCL (PF) 1 % IJ SOLN
5.0000 mL | INTRAMUSCULAR | Status: DC | PRN
  Filled 2021-01-15: qty 5

## 2021-01-15 MED ORDER — HEPARIN SODIUM (PORCINE) 1000 UNIT/ML DIALYSIS
1000.0000 [IU] | INTRAMUSCULAR | Status: DC | PRN
  Filled 2021-01-15: qty 1

## 2021-01-15 MED ORDER — ALTEPLASE 2 MG IJ SOLR: 2.0000 mg | Freq: Once | INTRAMUSCULAR | Status: DC | PRN

## 2021-01-15 NOTE — Care Management Important Message (Signed)
Important Message  Patient Details  Name: Sandra Massey MRN: [id number] Date of Birth: [birth year]   Medicare Important Message Given:  Yes     Dannette Barbara 01/15/2021, 4:17 PM

## 2021-01-15 NOTE — Progress Notes (Addendum)
Patient is not cognitively aware enough to eat or take pill by mouth.   Patient is also combative all care.  I was able to do oral care a few times through out day.  Safety mitts are in place.  Patient biting at the mitts.

## 2021-01-15 NOTE — Progress Notes (Signed)
[facility]  ROUNDING NOTE   Subjective:   Patient seen and evaluated during dialysis   HEMODIALYSIS FLOWSHEET:  Blood Flow Rate (mL/min): 250 mL/min Arterial Pressure (mmHg): -90 mmHg Venous Pressure (mmHg): 80 mmHg Transmembrane Pressure (mmHg): 30 mmHg Ultrafiltration Rate (mL/min): 400 mL/min Dialysate Flow Rate (mL/min): 300 ml/min Conductivity: Machine : 13.8 Conductivity: Machine : 13.8  Remains confused and disoriented Moaning and yelling during treatment  Objective:  Vital signs in last 24 hours:  Temp:  [97.5 F (36.4 C)-98.3 F (36.8 C)] 97.9 F (36.6 C) (11/07 1351) Pulse Rate:  [65-106] 91 (11/07 1345) Resp:  [14-24] 19 (11/07 1345) BP: (93-130)/(49-87) 113/79 (11/07 1345) SpO2:  [90 %-100 %] 95 % (11/07 1345)  Weight change:  Filed Weights   01/09/21 1352  Weight: 136.1 kg    Intake/Output: I/O last 3 completed shifts: In: 712.4 [I.V.:612.4; IV Piggyback:100] Out: 200 [Urine:200]   Intake/Output this shift:  Total I/O In: 0  Out: 502 [Other:502]  Physical Exam: General: NAD, moaning  Head: Normocephalic, atraumatic. Moist oral mucosal membranes  Eyes: Anicteric  Lungs:  Clear to auscultation, normal effort  Heart: No rub, irregular rhythm  Abdomen:  Soft, nontender  Extremities:  trace peripheral edema.  Neurologic: Aphasic, alert, not following commands  Skin: No lesions  Access Rt femoral trialysis dialysis catheter    Basic Metabolic Panel: Recent Labs  Lab 01/10/21 0457 01/11/21 0606 01/12/21 0541 01/12/21 1200 01/13/21 0607 01/14/21 0547 01/15/21 0527  NA 138 138 140 140 139 139 139  K 4.5 4.6 4.8 4.9 5.0 5.2* 5.8*  CL 106 108 107 108 108 106 105  CO2 20* 20* 21* 18* 16* 14* 16*  GLUCOSE 68* 103* 118* 115* 145* 127* 99  BUN 42* 56* 63* 65* 72* 74* 57*  CREATININE 2.78* 3.29* 4.14* 4.27* 4.86* 5.35* 4.62*  CALCIUM 8.2* 7.5* 7.6* 7.5* 7.3* 7.2* 6.9*  MG 1.0* 1.5* 2.1  --  2.2 2.5*  --   PHOS  --   --   --    --  5.8*  --   --      Liver Function Tests: Recent Labs  Lab 01/09/21 1417 01/10/21 0009 01/11/21 0606 01/12/21 0541 01/12/21 1200  AST 87* 181* 244* 149* 132*  ALT 59* 189* 215* 178* 164*  ALKPHOS 112 62 90 137* 140*  BILITOT 1.0 1.0 0.9 1.1 0.9  PROT 6.2* 5.8* 5.2* 6.0* 5.6*  ALBUMIN 2.9* 2.7* 2.2* 2.4* 2.4*    No results for input(s): LIPASE, AMYLASE in the last 168 hours. No results for input(s): AMMONIA in the last 168 hours.  CBC: Recent Labs  Lab 01/09/21 1417 01/10/21 0009 01/10/21 0457 01/11/21 0606 01/12/21 0541 01/13/21 0607 01/14/21 0547  WBC 21.2* 33.3* 27.7* 17.0* 16.3* 12.7* 9.7  NEUTROABS 19.1* 28.9*  --   --   --   --   --   HGB 12.1 11.0* 10.6* 10.1* 10.3* 10.0* 9.9*  HCT 38.3 34.6* 32.9* 33.2* 33.1* 31.4* 31.5*  MCV 89.7 89.4 91.1 92.2 91.4 90.2 87.7  PLT 197 179 156 127* 161 154 184     Cardiac Enzymes: Recent Labs  Lab 01/13/21 0607  CKTOTAL 252*    BNP: Invalid input(s): POCBNP  CBG: Recent Labs  Lab 01/14/21 1108 01/14/21 1622 01/14/21 1648 01/14/21 2121 01/15/21 0944  GLUCAP 89 99 111* 92 107*     Microbiology: Results for orders placed or performed during the hospital encounter of 01/09/21  Resp Panel by RT-PCR (Flu A&B,  Covid) Nasopharyngeal Swab     Status: None   Collection Time: 01/09/21  2:17 PM   Specimen: Nasopharyngeal Swab; Nasopharyngeal(NP) swabs in vial transport medium  Result Value Ref Range Status   SARS Coronavirus 2 by RT PCR NEGATIVE NEGATIVE Final    Comment: (NOTE) SARS-CoV-2 target nucleic acids are NOT DETECTED.  The SARS-CoV-2 RNA is generally detectable in upper respiratory specimens during the acute phase of infection. The lowest concentration of SARS-CoV-2 viral copies this assay can detect is 138 copies/mL. A negative result does not preclude SARS-Cov-2 infection and should not be used as the sole basis for treatment or other patient management decisions. A negative result may occur  with  improper specimen collection/handling, submission of specimen other than nasopharyngeal swab, presence of viral mutation(s) within the areas targeted by this assay, and inadequate number of viral copies(<138 copies/mL). A negative result must be combined with clinical observations, patient history, and epidemiological information. The expected result is Negative.  Fact Sheet for Patients:  EntrepreneurPulse.com.au  Fact Sheet for Healthcare Providers:  IncredibleEmployment.be  This test is no t yet approved or cleared by the [place] FDA and  has been authorized for detection and/or diagnosis of SARS-CoV-2 by FDA under an Emergency Use Authorization (EUA). This EUA will remain  in effect (meaning this test can be used) for the duration of the COVID-19 declaration under Section 564(b)(1) of the Act, 21 U.S.C.section 360bbb-3(b)(1), unless the authorization is terminated  or revoked sooner.       Influenza A by PCR NEGATIVE NEGATIVE Final   Influenza B by PCR NEGATIVE NEGATIVE Final    Comment: (NOTE) The Xpert Xpress SARS-CoV-2/FLU/RSV plus assay is intended as an aid in the diagnosis of influenza from Nasopharyngeal swab specimens and should not be used as a sole basis for treatment. Nasal washings and aspirates are unacceptable for Xpert Xpress SARS-CoV-2/FLU/RSV testing.  Fact Sheet for Patients: EntrepreneurPulse.com.au  Fact Sheet for Healthcare Providers: IncredibleEmployment.be  This test is not yet approved or cleared by the [place] FDA and has been authorized for detection and/or diagnosis of SARS-CoV-2 by FDA under an Emergency Use Authorization (EUA). This EUA will remain in effect (meaning this test can be used) for the duration of the COVID-19 declaration under Section 564(b)(1) of the Act, 21 U.S.C. section 360bbb-3(b)(1), unless the authorization is terminated  or revoked.  Performed at [facility], [address] [place], [place] [address]   Culture, blood (Routine X 2) w Reflex to ID Panel     Status: Abnormal   Collection Time: 01/10/21 12:09 AM   Specimen: BLOOD  Result Value Ref Range Status   Specimen Description   Final    BLOOD RIGHT FOREARM Performed at [facility], [address] [place], [place] [address]    Special Requests   Final    BOTTLES DRAWN AEROBIC AND ANAEROBIC Blood Culture results may not be optimal due to an excessive volume of blood received in culture bottles Performed at [facility], [address] [place], [place] [address]    Culture  Setup Time   Final    GRAM NEGATIVE RODS IN BOTH AEROBIC AND ANAEROBIC BOTTLES CRITICAL RESULT CALLED TO, READ BACK BY AND VERIFIED WITH: PHARMD S HALLHAI [id number] AT 815 AM BY CM    Culture (A)  Final    ESCHERICHIA COLI SUSCEPTIBILITIES PERFORMED ON PREVIOUS CULTURE WITHIN THE LAST 5 DAYS. Performed at [facility], [place] [address], [place], [place] [address]  Report Status 01/13/2021 FINAL  Final  Culture, blood (Routine X 2) w Reflex to ID Panel     Status: Abnormal   Collection Time: 01/10/21 12:09 AM   Specimen: BLOOD  Result Value Ref Range Status   Specimen Description   Final    BLOOD LEFT ANTECUBITAL Performed at [facility], [place] [address], [place], [place] [address]    Special Requests   Final    BOTTLES DRAWN AEROBIC AND ANAEROBIC Blood Culture adequate volume Performed at [address] [place], [place] [address]    Culture  Setup Time   Final    GRAM NEGATIVE RODS IN BOTH AEROBIC AND ANAEROBIC BOTTLES CRITICAL RESULT CALLED TO, READ BACK BY AND VERIFIED WITH: RAQUEL GOODMAN, PHARMD AT 1350 ON 01/10/21 BY GM Performed at [facility], [place] [address], [place],  [address]    Culture ESCHERICHIA COLI (A)  Final   Report Status 01/13/2021 FINAL  Final   Organism ID,  Bacteria ESCHERICHIA COLI  Final      Susceptibility   Escherichia coli - MIC*    AMPICILLIN >=32 RESISTANT Resistant     CEFAZOLIN <=4 SENSITIVE Sensitive     CEFEPIME <=0.12 SENSITIVE Sensitive     CEFTAZIDIME <=1 SENSITIVE Sensitive     CEFTRIAXONE <=0.25 SENSITIVE Sensitive     CIPROFLOXACIN <=0.25 SENSITIVE Sensitive     GENTAMICIN 8 INTERMEDIATE Intermediate     IMIPENEM <=0.25 SENSITIVE Sensitive     TRIMETH/SULFA >=320 RESISTANT Resistant     AMPICILLIN/SULBACTAM >=32 RESISTANT Resistant     PIP/TAZO <=4 SENSITIVE Sensitive     * ESCHERICHIA COLI  Blood Culture ID Panel (Reflexed)     Status: Abnormal   Collection Time: 01/10/21 12:09 AM  Result Value Ref Range Status   Enterococcus faecalis NOT DETECTED NOT DETECTED Final   Enterococcus Faecium NOT DETECTED NOT DETECTED Final   Listeria monocytogenes NOT DETECTED NOT DETECTED Final   Staphylococcus species NOT DETECTED NOT DETECTED Final   Staphylococcus aureus (BCID) NOT DETECTED NOT DETECTED Final   Staphylococcus epidermidis NOT DETECTED NOT DETECTED Final   Staphylococcus lugdunensis NOT DETECTED NOT DETECTED Final   Streptococcus species NOT DETECTED NOT DETECTED Final   Streptococcus agalactiae NOT DETECTED NOT DETECTED Final   Streptococcus pneumoniae NOT DETECTED NOT DETECTED Final   Streptococcus pyogenes NOT DETECTED NOT DETECTED Final   A.calcoaceticus-baumannii NOT DETECTED NOT DETECTED Final   Bacteroides fragilis NOT DETECTED NOT DETECTED Final   Enterobacterales DETECTED (A) NOT DETECTED Final    Comment: Enterobacterales represent a large order of gram negative bacteria, not a single organism. CRITICAL RESULT CALLED TO, READ BACK BY AND VERIFIED WITH: [place], PHARMD AT 1350 ON 01/10/21 BY GM    Enterobacter cloacae complex NOT DETECTED NOT DETECTED Final   Escherichia coli DETECTED (A) NOT DETECTED Final    Comment: CRITICAL RESULT CALLED TO, READ BACK BY AND VERIFIED WITH: [place], PHARMD  AT 1350 ON 01/10/21 BY GM    Klebsiella aerogenes NOT DETECTED NOT DETECTED Final   Klebsiella oxytoca NOT DETECTED NOT DETECTED Final   Klebsiella pneumoniae NOT DETECTED NOT DETECTED Final   Proteus species NOT DETECTED NOT DETECTED Final   Salmonella species NOT DETECTED NOT DETECTED Final   Serratia marcescens NOT DETECTED NOT DETECTED Final   Haemophilus influenzae NOT DETECTED NOT DETECTED Final   Neisseria meningitidis NOT DETECTED NOT DETECTED Final   Pseudomonas aeruginosa NOT DETECTED NOT DETECTED Final   Stenotrophomonas  maltophilia NOT DETECTED NOT DETECTED Final   Candida albicans NOT DETECTED NOT DETECTED Final   Candida auris NOT DETECTED NOT DETECTED Final   Candida glabrata NOT DETECTED NOT DETECTED Final   Candida krusei NOT DETECTED NOT DETECTED Final   Candida parapsilosis NOT DETECTED NOT DETECTED Final   Candida tropicalis NOT DETECTED NOT DETECTED Final   Cryptococcus neoformans/gattii NOT DETECTED NOT DETECTED Final   CTX-M ESBL NOT DETECTED NOT DETECTED Final   Carbapenem resistance IMP NOT DETECTED NOT DETECTED Final   Carbapenem resistance KPC NOT DETECTED NOT DETECTED Final   Carbapenem resistance NDM NOT DETECTED NOT DETECTED Final   Carbapenem resist OXA 48 LIKE NOT DETECTED NOT DETECTED Final   Carbapenem resistance VIM NOT DETECTED NOT DETECTED Final    Comment: Performed at [facility], [address] [place], [place] [address]  Urine Culture     Status: Abnormal   Collection Time: 01/10/21  1:05 PM   Specimen: Urine, Random  Result Value Ref Range Status   Specimen Description   Final    URINE, RANDOM Performed at [facility], [address] [place], [place] [address]    Special Requests   Final    NONE Performed at [facility], [address] [place], [place] [address]    Culture MULTIPLE SPECIES PRESENT, SUGGEST RECOLLECTION (A)  Final   Report Status 01/11/2021 FINAL  Final    Coagulation Studies: No  results for input(s): LABPROT, INR in the last 72 hours.   Urinalysis: No results for input(s): COLORURINE, LABSPEC, PHURINE, GLUCOSEU, HGBUR, BILIRUBINUR, KETONESUR, PROTEINUR, UROBILINOGEN, NITRITE, LEUKOCYTESUR in the last 72 hours.  Invalid input(s): APPERANCEUR     Imaging: No results found.   Medications:    sodium chloride     sodium chloride      ceFAZolin (ANCEF) IV     levETIRAcetam 500 mg (01/14/21 2322)    amiodarone  200 mg Oral BID   apixaban  5 mg Oral BID   Chlorhexidine Gluconate Cloth  6 each Topical Daily   heparin sodium (porcine)       heparin sodium (porcine)  2,000 Units Intracatheter Once   insulin aspart  0-15 Units Subcutaneous TID WC   rosuvastatin  40 mg Oral QPM   sodium zirconium cyclosilicate  10 g Oral Once   sodium chloride, sodium chloride, acetaminophen (TYLENOL) oral liquid 160 mg/5 mL, ALPRAZolam, alteplase, docusate sodium, heparin, lidocaine (PF), lidocaine-prilocaine, ondansetron (ZOFRAN) IV, ondansetron, pentafluoroprop-tetrafluoroeth, polyethylene glycol  Assessment/ Plan:  [name] is a 70 y.o.  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  Acute Kidney Injury with hyperkalemia on chronic kidney disease stage 3B with baseline creatinine 1.58 and GFR of 35 on 06/26/20.  Acute kidney injury secondary to sepsis Urinalysis on January 09, 2021: Turbid sample, greater than 50 RBCs, greater than 50 WBCs.  Urine culture negative Renal ultrasound shows Right kidney 11.5 cm, volume 222 mL, diffuse increased echogenicity.  Left kidney 11.1 cm, volume 172 mL, small left renal cyst. Dialysis initiated yesterday. Appreciate vascular placing HD access. Currently receiving second treatment today on 2K bath to correct potassium. Will schedule third treatment tomorrow. Will  request patient be placed in chair. Primary tem to consult palliative care for goal of care discussion.   Lab Results  Component Value Date   CREATININE 4.62 (H)  01/15/2021   CREATININE 5.35 (H) 01/14/2021   CREATININE 4.86 (H) 01/13/2021    Intake/Output Summary (Last 24 hours) at 01/15/2021 1357 Last data filed at 01/15/2021 1337 Gross per 24 hour  Intake 100 ml  Output 552 ml  Net -452 ml    2. Sepsis from E. Coli bacteremia Blood cultures positive on 01/10/21 for Enterobacterials, E. Coli Currently on cefazolin   LOS: 6   11/7/20221:57 PM

## 2021-01-15 NOTE — Progress Notes (Signed)
Earlier in the shift, patient making non-comprehensible sounds but unable to answer orientation questions. Attempted to give patient small sip of water but patient refused by shaking her head no. On call provider notified of unable to give shc oral medication Eliquis and amiodarone this evening.   At 2255, patient was little better with response and stated her name when asked. When asked, if patient willing to take medication, patient stated "ok". Care nurse pulled meds and once again attempted with asking patient to take one sip of water, and patient became agitated and started stating "I can't" and shaking her head no. Once again provider notified of failed attempt.

## 2021-01-15 NOTE — Progress Notes (Signed)
ID Started dialysis over weekend Not screaming this afternoon  Awake but confused No appropriate verbal response BP 108/84 (BP Location: Right Arm)   Pulse 68   Temp (!) 97.2 F (36.2 C)   Resp 16   Ht 5\' 7"  (1.702 m)   Wt 136.1 kg   SpO2 97%   BMI 46.99 kg/m   Chest b/l air entry Hss1s2  Abd soft Rt hemiparesis Edema rt > left lower extremity Rt femoral HD catheter Urethral catheter   Labs CBC Latest Ref Rng & Units 01/14/2021 01/13/2021 01/12/2021  WBC 4.0 - 10.5 K/uL 9.7 12.7(H) 16.3(H)  Hemoglobin 12.0 - 15.0 g/dL 9.9(L) 10.0(L) 10.3(L)  Hematocrit 36.0 - 46.0 % 31.5(L) 31.4(L) 33.1(L)  Platelets 150 - 400 K/uL 184 154 161    CMP Latest Ref Rng & Units 01/15/2021 01/14/2021 01/13/2021  Glucose 70 - 99 mg/dL 99 127(H) 145(H)  BUN 8 - 23 mg/dL 57(H) 74(H) 72(H)  Creatinine 0.44 - 1.00 mg/dL 4.62(H) 5.35(H) 4.86(H)  Sodium 135 - 145 mmol/L 139 139 139  Potassium 3.5 - 5.1 mmol/L 5.8(H) 5.2(H) 5.0  Chloride 98 - 111 mmol/L 105 106 108  CO2 22 - 32 mmol/L 16(L) 14(L) 16(L)  Calcium 8.9 - 10.3 mg/dL 6.9(L) 7.2(L) 7.3(L)  Total Protein 6.5 - 8.1 g/dL - - -  Total Bilirubin 0.3 - 1.2 mg/dL - - -  Alkaline Phos 38 - 126 U/L - - -  AST 15 - 41 U/L - - -  ALT 0 - 44 U/L - - -    Micro 01/10/21- BC- e.coli  Impression/recommendation E. coli bacteremia with UTI Rt pyelonephritis on [place] Was on ceftriaxone- changed to cefazolin yesterday Continue until 01/20/21     H/o Left MCA infarct with rt hemiplegia and expressive aphasia Left encephalomalacia     EEG showed evidence of epileptiform waves left post temp region On keppra Unclear baseline mentation and cognitive function   Worsening AKI Started on HD   Transaminitis- i   Afib on amiodarone   DM on insulin  Discussed the management with care team

## 2021-01-15 NOTE — Progress Notes (Signed)
[place] [facility] [place] NAME: Sandra Massey    MR#:  [id number]  [place]aka:  1951-01-03  SUBJECTIVE:   patient seen at dialysis. She is very confused she is yelling screaming restless. No family in her hospital room. Left message for [facility] back. Underwent baseline mentation. Patient unable to give any history review of systems. REVIEW OF SYSTEMS:   Review of Systems  Unable to perform ROS: Mental acuity  Tolerating Diet: Tolerating PT:   DRUG ALLERGIES:   Allergies  Allergen Reactions  . Latex Rash    VITALS:  Blood pressure 113/79, pulse 91, temperature 97.9 F (36.6 C), temperature source Axillary, resp. rate 19, height 5\' 7"  (1.702 m), weight 136.1 kg, SpO2 95 %.  PHYSICAL EXAMINATION:   Physical Exam Ted exam due to  mental status  GENERAL:  70 y.o.-year-old patient lying in the bed with no acute distress. Confused, yelling, screaming HEENT: Head atraumatic, normocephalic. Oropharynx and nasopharynx clear.  LUNGS: Normal breath sounds bilaterally CARDIOVASCULAR: S1, S2 normal. No murmurs, rubs, or gallops.  EXTREMITIES: No cyanosis, clubbing or edema b/l.    NEUROLOGIC: nonfocal, chronic hemiparesis from old CVA PSYCHIATRIC:  patient is yelling and restless SKIN: per RN  LABORATORY PANEL:  CBC Recent Labs  Lab 01/14/21 0547  WBC 9.7  HGB 9.9*  HCT 31.5*  PLT 184    Chemistries  Recent Labs  Lab 01/12/21 1200 01/13/21 0607 01/14/21 0547 01/15/21 0527  NA 140   < > 139 139  K 4.9   < > 5.2* 5.8*  CL 108   < > 106 105  CO2 18*   < > 14* 16*  GLUCOSE 115*   < > 127* 99  BUN 65*   < > 74* 57*  CREATININE 4.27*   < > 5.35* 4.62*  CALCIUM 7.5*   < > 7.2* 6.9*  MG  --    < > 2.5*  --   AST 132*  --   --   --   ALT 164*  --   --   --   ALKPHOS 140*  --   --   --   BILITOT 0.9  --   --   --    < > = values in this interval not displayed.   Cardiac Enzymes No results for input(s): TROPONINI in the last  168 hours. RADIOLOGY:  No results found. 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  Head CT did not show any findings, neurology consult was obtained who recommended a stroke work-up EEG showed evidence of epileptiform waves arising in the left posterior temporal region.  Patient was started on Keppra 500 mg twice a day..  Patient became hypotensive requiring IV fluid blood cultures grew E. coli she was started on ceftriaxone IV nephrology was consulted for acute on chronic kidney disease.  Severe sepsis present on admission secondary to E. coli bacteremia source appears here -- seen by infectious disease continue IV Rocephin -- blood pressure stable -- white count improving  acute metabolic encephalopathy with history of expressive aphasia seizure disorder -- patient has history of left MCA territory infarct with right sided hemiparesis and was noted to have changed her speech with cognitive deficit  at her long-term facility -- seen by neurology-- EEG showed evidence of epileptiform waves arising from the left posterior temporal region -- patient on IV Keppra -- seizure precaution  acute on chronic kidney injury stage IIIB-- worsening renal function and poor urine output -- Dr Theador Hawthorne-- started pt on HD 11/6  chronic atrial fibrillation on eliquis -- continue amiodarone  cognitive deficit -- unsure about patient's baseline. Unable to reach any family members today --?? Early dementia  patient overall care is a poor prognosis  Procedures: Family communication : unable to reach family today Consults : ID, nephrology CODE STATUS: DNR DNI DVT Prophylaxis : heparin Level of care: Progressive  Cardiac Status is: Inpatient  Remains inpatient appropriate because: multiple medical issues. Patient just got started on dialysis        TOTAL TIME TAKING CARE OF THIS PATIENT: 25 minutes.  >50% time spent on counselling and coordination of care  Note: This dictation was prepared with Dragon dictation along with smaller phrase technology. Any transcriptional errors that result from this process are unintentional.  Fritzi Mandes M.D    [facility]   CC: Primary care physician; [name], MD Patient ID: Sandra Massey, female   DOB: 01/31/1951, 70 y.o.   MRN: [id number]

## 2021-01-16 DIAGNOSIS — R6520 Severe sepsis without septic shock: Secondary | ICD-10-CM | POA: Diagnosis not present

## 2021-01-16 DIAGNOSIS — R410 Disorientation, unspecified: Secondary | ICD-10-CM | POA: Diagnosis present

## 2021-01-16 DIAGNOSIS — A4151 Sepsis due to Escherichia coli [E. coli]: Secondary | ICD-10-CM | POA: Diagnosis not present

## 2021-01-16 DIAGNOSIS — J9602 Acute respiratory failure with hypercapnia: Secondary | ICD-10-CM | POA: Diagnosis not present

## 2021-01-16 DIAGNOSIS — N170 Acute kidney failure with tubular necrosis: Secondary | ICD-10-CM | POA: Diagnosis not present

## 2021-01-16 LAB — BASIC METABOLIC PANEL
Anion gap: 18 — ABNORMAL HIGH (ref 5–15)
BUN: 37 mg/dL — ABNORMAL HIGH (ref 8–23)
CO2: 21 mmol/L — ABNORMAL LOW (ref 22–32)
Calcium: 6.9 mg/dL — ABNORMAL LOW (ref 8.9–10.3)
Chloride: 100 mmol/L (ref 98–111)
Creatinine, Ser: 3.67 mg/dL — ABNORMAL HIGH (ref 0.44–1.00)
GFR, Estimated: 13 mL/min — ABNORMAL LOW (ref >60)
Glucose, Bld: 127 mg/dL — ABNORMAL HIGH (ref 70–99)
Potassium: 3.6 mmol/L (ref 3.5–5.1)
Sodium: 139 mmol/L (ref 135–145)

## 2021-01-16 LAB — HEPARIN LEVEL (UNFRACTIONATED)
Heparin Unfractionated: 0.84 IU/mL — ABNORMAL HIGH (ref 0.30–0.70)
Heparin Unfractionated: 0.57 IU/mL (ref 0.30–0.70)

## 2021-01-16 LAB — GLUCOSE, CAPILLARY
Glucose-Capillary: 147 mg/dL — ABNORMAL HIGH (ref 70–99)
Glucose-Capillary: 109 mg/dL — ABNORMAL HIGH (ref 70–99)
Glucose-Capillary: 138 mg/dL — ABNORMAL HIGH (ref 70–99)

## 2021-01-16 LAB — HEPATITIS B SURFACE ANTIBODY, QUANTITATIVE: Hep B S AB Quant (Post): <3.1 m[IU]/mL — ABNORMAL LOW (ref >9.9)

## 2021-01-16 MED ORDER — HEPARIN BOLUS VIA INFUSION
5000.0000 [IU] | Freq: Once | INTRAVENOUS | Status: AC
  Administered 2021-01-16: 5000 [IU] via INTRAVENOUS
  Filled 2021-01-16: qty 5000

## 2021-01-16 MED ORDER — SODIUM CHLORIDE 0.9 % IV SOLN: INTRAVENOUS | Status: DC | PRN

## 2021-01-16 MED ORDER — HEPARIN SODIUM (PORCINE) 1000 UNIT/ML IJ SOLN
INTRAMUSCULAR | Status: AC
  Filled 2021-01-16: qty 4

## 2021-01-16 MED ORDER — HEPARIN (PORCINE) 25000 UT/250ML-% IV SOLN
1300.0000 [IU]/h | INTRAVENOUS | Status: DC
Start: 2021-01-16 — End: 2021-01-17
  Administered 2021-01-16: 1500 [IU]/h via INTRAVENOUS
  Administered 2021-01-17: 1300 [IU]/h via INTRAVENOUS
  Filled 2021-01-16 (×3): qty 250

## 2021-01-16 MED ORDER — HALOPERIDOL LACTATE 5 MG/ML IJ SOLN
1.0000 mg | Freq: Four times a day (QID) | INTRAMUSCULAR | Status: DC | PRN
  Administered 2021-01-20 – 2021-01-21 (×4): 1 mg via INTRAVENOUS
  Filled 2021-01-16 (×4): qty 1

## 2021-01-16 NOTE — Significant Event (Signed)
Patient refusing oral medications despite several attempts.  Discussed with pharmacy and at this time plan is to keep patient on heparin infusion and hold apixaban.  Patient can reliably take orally.  Patient was on apixaban for A. fib.  Gean Birchwood

## 2021-01-16 NOTE — Progress Notes (Signed)
Pt evaluate by Psychiatry Dr Weber Cooks  and currently does NOT have capacity to make decisions.

## 2021-01-16 NOTE — Progress Notes (Signed)
ANTICOAGULATION CONSULT NOTE - Initial Consult  Pharmacy Consult for Heparin  Indication: atrial fibrillation  Allergies  Allergen Reactions   Latex Rash    Patient Measurements: Height: 5\' 7"  (170.2 cm) Weight: 136.1 kg (300 lb) IBW/kg (Calculated) : 61.6 Heparin Dosing Weight: 94.7 kg   Vital Signs: Temp: 97.7 F (36.5 C) (11/08 0051) Temp Source: Oral (11/07 1450) BP: 119/82 (11/08 0051) Pulse Rate: 71 (11/08 0051)  Labs: Recent Labs    01/13/21 0607 01/14/21 0547 01/15/21 0527  HGB 10.0* 9.9*  --   HCT 31.4* 31.5*  --   PLT 154 184  --   CREATININE 4.86* 5.35* 4.62*  CKTOTAL 252*  --   --     Estimated Creatinine Clearance: 16.8 mL/min (A) (by C-G formula based on SCr of 4.62 mg/dL (H)).   Medical History: Past Medical History:  Diagnosis Date   Arthritis    Chronic atrial fibrillation (HCC)    CKD (chronic kidney disease), stage III (HCC)    Diabetes mellitus without complication ([place])    History of ischemic cerebrovascular accident (CVA) with residual deficit    Hypertension    Substance abuse ([place])     Medications:  Medications Prior to Admission  Medication Sig Dispense Refill Last Dose   amiodarone (PACERONE) 200 MG tablet Take 1 tablet (200 mg total) by mouth 2 (two) times daily. 60 tablet 0 01/09/2021 at 0900   amLODipine (NORVASC) 10 MG tablet Take 1 tablet (10 mg total) by mouth daily. 30 tablet 11 01/09/2021 at 0900   cetirizine (ZYRTEC) 5 MG tablet Take 5 mg by mouth daily.   01/09/2021 at 0900   ELIQUIS 5 MG TABS tablet Take 5 mg by mouth 2 (two) times daily.   01/09/2021 at 0900   LEVEMIR FLEXTOUCH 100 UNIT/ML FlexTouch Pen Inject 20 Units into the skin daily at 12 noon.   01/09/2021 at 0900   metFORMIN (GLUCOPHAGE) 1000 MG tablet Take 1,000 mg by mouth 2 (two) times daily.   01/09/2021 at 0900   Multiple Vitamin (MULTIVITAMIN WITH MINERALS) TABS tablet Take 1 tablet by mouth daily.   01/09/2021 at 0900   NOVOLIN R 100 UNIT/ML injection Inject  2-10 Units into the skin 4 (four) times daily -  before meals and at bedtime.   01/09/2021 at 1130   rosuvastatin (CRESTOR) 40 MG tablet Take 40 mg by mouth every evening.   01/08/2021 at 1800   ascorbic acid (VITAMIN C) 500 MG tablet Take 500 mg by mouth daily. (Patient not taking: Reported on 01/09/2021)   Not Taking   insulin aspart (NOVOLOG) 100 UNIT/ML FlexPen Inject 20 Units into the skin 3 (three) times daily with meals. (Patient not taking: Reported on 01/09/2021)   Not Taking   insulin glargine (LANTUS) 100 UNIT/ML Solostar Pen Inject 32 Units into the skin daily. (Patient not taking: No sig reported) 10 mL  Not Taking   Insulin Pen Needle 31G X 8 MM MISC 1 needle with each injection of insulin 100 each 0    sertraline (ZOLOFT) 50 MG tablet Take 1 tablet (50 mg total) by mouth daily. (Patient not taking: No sig reported) 5 tablet 0 Not Taking    Assessment: Pharmacy consulted to dose heparin for Afib in this 70 year old female.  Pt was on Eliquis PTA but is currently refusing Eliquis.  Last dose was on 11/1 @ 0900.  CrCl = 16.8 ml/min   Goal of Therapy:  Heparin level 0.3-0.7 units/ml Monitor platelets by  anticoagulation protocol: Yes   Plan:  Give 5000 units bolus x 1 Start heparin infusion at 1500 units/hr Check anti-Xa level in 8 hours and daily while on heparin Continue to monitor H&H and platelets  Kahlie Deutscher D 01/16/2021,1:17 AM

## 2021-01-16 NOTE — TOC Initial Note (Signed)
Transition of Care [facility]) - Initial/Assessment Note    Patient Details  Name: Sandra Massey MRN: [id number] Date of Birth: Jan 03, 1951  Transition of Care [facility]) CM/SW Contact:    Sandra Sam, LCSW Phone Number: 01/16/2021, 1:48 PM  Clinical Narrative:                  CSW notes patient is from [facility], patient currently disoriented x4 and unable to hold meaningful conversation.   CSW has attempted to call all phone numbers and family contacts listed for patient in chart, lvm for Sandra Massey on mobile and home number. Brother Sandra Massey number is out of service.  CSW spoke with Sandra Guthrie, RN at [facility] at [phone] who has cared for patient. Reports at baseline patient is oriented x4, arguing and laughing with her sister Sandra Massey who is also at [facility] (Room [phone], [phone]). Kim reports patient's current states is not normal for her, as she was also up and in the chair at times able to hold conversations.   Per Maudie Mercury, patient's sister Sandra Massey is oriented x4, CSW attempted to call her room at [facility] to see if she identifies as decision maker, unable to get answer a this time.   MD to consult palliative and psych to determine appropriate dispo.    Expected Discharge Plan:  (TBD) Barriers to Discharge: Continued Medical Work up   Patient Goals and CMS Choice   CMS Medicare.gov Compare Post Acute Care list provided to:: Patient Represenative (must comment) ([facility]) Choice offered to / list presented to : NA  Expected Discharge Plan and Services Expected Discharge Plan:  (TBD)       Living arrangements for the past 2 months: [place]                                      Prior Living Arrangements/Services Living arrangements for the past 2 months: [place] Lives with:: Facility Resident                   Activities of Daily Living Home Assistive Devices/Equipment: None    Permission Sought/Granted                   Emotional Assessment       Orientation: :  (disoriented x4) Alcohol / Substance Use: Not Applicable Psych Involvement: No (comment)  Admission diagnosis:  UTI (urinary tract infection) [N39.0] Speech abnormality [R47.9] MRI contraindicated due to metal implant [Z53.09] Sepsis ([place]) [A41.9] Cerebrovascular accident (CVA), unspecified mechanism ([place]) [I63.9] AMS (altered mental status) [R41.82] Patient Active Problem List   Diagnosis Date Noted   Cerebrovascular accident (CVA) ([place])    Altered mental status    Speech abnormality 01/09/2021   Sepsis ([place]) 01/09/2021   AKI (acute kidney injury) ([place]) 06/24/2020   Acute kidney injury superimposed on chronic kidney disease ([place]) 06/23/2020   Hypoglycemia associated with diabetes ([place]) 06/23/2020   Hypokalemia 06/23/2020   Chronic atrial fibrillation ([place])    CKD (chronic kidney disease), stage III ([place])    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 ([address]), [address] [place]) [place] [address] Phone: [phone] Fax: [phone]  [facility] [address] [place] [address] Phone: [phone]      Social Determinants of Health (SDOH) Interventions    Readmission Risk Interventions No flowsheet data found.

## 2021-01-16 NOTE — Progress Notes (Signed)
[facility]  ROUNDING NOTE   Subjective:   Patient seen and evaluated during dialysis   HEMODIALYSIS FLOWSHEET:  Blood Flow Rate (mL/min): 300 mL/min Arterial Pressure (mmHg): -110 mmHg Venous Pressure (mmHg): 80 mmHg Transmembrane Pressure (mmHg): 60 mmHg Ultrafiltration Rate (mL/min): 330 mL/min Dialysate Flow Rate (mL/min): 300 ml/min Conductivity: Machine : 13.8 Conductivity: Machine : 13.8 Dialysis Fluid Bolus: Normal Saline Bolus Amount (mL): 250 mL  Intermittently yelling out during treatment No complaints of pain or discomfort  Objective:  Vital signs in last 24 hours:  Temp:  [97.2 F (36.2 C)-98 F (36.7 C)] 97.3 F (36.3 C) (11/08 0900) Pulse Rate:  [49-91] 79 (11/08 1045) Resp:  [14-24] 22 (11/08 1045) BP: (105-134)/(63-112) 126/112 (11/08 1030) SpO2:  [90 %-100 %] 98 % (11/08 1045) Weight:  [111.1 kg] 111.1 kg (11/08 0900)  Weight change:  Filed Weights   01/09/21 1352 01/16/21 0900  Weight: 136.1 kg 111.1 kg    Intake/Output: I/O last 3 completed shifts: In: 283.7 [I.V.:83.7; IV Piggyback:200] Out: 502 [Other:502]   Intake/Output this shift:  No intake/output data recorded.  Physical Exam: General: NAD, moaning/yelling  Head: Normocephalic, atraumatic. Moist oral mucosal membranes  Eyes: Anicteric  Lungs:  Clear to auscultation, normal effort  Heart: No rub, irregular rhythm  Abdomen:  Soft, nontender  Extremities:  trace peripheral edema.  Neurologic: Aphasic, alert, not following commands  Skin: No lesions  Access Rt femoral trialysis dialysis catheter    Basic Metabolic Panel: Recent Labs  Lab 01/10/21 0457 01/11/21 0606 01/12/21 0541 01/12/21 1200 01/13/21 0607 01/14/21 0547 01/15/21 0527 01/16/21 0514  NA 138 138 140 140 139 139 139 139  K 4.5 4.6 4.8 4.9 5.0 5.2* 5.8* 3.6  CL 106 108 107 108 108 106 105 100  CO2 20* 20* 21* 18* 16* 14* 16* 21*  GLUCOSE 68* 103* 118* 115* 145* 127* 99 127*  BUN 42* 56* 63*  65* 72* 74* 57* 37*  CREATININE 2.78* 3.29* 4.14* 4.27* 4.86* 5.35* 4.62* 3.67*  CALCIUM 8.2* 7.5* 7.6* 7.5* 7.3* 7.2* 6.9* 6.9*  MG 1.0* 1.5* 2.1  --  2.2 2.5*  --   --   PHOS  --   --   --   --  5.8*  --   --   --      Liver Function Tests: Recent Labs  Lab 01/10/21 0009 01/11/21 0606 01/12/21 0541 01/12/21 1200 01/15/21 2254  AST 181* 244* 149* 132* 147*  ALT 189* 215* 178* 164* 147*  ALKPHOS 62 90 137* 140* 387*  BILITOT 1.0 0.9 1.1 0.9 1.8*  PROT 5.8* 5.2* 6.0* 5.6* 6.0*  ALBUMIN 2.7* 2.2* 2.4* 2.4* 2.2*    No results for input(s): LIPASE, AMYLASE in the last 168 hours. No results for input(s): AMMONIA in the last 168 hours.  CBC: Recent Labs  Lab 01/09/21 1417 01/10/21 0009 01/10/21 0457 01/11/21 0606 01/12/21 0541 01/13/21 0607 01/14/21 0547  WBC 21.2* 33.3* 27.7* 17.0* 16.3* 12.7* 9.7  NEUTROABS 19.1* 28.9*  --   --   --   --   --   HGB 12.1 11.0* 10.6* 10.1* 10.3* 10.0* 9.9*  HCT 38.3 34.6* 32.9* 33.2* 33.1* 31.4* 31.5*  MCV 89.7 89.4 91.1 92.2 91.4 90.2 87.7  PLT 197 179 156 127* 161 154 184     Cardiac Enzymes: Recent Labs  Lab 01/13/21 0607  CKTOTAL 252*     BNP: Invalid input(s): POCBNP  CBG: Recent Labs  Lab 01/15/21 0944 01/15/21 1458  01/15/21 1629 01/15/21 2036 01/16/21 0746  GLUCAP 107* 84 94 102* 147*     Microbiology: Results for orders placed or performed during the hospital encounter of 01/09/21  Resp Panel by RT-PCR (Flu A&B, Covid) Nasopharyngeal Swab     Status: None   Collection Time: 01/09/21  2:17 PM   Specimen: Nasopharyngeal Swab; Nasopharyngeal(NP) swabs in vial transport medium  Result Value Ref Range Status   SARS Coronavirus 2 by RT PCR NEGATIVE NEGATIVE Final    Comment: (NOTE) SARS-CoV-2 target nucleic acids are NOT DETECTED.  The SARS-CoV-2 RNA is generally detectable in upper respiratory specimens during the acute phase of infection. The lowest concentration of SARS-CoV-2 viral copies this assay can  detect is 138 copies/mL. A negative result does not preclude SARS-Cov-2 infection and should not be used as the sole basis for treatment or other patient management decisions. A negative result may occur with  improper specimen collection/handling, submission of specimen other than nasopharyngeal swab, presence of viral mutation(s) within the areas targeted by this assay, and inadequate number of viral copies(<138 copies/mL). A negative result must be combined with clinical observations, patient history, and epidemiological information. The expected result is Negative.  Fact Sheet for Patients:  EntrepreneurPulse.com.au  Fact Sheet for Healthcare Providers:  IncredibleEmployment.be  This test is no t yet approved or cleared by the [place] FDA and  has been authorized for detection and/or diagnosis of SARS-CoV-2 by FDA under an Emergency Use Authorization (EUA). This EUA will remain  in effect (meaning this test can be used) for the duration of the COVID-19 declaration under Section 564(b)(1) of the Act, 21 U.S.C.section 360bbb-3(b)(1), unless the authorization is terminated  or revoked sooner.       Influenza A by PCR NEGATIVE NEGATIVE Final   Influenza B by PCR NEGATIVE NEGATIVE Final    Comment: (NOTE) The Xpert Xpress SARS-CoV-2/FLU/RSV plus assay is intended as an aid in the diagnosis of influenza from Nasopharyngeal swab specimens and should not be used as a sole basis for treatment. Nasal washings and aspirates are unacceptable for Xpert Xpress SARS-CoV-2/FLU/RSV testing.  Fact Sheet for Patients: EntrepreneurPulse.com.au  Fact Sheet for Healthcare Providers: IncredibleEmployment.be  This test is not yet approved or cleared by the [place] FDA and has been authorized for detection and/or diagnosis of SARS-CoV-2 by FDA under an Emergency Use Authorization (EUA). This EUA will remain in  effect (meaning this test can be used) for the duration of the COVID-19 declaration under Section 564(b)(1) of the Act, 21 U.S.C. section 360bbb-3(b)(1), unless the authorization is terminated or revoked.  Performed at [facility], [address] [place], [place] [address]   Culture, blood (Routine X 2) w Reflex to ID Panel     Status: Abnormal   Collection Time: 01/10/21 12:09 AM   Specimen: BLOOD  Result Value Ref Range Status   Specimen Description   Final    BLOOD RIGHT FOREARM Performed at [facility], [address] [place], [place] [address]    Special Requests   Final    BOTTLES DRAWN AEROBIC AND ANAEROBIC Blood Culture results may not be optimal due to an excessive volume of blood received in culture bottles Performed at [facility], [address], [place], [place] [address]    Culture  Setup Time   Final    GRAM NEGATIVE RODS IN BOTH AEROBIC AND ANAEROBIC BOTTLES CRITICAL RESULT CALLED TO, READ BACK BY AND VERIFIED WITH: [place] [id number] AT 815 AM  BY CM    Culture (A)  Final    ESCHERICHIA COLI SUSCEPTIBILITIES PERFORMED ON PREVIOUS CULTURE WITHIN THE LAST 5 DAYS. Performed at [facility], [place] [address] [place], [place] [address]    Report Status 01/13/2021 FINAL  Final  Culture, blood (Routine X 2) w Reflex to ID Panel     Status: Abnormal   Collection Time: 01/10/21 12:09 AM   Specimen: BLOOD  Result Value Ref Range Status   Specimen Description   Final    BLOOD LEFT ANTECUBITAL Performed at [facility], [place] [address], [place], [place] [address]    Special Requests   Final    BOTTLES DRAWN AEROBIC AND ANAEROBIC Blood Culture adequate volume Performed at [place] [place], [place] [address]    Culture  Setup Time   Final    GRAM NEGATIVE RODS IN BOTH AEROBIC AND ANAEROBIC BOTTLES CRITICAL RESULT CALLED TO, READ BACK BY AND VERIFIED WITH: RAQUEL GOODMAN, PHARMD AT 1350 ON  01/10/21 BY GM Performed at [facility], [place] [address], [place], [place] [address]    Culture ESCHERICHIA COLI (A)  Final   Report Status 01/13/2021 FINAL  Final   Organism ID, Bacteria ESCHERICHIA COLI  Final      Susceptibility   Escherichia coli - MIC*    AMPICILLIN >=32 RESISTANT Resistant     CEFAZOLIN <=4 SENSITIVE Sensitive     CEFEPIME <=0.12 SENSITIVE Sensitive     CEFTAZIDIME <=1 SENSITIVE Sensitive     CEFTRIAXONE <=0.25 SENSITIVE Sensitive     CIPROFLOXACIN <=0.25 SENSITIVE Sensitive     GENTAMICIN 8 INTERMEDIATE Intermediate     IMIPENEM <=0.25 SENSITIVE Sensitive     TRIMETH/SULFA >=320 RESISTANT Resistant     AMPICILLIN/SULBACTAM >=32 RESISTANT Resistant     PIP/TAZO <=4 SENSITIVE Sensitive     * ESCHERICHIA COLI  Blood Culture ID Panel (Reflexed)     Status: Abnormal   Collection Time: 01/10/21 12:09 AM  Result Value Ref Range Status   Enterococcus faecalis NOT DETECTED NOT DETECTED Final   Enterococcus Faecium NOT DETECTED NOT DETECTED Final   Listeria monocytogenes NOT DETECTED NOT DETECTED Final   Staphylococcus species NOT DETECTED NOT DETECTED Final   Staphylococcus aureus (BCID) NOT DETECTED NOT DETECTED Final   Staphylococcus epidermidis NOT DETECTED NOT DETECTED Final   Staphylococcus lugdunensis NOT DETECTED NOT DETECTED Final   Streptococcus species NOT DETECTED NOT DETECTED Final   Streptococcus agalactiae NOT DETECTED NOT DETECTED Final   Streptococcus pneumoniae NOT DETECTED NOT DETECTED Final   Streptococcus pyogenes NOT DETECTED NOT DETECTED Final   A.calcoaceticus-baumannii NOT DETECTED NOT DETECTED Final   Bacteroides fragilis NOT DETECTED NOT DETECTED Final   Enterobacterales DETECTED (A) NOT DETECTED Final    Comment: Enterobacterales represent a large order of gram negative bacteria, not a single organism. CRITICAL RESULT CALLED TO, READ BACK BY AND VERIFIED WITH: RAQUEL GOODMAN, PHARMD AT 1350 ON 01/10/21 BY GM    Enterobacter  cloacae complex NOT DETECTED NOT DETECTED Final   Escherichia coli DETECTED (A) NOT DETECTED Final    Comment: CRITICAL RESULT CALLED TO, READ BACK BY AND VERIFIED WITH: [place], PHARMD AT 1350 ON 01/10/21 BY GM    Klebsiella aerogenes NOT DETECTED NOT DETECTED Final   Klebsiella oxytoca NOT DETECTED NOT DETECTED Final   Klebsiella pneumoniae NOT DETECTED NOT DETECTED Final   Proteus species NOT DETECTED NOT DETECTED Final   Salmonella species NOT DETECTED NOT DETECTED  Final   Serratia marcescens NOT DETECTED NOT DETECTED Final   Haemophilus influenzae NOT DETECTED NOT DETECTED Final   Neisseria meningitidis NOT DETECTED NOT DETECTED Final   Pseudomonas aeruginosa NOT DETECTED NOT DETECTED Final   Stenotrophomonas maltophilia NOT DETECTED NOT DETECTED Final   Candida albicans NOT DETECTED NOT DETECTED Final   Candida auris NOT DETECTED NOT DETECTED Final   Candida glabrata NOT DETECTED NOT DETECTED Final   Candida krusei NOT DETECTED NOT DETECTED Final   Candida parapsilosis NOT DETECTED NOT DETECTED Final   Candida tropicalis NOT DETECTED NOT DETECTED Final   Cryptococcus neoformans/gattii NOT DETECTED NOT DETECTED Final   CTX-M ESBL NOT DETECTED NOT DETECTED Final   Carbapenem resistance IMP NOT DETECTED NOT DETECTED Final   Carbapenem resistance KPC NOT DETECTED NOT DETECTED Final   Carbapenem resistance NDM NOT DETECTED NOT DETECTED Final   Carbapenem resist OXA 48 LIKE NOT DETECTED NOT DETECTED Final   Carbapenem resistance VIM NOT DETECTED NOT DETECTED Final    Comment: Performed at [facility], [address], [place], [place] [address]  Urine Culture     Status: Abnormal   Collection Time: 01/10/21  1:05 PM   Specimen: Urine, Random  Result Value Ref Range Status   Specimen Description   Final    URINE, RANDOM Performed at [facility], [address], [place], [place] [address]    Special Requests   Final    NONE Performed at [facility], [address] [place], [place] [address]    Culture MULTIPLE SPECIES PRESENT, SUGGEST RECOLLECTION (A)  Final   Report Status 01/11/2021 FINAL  Final    Coagulation Studies: No results for input(s): LABPROT, INR in the last 72 hours.   Urinalysis: No results for input(s): COLORURINE, LABSPEC, PHURINE, GLUCOSEU, HGBUR, BILIRUBINUR, KETONESUR, PROTEINUR, UROBILINOGEN, NITRITE, LEUKOCYTESUR in the last 72 hours.  Invalid input(s): APPERANCEUR     Imaging: No results found.   Medications:     ceFAZolin (ANCEF) IV     heparin 1,500 Units/hr (01/16/21 0139)   levETIRAcetam Stopped (01/15/21 2020)    amiodarone  200 mg Oral BID   Chlorhexidine Gluconate Cloth  6 each Topical Daily   heparin sodium (porcine)  2,000 Units Intracatheter Once   insulin aspart  0-15 Units Subcutaneous TID WC   rosuvastatin  40 mg Oral QPM   sodium zirconium cyclosilicate  10 g Oral Once   acetaminophen (TYLENOL) oral liquid 160 mg/5 mL, ALPRAZolam, docusate sodium, ondansetron (ZOFRAN) IV, polyethylene glycol  Assessment/ Plan:  Sandra Massey is a 70 y.o.  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  Acute Kidney Injury with hyperkalemia on chronic kidney disease stage 3B with baseline creatinine 1.58 and GFR of 35 on 06/26/20.  Acute kidney injury secondary to sepsis Urinalysis on January 09, 2021: Turbid sample, greater than 50 RBCs, greater than 50 WBCs.  Urine culture negative Renal ultrasound shows Right kidney 11.5 cm, volume 222 mL, diffuse increased echogenicity.  Left kidney 11.1 cm, volume 172 mL, small left renal cyst. Dialysis initiated 01/14/21.  Patient received third dialysis treatment today, tolerated treatment well.  Recommend palliative care consult.  Based on patient's current level of quality  of life, mentation, and other comorbidities, we feel patient will be a poor candidate for long-term dialysis treatments.  Palliative care may help  establish goals of care. Lab Results  Component Value Date   CREATININE 3.67 (H) 01/16/2021   CREATININE 4.62 (H) 01/15/2021   CREATININE 5.35 (H) 01/14/2021    Intake/Output Summary (Last 24 hours) at 01/16/2021 1106 Last data filed at 01/16/2021 0358 Gross per 24 hour  Intake 183.72 ml  Output 502 ml  Net -318.28 ml    2. Sepsis from E. Coli bacteremia Blood cultures positive on 01/10/21 for Enterobacterials, E. Coli Currently on cefazolin with ID following   LOS: 7   11/8/202211:06 AM

## 2021-01-16 NOTE — Consult Note (Signed)
[name] Consult   Reason for Consult: Consult for 70 year old woman who presented with altered mental status and has had a complex hospital course.  Question about current capacity. Referring Physician: Posey Pronto Patient Identification: Sandra Massey MRN:  [id number] Principal Diagnosis: Altered mental status Diagnosis:  Principal Problem:   Altered mental status Active Problems:   ATN (acute tubular necrosis) ([place])   Speech abnormality   Sepsis ([place])   Cerebrovascular accident (CVA) ([place])   Total Time spent with patient: 1 hour  Subjective:   Sandra Massey is a 70 y.o. female patient admitted with patient is unable to give articulate information.  HPI: 70 year old woman who was initially brought to the hospital with acute weakness and mental status change with high concern for a new stroke.  She has a past history of serious stroke with lasting deficits.  Patient was not found to have evidence of a new stroke but had changes in her mentation and behavior compared to baseline.  Complex course with an episode of sepsis with E. coli as well as EEG that suggests the presence of some epileptic activity.  Patient now out of the ICU and transferred to the medical service.  Remains altered from baseline.  Patient was awake but drowsy.  Once I started talking with her she stayed awake but her attention was poor.  She would briefly pay attention and then go back to trying to pull the mitten off of her hand with her teeth.  Patient was able to say "Yep" or "nope" to a few questions but it was not clear that these were meaningful or consistent.  Not able to follow directions.  Unclear if she understood what I was saying to her.  Waxing and waning in and out of being able to pay attention.  Reportedly at baseline the patient is able to have reasonably coherent conversations and share social interactions with family members.  Past Psychiatric History: No past psychiatric history documented but  a past history of stroke with lasting major deficits.  Risk to Self:   Risk to Others:   Prior Inpatient Therapy:   Prior Outpatient Therapy:    Past Medical History:  Past Medical History:  Diagnosis Date   Arthritis    Chronic atrial fibrillation ([place])    CKD (chronic kidney disease), stage III ([place])    Diabetes mellitus without complication ([place])    History of ischemic cerebrovascular accident (CVA) with residual deficit    Hypertension    Substance abuse ([place])     Past Surgical History:  Procedure Laterality Date   BREAST BIOPSY Right    sebaceous cyst removed   Family History: No family history on file. Family Psychiatric  History: None reported Social History:  Social History   Substance and Sexual Activity  Alcohol Use Never     Social History   Substance and Sexual Activity  Drug Use Never    Social History   Socioeconomic History   Marital status: Single    Spouse name: Not on file   Number of children: Not on file   Years of education: Not on file   Highest education level: Not on file  Occupational History   Not on file  Tobacco Use   Smoking status: Never   Smokeless tobacco: Never   Tobacco comments:    2007 quit   Substance and Sexual Activity   Alcohol use: Never   Drug use: Never   Sexual activity: Not on file  Other  Topics Concern   Not on file  Social History Narrative   Not on file   Social Determinants of Health   Financial Resource Strain: Not on file  Food Insecurity: Not on file  Transportation Needs: Not on file  Physical Activity: Not on file  Stress: Not on file  Social Connections: Not on file   Additional Social History:    Allergies:   Allergies  Allergen Reactions   Latex Rash    Labs:  Results for orders placed or performed during the hospital encounter of 01/09/21 (from the past 48 hour(s))  Glucose, capillary     Status: None   Collection Time: 01/14/21  9:21 PM  Result Value Ref Range    Glucose-Capillary 92 70 - 99 mg/dL    Comment: Glucose reference range applies only to samples taken after fasting for at least 8 hours.  Basic metabolic panel     Status: Abnormal   Collection Time: 01/15/21  5:27 AM  Result Value Ref Range   Sodium 139 135 - 145 mmol/L   Potassium 5.8 (H) 3.5 - 5.1 mmol/L   Chloride 105 98 - 111 mmol/L   CO2 16 (L) 22 - 32 mmol/L   Glucose, Bld 99 70 - 99 mg/dL    Comment: Glucose reference range applies only to samples taken after fasting for at least 8 hours.   BUN 57 (H) 8 - 23 mg/dL   Creatinine, Ser 4.62 (H) 0.44 - 1.00 mg/dL   Calcium 6.9 (L) 8.9 - 10.3 mg/dL   GFR, Estimated 10 (L) >60 mL/min    Comment: (NOTE) Calculated using the CKD-EPI Creatinine Equation (2021)    Anion gap 18 (H) 5 - 15    Comment: Performed at [facility], [address] [place], [place] [address]  Hepatitis B surface antigen     Status: None   Collection Time: 01/15/21  8:49 AM  Result Value Ref Range   Hepatitis B Surface Ag NON REACTIVE NON REACTIVE    Comment: Performed at [facility] [address], [place], [place] [address]  Hepatitis B surface antibody     Status: None   Collection Time: 01/15/21  8:49 AM  Result Value Ref Range   Hep B S Ab NON REACTIVE NON REACTIVE    Comment: (NOTE) Inconsistent with immunity, less than 10 mIU/mL.  Performed at [facility], [place] [address] [place], [place] [address]   Hepatitis B surface antibody,quantitative     Status: Abnormal   Collection Time: 01/15/21  8:49 AM  Result Value Ref Range   Hepatitis B-Post <3.1 (L) Immunity>9.9 mIU/mL    Comment: (NOTE)  Status of Immunity                     Anti-HBs Level  ------------------                     -------------- Inconsistent with Immunity                   0.0 - 9.9 Consistent with Immunity                          >9.9 Performed At: [facility] [place], [place] [id number] Rush Farmer MD IH:[id number]    Glucose, capillary     Status: Abnormal   Collection Time: 01/15/21  9:44 AM  Result Value Ref Range  Glucose-Capillary 107 (H) 70 - 99 mg/dL    Comment: Glucose reference range applies only to samples taken after fasting for at least 8 hours.  Glucose, capillary     Status: None   Collection Time: 01/15/21  2:58 PM  Result Value Ref Range   Glucose-Capillary 84 70 - 99 mg/dL    Comment: Glucose reference range applies only to samples taken after fasting for at least 8 hours.  Glucose, capillary     Status: None   Collection Time: 01/15/21  4:29 PM  Result Value Ref Range   Glucose-Capillary 94 70 - 99 mg/dL    Comment: Glucose reference range applies only to samples taken after fasting for at least 8 hours.  Glucose, capillary     Status: Abnormal   Collection Time: 01/15/21  8:36 PM  Result Value Ref Range   Glucose-Capillary 102 (H) 70 - 99 mg/dL    Comment: Glucose reference range applies only to samples taken after fasting for at least 8 hours.  Hepatic function panel     Status: Abnormal   Collection Time: 01/15/21 10:54 PM  Result Value Ref Range   Total Protein 6.0 (L) 6.5 - 8.1 g/dL   Albumin 2.2 (L) 3.5 - 5.0 g/dL   AST 147 (H) 15 - 41 U/L   ALT 147 (H) 0 - 44 U/L   Alkaline Phosphatase 387 (H) 38 - 126 U/L   Total Bilirubin 1.8 (H) 0.3 - 1.2 mg/dL   Bilirubin, Direct 0.1 0.0 - 0.2 mg/dL   Indirect Bilirubin 1.7 (H) 0.3 - 0.9 mg/dL    Comment: Performed at [facility], [address] [place], [place] [address]  Basic metabolic panel     Status: Abnormal   Collection Time: 01/16/21  5:14 AM  Result Value Ref Range   Sodium 139 135 - 145 mmol/L   Potassium 3.6 3.5 - 5.1 mmol/L   Chloride 100 98 - 111 mmol/L   CO2 21 (L) 22 - 32 mmol/L   Glucose, Bld 127 (H) 70 - 99 mg/dL    Comment: Glucose reference range applies only to samples taken after fasting for at least 8 hours.   BUN 37 (H) 8 - 23 mg/dL   Creatinine, Ser 3.67 (H) 0.44 - 1.00 mg/dL   Calcium  6.9 (L) 8.9 - 10.3 mg/dL   GFR, Estimated 13 (L) >60 mL/min    Comment: (NOTE) Calculated using the CKD-EPI Creatinine Equation (2021)    Anion gap 18 (H) 5 - 15    Comment: Performed at [facility], [address] [place], [place] [address]  Glucose, capillary     Status: Abnormal   Collection Time: 01/16/21  7:46 AM  Result Value Ref Range   Glucose-Capillary 147 (H) 70 - 99 mg/dL    Comment: Glucose reference range applies only to samples taken after fasting for at least 8 hours.   Comment 1 Notify RN   Heparin level (unfractionated)     Status: Abnormal   Collection Time: 01/16/21  1:31 PM  Result Value Ref Range   Heparin Unfractionated 0.84 (H) 0.30 - 0.70 IU/mL    Comment: (NOTE) The clinical reportable range upper limit is being lowered to >1.10 to align with the FDA approved guidance for the current laboratory assay.  If heparin results are below expected values, and patient dosage has  been confirmed, suggest follow up testing of antithrombin III levels. Performed at [facility], [place], [place]  [address]   Glucose, capillary     Status: Abnormal   Collection Time: 01/16/21  4:38 PM  Result Value Ref Range   Glucose-Capillary 109 (H) 70 - 99 mg/dL    Comment: Glucose reference range applies only to samples taken after fasting for at least 8 hours.    Current Facility-Administered Medications  Medication Dose Route Frequency Provider Last Rate Last Admin   0.9 %  sodium chloride infusion   Intravenous PRN Fritzi Mandes, MD 13 mL/hr at 01/16/21 1634 1,300 Units/hr at 01/16/21 1634   acetaminophen (TYLENOL) 160 MG/5ML solution 650 mg  650 mg Oral Q6H PRN Sharion Settler, NP   650 mg at 01/13/21 0404   ALPRAZolam Duanne Moron) tablet 0.5 mg  0.5 mg Oral BID PRN Shawna Clamp, MD   0.5 mg at 01/13/21 0158   amiodarone (PACERONE) tablet 200 mg  200 mg Oral BID Enzo Bi, MD   200 mg at 01/12/21 2100   ceFAZolin (ANCEF) IVPB 1 g/50 mL  premix  1 g Intravenous Q24H Zeigler, Dustin G, RPH       Chlorhexidine Gluconate Cloth 2 % PADS 6 each  6 each Topical Daily Shawna Clamp, MD   6 each at 01/16/21 1443   docusate sodium (COLACE) capsule 100 mg  100 mg Oral BID PRN Enzo Bi, MD       heparin ADULT infusion 100 units/mL (25000 units/269mL)  1,300 Units/hr Intravenous Continuous Fritzi Mandes, MD 13 mL/hr at 01/16/21 1634 1,300 Units/hr at 01/16/21 1634   heparin sodium (porcine) injection 2,000 Units  2,000 Units Intracatheter Once Esco, Miechia A, MD       insulin aspart (novoLOG) injection 0-15 Units  0-15 Units Subcutaneous TID WC Enzo Bi, MD   2 Units at 01/14/21 0842   levETIRAcetam (KEPPRA) IVPB 500 mg/100 mL premix  500 mg Intravenous Q12H Shawna Clamp, MD   Stopped at 01/16/21 1435   ondansetron (ZOFRAN) injection 4 mg  4 mg Intravenous Q6H PRN Enzo Bi, MD   4 mg at 01/11/21 2154   polyethylene glycol (MIRALAX / GLYCOLAX) packet 17 g  17 g Oral BID PRN Enzo Bi, MD       rosuvastatin (CRESTOR) tablet 40 mg  40 mg Oral QPM Enzo Bi, MD   40 mg at 01/12/21 1818   sodium zirconium cyclosilicate (LOKELMA) packet 10 g  10 g Oral Once Liana Gerold, MD        Musculoskeletal: Strength & Muscle Tone: decreased Gait & Station: unable to stand Patient leans: N/A            Psychiatric Specialty Exam:  Presentation  General Appearance: No data recorded Eye Contact:No data recorded Speech:No data recorded Speech Volume:No data recorded Handedness:No data recorded  Mood and Affect  Mood:No data recorded Affect:No data recorded  Thought Process  Thought Processes:No data recorded Descriptions of Associations:No data recorded Orientation:No data recorded Thought Content:No data recorded History of Schizophrenia/Schizoaffective disorder:No data recorded Duration of Psychotic Symptoms:No data recorded Hallucinations:No data recorded Ideas of Reference:No data recorded Suicidal Thoughts:No data  recorded Homicidal Thoughts:No data recorded  Sensorium  Memory:No data recorded Judgment:No data recorded Insight:No data recorded  Executive Functions  Concentration:No data recorded Attention Span:No data recorded Recall:No data recorded Fund of Knowledge:No data recorded Language:No data recorded  Psychomotor Activity  Psychomotor Activity:No data recorded  Assets  Assets:No data recorded  Sleep  Sleep:No data recorded  Physical Exam: Physical Exam Constitutional:      Appearance: Normal appearance.  HENT:     Head: Normocephalic and atraumatic.     Mouth/Throat:     Pharynx: Oropharynx is clear.  Eyes:     Pupils: Pupils are equal, round, and reactive to light.  Cardiovascular:     Rate and Rhythm: Normal rate and regular rhythm.  Pulmonary:     Effort: Pulmonary effort is normal.     Breath sounds: Normal breath sounds.  Abdominal:     General: Abdomen is flat.     Palpations: Abdomen is soft.  Musculoskeletal:        General: Normal range of motion.  Skin:    General: Skin is warm and dry.  Neurological:     Mental Status: She is alert.     Motor: Weakness present.     Comments: Right-sided weakness chronic due to stroke.  Patient unable to cooperate with formal exam.  Psychiatric:        Attention and Perception: She is inattentive.        Mood and Affect: Mood normal. Affect is blunt.        Speech: She is noncommunicative.        Behavior: Behavior is slowed.        Cognition and Memory: Cognition is impaired.   Review of Systems  Unable to perform ROS: Mental status change  Blood pressure 135/73, pulse 81, temperature 98.1 F (36.7 C), temperature source Axillary, resp. rate 20, height 5\' 7"  (1.702 m), weight 111.1 kg, SpO2 96 %. Body mass index is 38.37 kg/m.  Treatment Plan Summary: Plan 70 year old woman who currently presents as delirious.  Some of this could be from lingering effects of the sepsis and hospitalization affecting what is  already an abnormal baseline.  Could also be related to the newly discovered seizure activity or medication.  Currently the patient is trying to pull the mitten off of her hand with her teeth but was not able to make much progress.  Otherwise not doing anything to harm herself.  Does not appear to need extra medication added at this point for safety.  As far as capacity the patient was not able to carry on any meaningful conversation and it was not even clear how much she was attending.  In this condition she lacks capacity to be involved in making any decisions.  Obviously this can change and it is possible that the patient will recover and regain capacity at a future time when it will need to be reassessed.  Disposition: No evidence of imminent risk to self or others at present.   Patient does not meet criteria for psychiatric inpatient admission. Supportive therapy provided about ongoing stressors.  Alethia Berthold, MD 01/16/2021 5:50 PM

## 2021-01-16 NOTE — Progress Notes (Signed)
Pt is difficult stick. Two nurses, off going RN and on coming RN assessed patient. Will place IV team consult for second access for Keppra  infusion.

## 2021-01-16 NOTE — Progress Notes (Signed)
Patient tolerated HD session without incident. Met UF goal of 1 liter. Patient attempted to remove PIV from LFA VSS, afebrile, returned to floor.

## 2021-01-16 NOTE — Progress Notes (Signed)
[facility] NAME: Sandra Massey    MR#:  [id number]  [place]:  [birth year]  SUBJECTIVE:   patient has been yelling and screaming. Cannot hold meaningful conversation. Per staff she is been noncooperative with PO diet and PO meds. Her eliquis was changed to IV heparin drip last night. I have not been able to reach family members on the five numbers provided in the chart. Left messages two days in a row.  Not sure what patient's baseline mental status is.  REVIEW OF SYSTEMS:   Review of Systems  Unable to perform ROS: Mental acuity  Tolerating Diet: Tolerating PT:   DRUG ALLERGIES:   Allergies  Allergen Reactions  . Latex Rash    VITALS:  Blood pressure 132/72, pulse 86, temperature (!) 97.3 F (36.3 C), temperature source Axillary, resp. rate 18, height 5\' 7"  (1.702 m), weight 111.1 kg, SpO2 99 %.  PHYSICAL EXAMINATION:   Physical Exam limited exam due to  mental status  GENERAL:  70 y.o.-year-old patient lying in the bed with no acute distress. Confused, yelling, screaming HEENT: Head atraumatic, normocephalic. Oropharynx and nasopharynx clear.  LUNGS: Normal breath sounds bilaterally CARDIOVASCULAR: S1, S2 normal. No murmurs, rubs, or gallops.  EXTREMITIES: No cyanosis, clubbing or edema b/l.    NEUROLOGIC: nonfocal, chronic hemiparesis from old CVA PSYCHIATRIC:  patient is yelling and restless SKIN: per RN  LABORATORY PANEL:  CBC Recent Labs  Lab 01/14/21 0547  WBC 9.7  HGB 9.9*  HCT 31.5*  PLT 184     Chemistries  Recent Labs  Lab 01/14/21 0547 01/15/21 0527 01/15/21 2254 01/16/21 0514  NA 139   < >  --  139  K 5.2*   < >  --  3.6  CL 106   < >  --  100  CO2 14*   < >  --  21*  GLUCOSE 127*   < >  --  127*  BUN 74*   < >  --  37*  CREATININE 5.35*   < >  --  3.67*  CALCIUM 7.2*   < >  --  6.9*  MG 2.5*  --   --   --   AST  --   --  147*  --   ALT  --   --  147*  --   ALKPHOS  --   --   387*  --   BILITOT  --   --  1.8*  --    < > = values in this interval not displayed.    Cardiac Enzymes No results for input(s): TROPONINI in the last 168 hours. RADIOLOGY:  No results found. 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  Head CT did not show any findings, neurology consult was obtained who recommended a stroke work-up EEG showed evidence of epileptiform waves arising in the left posterior temporal region.  Patient was started on Keppra 500 mg twice a day..  Patient became hypotensive requiring IV fluid blood cultures grew E. coli she was started on ceftriaxone IV nephrology was consulted for acute on chronic kidney disease.  Severe sepsis present on admission secondary to E. coli bacteremia source appears here -- seen by infectious  disease continue IV Rocephin -- blood pressure stable -- white count improving  acute metabolic encephalopathy with history of expressive aphasia seizure disorder -- patient has history of left MCA territory infarct with right sided hemiparesis and was noted to have changed her speech with cognitive deficit at her long-term facility -- seen by neurology-- EEG showed evidence of epileptiform waves arising from the left posterior temporal region -- patient on IV Keppra -- seizure precaution  acute on chronic kidney injury stage IIIB-- worsening renal function and poor urine output -- Dr Theador Hawthorne-- started pt on HD 11/6  chronic atrial fibrillation on eliquis -- continue amiodarone  cognitive deficit -- unsure about patient's baseline. Unable to reach any family members 2 days in a row --?? Early dementia --Psych consult for ?competency evaluation -- patient has  been noncompliant with PO diet and PO medication intake.  patient overall care is a poor prognosis  Procedures: temporary dialysis catheter Family communication : unable to reach family today also Consults : ID, nephrology CODE STATUS: DNR DNI DVT Prophylaxis : heparin Level of care: Progressive Cardiac Status is: Inpatient  Remains inpatient appropriate because: multiple medical issues. Patient just got started on dialysis        TOTAL TIME TAKING CARE OF THIS PATIENT: 25 minutes.  >50% time spent on counselling and coordination of care  Note: This dictation was prepared with Dragon dictation along with smaller phrase technology. Any transcriptional errors that result from this process are unintentional.  Fritzi Mandes M.D    [facility]   CC: Primary care physician; [name], MD Patient ID: Sandra Massey, female   DOB: 1950/06/11, 70 y.o.   MRN: [id number]

## 2021-01-16 NOTE — Progress Notes (Signed)
ANTICOAGULATION CONSULT NOTE - Initial Consult  Pharmacy Consult for Heparin  Indication: atrial fibrillation  Allergies  Allergen Reactions   Latex Rash    Patient Measurements: Height: 5\' 7"  (170.2 cm) Weight: 111.1 kg (245 lb) (bedscale) IBW/kg (Calculated) : 61.6 Heparin Dosing Weight: 94.7 kg   Vital Signs: Temp: 98.1 F (36.7 C) (11/08 1500) Temp Source: Axillary (11/08 1500) BP: 135/73 (11/08 1500) Pulse Rate: 81 (11/08 1500)  Labs: Recent Labs    01/14/21 0547 01/15/21 0527 01/16/21 0514 01/16/21 1331  HGB 9.9*  --   --   --   HCT 31.5*  --   --   --   PLT 184  --   --   --   HEPARINUNFRC  --   --   --  0.84*  CREATININE 5.35* 4.62* 3.67*  --      Estimated Creatinine Clearance: 18.9 mL/min (A) (by C-G formula based on SCr of 3.67 mg/dL (H)).   Medical History: Past Medical History:  Diagnosis Date   Arthritis    Chronic atrial fibrillation (H[facility])    CKD (chronic kidney disease), stage III ([facility])    Diabetes mellitus without complication ([place])    History of ischemic cerebrovascular accident (CVA) with residual deficit    Hypertension    Substance abuse ([place])     Medications:  Medications Prior to Admission  Medication Sig Dispense Refill Last Dose   amiodarone (PACERONE) 200 MG tablet Take 1 tablet (200 mg total) by mouth 2 (two) times daily. 60 tablet 0 01/09/2021 at 0900   amLODipine (NORVASC) 10 MG tablet Take 1 tablet (10 mg total) by mouth daily. 30 tablet 11 01/09/2021 at 0900   cetirizine (ZYRTEC) 5 MG tablet Take 5 mg by mouth daily.   01/09/2021 at 0900   ELIQUIS 5 MG TABS tablet Take 5 mg by mouth 2 (two) times daily.   01/09/2021 at 0900   LEVEMIR FLEXTOUCH 100 UNIT/ML FlexTouch Pen Inject 20 Units into the skin daily at 12 noon.   01/09/2021 at 0900   metFORMIN (GLUCOPHAGE) 1000 MG tablet Take 1,000 mg by mouth 2 (two) times daily.   01/09/2021 at 0900   Multiple Vitamin (MULTIVITAMIN WITH MINERALS) TABS tablet Take 1 tablet by mouth daily.    01/09/2021 at 0900   NOVOLIN R 100 UNIT/ML injection Inject 2-10 Units into the skin 4 (four) times daily -  before meals and at bedtime.   01/09/2021 at 1130   rosuvastatin (CRESTOR) 40 MG tablet Take 40 mg by mouth every evening.   01/08/2021 at 1800   ascorbic acid (VITAMIN C) 500 MG tablet Take 500 mg by mouth daily. (Patient not taking: Reported on 01/09/2021)   Not Taking   insulin aspart (NOVOLOG) 100 UNIT/ML FlexPen Inject 20 Units into the skin 3 (three) times daily with meals. (Patient not taking: Reported on 01/09/2021)   Not Taking   insulin glargine (LANTUS) 100 UNIT/ML Solostar Pen Inject 32 Units into the skin daily. (Patient not taking: No sig reported) 10 mL  Not Taking   Insulin Pen Needle 31G X 8 MM MISC 1 needle with each injection of insulin 100 each 0    sertraline (ZOLOFT) 50 MG tablet Take 1 tablet (50 mg total) by mouth daily. (Patient not taking: No sig reported) 5 tablet 0 Not Taking    Assessment: Pharmacy consulted to dose heparin for Afib in this 70 year old female.  Pt was on Eliquis PTA but is refusing all oral medication  including Eliquis. Noted hx of Afib and CVA. Last dose was on 11/1 @ 0900. Currently on HD  Goal of Therapy:  Heparin level 0.3-0.7 units/ml Monitor platelets by anticoagulation protocol: Yes   Date/time HL  Comment 1118 @1331  0.84  Supra-therapeutic   Plan:   Heparin level supra-therapeutic at current rate of 1500 units/hr. Will decrease to 1300 units/hr Check heparin level in 8 hours Continue to monitor H&H and platelets  Deion Forgue Rodriguez-Guzman PharmD, BCPS 01/16/2021 3:17 PM

## 2021-01-16 NOTE — Progress Notes (Signed)
Foley removed without complication, no urine output at this time.

## 2021-01-17 DIAGNOSIS — N170 Acute kidney failure with tubular necrosis: Secondary | ICD-10-CM | POA: Diagnosis not present

## 2021-01-17 DIAGNOSIS — N390 Urinary tract infection, site not specified: Secondary | ICD-10-CM

## 2021-01-17 DIAGNOSIS — Z7189 Other specified counseling: Secondary | ICD-10-CM

## 2021-01-17 DIAGNOSIS — A021 Salmonella sepsis: Secondary | ICD-10-CM | POA: Diagnosis not present

## 2021-01-17 DIAGNOSIS — A4150 Gram-negative sepsis, unspecified: Secondary | ICD-10-CM

## 2021-01-17 LAB — GLUCOSE, CAPILLARY
Glucose-Capillary: 139 mg/dL — ABNORMAL HIGH (ref 70–99)
Glucose-Capillary: 181 mg/dL — ABNORMAL HIGH (ref 70–99)
Glucose-Capillary: 199 mg/dL — ABNORMAL HIGH (ref 70–99)
Glucose-Capillary: 275 mg/dL — ABNORMAL HIGH (ref 70–99)

## 2021-01-17 LAB — BASIC METABOLIC PANEL
Anion gap: 11 (ref 5–15)
BUN: 30 mg/dL — ABNORMAL HIGH (ref 8–23)
CO2: 26 mmol/L (ref 22–32)
Calcium: 8.1 mg/dL — ABNORMAL LOW (ref 8.9–10.3)
Chloride: 99 mmol/L (ref 98–111)
Creatinine, Ser: 3.68 mg/dL — ABNORMAL HIGH (ref 0.44–1.00)
GFR, Estimated: 13 mL/min — ABNORMAL LOW (ref >60)
Glucose, Bld: 220 mg/dL — ABNORMAL HIGH (ref 70–99)
Potassium: 3.5 mmol/L (ref 3.5–5.1)
Sodium: 136 mmol/L (ref 135–145)

## 2021-01-17 LAB — CBC
HCT: 29.9 % — ABNORMAL LOW (ref 36.0–46.0)
Hemoglobin: 9.7 g/dL — ABNORMAL LOW (ref 12.0–15.0)
MCH: 27.5 pg (ref 26.0–34.0)
MCHC: 32.4 g/dL (ref 30.0–36.0)
MCV: 84.7 fL (ref 80.0–100.0)
Platelets: 237 10*3/uL (ref 150–400)
RBC: 3.53 MIL/uL — ABNORMAL LOW (ref 3.87–5.11)
RDW: 18.3 % — ABNORMAL HIGH (ref 11.5–15.5)
WBC: 8.8 10*3/uL (ref 4.0–10.5)
nRBC: 0 % (ref 0.0–0.2)

## 2021-01-17 LAB — HEPARIN LEVEL (UNFRACTIONATED): Heparin Unfractionated: 0.55 IU/mL (ref 0.30–0.70)

## 2021-01-17 MED ORDER — ENSURE MAX PROTEIN PO LIQD
11.0000 [oz_av] | Freq: Every day | ORAL | Status: DC
  Administered 2021-01-17 – 2021-01-18 (×2): 11 [oz_av] via ORAL
  Filled 2021-01-17: qty 330

## 2021-01-17 MED ORDER — APIXABAN 5 MG PO TABS
5.0000 mg | ORAL_TABLET | Freq: Two times a day (BID) | ORAL | Status: DC
  Administered 2021-01-17 – 2021-01-22 (×12): 5 mg via ORAL
  Filled 2021-01-17 (×12): qty 1

## 2021-01-17 MED ORDER — ADULT MULTIVITAMIN W/MINERALS CH
1.0000 | ORAL_TABLET | Freq: Every day | ORAL | Status: DC
  Administered 2021-01-17 – 2021-01-18 (×2): 1 via ORAL
  Filled 2021-01-17 (×2): qty 1

## 2021-01-17 MED ORDER — GLUCERNA SHAKE PO LIQD
237.0000 mL | Freq: Three times a day (TID) | ORAL | Status: DC
  Administered 2021-01-17 – 2021-01-18 (×3): 237 mL via ORAL

## 2021-01-17 NOTE — Progress Notes (Signed)
Palliative-  Chart reviewed.  I evaluated the patient.  She was unable to participate in a meaningful discussion regarding goals of care.  Per chart review she is a resident at [facility] and lives with her sister- [name].   I attempted to call all numbers listed for sister Jenaye Rickert and [name].   Left a message for Alayzia Pavlock requesting a return call. No answer and no VM for [name].   Will await return call.   Mariana Kaufman, AGNP-C Palliative Medicine  No charge

## 2021-01-17 NOTE — Progress Notes (Signed)
ANTICOAGULATION CONSULT NOTE - Initial Consult  Pharmacy Consult for Heparin  Indication: atrial fibrillation  Allergies  Allergen Reactions   Latex Rash    Patient Measurements: Height: 5\' 7"  (170.2 cm) Weight: 111.1 kg (245 lb) (bedscale) IBW/kg (Calculated) : 61.6 Heparin Dosing Weight: 94.7 kg   Vital Signs: Temp: 97.6 F (36.4 C) (11/09 0053) Temp Source: Oral (11/09 0053) BP: 125/71 (11/09 0053) Pulse Rate: 65 (11/08 2011)  Labs: Recent Labs    01/14/21 0547 01/15/21 0527 01/16/21 0514 01/16/21 1331 01/16/21 2250  HGB 9.9*  --   --   --   --   HCT 31.5*  --   --   --   --   PLT 184  --   --   --   --   HEPARINUNFRC  --   --   --  0.84* 0.57  CREATININE 5.35* 4.62* 3.67*  --   --      Estimated Creatinine Clearance: 18.9 mL/min (A) (by C-G formula based on SCr of 3.67 mg/dL (H)).   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 60 tablet 0 01/09/2021 at 0900   amLODipine (NORVASC) 10 MG tablet Take 1 tablet (10 mg total) by mouth daily. 30 tablet 11 01/09/2021 at 0900   cetirizine (ZYRTEC) 5 MG tablet Take 5 mg by mouth daily.   01/09/2021 at 0900   ELIQUIS 5 MG TABS tablet Take 5 mg by mouth 2 (two) times daily.   01/09/2021 at 0900   LEVEMIR FLEXTOUCH 100 UNIT/ML FlexTouch Pen Inject 20 Units into the skin daily at 12 noon.   01/09/2021 at 0900   metFORMIN (GLUCOPHAGE) 1000 MG tablet Take 1,000 mg by mouth 2 (two) times daily.   01/09/2021 at 0900   Multiple Vitamin (MULTIVITAMIN WITH MINERALS) TABS  tablet Take 1 tablet by mouth daily.   01/09/2021 at 0900   NOVOLIN R 100 UNIT/ML injection Inject 2-10 Units into the skin 4 (four) times daily -  before meals and at bedtime.   01/09/2021 at 1130   rosuvastatin (CRESTOR) 40 MG tablet Take 40 mg by mouth every evening.   01/08/2021 at 1800   ascorbic acid (VITAMIN C) 500 MG tablet Take 500 mg by mouth daily. (Patient not taking: Reported on 01/09/2021)   Not Taking   insulin aspart (NOVOLOG) 100 UNIT/ML FlexPen Inject 20 Units into the skin 3 (three) times daily with meals. (Patient not taking: Reported on 01/09/2021)   Not Taking   insulin glargine (LANTUS) 100 UNIT/ML Solostar Pen Inject 32 Units into the skin daily. (Patient not taking: No sig reported) 10 mL  Not Taking   Insulin Pen Needle 31G X 8 MM MISC 1 needle with each injection of insulin 100 each 0    sertraline (ZOLOFT) 50 MG tablet Take 1 tablet (50 mg total) by mouth daily. (Patient not taking: No sig reported) 5 tablet 0 Not Taking    Assessment: Pharmacy consulted to dose heparin for Afib in this 70  year old female.  Pt was on Eliquis PTA but is refusing all oral medication including Eliquis. Noted hx of Afib and CVA. Last dose was on 11/1 @ 0900. Currently on HD  Goal of Therapy:  Heparin level 0.3-0.7 units/ml Monitor platelets by anticoagulation protocol: Yes   Date/time HL  Comment 1108 @1331  0.84  Supra-therapeutic  1108 @2250    0.57                Therapeutic X 1   Plan:  11/8:  HL @ 2250 = 0.57, therapeutic X 1  Will continue pt on current rate and draw confirmation level on 11/8 @ 0700.   Maryem Shuffler D 01/17/2021 1:01 AM

## 2021-01-17 NOTE — Progress Notes (Signed)
PROGRESS NOTE  Sandra Massey    DOB: Mar 18, 1950, 70 y.o.  ZCH:[id number]  PCP: [name], MD   Code Status: DNR   DOA: 01/09/2021   LOS: 8  Brief Narrative of Current Hospitalization  Sandra Massey is a 70 y.o. female with a PMH significant for Jehovah's Witness, CVA with residual right-sided hemiparesis, chronic A. fib, type II DM, HTN. They presented from SNF to the ED on 01/09/2021 with acute AMS. In the ED, it was found that they had no acute changes on head CT but abnormal electrical activity on EEG.  Neurology was consulted. They were treated with Keppra.  Additionally, patient was septic with blood cultures positive for E. coli.  She was started on IV ceftriaxone and nephrology was consulted. Patient was admitted to medicine service for further workup and management of sepsis with acute encephalopathy as outlined in detail below.  01/17/21 -stable, improved  Assessment & Plan  Principal Problem:   Acute delirium Active Problems:   ATN (acute tubular necrosis) (HCC)   Speech abnormality   Sepsis ([place])   Cerebrovascular accident (CVA) ([place])   Altered mental status  Sepsis secondary to E. coli bacteremia with acute encephalopathy-Per chart review, patient appears to be more alert and verbal today but unsure of this comparison to baseline.  Blood cultures positive for E. coli which was sensitive to cephalosporins. -Ancef (11/7-11/11) - CTX (11/3-11/6) - cefepime (11/2) - flagyl 11/2 - vancomycin 11/2 - PT/OT  AKI on CKDIIIb-temporary dialysis catheter placed and received HD starting 11/6.  Creatinine gradually improving.  4.62> 3.67 today. Baseline around 1.58 -Nephrology following, appreciate recommendations  A. Fib  HTN-CHA2DS2-VASc score= 6.  Rate controlled. -Continue home Eliquis -Continue home amiodarone 200 mg twice daily  Type II DM- hgb A1c on admission 6.9. blood sugars relatively well controlled this admission. Not eating much - continue mSSI  Seizure  prophylaxis- no active seizure activity or repetitive motions observed -Neurology following, appreciate recommendations - continue keppra  H/o CVA  HLD-residual right-sided hemiparesis and dysphagia -Continue home rosuvastatin - psychiatry consulted-   DVT prophylaxis:   Heparin apixaban (ELIQUIS) tablet 5 mg   Diet:  Diet Orders (From admission, onward)     Start     Ordered   01/17/21 1025  Diet Carb Modified Fluid consistency: Thin; Room service appropriate? No  Diet effective now       Question Answer Comment  Diet-HS Snack? Nothing   Calorie Level Medium 1600-2000   Fluid consistency: Thin   Room service appropriate? No      01/17/21 1024            Subjective 01/17/21    Pt reports no concerns or pain today.  On initial evaluation, patient appears to be answering questions appropriately with yes and no answers.  However, when probing for further clarification, patient is unable to expand on her conversation.  Disposition Plan & Communication  Patient status: Inpatient  Admitted From: SNF Disposition: Skilled nursing facility Anticipated discharge date: TBD, pending return to baseline and completion of antibiotics  Family Communication: none at bedside  Consults, Procedures, Significant Events  Consultants:  ID Neurology nephrology  Procedures/significant events:  Temp cath, started HD  Antimicrobials:  Anti-infectives (From admission, onward)    Start     Dose/Rate Route Frequency Ordered Stop   01/16/21 1800  ceFAZolin (ANCEF) IVPB 1 g/50 mL premix        1 g 100 mL/hr over 30 Minutes Intravenous Every 24 hours  01/15/21 1418 01/18/21 2359   01/15/21 1800  ceFAZolin (ANCEF) IVPB 2g/100 mL premix        2 g 200 mL/hr over 30 Minutes Intravenous  Once 01/15/21 1417 01/15/21 1822   01/15/21 0900  ceFAZolin (ANCEF) IVPB 1 g/50 mL premix  Status:  Discontinued        1 g 100 mL/hr over 30 Minutes Intravenous Every 24 hours 01/14/21 1502 01/15/21 1417    01/11/21 1300  vancomycin (VANCOREADY) IVPB 750 mg/150 mL  Status:  Discontinued        750 mg 150 mL/hr over 60 Minutes Intravenous Every 24 hours 01/10/21 0235 01/10/21 1116   01/11/21 0800  vancomycin (VANCOREADY) IVPB 1500 mg/300 mL  Status:  Discontinued        1,500 mg 150 mL/hr over 120 Minutes Intravenous Every 48 hours 01/10/21 1116 01/10/21 1448   01/11/21 0800  cefTRIAXone (ROCEPHIN) 2 g in sodium chloride 0.9 % 100 mL IVPB  Status:  Discontinued        2 g 200 mL/hr over 30 Minutes Intravenous Every 24 hours 01/10/21 1448 01/14/21 1502   01/10/21 1200  ceFEPIme (MAXIPIME) 2 g in sodium chloride 0.9 % 100 mL IVPB  Status:  Discontinued        2 g 200 mL/hr over 30 Minutes Intravenous Every 12 hours 01/10/21 0219 01/10/21 1448   01/09/21 2245  metroNIDAZOLE (FLAGYL) IVPB 500 mg  Status:  Discontinued        500 mg 100 mL/hr over 60 Minutes Intravenous 2 times daily 01/09/21 2228 01/10/21 1448   01/09/21 2245  vancomycin (VANCOREADY) IVPB 2000 mg/400 mL        2,000 mg 200 mL/hr over 120 Minutes Intravenous  Once 01/09/21 2234 01/10/21 0708   01/09/21 2230  ceFEPIme (MAXIPIME) 2 g in sodium chloride 0.9 % 100 mL IVPB        2 g 200 mL/hr over 30 Minutes Intravenous  Once 01/09/21 2228 01/10/21 0204   01/09/21 2230  vancomycin (VANCOCIN) IVPB 1000 mg/200 mL premix  Status:  Discontinued        1,000 mg 200 mL/hr over 60 Minutes Intravenous  Once 01/09/21 2228 01/09/21 2234       Objective   Vitals:   01/17/21 0209 01/17/21 0420 01/17/21 0820 01/17/21 1247  BP: 136/65 122/83 (!) 115/58 100/68  Pulse: 72 70 (!) 53 72  Resp: 20 16 17 17   Temp: 98.6 F (37 C) (!) 97.5 F (36.4 C) (!) 97.5 F (36.4 C) 97.8 F (36.6 C)  TempSrc:  Oral    SpO2: 96% 94% 91% 91%  Weight:      Height:        Intake/Output Summary (Last 24 hours) at 01/17/2021 1447 Last data filed at 01/17/2021 1056 Gross per 24 hour  Intake 1049.49 ml  Output --  Net 1049.49 ml   Filed Weights    01/09/21 1352 01/16/21 0900  Weight: 136.1 kg 111.1 kg    Patient BMI: Body mass index is 38.37 kg/m.   Physical Exam: General: awake, alert, NAD HEENT: atraumatic, clear conjunctiva, anicteric sclera, moist mucus membranes, hearing grossly normal Respiratory: normal respiratory effort. Cardiovascular: normal S1/S2,  RRR, no JVD, murmurs, rubs, gallops, quick capillary refill  Gastrointestinal: soft, NT, ND, no HSM felt Nervous: Alert, unable to assess orientation. Unable to follow commands Extremities: non-pitting LE edema, normal tone Skin: dry, intact, normal temperature, normal color, No rashes, lesions or ulcers on exposed skin  Labs  I have personally reviewed following labs and imaging studies BMP Latest Ref Rng & Units 01/16/2021 01/15/2021 01/14/2021  Glucose 70 - 99 mg/dL 127(H) 99 127(H)  BUN 8 - 23 mg/dL 37(H) 57(H) 74(H)  Creatinine 0.44 - 1.00 mg/dL 3.67(H) 4.62(H) 5.35(H)  Sodium 135 - 145 mmol/L 139 139 139  Potassium 3.5 - 5.1 mmol/L 3.6 5.8(H) 5.2(H)  Chloride 98 - 111 mmol/L 100 105 106  CO2 22 - 32 mmol/L 21(L) 16(L) 14(L)  Calcium 8.9 - 10.3 mg/dL 6.9(L) 6.9(L) 7.2(L)   CBC    Component Value Date/Time   WBC 9.7 01/14/2021 0547   RBC 3.59 (L) 01/14/2021 0547   HGB 9.9 (L) 01/14/2021 0547   HCT 31.5 (L) 01/14/2021 0547   PLT 184 01/14/2021 0547   MCV 87.7 01/14/2021 0547   MCH 27.6 01/14/2021 0547   MCHC 31.4 01/14/2021 0547   RDW 18.6 (H) 01/14/2021 0547   LYMPHSABS 1.3 01/10/2021 0009   MONOABS 1.2 (H) 01/10/2021 0009   EOSABS 0.0 01/10/2021 0009   BASOSABS 0.1 01/10/2021 0009   Imaging Studies  No results found. Medications   Scheduled Meds:  amiodarone  200 mg Oral BID   apixaban  5 mg Oral BID   Chlorhexidine Gluconate Cloth  6 each Topical Daily   feeding supplement (GLUCERNA SHAKE)  237 mL Oral TID BM   heparin sodium (porcine)  2,000 Units Intracatheter Once   insulin aspart  0-15 Units Subcutaneous TID WC   multivitamin with minerals   1 tablet Oral Daily   Ensure Max Protein  11 oz Oral Daily   rosuvastatin  40 mg Oral QPM   sodium zirconium cyclosilicate  10 g Oral Once   No recently discontinued medications to reconcile  LOS: 8 days   Time spent: >25min  Kylyn Sookram L Atira Borello, DO [facility] 01/17/2021, 2:47 PM   Please refer to amion to contact the [facility] Attending or Consulting provider for this pt  [email or web address] Available by Epic secure chat 7AM-7PM. If 7PM-7AM, please contact night-coverage

## 2021-01-17 NOTE — Progress Notes (Signed)
ANTICOAGULATION CONSULT NOTE - Initial Consult  Pharmacy Consult for Heparin  Indication: atrial fibrillation  Allergies  Allergen Reactions   Latex Rash    Patient Measurements: Height: 5\' 7"  (170.2 cm) Weight: 111.1 kg (245 lb) (bedscale) IBW/kg (Calculated) : 61.6 Heparin Dosing Weight: 94.7 kg   Vital Signs: Temp: 97.5 F (36.4 C) (11/09 0420) Temp Source: Oral (11/09 0420) BP: 122/83 (11/09 0420) Pulse Rate: 70 (11/09 0420)  Labs: Recent Labs    01/14/21 0547 01/15/21 0527 01/16/21 0514 01/16/21 1331 01/16/21 2250 01/17/21 0433  HGB 9.9*  --   --   --   --   --   HCT 31.5*  --   --   --   --   --   PLT 184  --   --   --   --   --   HEPARINUNFRC  --   --   --  0.84* 0.57 0.55  CREATININE 5.35* 4.62* 3.67*  --   --   --      Estimated Creatinine Clearance: 18.9 mL/min (A) (by C-G formula based on SCr of 3.67 mg/dL (H)).   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 60 tablet 0 01/09/2021 at 0900   amLODipine (NORVASC) 10 MG tablet Take 1 tablet (10 mg total) by mouth daily. 30 tablet 11 01/09/2021 at 0900   cetirizine (ZYRTEC) 5 MG tablet Take 5 mg by mouth daily.   01/09/2021 at 0900   ELIQUIS 5 MG TABS tablet Take 5 mg by mouth 2 (two) times daily.   01/09/2021 at 0900   LEVEMIR FLEXTOUCH 100 UNIT/ML FlexTouch Pen Inject 20 Units into the skin daily at 12 noon.   01/09/2021 at 0900   metFORMIN (GLUCOPHAGE) 1000 MG tablet Take 1,000 mg by mouth 2 (two) times daily.   01/09/2021 at 0900   Multiple  Vitamin (MULTIVITAMIN WITH MINERALS) TABS tablet Take 1 tablet by mouth daily.   01/09/2021 at 0900   NOVOLIN R 100 UNIT/ML injection Inject 2-10 Units into the skin 4 (four) times daily -  before meals and at bedtime.   01/09/2021 at 1130   rosuvastatin (CRESTOR) 40 MG tablet Take 40 mg by mouth every evening.   01/08/2021 at 1800   ascorbic acid (VITAMIN C) 500 MG tablet Take 500 mg by mouth daily. (Patient not taking: Reported on 01/09/2021)   Not Taking   insulin aspart (NOVOLOG) 100 UNIT/ML FlexPen Inject 20 Units into the skin 3 (three) times daily with meals. (Patient not taking: Reported on 01/09/2021)   Not Taking   insulin glargine (LANTUS) 100 UNIT/ML Solostar Pen Inject 32 Units into the skin daily. (Patient not taking: No sig reported) 10 mL  Not Taking   Insulin Pen Needle 31G X 8 MM MISC 1 needle with each injection of insulin 100 each 0    sertraline (ZOLOFT) 50 MG tablet Take 1 tablet (50 mg total) by mouth daily. (Patient not taking: No sig reported) 5 tablet 0 Not  Taking    Assessment: Pharmacy consulted to dose heparin for Afib in this 70 year old female.  Pt was on Eliquis PTA but is refusing all oral medication including Eliquis. Noted hx of Afib and CVA. Last dose was on 11/1 @ 0900. Currently on HD  Goal of Therapy:  Heparin level 0.3-0.7 units/ml Monitor platelets by anticoagulation protocol: Yes   Date/time HL  Comment 1108 @1331  0.84  Supra-therapeutic  1108 @2250    0.57                Therapeutic X 1  11/09@0433    0.55                Therapeutic X 2  Plan:  11/8:  HL @ 2250 = 0.57, therapeutic X 1  Will continue pt on current rate and draw confirmation level on 11/8 @ 0700.   11/9:  HL @ 0433 = 0.55, therapeutic X 2 - lab drew sample 2 1/2 hrs early but correlates with previous HL so will use this. Will continue pt on current rate and recheck HL on 11/10 with AM labs.    Brycin Kille D 01/17/2021 5:27 AM

## 2021-01-17 NOTE — Progress Notes (Signed)
[facility]  ROUNDING NOTE   Subjective:   Patient seen sitting up in bed Feeding herself breakfast More alert and answering questions appropriately Denies pain and discomfort  Creatinine improved  Objective:  Vital signs in last 24 hours:  Temp:  [97.5 F (36.4 C)-98.6 F (37 C)] 97.7 F (36.5 C) (11/09 1553) Pulse Rate:  [53-72] 70 (11/09 1553) Resp:  [16-20] 17 (11/09 1553) BP: (100-139)/(58-97) 116/67 (11/09 1553) SpO2:  [91 %-96 %] 92 % (11/09 1553)  Weight change:  Filed Weights   01/09/21 1352 01/16/21 0900  Weight: 136.1 kg 111.1 kg    Intake/Output: I/O last 3 completed shifts: In: 993.2 [P.O.:150; I.V.:493.2; IV Piggyback:350] Out: 500 [Other:500]   Intake/Output this shift:  Total I/O In: 536.7 [P.O.:360; I.V.:76.7; IV Piggyback:100] Out: 0   Physical Exam: General: NAD  Head: Normocephalic, atraumatic. Moist oral mucosal membranes  Eyes: Anicteric  Lungs:  Clear to auscultation, normal effort  Heart: No rub, irregular rhythm  Abdomen:  Soft, nontender  Extremities:  no peripheral edema.  Neurologic: Aphasic, alert,following simple commands  Skin: No lesions  Access Rt femoral trialysis dialysis catheter    Basic Metabolic Panel: Recent Labs  Lab 01/11/21 0606 01/12/21 0541 01/12/21 1200 01/13/21 0607 01/14/21 0547 01/15/21 0527 01/16/21 0514 01/17/21 1456  NA 138 140   < > 139 139 139 139 136  K 4.6 4.8   < > 5.0 5.2* 5.8* 3.6 3.5  CL 108 107   < > 108 106 105 100 99  CO2 20* 21*   < > 16* 14* 16* 21* 26  GLUCOSE 103* 118*   < > 145* 127* 99 127* 220*  BUN 56* 63*   < > 72* 74* 57* 37* 30*  CREATININE 3.29* 4.14*   < > 4.86* 5.35* 4.62* 3.67* 3.68*  CALCIUM 7.5* 7.6*   < > 7.3* 7.2* 6.9* 6.9* 8.1*  MG 1.5* 2.1  --  2.2 2.5*  --   --   --   PHOS  --   --   --  5.8*  --   --   --   --    < > = values in this interval not displayed.     Liver Function Tests: Recent Labs  Lab 01/11/21 0606 01/12/21 0541  01/12/21 1200 01/15/21 2254  AST 244* 149* 132* 147*  ALT 215* 178* 164* 147*  ALKPHOS 90 137* 140* 387*  BILITOT 0.9 1.1 0.9 1.8*  PROT 5.2* 6.0* 5.6* 6.0*  ALBUMIN 2.2* 2.4* 2.4* 2.2*    No results for input(s): LIPASE, AMYLASE in the last 168 hours. No results for input(s): AMMONIA in the last 168 hours.  CBC: Recent Labs  Lab 01/11/21 0606 01/12/21 0541 01/13/21 0607 01/14/21 0547 01/17/21 1456  WBC 17.0* 16.3* 12.7* 9.7 8.8  HGB 10.1* 10.3* 10.0* 9.9* 9.7*  HCT 33.2* 33.1* 31.4* 31.5* 29.9*  MCV 92.2 91.4 90.2 87.7 84.7  PLT 127* 161 154 184 237     Cardiac Enzymes: Recent Labs  Lab 01/13/21 0607  CKTOTAL 252*     BNP: Invalid input(s): POCBNP  CBG: Recent Labs  Lab 01/16/21 1638 01/16/21 2012 01/17/21 0819 01/17/21 1139 01/17/21 1622  GLUCAP 109* 138* 139* 181* 199*     Microbiology: Results for orders placed or performed during the hospital encounter of 01/09/21  Resp Panel by RT-PCR (Flu A&B, Covid) Nasopharyngeal Swab     Status: None   Collection Time: 01/09/21  2:17 PM   Specimen:  Nasopharyngeal Swab; Nasopharyngeal(NP) swabs in vial transport medium  Result Value Ref Range Status   SARS Coronavirus 2 by RT PCR NEGATIVE NEGATIVE Final    Comment: (NOTE) SARS-CoV-2 target nucleic acids are NOT DETECTED.  The SARS-CoV-2 RNA is generally detectable in upper respiratory specimens during the acute phase of infection. The lowest concentration of SARS-CoV-2 viral copies this assay can detect is 138 copies/mL. A negative result does not preclude SARS-Cov-2 infection and should not be used as the sole basis for treatment or other patient management decisions. A negative result may occur with  improper specimen collection/handling, submission of specimen other than nasopharyngeal swab, presence of viral mutation(s) within the areas targeted by this assay, and inadequate number of viral copies(<138 copies/mL). A negative result must be combined  with clinical observations, patient history, and epidemiological information. The expected result is Negative.  Fact Sheet for Patients:  EntrepreneurPulse.com.au  Fact Sheet for Healthcare Providers:  IncredibleEmployment.be  This test is no t yet approved or cleared by the [place] FDA and  has been authorized for detection and/or diagnosis of SARS-CoV-2 by FDA under an Emergency Use Authorization (EUA). This EUA will remain  in effect (meaning this test can be used) for the duration of the COVID-19 declaration under Section 564(b)(1) of the Act, 21 U.S.C.section 360bbb-3(b)(1), unless the authorization is terminated  or revoked sooner.       Influenza A by PCR NEGATIVE NEGATIVE Final   Influenza B by PCR NEGATIVE NEGATIVE Final    Comment: (NOTE) The Xpert Xpress SARS-CoV-2/FLU/RSV plus assay is intended as an aid in the diagnosis of influenza from Nasopharyngeal swab specimens and should not be used as a sole basis for treatment. Nasal washings and aspirates are unacceptable for Xpert Xpress SARS-CoV-2/FLU/RSV testing.  Fact Sheet for Patients: EntrepreneurPulse.com.au  Fact Sheet for Healthcare Providers: IncredibleEmployment.be  This test is not yet approved or cleared by the [place] FDA and has been authorized for detection and/or diagnosis of SARS-CoV-2 by FDA under an Emergency Use Authorization (EUA). This EUA will remain in effect (meaning this test can be used) for the duration of the COVID-19 declaration under Section 564(b)(1) of the Act, 21 U.S.C. section 360bbb-3(b)(1), unless the authorization is terminated or revoked.  Performed at [facility], [address] [place], [place] [address]   Culture, blood (Routine X 2) w Reflex to ID Panel     Status: Abnormal   Collection Time: 01/10/21 12:09 AM   Specimen: BLOOD  Result Value Ref Range Status   Specimen  Description   Final    BLOOD RIGHT FOREARM Performed at [facility], [address], [place], [place] [address]    Special Requests   Final    BOTTLES DRAWN AEROBIC AND ANAEROBIC Blood Culture results may not be optimal due to an excessive volume of blood received in culture bottles Performed at [facility], [address], [place], [place] [address]    Culture  Setup Time   Final    GRAM NEGATIVE RODS IN BOTH AEROBIC AND ANAEROBIC BOTTLES CRITICAL RESULT CALLED TO, READ BACK BY AND VERIFIED WITH: PHARMD S HALLHAI [id number] AT 815 AM BY CM    Culture (A)  Final    ESCHERICHIA COLI SUSCEPTIBILITIES PERFORMED ON PREVIOUS CULTURE WITHIN THE LAST 5 DAYS. Performed at [facility], [place] [address], [place],  [address]    Report Status 01/13/2021 FINAL  Final  Culture, blood (Routine X 2) w Reflex to ID Panel  Status: Abnormal   Collection Time: 01/10/21 12:09 AM   Specimen: BLOOD  Result Value Ref Range Status   Specimen Description   Final    BLOOD LEFT ANTECUBITAL Performed at [facility], [place] [address] [place], [place] [address]    Special Requests   Final    BOTTLES DRAWN AEROBIC AND ANAEROBIC Blood Culture adequate volume Performed at [address] [place], [place] [address]    Culture  Setup Time   Final    GRAM NEGATIVE RODS IN BOTH AEROBIC AND ANAEROBIC BOTTLES CRITICAL RESULT CALLED TO, READ BACK BY AND VERIFIED WITH: RAQUEL GOODMAN, PHARMD AT 1350 ON 01/10/21 BY GM Performed at [facility], [place] [address], [place], [place] [address]    Culture ESCHERICHIA COLI (A)  Final   Report Status 01/13/2021 FINAL  Final   Organism ID, Bacteria ESCHERICHIA COLI  Final      Susceptibility   Escherichia coli - MIC*    AMPICILLIN >=32 RESISTANT Resistant     CEFAZOLIN <=4 SENSITIVE Sensitive     CEFEPIME <=0.12 SENSITIVE Sensitive     CEFTAZIDIME <=1 SENSITIVE Sensitive     CEFTRIAXONE <=0.25 SENSITIVE  Sensitive     CIPROFLOXACIN <=0.25 SENSITIVE Sensitive     GENTAMICIN 8 INTERMEDIATE Intermediate     IMIPENEM <=0.25 SENSITIVE Sensitive     TRIMETH/SULFA >=320 RESISTANT Resistant     AMPICILLIN/SULBACTAM >=32 RESISTANT Resistant     PIP/TAZO <=4 SENSITIVE Sensitive     * ESCHERICHIA COLI  Blood Culture ID Panel (Reflexed)     Status: Abnormal   Collection Time: 01/10/21 12:09 AM  Result Value Ref Range Status   Enterococcus faecalis NOT DETECTED NOT DETECTED Final   Enterococcus Faecium NOT DETECTED NOT DETECTED Final   Listeria monocytogenes NOT DETECTED NOT DETECTED Final   Staphylococcus species NOT DETECTED NOT DETECTED Final   Staphylococcus aureus (BCID) NOT DETECTED NOT DETECTED Final   Staphylococcus epidermidis NOT DETECTED NOT DETECTED Final   Staphylococcus lugdunensis NOT DETECTED NOT DETECTED Final   Streptococcus species NOT DETECTED NOT DETECTED Final   Streptococcus agalactiae NOT DETECTED NOT DETECTED Final   Streptococcus pneumoniae NOT DETECTED NOT DETECTED Final   Streptococcus pyogenes NOT DETECTED NOT DETECTED Final   A.calcoaceticus-baumannii NOT DETECTED NOT DETECTED Final   Bacteroides fragilis NOT DETECTED NOT DETECTED Final   Enterobacterales DETECTED (A) NOT DETECTED Final    Comment: Enterobacterales represent a large order of gram negative bacteria, not a single organism. CRITICAL RESULT CALLED TO, READ BACK BY AND VERIFIED WITH: [place], PHARMD AT 1350 ON 01/10/21 BY GM    Enterobacter cloacae complex NOT DETECTED NOT DETECTED Final   Escherichia coli DETECTED (A) NOT DETECTED Final    Comment: CRITICAL RESULT CALLED TO, READ BACK BY AND VERIFIED WITH: [place], PHARMD AT 1350 ON 01/10/21 BY GM    Klebsiella aerogenes NOT DETECTED NOT DETECTED Final   Klebsiella oxytoca NOT DETECTED NOT DETECTED Final   Klebsiella pneumoniae NOT DETECTED NOT DETECTED Final   Proteus species NOT DETECTED NOT DETECTED Final   Salmonella species NOT  DETECTED NOT DETECTED Final   Serratia marcescens NOT DETECTED NOT DETECTED Final   Haemophilus influenzae NOT DETECTED NOT DETECTED Final   Neisseria meningitidis NOT DETECTED NOT DETECTED Final   Pseudomonas aeruginosa NOT DETECTED NOT DETECTED Final   Stenotrophomonas maltophilia NOT DETECTED NOT DETECTED Final   Candida albicans NOT DETECTED NOT DETECTED Final   Candida auris NOT DETECTED  NOT DETECTED Final   Candida glabrata NOT DETECTED NOT DETECTED Final   Candida krusei NOT DETECTED NOT DETECTED Final   Candida parapsilosis NOT DETECTED NOT DETECTED Final   Candida tropicalis NOT DETECTED NOT DETECTED Final   Cryptococcus neoformans/gattii NOT DETECTED NOT DETECTED Final   CTX-M ESBL NOT DETECTED NOT DETECTED Final   Carbapenem resistance IMP NOT DETECTED NOT DETECTED Final   Carbapenem resistance KPC NOT DETECTED NOT DETECTED Final   Carbapenem resistance NDM NOT DETECTED NOT DETECTED Final   Carbapenem resist OXA 48 LIKE NOT DETECTED NOT DETECTED Final   Carbapenem resistance VIM NOT DETECTED NOT DETECTED Final    Comment: Performed at [facility], [address] [place], [place] [address]  Urine Culture     Status: Abnormal   Collection Time: 01/10/21  1:05 PM   Specimen: Urine, Random  Result Value Ref Range Status   Specimen Description   Final    URINE, RANDOM Performed at [facility], [address] [place], [place] [address]    Special Requests   Final    NONE Performed at [facility], [address] [place], [place] [address]    Culture MULTIPLE SPECIES PRESENT, SUGGEST RECOLLECTION (A)  Final   Report Status 01/11/2021 FINAL  Final    Coagulation Studies: No results for input(s): LABPROT, INR in the last 72 hours.   Urinalysis: No results for input(s): COLORURINE, LABSPEC, PHURINE, GLUCOSEU, HGBUR, BILIRUBINUR, KETONESUR, PROTEINUR, UROBILINOGEN, NITRITE, LEUKOCYTESUR in the last 72 hours.  Invalid input(s):  APPERANCEUR     Imaging: No results found.   Medications:    sodium chloride 10 mL/hr at 01/17/21 0427    ceFAZolin (ANCEF) IV 1 g (01/17/21 1707)   levETIRAcetam 500 mg (01/17/21 1021)    amiodarone  200 mg Oral BID   apixaban  5 mg Oral BID   Chlorhexidine Gluconate Cloth  6 each Topical Daily   feeding supplement (GLUCERNA SHAKE)  237 mL Oral TID BM   heparin sodium (porcine)  2,000 Units Intracatheter Once   insulin aspart  0-15 Units Subcutaneous TID WC   multivitamin with minerals  1 tablet Oral Daily   Ensure Max Protein  11 oz Oral Daily   rosuvastatin  40 mg Oral QPM   sodium zirconium cyclosilicate  10 g Oral Once   sodium chloride, acetaminophen (TYLENOL) oral liquid 160 mg/5 mL, ALPRAZolam, haloperidol lactate, ondansetron (ZOFRAN) IV, polyethylene glycol  Assessment/ Plan:  [name] is a 70 y.o.  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  Acute Kidney Injury with hyperkalemia on chronic kidney disease stage 3B with baseline creatinine 1.58 and GFR of 35 on 06/26/20.  Acute kidney injury secondary to sepsis Urinalysis on January 09, 2021: Turbid sample, greater than 50 RBCs, greater than 50 WBCs.  Urine culture negative Renal ultrasound shows Right kidney 11.5 cm, volume 222 mL, diffuse increased echogenicity.  Left kidney 11.1 cm, volume 172 mL, small left renal cyst. Dialysis initiated 01/14/21.  Patient received third dialysis treatment yesterday, UF 559ml achieved. Tolerated well. Will allow day of rest and consider dialysis tomorrow based on labs.   Lab Results  Component Value Date   CREATININE 3.68 (H) 01/17/2021   CREATININE 3.67 (H) 01/16/2021   CREATININE 4.62 (H) 01/15/2021    Intake/Output Summary (Last 24 hours) at 01/17/2021 1834  Last data filed at 01/17/2021  1553 Gross per 24 hour  Intake 1145.66 ml  Output 0 ml  Net 1145.66 ml    2. Sepsis from E. Coli bacteremia Blood cultures positive on 01/10/21 for Enterobacterials, E. Coli Receiving cefazolin with ID following   LOS: [place] 11/9/20226:34 PM

## 2021-01-17 NOTE — Plan of Care (Signed)
Pt much more alert and giving meaningful responses at times to questions and need based questions.  Pt also receptive to taking oral medication crushed in chocolate ice cream. Initially care nurse offered to crush pills and to administer with apple sauce but patient was able to state "I don't like that thing". When given options, patient selected chocolate ice cream and was able to eat the entire cup. Pt able to state her name when asked to.   Heparin gtt running at 81ml/hr. The drip was initiated after patient refused to take her eliquis for past couple of days but if she continues to improve with following command and taking her oral medication, will ask dayshift RN to bring up possibility of converting back to oral anticoagulation during morning primary team rounds.

## 2021-01-17 NOTE — Progress Notes (Signed)
Initial Nutrition Assessment  DOCUMENTATION CODES:  Obesity unspecified  INTERVENTION:  Add Glucerna Shake po TID, each supplement provides 220 kcal and 10 grams of protein.  Add Ensure Max po daily, each supplement provides 150 kcal and 30 grams of protein.    Add MVI with minerals daily.  Encourage PO intake.  Consider assisting patient with ordering meals due to delirium.  Obtain updated weight.  NUTRITION DIAGNOSIS:  Inadequate oral intake related to lethargy/confusion as evidenced by meal completion < 25%.  GOAL:  Patient will meet greater than or equal to 90% of their needs  MONITOR:  PO intake, Supplement acceptance, Labs, Weight trends, Skin, I & O's  REASON FOR ASSESSMENT:  LOS    ASSESSMENT:  70 yo female with a PMH of old left MCA territory infarct with right-sided hemiparesis, chronic atrial fibrillation, insulin-dependent diabetes mellitus, and hypertension who presented to from SNF the emergency room for change in speech and cognitive deficit. Admitted with severe sepsis present on admission secondary to E. coli bacteremia.  Per MD and RN notes, pt upset and screaming. Refusing food and PO medications.  Pre Epic, all of pt's documented meals are at 0%.  Pt with very drastic weight changes in history per Epic. RD to order measured weight to determine true change.  Recommend adding Glucerna TID, Ensure Max daily, and MVI with minerals daily to supplement intake. Also recommend  Of note, pt with generalized, severe RUE and RLE, and mild LUE and LLE edema.  Medications: reviewed; SSI, Lokelma, Keppra BID per IV  Labs: reviewed; CBG 109-147 (H) HbA1c: 6.9% (01/09/2021)  NUTRITION - FOCUSED PHYSICAL EXAM: Unable to perform - pt refused  Diet Order:   Diet Order             Diet Carb Modified Fluid consistency: Thin; Room service appropriate? Yes  Diet effective now                  EDUCATION NEEDS:  Not appropriate for education at this  time  Skin:  Skin Assessment: Skin Integrity Issues: Skin Integrity Issues:: DTI, Other (Comment) DTI: L & R buttocks Other: MASD on lower abdomen  Last BM:  01/16/21  Height:  Ht Readings from Last 1 Encounters:  01/09/21 5\' 7"  (1.702 m)   Weight:  Wt Readings from Last 1 Encounters:  01/16/21 111.1 kg   BMI:  Body mass index is 38.37 kg/m.  Estimated Nutritional Needs:  Kcal:  2000-2200 Protein:  125-140 grams Fluid:  >2 L  Derrel Nip, RD, LDN (she/her/hers) Clinical Inpatient Dietitian RD Pager/After-Hours/Weekend Pager # in [phone]

## 2021-01-18 DIAGNOSIS — A4150 Gram-negative sepsis, unspecified: Secondary | ICD-10-CM | POA: Diagnosis not present

## 2021-01-18 DIAGNOSIS — R7881 Bacteremia: Secondary | ICD-10-CM | POA: Diagnosis not present

## 2021-01-18 DIAGNOSIS — B9620 Unspecified Escherichia coli [E. coli] as the cause of diseases classified elsewhere: Secondary | ICD-10-CM | POA: Diagnosis not present

## 2021-01-18 DIAGNOSIS — N170 Acute kidney failure with tubular necrosis: Secondary | ICD-10-CM | POA: Diagnosis not present

## 2021-01-18 DIAGNOSIS — N390 Urinary tract infection, site not specified: Secondary | ICD-10-CM | POA: Diagnosis not present

## 2021-01-18 LAB — HEPATIC FUNCTION PANEL
ALT: 38 U/L (ref 0–44)
AST: 67 U/L — ABNORMAL HIGH (ref 15–41)
Albumin: 1.9 g/dL — ABNORMAL LOW (ref 3.5–5.0)
Alkaline Phosphatase: 351 U/L — ABNORMAL HIGH (ref 38–126)
Bilirubin, Direct: 0.2 mg/dL (ref 0.0–0.2)
Indirect Bilirubin: 0.6 mg/dL (ref 0.3–0.9)
Total Bilirubin: 0.8 mg/dL (ref 0.3–1.2)
Total Protein: 5.3 g/dL — ABNORMAL LOW (ref 6.5–8.1)

## 2021-01-18 LAB — GLUCOSE, CAPILLARY
Glucose-Capillary: 232 mg/dL — ABNORMAL HIGH (ref 70–99)
Glucose-Capillary: 151 mg/dL — ABNORMAL HIGH (ref 70–99)
Glucose-Capillary: 250 mg/dL — ABNORMAL HIGH (ref 70–99)

## 2021-01-18 LAB — CBC
HCT: 28.7 % — ABNORMAL LOW (ref 36.0–46.0)
Hemoglobin: 9.2 g/dL — ABNORMAL LOW (ref 12.0–15.0)
MCH: 27.5 pg (ref 26.0–34.0)
MCHC: 32.1 g/dL (ref 30.0–36.0)
MCV: 85.7 fL (ref 80.0–100.0)
Platelets: 239 10*3/uL (ref 150–400)
RBC: 3.35 MIL/uL — ABNORMAL LOW (ref 3.87–5.11)
RDW: 18.2 % — ABNORMAL HIGH (ref 11.5–15.5)
WBC: 9.7 10*3/uL (ref 4.0–10.5)
nRBC: 0 % (ref 0.0–0.2)

## 2021-01-18 LAB — BASIC METABOLIC PANEL
Anion gap: 8 (ref 5–15)
BUN: 37 mg/dL — ABNORMAL HIGH (ref 8–23)
CO2: 27 mmol/L (ref 22–32)
Calcium: 8 mg/dL — ABNORMAL LOW (ref 8.9–10.3)
Chloride: 99 mmol/L (ref 98–111)
Creatinine, Ser: 3.97 mg/dL — ABNORMAL HIGH (ref 0.44–1.00)
GFR, Estimated: 12 mL/min — ABNORMAL LOW (ref >60)
Glucose, Bld: 278 mg/dL — ABNORMAL HIGH (ref 70–99)
Potassium: 3.3 mmol/L — ABNORMAL LOW (ref 3.5–5.1)
Sodium: 134 mmol/L — ABNORMAL LOW (ref 135–145)

## 2021-01-18 MED ORDER — LEVETIRACETAM IN NACL 500 MG/100ML IV SOLN
500.0000 mg | Freq: Two times a day (BID) | INTRAVENOUS | Status: DC
  Administered 2021-01-19 – 2021-01-22 (×8): 500 mg via INTRAVENOUS
  Filled 2021-01-18 (×10): qty 100

## 2021-01-18 MED ORDER — RENA-VITE PO TABS
1.0000 | ORAL_TABLET | Freq: Every day | ORAL | Status: DC
  Administered 2021-01-18 – 2021-01-28 (×11): 1 via ORAL
  Filled 2021-01-18 (×13): qty 1

## 2021-01-18 MED ORDER — INSULIN GLARGINE-YFGN 100 UNIT/ML ~~LOC~~ SOLN
15.0000 [IU] | Freq: Every day | SUBCUTANEOUS | Status: DC
  Administered 2021-01-19 – 2021-01-29 (×9): 15 [IU] via SUBCUTANEOUS
  Filled 2021-01-18 (×14): qty 0.15

## 2021-01-18 MED ORDER — LEVETIRACETAM IN NACL 500 MG/100ML IV SOLN
500.0000 mg | Freq: Once | INTRAVENOUS | Status: AC
  Administered 2021-01-18: 500 mg via INTRAVENOUS
  Filled 2021-01-18: qty 100

## 2021-01-18 MED ORDER — POTASSIUM CHLORIDE CRYS ER 20 MEQ PO TBCR
40.0000 meq | EXTENDED_RELEASE_TABLET | Freq: Two times a day (BID) | ORAL | Status: DC
Start: 2021-01-18 — End: 2021-01-18

## 2021-01-18 MED ORDER — NEPRO/CARBSTEADY PO LIQD
237.0000 mL | Freq: Two times a day (BID) | ORAL | Status: DC
  Administered 2021-01-19 – 2021-01-29 (×16): 237 mL via ORAL

## 2021-01-18 MED ORDER — PROSOURCE PLUS PO LIQD
30.0000 mL | Freq: Three times a day (TID) | ORAL | Status: DC
  Administered 2021-01-18 – 2021-01-29 (×18): 30 mL via ORAL
  Filled 2021-01-18 (×34): qty 30

## 2021-01-18 MED ORDER — POTASSIUM CHLORIDE CRYS ER 20 MEQ PO TBCR
40.0000 meq | EXTENDED_RELEASE_TABLET | Freq: Once | ORAL | Status: AC
  Administered 2021-01-18: 40 meq via ORAL
  Filled 2021-01-18: qty 2

## 2021-01-18 MED ORDER — LEVETIRACETAM IN NACL 500 MG/100ML IV SOLN
500.0000 mg | Freq: Two times a day (BID) | INTRAVENOUS | Status: DC
  Filled 2021-01-18: qty 100

## 2021-01-18 MED ORDER — HEPARIN SODIUM (PORCINE) 1000 UNIT/ML IJ SOLN
INTRAMUSCULAR | Status: AC
  Filled 2021-01-18: qty 1

## 2021-01-18 NOTE — Progress Notes (Addendum)
Nutrition Follow-up  DOCUMENTATION CODES:   Obesity unspecified  INTERVENTION:   -D/c MVI -Renal MVI daily -D/c Glucerna -D/c Ensure Max -Nepro Shake po BID, each supplement provides 425 kcal and 19 grams protein  -30 ml Prosource Plus TID, each supplement provides 100 kcals and 15 grams protein -Feeding assistance with meals  NUTRITION DIAGNOSIS:   Inadequate oral intake related to lethargy/confusion as evidenced by meal completion < 25%.  Ongoing  GOAL:   Patient will meet greater than or equal to 90% of their needs  Progressing   MONITOR:   PO intake, Supplement acceptance, Labs, Weight trends, Skin, I & O's  REASON FOR ASSESSMENT:   LOS    ASSESSMENT:   70 yo female with a PMH of old left MCA territory infarct with right-sided hemiparesis, chronic atrial fibrillation, insulin-dependent diabetes mellitus, and hypertension who presented to from SNF the emergency room for change in speech and cognitive deficit. Admitted with severe sepsis present on admission secondary to E. coli bacteremia.  11/6- HD initiated  Reviewed I/O's: +927 ml x 24 hours and +6.7 L since admission  UOP: 0 ml x 24 hours   Pt had 4th HD treatment today. Per nephrology notes, pt may need Permacath placement.   Pt reports feeling better today, but shares she is very sleepy. Unable to provide diet history at this time.   Pt with variable oral intake. Noted meal completion 15-75%. Noted pt took a few sips of Ensure Max supplement.   Reviewed wt hx; noted no wt loss over the past month, but suspect edema may be masking true weight loss as well as fat and muscle depletions.   Lab Results  Component Value Date   HGBA1C 6.9 (H) 01/09/2021   PTA DM medications are . 20 units insulin detemir daily, 2-10 units novolin R QID, and 100 mg metformin BID.   Labs reviewed: Na: 134, K: 3.3, CBGS: 199-275 (inpatient orders for glycemic control are 0-15 units insulin aspart TID with meals and 15 units  insulin glargine-yfgn daily).    NUTRITION - FOCUSED PHYSICAL EXAM:  Flowsheet Row Most Recent Value  Orbital Region No depletion  Upper Arm Region Mild depletion  Thoracic and Lumbar Region No depletion  Buccal Region No depletion  Temple Region No depletion  Clavicle Bone Region No depletion  Clavicle and Acromion Bone Region No depletion  Scapular Bone Region No depletion  Dorsal Hand No depletion  Patellar Region No depletion  Anterior Thigh Region No depletion  Posterior Calf Region No depletion  Edema (RD Assessment) Moderate  Hair Reviewed  Eyes Reviewed  Mouth Reviewed  Skin Reviewed  Nails Reviewed       Diet Order:   Diet Order             Diet Carb Modified Fluid consistency: Thin; Room service appropriate? No  Diet effective now                   EDUCATION NEEDS:   Not appropriate for education at this time  Skin:  Skin Assessment: Skin Integrity Issues: Skin Integrity Issues:: DTI, Other (Comment) DTI: L & R buttocks Other: MASD on lower abdomen  Last BM:  01/16/21  Height:   Ht Readings from Last 1 Encounters:  01/09/21 5\' 7"  (1.702 m)    Weight:   Wt Readings from Last 1 Encounters:  01/16/21 111.1 kg   BMI:  Body mass index is 38.37 kg/m.  Estimated Nutritional Needs:   Kcal:  4481-8563  Protein:  90-105 grams  Fluid:  1000 ml + UOP    Loistine Chance, RD, LDN, [place] Registered Dietitian II Certified Diabetes Care and Education Specialist Please refer to [facility] for RD and/or RD on-call/weekend/after hours pager

## 2021-01-18 NOTE — Progress Notes (Signed)
PROGRESS NOTE  Sandra Massey    DOB: 12-Apr-1950, 70 y.o.  JAS:[id number]  PCP: [name], MD   Code Status: DNR   DOA: 01/09/2021   LOS: 9  Brief Narrative of Current Hospitalization  Sandra Massey is a 70 y.o. female with a PMH significant for Jehovah's Witness, CVA with residual right-sided hemiparesis, chronic A. fib, type II DM, HTN. They presented from SNF to the ED on 01/09/2021 with acute AMS. In the ED, it was found that they had no acute changes on head CT but abnormal electrical activity on EEG.  Neurology was consulted. They were treated with Keppra.  Additionally, patient was septic with blood cultures positive for E. coli.  She was started on IV ceftriaxone and nephrology was consulted. Patient was admitted to medicine service for further workup and management of sepsis with acute encephalopathy as outlined in detail below.  01/18/21 -stable, improved  Assessment & Plan  Principal Problem:   Acute delirium Active Problems:   ATN (acute tubular necrosis) (HCC)   Speech abnormality   Sepsis (HCC)   Cerebrovascular accident (CVA) ([place])   Altered mental status   Sepsis due to gram-negative urinary tract infection ([place])  Sepsis secondary to E. coli bacteremia with acute encephalopathy-patient continues to gradually improve alertness and more conversant today.  Again, unsure of patient's baseline.  Blood cultures positive for E. coli which was sensitive to cephalosporins.  She has not completed treatment for her infection. -Ancef (11/7-11/11) - CTX (11/3-11/6) - cefepime (11/2) - flagyl 11/2 - vancomycin 11/2 - PT/OT  AKI on CKDIIIb  hypokalemia-temporary dialysis catheter placed and received HD starting 11/6.  Creatinine 4.62> 3.67>3.97 today. Baseline around 1.58 -Nephrology following, appreciate recommendations  A. Fib  HTN-CHA2DS2-VASc score= 6.  Rate controlled. -Continue home Eliquis -Continue home amiodarone 200 mg twice daily  Type II DM- hgb A1c on  admission 6.9. blood sugars increasing as patient continues to improve her p.o. intake. - continue mSSI -Add on daily long-acting insulin  Seizure prophylaxis- no active seizure activity or repetitive motions observed -Neurology following, appreciate recommendations - continue keppra  H/o CVA  HLD-residual right-sided hemiparesis and dysphagia -Continue home rosuvastatin - psychiatry consulted-   DVT prophylaxis:   Heparin apixaban (ELIQUIS) tablet 5 mg   Diet:  Diet Orders (From admission, onward)     Start     Ordered   01/17/21 1025  Diet Carb Modified Fluid consistency: Thin; Room service appropriate? No  Diet effective now       Question Answer Comment  Diet-HS Snack? Nothing   Calorie Level Medium 1600-2000   Fluid consistency: Thin   Room service appropriate? No      01/17/21 1024            Subjective 01/18/21    Pt reports no complaints today.  She is oriented to self and place.  Disposition Plan & Communication  Patient status: Inpatient  Admitted From: SNF Disposition: Skilled nursing facility Anticipated discharge date: TBD, pending return to baseline and resolution of AKI  Family Communication: none at bedside  Consults, Procedures, Significant Events  Consultants:  ID Neurology nephrology  Procedures/significant events:  Temp cath, started HD  Antimicrobials:  Anti-infectives (From admission, onward)    Start     Dose/Rate Route Frequency Ordered Stop   01/16/21 1800  ceFAZolin (ANCEF) IVPB 1 g/50 mL premix        1 g 100 mL/hr over 30 Minutes Intravenous Every 24 hours 01/15/21 1418 01/18/21 2359  01/15/21 1800  ceFAZolin (ANCEF) IVPB 2g/100 mL premix        2 g 200 mL/hr over 30 Minutes Intravenous  Once 01/15/21 1417 01/15/21 1822   01/15/21 0900  ceFAZolin (ANCEF) IVPB 1 g/50 mL premix  Status:  Discontinued        1 g 100 mL/hr over 30 Minutes Intravenous Every 24 hours 01/14/21 1502 01/15/21 1417   01/11/21 1300  vancomycin  (VANCOREADY) IVPB 750 mg/150 mL  Status:  Discontinued        750 mg 150 mL/hr over 60 Minutes Intravenous Every 24 hours 01/10/21 0235 01/10/21 1116   01/11/21 0800  vancomycin (VANCOREADY) IVPB 1500 mg/300 mL  Status:  Discontinued        1,500 mg 150 mL/hr over 120 Minutes Intravenous Every 48 hours 01/10/21 1116 01/10/21 1448   01/11/21 0800  cefTRIAXone (ROCEPHIN) 2 g in sodium chloride 0.9 % 100 mL IVPB  Status:  Discontinued        2 g 200 mL/hr over 30 Minutes Intravenous Every 24 hours 01/10/21 1448 01/14/21 1502   01/10/21 1200  ceFEPIme (MAXIPIME) 2 g in sodium chloride 0.9 % 100 mL IVPB  Status:  Discontinued        2 g 200 mL/hr over 30 Minutes Intravenous Every 12 hours 01/10/21 0219 01/10/21 1448   01/09/21 2245  metroNIDAZOLE (FLAGYL) IVPB 500 mg  Status:  Discontinued        500 mg 100 mL/hr over 60 Minutes Intravenous 2 times daily 01/09/21 2228 01/10/21 1448   01/09/21 2245  vancomycin (VANCOREADY) IVPB 2000 mg/400 mL        2,000 mg 200 mL/hr over 120 Minutes Intravenous  Once 01/09/21 2234 01/10/21 0708   01/09/21 2230  ceFEPIme (MAXIPIME) 2 g in sodium chloride 0.9 % 100 mL IVPB        2 g 200 mL/hr over 30 Minutes Intravenous  Once 01/09/21 2228 01/10/21 0204   01/09/21 2230  vancomycin (VANCOCIN) IVPB 1000 mg/200 mL premix  Status:  Discontinued        1,000 mg 200 mL/hr over 60 Minutes Intravenous  Once 01/09/21 2228 01/09/21 2234       Objective   Vitals:   01/17/21 1553 01/17/21 2003 01/18/21 0059 01/18/21 0354  BP: 116/67 131/77 125/75 115/87  Pulse: 70 84 75 79  Resp: 17 18 18 18   Temp: 97.7 F (36.5 C) 97.9 F (36.6 C) 97.8 F (36.6 C) 97.8 F (36.6 C)  TempSrc:      SpO2: 92% 95% 90% 90%  Weight:      Height:        Intake/Output Summary (Last 24 hours) at 01/18/2021 0813 Last data filed at 01/17/2021 2019 Gross per 24 hour  Intake 926.73 ml  Output 0 ml  Net 926.73 ml    Filed Weights   01/09/21 1352 01/16/21 0900  Weight: 136.1 kg  111.1 kg    Patient BMI: Body mass index is 38.37 kg/m.   Physical Exam: General: awake, alert, NAD HEENT: atraumatic, clear conjunctiva, anicteric sclera, moist mucus membranes, hearing grossly normal Respiratory: normal respiratory effort. Cardiovascular: normal S1/S2,  RRR, no JVD, murmurs, rubs, gallops, quick capillary refill  Gastrointestinal: soft, NT, ND, no HSM felt Nervous: Alert, oriented to self.  Answers questions with one-word answers which are intermittently appropriate. Extremities: non-pitting LE edema, normal tone Skin: dry, intact, normal temperature, normal color, No rashes, lesions or ulcers on exposed skin  Labs   I  have personally reviewed following labs and imaging studies BMP Latest Ref Rng & Units 01/18/2021 01/17/2021 01/16/2021  Glucose 70 - 99 mg/dL 278(H) 220(H) 127(H)  BUN 8 - 23 mg/dL 37(H) 30(H) 37(H)  Creatinine 0.44 - 1.00 mg/dL 3.97(H) 3.68(H) 3.67(H)  Sodium 135 - 145 mmol/L 134(L) 136 139  Potassium 3.5 - 5.1 mmol/L 3.3(L) 3.5 3.6  Chloride 98 - 111 mmol/L 99 99 100  CO2 22 - 32 mmol/L 27 26 21(L)  Calcium 8.9 - 10.3 mg/dL 8.0(L) 8.1(L) 6.9(L)   CBC    Component Value Date/Time   WBC 9.7 01/18/2021 0440   RBC 3.35 (L) 01/18/2021 0440   HGB 9.2 (L) 01/18/2021 0440   HCT 28.7 (L) 01/18/2021 0440   PLT 239 01/18/2021 0440   MCV 85.7 01/18/2021 0440   MCH 27.5 01/18/2021 0440   MCHC 32.1 01/18/2021 0440   RDW 18.2 (H) 01/18/2021 0440   LYMPHSABS 1.3 01/10/2021 0009   MONOABS 1.2 (H) 01/10/2021 0009   EOSABS 0.0 01/10/2021 0009   BASOSABS 0.1 01/10/2021 0009   Imaging Studies  No results found. Medications   Scheduled Meds:  amiodarone  200 mg Oral BID   apixaban  5 mg Oral BID   Chlorhexidine Gluconate Cloth  6 each Topical Daily   feeding supplement (GLUCERNA SHAKE)  237 mL Oral TID BM   heparin sodium (porcine)  2,000 Units Intracatheter Once   insulin aspart  0-15 Units Subcutaneous TID WC   multivitamin with minerals  1 tablet  Oral Daily   Ensure Max Protein  11 oz Oral Daily   rosuvastatin  40 mg Oral QPM   sodium zirconium cyclosilicate  10 g Oral Once   No recently discontinued medications to reconcile  LOS: 9 days   Time spent: >48min  Parv Manthey L Tabor Denham, DO [facility] 01/18/2021, 8:13 AM   Please refer to amion to contact the [facility] Attending or Consulting provider for this pt  [email or web address] Available by Epic secure chat 7AM-7PM. If 7PM-7AM, please contact night-coverage

## 2021-01-18 NOTE — Consult Note (Signed)
test

## 2021-01-18 NOTE — Progress Notes (Signed)
Inpatient Diabetes Program Recommendations  AACE/ADA: New Consensus Statement on Inpatient Glycemic Control (2015)  Target Ranges:  Prepandial:   less than 140 mg/dL      Peak postprandial:   less than 180 mg/dL (1-2 hours)      Critically ill patients:  140 - 180 mg/dL   Lab Results  Component Value Date   GLUCAP 232 (H) 01/18/2021   HGBA1C 6.9 (H) 01/09/2021    Review of Glycemic Control Results for Sandra Massey, Sandra Massey (MRN [id number]) as of 01/18/2021 11:21  Ref. Range 01/17/2021 08:19 01/17/2021 11:39 01/17/2021 16:22 01/17/2021 20:24 01/18/2021 08:16  Glucose-Capillary Latest Ref Range: 70 - 99 mg/dL 139 (H) 181 (H) 199 (H) 275 (H) 232 (H)    Diabetes history: DM 2 Outpatient Diabetes medications:  Levemir 20 units at 12 noon, Novolin R 2-10 units four times a day, Metformin 1000 mg bid Current orders for Inpatient glycemic control:  Novolog moderate tid with meals  Inpatient Diabetes Program Recommendations:    Fasting BG=232 mg/dL- Consider adding Levemir 5 units bid- (this is 1/2 of home dose). Also please d/c Metformin from home meds. due to renal failure and ESRD.   Thanks,   Adah Perl, RN, BC-ADM Inpatient Diabetes Coordinator Pager [phone]  (8a-5p)

## 2021-01-18 NOTE — Progress Notes (Signed)
[facility]  ROUNDING NOTE   Subjective:   Patient seen and evaluated during dialysis   HEMODIALYSIS FLOWSHEET:  Blood Flow Rate (mL/min): 400 mL/min Arterial Pressure (mmHg): -160 mmHg Venous Pressure (mmHg): 160 mmHg Transmembrane Pressure (mmHg): 50 mmHg Ultrafiltration Rate (mL/min): 330 mL/min Dialysate Flow Rate (mL/min): 500 ml/min Conductivity: Machine : 13.9 Conductivity: Machine : 13.9 Dialysis Fluid Bolus: Normal Saline Bolus Amount (mL): 250 mL  Remains alert but confused at times No complaints at this time  Objective:  Vital signs in last 24 hours:  Temp:  [97.5 F (36.4 C)-97.9 F (36.6 C)] 97.7 F (36.5 C) (11/10 1459) Pulse Rate:  [40-85] 54 (11/10 1459) Resp:  [14-22] 18 (11/10 1459) BP: (99-131)/(62-87) 120/63 (11/10 1459) SpO2:  [90 %-100 %] 92 % (11/10 1459)  Weight change:  Filed Weights   01/09/21 1352 01/16/21 0900  Weight: 136.1 kg 111.1 kg    Intake/Output: I/O last 3 completed shifts: In: 1535.7 [P.O.:750; I.V.:385.7; IV Piggyback:400] Out: 0    Intake/Output this shift:  Total I/O In: -  Out: 500 [Other:500]  Physical Exam: General: NAD  Head: Normocephalic, atraumatic. Moist oral mucosal membranes  Eyes: Anicteric  Lungs:  Clear to auscultation, normal effort  Heart: No rub, irregular rhythm  Abdomen:  Soft, nontender  Extremities:  no peripheral edema.  Neurologic: Aphasic, alert,following simple commands  Skin: No lesions  Access Rt femoral trialysis dialysis catheter    Basic Metabolic Panel: Recent Labs  Lab 01/12/21 0541 01/12/21 1200 01/13/21 0607 01/14/21 0547 01/15/21 0527 01/16/21 0514 01/17/21 1456 01/18/21 0440  NA 140   < > 139 139 139 139 136 134*  K 4.8   < > 5.0 5.2* 5.8* 3.6 3.5 3.3*  CL 107   < > 108 106 105 100 99 99  CO2 21*   < > 16* 14* 16* 21* 26 27  GLUCOSE 118*   < > 145* 127* 99 127* 220* 278*  BUN 63*   < > 72* 74* 57* 37* 30* 37*  CREATININE 4.14*   < > 4.86* 5.35*  4.62* 3.67* 3.68* 3.97*  CALCIUM 7.6*   < > 7.3* 7.2* 6.9* 6.9* 8.1* 8.0*  MG 2.1  --  2.2 2.5*  --   --   --   --   PHOS  --   --  5.8*  --   --   --   --   --    < > = values in this interval not displayed.     Liver Function Tests: Recent Labs  Lab 01/12/21 0541 01/12/21 1200 01/15/21 2254  AST 149* 132* 147*  ALT 178* 164* 147*  ALKPHOS 137* 140* 387*  BILITOT 1.1 0.9 1.8*  PROT 6.0* 5.6* 6.0*  ALBUMIN 2.4* 2.4* 2.2*    No results for input(s): LIPASE, AMYLASE in the last 168 hours. No results for input(s): AMMONIA in the last 168 hours.  CBC: Recent Labs  Lab 01/12/21 0541 01/13/21 0607 01/14/21 0547 01/17/21 1456 01/18/21 0440  WBC 16.3* 12.7* 9.7 8.8 9.7  HGB 10.3* 10.0* 9.9* 9.7* 9.2*  HCT 33.1* 31.4* 31.5* 29.9* 28.7*  MCV 91.4 90.2 87.7 84.7 85.7  PLT 161 154 184 237 239     Cardiac Enzymes: Recent Labs  Lab 01/13/21 0607  CKTOTAL 252*     BNP: Invalid input(s): POCBNP  CBG: Recent Labs  Lab 01/17/21 0819 01/17/21 1139 01/17/21 1622 01/17/21 2024 01/18/21 0816  GLUCAP 139* 181* 199* 275* 232*  Microbiology: Results for orders placed or performed during the hospital encounter of 01/09/21  Resp Panel by RT-PCR (Flu A&B, Covid) Nasopharyngeal Swab     Status: None   Collection Time: 01/09/21  2:17 PM   Specimen: Nasopharyngeal Swab; Nasopharyngeal(NP) swabs in vial transport medium  Result Value Ref Range Status   SARS Coronavirus 2 by RT PCR NEGATIVE NEGATIVE Final    Comment: (NOTE) SARS-CoV-2 target nucleic acids are NOT DETECTED.  The SARS-CoV-2 RNA is generally detectable in upper respiratory specimens during the acute phase of infection. The lowest concentration of SARS-CoV-2 viral copies this assay can detect is 138 copies/mL. A negative result does not preclude SARS-Cov-2 infection and should not be used as the sole basis for treatment or other patient management decisions. A negative result may occur with  improper  specimen collection/handling, submission of specimen other than nasopharyngeal swab, presence of viral mutation(s) within the areas targeted by this assay, and inadequate number of viral copies(<138 copies/mL). A negative result must be combined with clinical observations, patient history, and epidemiological information. The expected result is Negative.  Fact Sheet for Patients:  EntrepreneurPulse.com.au  Fact Sheet for Healthcare Providers:  IncredibleEmployment.be  This test is no t yet approved or cleared by the [place] FDA and  has been authorized for detection and/or diagnosis of SARS-CoV-2 by FDA under an Emergency Use Authorization (EUA). This EUA will remain  in effect (meaning this test can be used) for the duration of the COVID-19 declaration under Section 564(b)(1) of the Act, 21 U.S.C.section 360bbb-3(b)(1), unless the authorization is terminated  or revoked sooner.       Influenza A by PCR NEGATIVE NEGATIVE Final   Influenza B by PCR NEGATIVE NEGATIVE Final    Comment: (NOTE) The Xpert Xpress SARS-CoV-2/FLU/RSV plus assay is intended as an aid in the diagnosis of influenza from Nasopharyngeal swab specimens and should not be used as a sole basis for treatment. Nasal washings and aspirates are unacceptable for Xpert Xpress SARS-CoV-2/FLU/RSV testing.  Fact Sheet for Patients: EntrepreneurPulse.com.au  Fact Sheet for Healthcare Providers: IncredibleEmployment.be  This test is not yet approved or cleared by the [place] FDA and has been authorized for detection and/or diagnosis of SARS-CoV-2 by FDA under an Emergency Use Authorization (EUA). This EUA will remain in effect (meaning this test can be used) for the duration of the COVID-19 declaration under Section 564(b)(1) of the Act, 21 U.S.C. section 360bbb-3(b)(1), unless the authorization is terminated or revoked.  Performed at  [facility], [address] [place], [place] [address]   Culture, blood (Routine X 2) w Reflex to ID Panel     Status: Abnormal   Collection Time: 01/10/21 12:09 AM   Specimen: BLOOD  Result Value Ref Range Status   Specimen Description   Final    BLOOD RIGHT FOREARM Performed at [facility], [address], [place], [place] [address]    Special Requests   Final    BOTTLES DRAWN AEROBIC AND ANAEROBIC Blood Culture results may not be optimal due to an excessive volume of blood received in culture bottles Performed at [facility], [address], [place], [place] [address]    Culture  Setup Time   Final    GRAM NEGATIVE RODS IN BOTH AEROBIC AND ANAEROBIC BOTTLES CRITICAL RESULT CALLED TO, READ BACK BY AND VERIFIED WITH: PHARMD S HALLHAI [id number] AT 815 AM BY CM    Culture (A)  Final    ESCHERICHIA COLI SUSCEPTIBILITIES PERFORMED ON  PREVIOUS CULTURE WITHIN THE LAST 5 DAYS. Performed at [facility], [place] [address] [place], [place] [address]    Report Status 01/13/2021 FINAL  Final  Culture, blood (Routine X 2) w Reflex to ID Panel     Status: Abnormal   Collection Time: 01/10/21 12:09 AM   Specimen: BLOOD  Result Value Ref Range Status   Specimen Description   Final    BLOOD LEFT ANTECUBITAL Performed at [facility], [place] [address] [place], [place] [address]    Special Requests   Final    BOTTLES DRAWN AEROBIC AND ANAEROBIC Blood Culture adequate volume Performed at [facility] [place], [place] [address]    Culture  Setup Time   Final    GRAM NEGATIVE RODS IN BOTH AEROBIC AND ANAEROBIC BOTTLES CRITICAL RESULT CALLED TO, READ BACK BY AND VERIFIED WITH: RAQUEL GOODMAN, PHARMD AT 1350 ON 01/10/21 BY GM Performed at [facility], [place] [address] [place], [place] [address]    Culture ESCHERICHIA COLI (A)  Final   Report Status 01/13/2021 FINAL  Final   Organism ID, Bacteria ESCHERICHIA COLI  Final       Susceptibility   Escherichia coli - MIC*    AMPICILLIN >=32 RESISTANT Resistant     CEFAZOLIN <=4 SENSITIVE Sensitive     CEFEPIME <=0.12 SENSITIVE Sensitive     CEFTAZIDIME <=1 SENSITIVE Sensitive     CEFTRIAXONE <=0.25 SENSITIVE Sensitive     CIPROFLOXACIN <=0.25 SENSITIVE Sensitive     GENTAMICIN 8 INTERMEDIATE Intermediate     IMIPENEM <=0.25 SENSITIVE Sensitive     TRIMETH/SULFA >=320 RESISTANT Resistant     AMPICILLIN/SULBACTAM >=32 RESISTANT Resistant     PIP/TAZO <=4 SENSITIVE Sensitive     * ESCHERICHIA COLI  Blood Culture ID Panel (Reflexed)     Status: Abnormal   Collection Time: 01/10/21 12:09 AM  Result Value Ref Range Status   Enterococcus faecalis NOT DETECTED NOT DETECTED Final   Enterococcus Faecium NOT DETECTED NOT DETECTED Final   Listeria monocytogenes NOT DETECTED NOT DETECTED Final   Staphylococcus species NOT DETECTED NOT DETECTED Final   Staphylococcus aureus (BCID) NOT DETECTED NOT DETECTED Final   Staphylococcus epidermidis NOT DETECTED NOT DETECTED Final   Staphylococcus lugdunensis NOT DETECTED NOT DETECTED Final   Streptococcus species NOT DETECTED NOT DETECTED Final   Streptococcus agalactiae NOT DETECTED NOT DETECTED Final   Streptococcus pneumoniae NOT DETECTED NOT DETECTED Final   Streptococcus pyogenes NOT DETECTED NOT DETECTED Final   A.calcoaceticus-baumannii NOT DETECTED NOT DETECTED Final   Bacteroides fragilis NOT DETECTED NOT DETECTED Final   Enterobacterales DETECTED (A) NOT DETECTED Final    Comment: Enterobacterales represent a large order of gram negative bacteria, not a single organism. CRITICAL RESULT CALLED TO, READ BACK BY AND VERIFIED WITH: RAQUEL GOODMAN, PHARMD AT 1350 ON 01/10/21 BY GM    Enterobacter cloacae complex NOT DETECTED NOT DETECTED Final   Escherichia coli DETECTED (A) NOT DETECTED Final    Comment: CRITICAL RESULT CALLED TO, READ BACK BY AND VERIFIED WITH: [place], PHARMD AT 1350 ON 01/10/21 BY GM     Klebsiella aerogenes NOT DETECTED NOT DETECTED Final   Klebsiella oxytoca NOT DETECTED NOT DETECTED Final   Klebsiella pneumoniae NOT DETECTED NOT DETECTED Final   Proteus species NOT DETECTED NOT DETECTED Final   Salmonella species NOT DETECTED NOT DETECTED Final   Serratia marcescens NOT DETECTED NOT DETECTED Final   Haemophilus influenzae NOT DETECTED NOT  DETECTED Final   Neisseria meningitidis NOT DETECTED NOT DETECTED Final   Pseudomonas aeruginosa NOT DETECTED NOT DETECTED Final   Stenotrophomonas maltophilia NOT DETECTED NOT DETECTED Final   Candida albicans NOT DETECTED NOT DETECTED Final   Candida auris NOT DETECTED NOT DETECTED Final   Candida glabrata NOT DETECTED NOT DETECTED Final   Candida krusei NOT DETECTED NOT DETECTED Final   Candida parapsilosis NOT DETECTED NOT DETECTED Final   Candida tropicalis NOT DETECTED NOT DETECTED Final   Cryptococcus neoformans/gattii NOT DETECTED NOT DETECTED Final   CTX-M ESBL NOT DETECTED NOT DETECTED Final   Carbapenem resistance IMP NOT DETECTED NOT DETECTED Final   Carbapenem resistance KPC NOT DETECTED NOT DETECTED Final   Carbapenem resistance NDM NOT DETECTED NOT DETECTED Final   Carbapenem resist OXA 48 LIKE NOT DETECTED NOT DETECTED Final   Carbapenem resistance VIM NOT DETECTED NOT DETECTED Final    Comment: Performed at [facility], [address], [place], [place] [address]  Urine Culture     Status: Abnormal   Collection Time: 01/10/21  1:05 PM   Specimen: Urine, Random  Result Value Ref Range Status   Specimen Description   Final    URINE, RANDOM Performed at [facility], [address], [place], [place] [address]    Special Requests   Final    NONE Performed at [facility], [address] [place], [place] [address]    Culture MULTIPLE SPECIES PRESENT, SUGGEST RECOLLECTION (A)  Final   Report Status 01/11/2021 FINAL  Final    Coagulation Studies: No results for input(s): LABPROT,  INR in the last 72 hours.   Urinalysis: No results for input(s): COLORURINE, LABSPEC, PHURINE, GLUCOSEU, HGBUR, BILIRUBINUR, KETONESUR, PROTEINUR, UROBILINOGEN, NITRITE, LEUKOCYTESUR in the last 72 hours.  Invalid input(s): APPERANCEUR     Imaging: No results found.   Medications:    sodium chloride 10 mL/hr at 01/17/21 0427    ceFAZolin (ANCEF) IV Stopped (01/17/21 1900)   levETIRAcetam 500 mg (01/18/21 1528)   levETIRAcetam     [START ON 01/19/2021] levETIRAcetam      amiodarone  200 mg Oral BID   apixaban  5 mg Oral BID   Chlorhexidine Gluconate Cloth  6 each Topical Daily   feeding supplement (GLUCERNA SHAKE)  237 mL Oral TID BM   heparin sodium (porcine)       heparin sodium (porcine)  2,000 Units Intracatheter Once   insulin aspart  0-15 Units Subcutaneous TID WC   [START ON 01/19/2021] insulin glargine-yfgn  15 Units Subcutaneous Daily   multivitamin with minerals  1 tablet Oral Daily   Ensure Max Protein  11 oz Oral Daily   rosuvastatin  40 mg Oral QPM   sodium chloride, acetaminophen (TYLENOL) oral liquid 160 mg/5 mL, ALPRAZolam, haloperidol lactate, ondansetron (ZOFRAN) IV, polyethylene glycol  Assessment/ Plan:  Sandra Massey is a 70 y.o.  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  Acute Kidney Injury with hyperkalemia on chronic kidney disease stage 3B with baseline creatinine 1.58 and GFR of 35 on 06/26/20.  Acute kidney injury secondary to sepsis Urinalysis on January 09, 2021: Turbid sample, greater than 50 RBCs, greater than 50 WBCs.  Urine culture negative Renal ultrasound shows Right kidney 11.5 cm, volume 222 mL, diffuse increased echogenicity.  Left kidney 11.1 cm, volume 172 mL, small  left renal cyst. Dialysis initiated 01/14/21.   Patient received dialysis today, UF goal 500  mL achieved.  If dialysis will continue, patient will need PermCath placement.  Dialysis coordinator aware of patient and awaiting outpatient needs.  We will continue to monitor patient's recovery.  Lab Results  Component Value Date   CREATININE 3.97 (H) 01/18/2021   CREATININE 3.68 (H) 01/17/2021   CREATININE 3.67 (H) 01/16/2021    Intake/Output Summary (Last 24 hours) at 01/18/2021 1535 Last data filed at 01/18/2021 1400 Gross per 24 hour  Intake 390 ml  Output 500 ml  Net -110 ml    2. Sepsis from E. Coli bacteremia Blood cultures positive on 01/10/21 for Enterobacterials, E. Coli Receiving cefazolin with ID following   LOS: 9   11/10/20223:35 PM

## 2021-01-18 NOTE — Progress Notes (Signed)
ID Dialysis on hold so as to assess improvement in kidney function Cr improving No fever  Awake and alert Still has expressive aphasia But watching jeopardy and laughing and clapping hands  BP 120/63 (BP Location: Left Arm)   Pulse (!) 54   Temp 97.7 F (36.5 C)   Resp 18   Ht 5\' 7"  (1.702 m)   Wt 111.1 kg Comment: bedscale  SpO2 92%   BMI 38.37 kg/m   Chest b/l air entry Hss1s2  Abd soft Rt hemiparesis Edema rt > left lower extremity Rt femoral HD catheter    Labs CBC Latest Ref Rng & Units 01/18/2021 01/17/2021 01/14/2021  WBC 4.0 - 10.5 K/uL 9.7 8.8 9.7  Hemoglobin 12.0 - 15.0 g/dL 9.2(L) 9.7(L) 9.9(L)  Hematocrit 36.0 - 46.0 % 28.7(L) 29.9(L) 31.5(L)  Platelets 150 - 400 K/uL 239 237 184    CMP Latest Ref Rng & Units 01/18/2021 01/17/2021 01/16/2021  Glucose 70 - 99 mg/dL 278(H) 220(H) 127(H)  BUN 8 - 23 mg/dL 37(H) 30(H) 37(H)  Creatinine 0.44 - 1.00 mg/dL 3.97(H) 3.68(H) 3.67(H)  Sodium 135 - 145 mmol/L 134(L) 136 139  Potassium 3.5 - 5.1 mmol/L 3.3(L) 3.5 3.6  Chloride 98 - 111 mmol/L 99 99 100  CO2 22 - 32 mmol/L 27 26 21(L)  Calcium 8.9 - 10.3 mg/dL 8.0(L) 8.1(L) 6.9(L)  Total Protein 6.5 - 8.1 g/dL - - -  Total Bilirubin 0.3 - 1.2 mg/dL - - -  Alkaline Phos 38 - 126 U/L - - -  AST 15 - 41 U/L - - -  ALT 0 - 44 U/L - - -    Micro 01/10/21- BC- e.coli  Impression/recommendation E. 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

## 2021-01-19 DIAGNOSIS — A021 Salmonella sepsis: Secondary | ICD-10-CM

## 2021-01-19 DIAGNOSIS — R400 Somnolence: Secondary | ICD-10-CM

## 2021-01-19 DIAGNOSIS — A4150 Gram-negative sepsis, unspecified: Secondary | ICD-10-CM | POA: Diagnosis not present

## 2021-01-19 DIAGNOSIS — N170 Acute kidney failure with tubular necrosis: Secondary | ICD-10-CM | POA: Diagnosis not present

## 2021-01-19 DIAGNOSIS — N390 Urinary tract infection, site not specified: Secondary | ICD-10-CM | POA: Diagnosis not present

## 2021-01-19 LAB — CBC
HCT: 27.1 % — ABNORMAL LOW (ref 36.0–46.0)
Hemoglobin: 8.7 g/dL — ABNORMAL LOW (ref 12.0–15.0)
MCH: 28.2 pg (ref 26.0–34.0)
MCHC: 32.1 g/dL (ref 30.0–36.0)
MCV: 88 fL (ref 80.0–100.0)
Platelets: 256 10*3/uL (ref 150–400)
RBC: 3.08 MIL/uL — ABNORMAL LOW (ref 3.87–5.11)
RDW: 18 % — ABNORMAL HIGH (ref 11.5–15.5)
WBC: 11.7 10*3/uL — ABNORMAL HIGH (ref 4.0–10.5)
nRBC: 0.2 % (ref 0.0–0.2)

## 2021-01-19 LAB — GLUCOSE, CAPILLARY
Glucose-Capillary: 200 mg/dL — ABNORMAL HIGH (ref 70–99)
Glucose-Capillary: 228 mg/dL — ABNORMAL HIGH (ref 70–99)
Glucose-Capillary: 245 mg/dL — ABNORMAL HIGH (ref 70–99)
Glucose-Capillary: 188 mg/dL — ABNORMAL HIGH (ref 70–99)

## 2021-01-19 LAB — BASIC METABOLIC PANEL
Anion gap: 8 (ref 5–15)
BUN: 20 mg/dL (ref 8–23)
CO2: 28 mmol/L (ref 22–32)
Calcium: 8 mg/dL — ABNORMAL LOW (ref 8.9–10.3)
Chloride: 100 mmol/L (ref 98–111)
Creatinine, Ser: 2.79 mg/dL — ABNORMAL HIGH (ref 0.44–1.00)
GFR, Estimated: 18 mL/min — ABNORMAL LOW (ref >60)
Glucose, Bld: 216 mg/dL — ABNORMAL HIGH (ref 70–99)
Potassium: 3.6 mmol/L (ref 3.5–5.1)
Sodium: 136 mmol/L (ref 135–145)

## 2021-01-19 MED ORDER — CEFAZOLIN SODIUM-DEXTROSE 1-4 GM/50ML-% IV SOLN
1.0000 g | Freq: Two times a day (BID) | INTRAVENOUS | Status: AC
  Administered 2021-01-19 – 2021-01-20 (×4): 1 g via INTRAVENOUS
  Filled 2021-01-19 (×4): qty 50

## 2021-01-19 NOTE — TOC Progression Note (Signed)
Transition of Care [facility]) - Progression Note    Patient Details  Name: Sandra Massey MRN: [id number] Date of Birth: Feb 13, 1951  Transition of Care [facility]) CM/SW [place], LCSW Phone Number: 01/19/2021, 10:31 AM  Clinical Narrative:   CSW spoke with [facility]ina at [facility] and they are reviewing pt for return.    Expected Discharge Plan:  (TBD) Barriers to Discharge: Continued Medical Work up  Expected Discharge Plan and Services Expected Discharge Plan:  (TBD)       Living arrangements for the past 2 months: [place]                                       Social Determinants of Health (SDOH) Interventions    Readmission Risk Interventions No flowsheet data found.

## 2021-01-19 NOTE — Progress Notes (Signed)
PHARMACY NOTE:  ANTIMICROBIAL RENAL DOSAGE ADJUSTMENT  Current antimicrobial regimen includes a mismatch between antimicrobial dosage and estimated renal function.  As per policy approved by the Pharmacy & Therapeutics and Medical Executive Committees, the antimicrobial dosage will be adjusted accordingly.  Current antimicrobial dosage:  cefazolin IV 1g q24h  Indication: bacteremia  Renal Function:  Estimated Creatinine Clearance: 24.8 mL/min (A) (by C-G formula based on SCr of 2.79 mg/dL (H)). [x]      Temp -HD for AKI, (paused/monitoring off):     Antimicrobial dosage has been changed to:  cefazolin 1g IV q12h  Additional comments: Nephrology & ID consulted. Per nephro, holding further HD to assess any renal recovery. Scr is improved today. Adjusted dosing from HD >> current renal function. Will CTM plan and need for adjustment.  Thank you for allowing pharmacy to be a part of this patient's care.  Lorna Dibble, [facility] 01/19/2021 7:35 AM

## 2021-01-19 NOTE — NC FL2 (Signed)
[place] LEVEL OF CARE SCREENING TOOL     IDENTIFICATION  Patient Name: Sandra Massey Birthdate: 1950-12-27 Sex: female Admission Date (Current Location): 01/09/2021  [place] and [place] Number:  Engineering geologist and Address:  [facility], [address] [place], [place] [address]      Provider Number: [id number]  Attending Physician Name and Address:  Richarda Osmond, MD  Relative Name and Phone Number:       Current Level of Care: Hospital Recommended Level of Care: [place] Prior Approval Number:    Date Approved/Denied:   PASRR Number:    Discharge Plan: SNF    Current Diagnoses: Patient Active Problem List   Diagnosis Date Noted   Sepsis due to gram-negative urinary tract infection ([place])    Acute delirium 01/16/2021   Cerebrovascular accident (CVA) ([place])    Altered mental status    Speech abnormality 01/09/2021   Sepsis ([place]) 01/09/2021   AKI (acute kidney injury) ([place]) 06/24/2020   Acute kidney injury superimposed on chronic kidney disease ([place]) 06/23/2020   Hypoglycemia associated with diabetes ([place]) 06/23/2020   Hypokalemia 06/23/2020   Chronic atrial fibrillation (HCC)    CKD (chronic kidney disease), stage III ([place])    History of ischemic cerebrovascular accident (CVA) with residual deficit    Heel fracture 04/07/2020   Closed right calcaneal fracture 04/06/2020   Essential hypertension 04/06/2020   Pressure injury of skin 06/05/2019   Weakness    Sepsis with acute renal failure and septic shock ([place]) 06/01/2019   ATN (acute tubular necrosis) (HCC)    Lactic acidosis    Chronic a-fib (HCC)    Kidney stones    DKA (diabetic ketoacidoses) 05/31/2019    Orientation RESPIRATION BLADDER Height & Weight     Self  O2 ([place] 2.5L) Incontinent Weight: 245 lb (111.1 kg) (bedscale) Height:  5\' 7"  (170.2 cm)  BEHAVIORAL SYMPTOMS/MOOD NEUROLOGICAL BOWEL NUTRITION STATUS      Incontinent Diet  (carb modified)  AMBULATORY STATUS COMMUNICATION OF NEEDS Skin   Extensive Assist Verbally Other (Comment) (Pressure injury- right and left buttocks, foam dressing. Open wound on lower abdomen)                       Personal Care Assistance Level of Assistance  Bathing, Dressing, Feeding Bathing Assistance: Maximum assistance Feeding assistance: Limited assistance Dressing Assistance: Maximum assistance     Functional Limitations Info  Sight, Hearing, Speech Sight Info: Adequate Hearing Info: Adequate Speech Info: Adequate    SPECIAL CARE FACTORS FREQUENCY  PT (By licensed PT), OT (By licensed OT)                    Contractures Contractures Info: Not present    Additional Factors Info  Code Status, Allergies Code Status Info: DNR Allergies Info: Latex           Current Medications (01/19/2021):  This is the current hospital active medication list Current Facility-Administered Medications  Medication Dose Route Frequency Provider Last Rate Last Admin   (feeding supplement) PROSource Plus liquid 30 mL  30 mL Oral TID BM Doristine Mango L, MD   30 mL at 01/18/21 1709   0.9 %  sodium chloride infusion   Intravenous PRN Fritzi Mandes, MD 10 mL/hr at 01/17/21 0427 Rate Change at 01/17/21 0427   acetaminophen (TYLENOL) 160 MG/5ML solution 650 mg  650 mg Oral Q6H PRN Sharion Settler, NP  650 mg at 01/18/21 2110   ALPRAZolam Duanne Moron) tablet 0.5 mg  0.5 mg Oral BID PRN Shawna Clamp, MD   0.5 mg at 01/18/21 2110   amiodarone (PACERONE) tablet 200 mg  200 mg Oral BID Enzo Bi, MD   200 mg at 01/19/21 0908   apixaban (ELIQUIS) tablet 5 mg  5 mg Oral BID Richarda Osmond, MD   5 mg at 01/19/21 8938   ceFAZolin (ANCEF) IVPB 1 g/50 mL premix  1 g Intravenous Q12H Beers, Shanon Brow, RPH       Chlorhexidine Gluconate Cloth 2 % PADS 6 each  6 each Topical Daily Shawna Clamp, MD   6 each at 01/19/21 0914   feeding supplement (NEPRO CARB STEADY) liquid 237 mL  237 mL Oral  BID BM Richarda Osmond, MD       haloperidol lactate (HALDOL) injection 1 mg  1 mg Intravenous Q6H PRN Clapacs, John T, MD       heparin sodium (porcine) injection 2,000 Units  2,000 Units Intracatheter Once Esco, Miechia A, MD       insulin aspart (novoLOG) injection 0-15 Units  0-15 Units Subcutaneous TID WC Enzo Bi, MD   3 Units at 01/19/21 0907   insulin glargine-yfgn (SEMGLEE) injection 15 Units  15 Units Subcutaneous Daily Richarda Osmond, MD   15 Units at 01/19/21 0907   levETIRAcetam (KEPPRA) IVPB 500 mg/100 mL premix  500 mg Intravenous Q12H Lorna Dibble, RPH 400 mL/hr at 01/19/21 0909 500 mg at 01/19/21 0909   multivitamin (RENA-VIT) tablet 1 tablet  1 tablet Oral QHS Richarda Osmond, MD   1 tablet at 01/18/21 2109   ondansetron (ZOFRAN) injection 4 mg  4 mg Intravenous Q6H PRN Enzo Bi, MD   4 mg at 01/11/21 2154   polyethylene glycol (MIRALAX / GLYCOLAX) packet 17 g  17 g Oral BID PRN Enzo Bi, MD       rosuvastatin (CRESTOR) tablet 40 mg  40 mg Oral QPM Enzo Bi, MD   40 mg at 01/18/21 1722     Discharge Medications: Please see discharge summary for a list of discharge medications.  Relevant Imaging Results:  Relevant Lab Results:   Additional Information [id number]  Gerrianne Scale Tamisha Nordstrom, LCSW

## 2021-01-19 NOTE — Progress Notes (Signed)
PROGRESS NOTE  Sandra Massey    DOB: May 02, 1950, 70 y.o.  GYB:[id number]  PCP: [name], MD   Code Status: DNR   DOA: 01/09/2021   LOS: 10  Brief Narrative of Current Hospitalization  Sandra Massey is a 70 y.o. female with a PMH significant for Jehovah's Witness, CVA with residual right-sided hemiparesis, chronic A. fib, type II DM, HTN. They presented from SNF to the ED on 01/09/2021 with acute AMS. In the ED, it was found that they had no acute changes on head CT but abnormal electrical activity on EEG.  Neurology was consulted. They were treated with Keppra.  Additionally, patient was septic with blood cultures positive for E. coli.  She was started on IV ceftriaxone and nephrology was consulted. Patient was admitted to medicine service for further workup and management of sepsis with acute encephalopathy as outlined in detail below.  01/19/21 -stable, improved  Assessment & Plan  Principal Problem:   Acute delirium Active Problems:   ATN (acute tubular necrosis) (HCC)   Speech abnormality   Sepsis (HCC)   Cerebrovascular accident (CVA) ([place])   Altered mental status   Sepsis due to gram-negative urinary tract infection ([place])  Sepsis secondary to E. coli bacteremia with acute encephalopathy-patient continues to gradually improve alertness and more conversant today.  Again, unsure of patient's baseline.  Blood cultures positive for E. coli which was sensitive to cephalosporins.  She has not completed treatment for her infection. -Ancef (11/7-11/12), per ID - CTX (11/3-11/6) - cefepime (11/2) - flagyl 11/2 - vancomycin 11/2 - PT/OT  AKI on CKDIIIb  hypokalemia-temporary dialysis catheter placed and received HD starting 11/6.  Creatinine 4.62> 3.67>3.97 today. Baseline around 1.58. K+ 3.6 today. Continuously showing improvement. Will need clarification from her medical decision maker (sister- [phone]) as to [place] with continuing long term dialysis. -Nephrology following,  appreciate recommendations - BMP am  Anemia of chronic disease- Hgb very gradually declining throughout hospitalization likely in part due to dilution with significant IV fluids. Transfusion threshold 7. Hgb 9.2>8.7 - CBC am  A. Fib  HTN-CHA2DS2-VASc score= 6.  Rate controlled. -Continue home Eliquis -Continue home amiodarone 200 mg twice daily  Type II DM- hgb A1c on admission 6.9. blood sugars increasing as patient continues to improve her p.o. intake. - continue mSSI -15 units semglee daily  Seizure prophylaxis- no active seizure activity or repetitive motions observed -Neurology following, appreciate recommendations - continue keppra  H/o CVA  HLD-residual right-sided hemiparesis and dysphagia -Continue home rosuvastatin - psychiatry consulted-   DVT prophylaxis:   Heparin apixaban (ELIQUIS) tablet 5 mg   Diet:  Diet Orders (From admission, onward)     Start     Ordered   01/17/21 1025  Diet Carb Modified Fluid consistency: Thin; Room service appropriate? No  Diet effective now       Question Answer Comment  Diet-HS Snack? Nothing   Calorie Level Medium 1600-2000   Fluid consistency: Thin   Room service appropriate? No      01/17/21 1024            Subjective 01/19/21    Pt reports doing well today. She is looking forward to discharge from hospital. No complaints at this time.   Disposition Plan & Communication  Patient status: Inpatient  Admitted From: SNF Disposition: Skilled nursing facility Anticipated discharge date: TBD, pending return to baseline and resolution of AKI  Family Communication: none at bedside  Consults, Procedures, Significant Events  Consultants:  ID Neurology  nephrology  Procedures/significant events:  Temp cath, started HD  Antimicrobials:  Anti-infectives (From admission, onward)    Start     Dose/Rate Route Frequency Ordered Stop   01/19/21 1000  ceFAZolin (ANCEF) IVPB 1 g/50 mL premix        1 g 100 mL/hr over 30  Minutes Intravenous Every 12 hours 01/19/21 0735 01/21/21 0959   01/16/21 1800  ceFAZolin (ANCEF) IVPB 1 g/50 mL premix  Status:  Discontinued        1 g 100 mL/hr over 30 Minutes Intravenous Every 24 hours 01/15/21 1418 01/19/21 0735   01/15/21 1800  ceFAZolin (ANCEF) IVPB 2g/100 mL premix        2 g 200 mL/hr over 30 Minutes Intravenous  Once 01/15/21 1417 01/15/21 1822   01/15/21 0900  ceFAZolin (ANCEF) IVPB 1 g/50 mL premix  Status:  Discontinued        1 g 100 mL/hr over 30 Minutes Intravenous Every 24 hours 01/14/21 1502 01/15/21 1417   01/11/21 1300  vancomycin (VANCOREADY) IVPB 750 mg/150 mL  Status:  Discontinued        750 mg 150 mL/hr over 60 Minutes Intravenous Every 24 hours 01/10/21 0235 01/10/21 1116   01/11/21 0800  vancomycin (VANCOREADY) IVPB 1500 mg/300 mL  Status:  Discontinued        1,500 mg 150 mL/hr over 120 Minutes Intravenous Every 48 hours 01/10/21 1116 01/10/21 1448   01/11/21 0800  cefTRIAXone (ROCEPHIN) 2 g in sodium chloride 0.9 % 100 mL IVPB  Status:  Discontinued        2 g 200 mL/hr over 30 Minutes Intravenous Every 24 hours 01/10/21 1448 01/14/21 1502   01/10/21 1200  ceFEPIme (MAXIPIME) 2 g in sodium chloride 0.9 % 100 mL IVPB  Status:  Discontinued        2 g 200 mL/hr over 30 Minutes Intravenous Every 12 hours 01/10/21 0219 01/10/21 1448   01/09/21 2245  metroNIDAZOLE (FLAGYL) IVPB 500 mg  Status:  Discontinued        500 mg 100 mL/hr over 60 Minutes Intravenous 2 times daily 01/09/21 2228 01/10/21 1448   01/09/21 2245  vancomycin (VANCOREADY) IVPB 2000 mg/400 mL        2,000 mg 200 mL/hr over 120 Minutes Intravenous  Once 01/09/21 2234 01/10/21 0708   01/09/21 2230  ceFEPIme (MAXIPIME) 2 g in sodium chloride 0.9 % 100 mL IVPB        2 g 200 mL/hr over 30 Minutes Intravenous  Once 01/09/21 2228 01/10/21 0204   01/09/21 2230  vancomycin (VANCOCIN) IVPB 1000 mg/200 mL premix  Status:  Discontinued        1,000 mg 200 mL/hr over 60 Minutes  Intravenous  Once 01/09/21 2228 01/09/21 2234       Objective   Vitals:   01/19/21 0200 01/19/21 0300 01/19/21 0509 01/19/21 0734  BP:   135/64 109/63  Pulse:   66 65  Resp: 16 17 18    Temp:   97.9 F (36.6 C) 97.7 F (36.5 C)  TempSrc:      SpO2:   94% 91%  Weight:      Height:        Intake/Output Summary (Last 24 hours) at 01/19/2021 0748 Last data filed at 01/19/2021 0500 Gross per 24 hour  Intake 240 ml  Output 800 ml  Net -560 ml    Filed Weights   01/09/21 1352 01/16/21 0900  Weight: 136.1 kg 111.1 kg  Patient BMI: Body mass index is 38.37 kg/m.   Physical Exam: General: awake, alert, NAD HEENT: atraumatic, clear conjunctiva, anicteric sclera, moist mucus membranes, hearing grossly normal Respiratory: normal respiratory effort. Cardiovascular: normal S1/S2,  RRR, no JVD, murmurs, rubs, gallops, quick capillary refill  Gastrointestinal: soft, NT, ND, no HSM felt Nervous: Alert, oriented to self, location.  Answers questions with one-word answers which are intermittently appropriate. Extremities: non-pitting LE edema, normal tone Skin: dry, intact, normal temperature, normal color, No rashes, lesions or ulcers on exposed skin  Labs   I have personally reviewed following labs and imaging studies BMP Latest Ref Rng & Units 01/19/2021 01/18/2021 01/17/2021  Glucose 70 - 99 mg/dL 216(H) 278(H) 220(H)  BUN 8 - 23 mg/dL 20 37(H) 30(H)  Creatinine 0.44 - 1.00 mg/dL 2.79(H) 3.97(H) 3.68(H)  Sodium 135 - 145 mmol/L 136 134(L) 136  Potassium 3.5 - 5.1 mmol/L 3.6 3.3(L) 3.5  Chloride 98 - 111 mmol/L 100 99 99  CO2 22 - 32 mmol/L 28 27 26   Calcium 8.9 - 10.3 mg/dL 8.0(L) 8.0(L) 8.1(L)   CBC    Component Value Date/Time   WBC 11.7 (H) 01/19/2021 0535   RBC 3.08 (L) 01/19/2021 0535   HGB 8.7 (L) 01/19/2021 0535   HCT 27.1 (L) 01/19/2021 0535   PLT 256 01/19/2021 0535   MCV 88.0 01/19/2021 0535   MCH 28.2 01/19/2021 0535   MCHC 32.1 01/19/2021 0535   RDW 18.0  (H) 01/19/2021 0535   LYMPHSABS 1.3 01/10/2021 0009   MONOABS 1.2 (H) 01/10/2021 0009   EOSABS 0.0 01/10/2021 0009   BASOSABS 0.1 01/10/2021 0009   Imaging Studies  No results found. Medications   Scheduled Meds:  (feeding supplement) PROSource Plus  30 mL Oral TID BM   amiodarone  200 mg Oral BID   apixaban  5 mg Oral BID   Chlorhexidine Gluconate Cloth  6 each Topical Daily   feeding supplement (NEPRO CARB STEADY)  237 mL Oral BID BM   heparin sodium (porcine)  2,000 Units Intracatheter Once   insulin aspart  0-15 Units Subcutaneous TID WC   insulin glargine-yfgn  15 Units Subcutaneous Daily   multivitamin  1 tablet Oral QHS   rosuvastatin  40 mg Oral QPM   No recently discontinued medications to reconcile  LOS: 10 days   Time spent: >40min  Dois Juarbe L Catera Hankins, DO [facility] 01/19/2021, 7:48 AM   Please refer to amion to contact the [facility] Attending or Consulting provider for this pt  [email or web address] Available by Epic secure chat 7AM-7PM. If 7PM-7AM, please contact night-coverage

## 2021-01-19 NOTE — Progress Notes (Signed)
Palliative-   Multiple attempts have been made to reach a surrogate decision maker for patient. Currently, she is unable to participate in [place] discussion.   PMT will await return call from surrogate decision makers.   Mariana Kaufman, AGNP-C Palliative Medicine  No charge

## 2021-01-19 NOTE — Progress Notes (Signed)
[facility]  ROUNDING NOTE   Subjective:   Patient underwent hemodialysis treatment yesterday. Currently awake and alert but confused still. Still awaiting decision regarding goals of care from the patient's family. It appears that the patient has a sister but she is also in a nursing home.  Objective:  Vital signs in last 24 hours:  Temp:  [97.7 F (36.5 C)-97.9 F (36.6 C)] 97.7 F (36.5 C) (11/11 0734) Pulse Rate:  [40-85] 65 (11/11 0734) Resp:  [15-20] 18 (11/11 0509) BP: (99-135)/(58-81) 109/63 (11/11 0734) SpO2:  [91 %-100 %] 91 % (11/11 0734)  Weight change:  Filed Weights   01/09/21 1352 01/16/21 0900  Weight: 136.1 kg 111.1 kg    Intake/Output: I/O last 3 completed shifts: In: 340 [P.O.:240; IV Piggyback:100] Out: 800 [Urine:300; Other:500]   Intake/Output this shift:  Total I/O In: 240 [P.O.:240] Out: -   Physical Exam: General: NAD  Head: Normocephalic, atraumatic. Moist oral mucosal membranes  Eyes: Anicteric  Lungs:  Clear to auscultation, normal effort  Heart: No rub, irregular rhythm  Abdomen:  Soft, nontender  Extremities:  no peripheral edema.  Neurologic: Aphasic, alert,following simple commands  Skin: No lesions  Access Rt femoral trialysis dialysis catheter    Basic Metabolic Panel: Recent Labs  Lab 01/13/21 0607 01/14/21 0547 01/15/21 0527 01/16/21 0514 01/17/21 1456 01/18/21 0440 01/19/21 0535  NA 139 139 139 139 136 134* 136  K 5.0 5.2* 5.8* 3.6 3.5 3.3* 3.6  CL 108 106 105 100 99 99 100  CO2 16* 14* 16* 21* 26 27 28   GLUCOSE 145* 127* 99 127* 220* 278* 216*  BUN 72* 74* 57* 37* 30* 37* 20  CREATININE 4.86* 5.35* 4.62* 3.67* 3.68* 3.97* 2.79*  CALCIUM 7.3* 7.2* 6.9* 6.9* 8.1* 8.0* 8.0*  MG 2.2 2.5*  --   --   --   --   --   PHOS 5.8*  --   --   --   --   --   --      Liver Function Tests: Recent Labs  Lab 01/15/21 2254 01/18/21 0440  AST 147* 67*  ALT 147* 38  ALKPHOS 387* 351*  BILITOT 1.8* 0.8   PROT 6.0* 5.3*  ALBUMIN 2.2* 1.9*    No results for input(s): LIPASE, AMYLASE in the last 168 hours. No results for input(s): AMMONIA in the last 168 hours.  CBC: Recent Labs  Lab 01/13/21 0607 01/14/21 0547 01/17/21 1456 01/18/21 0440 01/19/21 0535  WBC 12.7* 9.7 8.8 9.7 11.7*  HGB 10.0* 9.9* 9.7* 9.2* 8.7*  HCT 31.4* 31.5* 29.9* 28.7* 27.1*  MCV 90.2 87.7 84.7 85.7 88.0  PLT 154 184 237 239 256     Cardiac Enzymes: Recent Labs  Lab 01/13/21 0607  CKTOTAL 252*     BNP: Invalid input(s): POCBNP  CBG: Recent Labs  Lab 01/17/21 2024 01/18/21 0816 01/18/21 1705 01/18/21 2207 01/19/21 0736  GLUCAP 275* 232* 151* 250* 200*     Microbiology: Results for orders placed or performed during the hospital encounter of 01/09/21  Resp Panel by RT-PCR (Flu A&B, Covid) Nasopharyngeal Swab     Status: None   Collection Time: 01/09/21  2:17 PM   Specimen: Nasopharyngeal Swab; Nasopharyngeal(NP) swabs in vial transport medium  Result Value Ref Range Status   SARS Coronavirus 2 by RT PCR NEGATIVE NEGATIVE Final    Comment: (NOTE) SARS-CoV-2 target nucleic acids are NOT DETECTED.  The SARS-CoV-2 RNA is generally detectable in upper respiratory specimens during  the acute phase of infection. The lowest concentration of SARS-CoV-2 viral copies this assay can detect is 138 copies/mL. A negative result does not preclude SARS-Cov-2 infection and should not be used as the sole basis for treatment or other patient management decisions. A negative result may occur with  improper specimen collection/handling, submission of specimen other than nasopharyngeal swab, presence of viral mutation(s) within the areas targeted by this assay, and inadequate number of viral copies(<138 copies/mL). A negative result must be combined with clinical observations, patient history, and epidemiological information. The expected result is Negative.  Fact Sheet for Patients:   EntrepreneurPulse.com.au  Fact Sheet for Healthcare Providers:  IncredibleEmployment.be  This test is no t yet approved or cleared by the [place] FDA and  has been authorized for detection and/or diagnosis of SARS-CoV-2 by FDA under an Emergency Use Authorization (EUA). This EUA will remain  in effect (meaning this test can be used) for the duration of the COVID-19 declaration under Section 564(b)(1) of the Act, 21 U.S.C.section 360bbb-3(b)(1), unless the authorization is terminated  or revoked sooner.       Influenza A by PCR NEGATIVE NEGATIVE Final   Influenza B by PCR NEGATIVE NEGATIVE Final    Comment: (NOTE) The Xpert Xpress SARS-CoV-2/FLU/RSV plus assay is intended as an aid in the diagnosis of influenza from Nasopharyngeal swab specimens and should not be used as a sole basis for treatment. Nasal washings and aspirates are unacceptable for Xpert Xpress SARS-CoV-2/FLU/RSV testing.  Fact Sheet for Patients: EntrepreneurPulse.com.au  Fact Sheet for Healthcare Providers: IncredibleEmployment.be  This test is not yet approved or cleared by the [place] FDA and has been authorized for detection and/or diagnosis of SARS-CoV-2 by FDA under an Emergency Use Authorization (EUA). This EUA will remain in effect (meaning this test can be used) for the duration of the COVID-19 declaration under Section 564(b)(1) of the Act, 21 U.S.C. section 360bbb-3(b)(1), unless the authorization is terminated or revoked.  Performed at [facility], [address] [place], [place] [address]   Culture, blood (Routine X 2) w Reflex to ID Panel     Status: Abnormal   Collection Time: 01/10/21 12:09 AM   Specimen: BLOOD  Result Value Ref Range Status   Specimen Description   Final    BLOOD RIGHT FOREARM Performed at [facility], [address], [place], [place] [address]    Special Requests    Final    BOTTLES DRAWN AEROBIC AND ANAEROBIC Blood Culture results may not be optimal due to an excessive volume of blood received in culture bottles Performed at [facility], [address], [place], [place] [address]    Culture  Setup Time   Final    GRAM NEGATIVE RODS IN BOTH AEROBIC AND ANAEROBIC BOTTLES CRITICAL RESULT CALLED TO, READ BACK BY AND VERIFIED WITH: PHARMD S HALLHAI [id number] AT 815 AM BY CM    Culture (A)  Final    ESCHERICHIA COLI SUSCEPTIBILITIES PERFORMED ON PREVIOUS CULTURE WITHIN THE LAST 5 DAYS. Performed at [facility], [place] [address] [place], [place] [address]    Report Status 01/13/2021 FINAL  Final  Culture, blood (Routine X 2) w Reflex to ID Panel     Status: Abnormal   Collection Time: 01/10/21 12:09 AM   Specimen: BLOOD  Result Value Ref Range Status   Specimen Description   Final    BLOOD LEFT ANTECUBITAL Performed at [facility], [place] [address], [place], [place] [address]  Special Requests   Final    BOTTLES DRAWN AEROBIC AND ANAEROBIC Blood Culture adequate volume Performed at [facility], [address] [place], [place] [address]    Culture  Setup Time   Final    GRAM NEGATIVE RODS IN BOTH AEROBIC AND ANAEROBIC BOTTLES CRITICAL RESULT CALLED TO, READ BACK BY AND VERIFIED WITH: [place], PHARMD AT 1350 ON 01/10/21 BY GM Performed at [facility], [place] [address], [place], [place] [address]    Culture ESCHERICHIA COLI (A)  Final   Report Status 01/13/2021 FINAL  Final   Organism ID, Bacteria ESCHERICHIA COLI  Final      Susceptibility   Escherichia coli - MIC*    AMPICILLIN >=32 RESISTANT Resistant     CEFAZOLIN <=4 SENSITIVE Sensitive     CEFEPIME <=0.12 SENSITIVE Sensitive     CEFTAZIDIME <=1 SENSITIVE Sensitive     CEFTRIAXONE <=0.25 SENSITIVE Sensitive     CIPROFLOXACIN <=0.25 SENSITIVE Sensitive     GENTAMICIN 8 INTERMEDIATE Intermediate     IMIPENEM <=0.25 SENSITIVE Sensitive      TRIMETH/SULFA >=320 RESISTANT Resistant     AMPICILLIN/SULBACTAM >=32 RESISTANT Resistant     PIP/TAZO <=4 SENSITIVE Sensitive     * ESCHERICHIA COLI  Blood Culture ID Panel (Reflexed)     Status: Abnormal   Collection Time: 01/10/21 12:09 AM  Result Value Ref Range Status   Enterococcus faecalis NOT DETECTED NOT DETECTED Final   Enterococcus Faecium NOT DETECTED NOT DETECTED Final   Listeria monocytogenes NOT DETECTED NOT DETECTED Final   Staphylococcus species NOT DETECTED NOT DETECTED Final   Staphylococcus aureus (BCID) NOT DETECTED NOT DETECTED Final   Staphylococcus epidermidis NOT DETECTED NOT DETECTED Final   Staphylococcus lugdunensis NOT DETECTED NOT DETECTED Final   Streptococcus species NOT DETECTED NOT DETECTED Final   Streptococcus agalactiae NOT DETECTED NOT DETECTED Final   Streptococcus pneumoniae NOT DETECTED NOT DETECTED Final   Streptococcus pyogenes NOT DETECTED NOT DETECTED Final   A.calcoaceticus-baumannii NOT DETECTED NOT DETECTED Final   Bacteroides fragilis NOT DETECTED NOT DETECTED Final   Enterobacterales DETECTED (A) NOT DETECTED Final    Comment: Enterobacterales represent a large order of gram negative bacteria, not a single organism. CRITICAL RESULT CALLED TO, READ BACK BY AND VERIFIED WITH: [place], PHARMD AT 1350 ON 01/10/21 BY GM    Enterobacter cloacae complex NOT DETECTED NOT DETECTED Final   Escherichia coli DETECTED (A) NOT DETECTED Final    Comment: CRITICAL RESULT CALLED TO, READ BACK BY AND VERIFIED WITH: [place], PHARMD AT 1350 ON 01/10/21 BY GM    Klebsiella aerogenes NOT DETECTED NOT DETECTED Final   Klebsiella oxytoca NOT DETECTED NOT DETECTED Final   Klebsiella pneumoniae NOT DETECTED NOT DETECTED Final   Proteus species NOT DETECTED NOT DETECTED Final   Salmonella species NOT DETECTED NOT DETECTED Final   Serratia marcescens NOT DETECTED NOT DETECTED Final   Haemophilus influenzae NOT DETECTED NOT DETECTED Final    Neisseria meningitidis NOT DETECTED NOT DETECTED Final   Pseudomonas aeruginosa NOT DETECTED NOT DETECTED Final   Stenotrophomonas maltophilia NOT DETECTED NOT DETECTED Final   Candida albicans NOT DETECTED NOT DETECTED Final   Candida auris NOT DETECTED NOT DETECTED Final   Candida glabrata NOT DETECTED NOT DETECTED Final   Candida krusei NOT DETECTED NOT DETECTED Final   Candida parapsilosis NOT DETECTED NOT DETECTED Final   Candida tropicalis NOT DETECTED NOT DETECTED Final   Cryptococcus neoformans/gattii NOT DETECTED NOT DETECTED Final  CTX-M ESBL NOT DETECTED NOT DETECTED Final   Carbapenem resistance IMP NOT DETECTED NOT DETECTED Final   Carbapenem resistance KPC NOT DETECTED NOT DETECTED Final   Carbapenem resistance NDM NOT DETECTED NOT DETECTED Final   Carbapenem resist OXA 48 LIKE NOT DETECTED NOT DETECTED Final   Carbapenem resistance VIM NOT DETECTED NOT DETECTED Final    Comment: Performed at [facility], [address], [place], [place] [address]  Urine Culture     Status: Abnormal   Collection Time: 01/10/21  1:05 PM   Specimen: Urine, Random  Result Value Ref Range Status   Specimen Description   Final    URINE, RANDOM Performed at [facility], [address], [place], [place] [address]    Special Requests   Final    NONE Performed at [facility], [address] [place], [place] [address]    Culture MULTIPLE SPECIES PRESENT, SUGGEST RECOLLECTION (A)  Final   Report Status 01/11/2021 FINAL  Final    Coagulation Studies: No results for input(s): LABPROT, INR in the last 72 hours.   Urinalysis: No results for input(s): COLORURINE, LABSPEC, PHURINE, GLUCOSEU, HGBUR, BILIRUBINUR, KETONESUR, PROTEINUR, UROBILINOGEN, NITRITE, LEUKOCYTESUR in the last 72 hours.  Invalid input(s): APPERANCEUR     Imaging: No results found.   Medications:    sodium chloride 10 mL/hr at 01/17/21 0427    ceFAZolin (ANCEF) IV 1 g (01/19/21  1035)   levETIRAcetam 500 mg (01/19/21 0909)    (feeding supplement) PROSource Plus  30 mL Oral TID BM   amiodarone  200 mg Oral BID   apixaban  5 mg Oral BID   Chlorhexidine Gluconate Cloth  6 each Topical Daily   feeding supplement (NEPRO CARB STEADY)  237 mL Oral BID BM   heparin sodium (porcine)  2,000 Units Intracatheter Once   insulin aspart  0-15 Units Subcutaneous TID WC   insulin glargine-yfgn  15 Units Subcutaneous Daily   multivitamin  1 tablet Oral QHS   rosuvastatin  40 mg Oral QPM   sodium chloride, acetaminophen (TYLENOL) oral liquid 160 mg/5 mL, ALPRAZolam, haloperidol lactate, ondansetron (ZOFRAN) IV, polyethylene glycol  Assessment/ Plan:  [name] is a 70 y.o.  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  Acute Kidney Injury with hyperkalemia on chronic kidney disease stage 3B with baseline creatinine 1.58 and GFR of 35 on 06/26/20.  Acute kidney injury secondary to sepsis Urinalysis on January 09, 2021: Turbid sample, greater than 50 RBCs, greater than 50 WBCs.  Urine culture negative Renal ultrasound shows Right kidney 11.5 cm, volume 222 mL, diffuse increased echogenicity.  Left kidney 11.1 cm, volume 172 mL, small left renal cyst. Dialysis initiated 01/14/21.   Patient underwent hemodialysis treatment yesterday.  No acute indication for dialysis today.  We are still awaiting decision regarding long-term decision from a member of the patient's family.  It appears that she has a family member who herself is in a nursing home however.  We may need additional assistance from social work.  We will plan for dialysis again on Saturday.  Overall urine output remains low at 300 cc over the past 24 hours.  Lab Results  Component Value Date   CREATININE 2.79 (H) 01/19/2021   CREATININE 3.97 (H)  01/18/2021   CREATININE 3.68 (H) 01/17/2021    Intake/Output Summary (Last  24 hours) at 01/19/2021 1107 Last data filed at 01/19/2021 0940 Gross per 24 hour  Intake 480 ml  Output 800 ml  Net -320 ml    2. Sepsis from E. Coli bacteremia Blood cultures positive on 01/10/21 for Enterobacterials, E. Coli Receiving cefazolin with ID following  3.  Anemia of chronic kidney disease.  Hemoglobin 8.7.  Consider Epogen next week once she has a more permanent dialysis schedule.   LOS: 10 Santana Gosdin 11/11/202211:07 AM

## 2021-01-20 DIAGNOSIS — R471 Dysarthria and anarthria: Secondary | ICD-10-CM

## 2021-01-20 DIAGNOSIS — A4150 Gram-negative sepsis, unspecified: Secondary | ICD-10-CM | POA: Diagnosis not present

## 2021-01-20 DIAGNOSIS — N390 Urinary tract infection, site not specified: Secondary | ICD-10-CM | POA: Diagnosis not present

## 2021-01-20 DIAGNOSIS — N170 Acute kidney failure with tubular necrosis: Secondary | ICD-10-CM | POA: Diagnosis not present

## 2021-01-20 LAB — BASIC METABOLIC PANEL
Anion gap: 8 (ref 5–15)
BUN: 31 mg/dL — ABNORMAL HIGH (ref 8–23)
CO2: 28 mmol/L (ref 22–32)
Calcium: 8.5 mg/dL — ABNORMAL LOW (ref 8.9–10.3)
Chloride: 100 mmol/L (ref 98–111)
Creatinine, Ser: 3.81 mg/dL — ABNORMAL HIGH (ref 0.44–1.00)
GFR, Estimated: 12 mL/min — ABNORMAL LOW (ref >60)
Glucose, Bld: 208 mg/dL — ABNORMAL HIGH (ref 70–99)
Potassium: 3.7 mmol/L (ref 3.5–5.1)
Sodium: 136 mmol/L (ref 135–145)

## 2021-01-20 LAB — CBC
HCT: 27.9 % — ABNORMAL LOW (ref 36.0–46.0)
Hemoglobin: 8.8 g/dL — ABNORMAL LOW (ref 12.0–15.0)
MCH: 28.2 pg (ref 26.0–34.0)
MCHC: 31.5 g/dL (ref 30.0–36.0)
MCV: 89.4 fL (ref 80.0–100.0)
Platelets: 278 10*3/uL (ref 150–400)
RBC: 3.12 MIL/uL — ABNORMAL LOW (ref 3.87–5.11)
RDW: 18.4 % — ABNORMAL HIGH (ref 11.5–15.5)
WBC: 15.1 10*3/uL — ABNORMAL HIGH (ref 4.0–10.5)
nRBC: 0 % (ref 0.0–0.2)

## 2021-01-20 LAB — GLUCOSE, CAPILLARY
Glucose-Capillary: 201 mg/dL — ABNORMAL HIGH (ref 70–99)
Glucose-Capillary: 141 mg/dL — ABNORMAL HIGH (ref 70–99)
Glucose-Capillary: 172 mg/dL — ABNORMAL HIGH (ref 70–99)
Glucose-Capillary: 214 mg/dL — ABNORMAL HIGH (ref 70–99)

## 2021-01-20 MED ORDER — HEPARIN SODIUM (PORCINE) 1000 UNIT/ML IJ SOLN
INTRAMUSCULAR | Status: AC
  Filled 2021-01-20: qty 1

## 2021-01-20 MED ORDER — QUETIAPINE FUMARATE 25 MG PO TABS
25.0000 mg | ORAL_TABLET | Freq: Once | ORAL | Status: AC
  Administered 2021-01-20: 25 mg via ORAL
  Filled 2021-01-20: qty 1

## 2021-01-20 NOTE — Progress Notes (Signed)
OT Cancellation Note  Patient Details Name: DELAINE HERNANDEZ MRN: [id number] DOB: [birth year]   Cancelled Treatment:    Reason Eval/Treat Not Completed: Medical issues which prohibited therapy. Attempted to see pt. following dialysis this afternoon, however upon arrival pt. appeared agitated, yelling "help me, please!" Attempted to assist pt. with linens, and repositioning, however pt. continued to perseverate on yelling "help me, please" "Help me" "Please, Please" Pt. nurse was notified. Will attempt the OT initial eval on the next available date.  Harrel Carina, MS, OTR/L 01/20/2021, 3:13 PM

## 2021-01-20 NOTE — Progress Notes (Addendum)
PROGRESS NOTE  [name]    DOB: [birth year], 70 y.o.  FFM:[id number]  PCP: [name], MD   Code Status: DNR   DOA: 01/09/2021   LOS: 11  Brief Narrative of Current Hospitalization  Sandra Massey is a 70 y.o. female with a PMH significant for Jehovah's Witness, CVA with residual right-sided hemiparesis, chronic A. fib, type II DM, HTN. They presented from SNF to the ED on 01/09/2021 with acute AMS. In the ED, it was found that they had no acute changes on head CT but abnormal electrical activity on EEG.  Neurology was consulted. They were treated with Keppra.  Additionally, patient was septic with blood cultures positive for E. coli.  She was started on IV ceftriaxone and nephrology was consulted. Patient was admitted to medicine service for further workup and management of sepsis with acute encephalopathy as outlined in detail below.  01/20/21 -stable, improved  Assessment & Plan  Principal Problem:   Acute delirium Active Problems:   ATN (acute tubular necrosis) (HCC)   Speech abnormality   Sepsis (HCC)   Cerebrovascular accident (CVA) ([place])   Altered mental status   Sepsis due to gram-negative urinary tract infection ([place])  Sepsis secondary to E. coli bacteremia with acute encephalopathy-patient continues to gradually improve alertness and more conversant today.  Again, unsure of patient's baseline.  Blood cultures positive for E. coli which was sensitive to cephalosporins.  She has completed treatment for her infection today. Leukocytosis 11.7>15.1 today. Afebrile and asymptomatic. Will continue to monitor -Ancef (11/7-11/12), per ID - CTX (11/3-11/6) - cefepime (11/2) - flagyl 11/2 - vancomycin 11/2 - PT/OT - CBC am  AKI on CKDIIIb  hypokalemia-temporary dialysis catheter placed and received HD starting 11/6. TThS schedule. Creatinine 4.62> 3.67>3.97 today. Baseline around 1.58. K+ 3.7 today. Will need clarification from her medical decision maker (sister-  [phone]) as to [facility] with continuing long term dialysis. If patient is to continue on long-term HD, she will need a more permanent access inserted and set up outpatient HD. She could at that point be discharged to SNF. Patient still lacks capacity for decision making.  -Nephrology following, appreciate recommendations - appreciate the help of palliative and TOC in attempts to contact family for medical decision making decisions.  - RFP on HD days  Anemia of chronic disease- Patient is Jehova's witness. Hgb very gradually declining throughout hospitalization likely in part due to dilution with significant IV fluids. Hgb 9.2>8.7>8.8 - CBC on HD days  A. Fib  HTN-CHA2DS2-VASc score= 6.  Rate controlled. -Continue home Eliquis -Continue home amiodarone 200 mg twice daily  Type II DM- hgb A1c on admission 6.9. blood sugars increasing as patient continues to improve her p.o. intake. - continue mSSI -15 units semglee daily  Seizure prophylaxis- no active seizure activity or repetitive motions observed -Neurology following, appreciate recommendations - continue keppra  H/o CVA  HLD-residual right-sided hemiparesis and dysphagia -Continue home rosuvastatin - psychiatry consulted-   DVT prophylaxis:   Heparin apixaban (ELIQUIS) tablet 5 mg   Diet:  Diet Orders (From admission, onward)     Start     Ordered   01/17/21 1025  Diet Carb Modified Fluid consistency: Thin; Room service appropriate? No  Diet effective now       Question Answer Comment  Diet-HS Snack? Nothing   Calorie Level Medium 1600-2000   Fluid consistency: Thin   Room service appropriate? No      01/17/21 1024  Subjective 01/20/21    Pt reports no complaints today. She states that HD has been going good. She is unsure of other means of getting ahold of her sister.  Disposition Plan & Communication  Patient status: Inpatient  Admitted From: SNF Disposition: Skilled nursing facility Anticipated  discharge date: TBD, pending return to baseline and resolution of AKI  Family Communication: none at bedside  Consults, Procedures, Significant Events  Consultants:  ID Neurology Nephrology  Psychiatry   Procedures/significant events:  Temp cath, started HD 11/6  Antimicrobials:  Anti-infectives (From admission, onward)    Start     Dose/Rate Route Frequency Ordered Stop   01/19/21 1000  ceFAZolin (ANCEF) IVPB 1 g/50 mL premix        1 g 100 mL/hr over 30 Minutes Intravenous Every 12 hours 01/19/21 0735 01/21/21 0959   01/16/21 1800  ceFAZolin (ANCEF) IVPB 1 g/50 mL premix  Status:  Discontinued        1 g 100 mL/hr over 30 Minutes Intravenous Every 24 hours 01/15/21 1418 01/19/21 0735   01/15/21 1800  ceFAZolin (ANCEF) IVPB 2g/100 mL premix        2 g 200 mL/hr over 30 Minutes Intravenous  Once 01/15/21 1417 01/15/21 1822   01/15/21 0900  ceFAZolin (ANCEF) IVPB 1 g/50 mL premix  Status:  Discontinued        1 g 100 mL/hr over 30 Minutes Intravenous Every 24 hours 01/14/21 1502 01/15/21 1417   01/11/21 1300  vancomycin (VANCOREADY) IVPB 750 mg/150 mL  Status:  Discontinued        750 mg 150 mL/hr over 60 Minutes Intravenous Every 24 hours 01/10/21 0235 01/10/21 1116   01/11/21 0800  vancomycin (VANCOREADY) IVPB 1500 mg/300 mL  Status:  Discontinued        1,500 mg 150 mL/hr over 120 Minutes Intravenous Every 48 hours 01/10/21 1116 01/10/21 1448   01/11/21 0800  cefTRIAXone (ROCEPHIN) 2 g in sodium chloride 0.9 % 100 mL IVPB  Status:  Discontinued        2 g 200 mL/hr over 30 Minutes Intravenous Every 24 hours 01/10/21 1448 01/14/21 1502   01/10/21 1200  ceFEPIme (MAXIPIME) 2 g in sodium chloride 0.9 % 100 mL IVPB  Status:  Discontinued        2 g 200 mL/hr over 30 Minutes Intravenous Every 12 hours 01/10/21 0219 01/10/21 1448   01/09/21 2245  metroNIDAZOLE (FLAGYL) IVPB 500 mg  Status:  Discontinued        500 mg 100 mL/hr over 60 Minutes Intravenous 2 times daily 01/09/21  2228 01/10/21 1448   01/09/21 2245  vancomycin (VANCOREADY) IVPB 2000 mg/400 mL        2,000 mg 200 mL/hr over 120 Minutes Intravenous  Once 01/09/21 2234 01/10/21 0708   01/09/21 2230  ceFEPIme (MAXIPIME) 2 g in sodium chloride 0.9 % 100 mL IVPB        2 g 200 mL/hr over 30 Minutes Intravenous  Once 01/09/21 2228 01/10/21 0204   01/09/21 2230  vancomycin (VANCOCIN) IVPB 1000 mg/200 mL premix  Status:  Discontinued        1,000 mg 200 mL/hr over 60 Minutes Intravenous  Once 01/09/21 2228 01/09/21 2234       Objective   Vitals:   01/20/21 0049 01/20/21 0328 01/20/21 0400 01/20/21 0451  BP: 125/85 130/77    Pulse: 72 (!) 56    Resp: 16 18 17    Temp: 97.9 F (36.6 C)  98.5 F (36.9 C)    TempSrc:      SpO2: 93% 92%    Weight:    116.6 kg  Height:        Intake/Output Summary (Last 24 hours) at 01/20/2021 1610 Last data filed at 01/20/2021 0400 Gross per 24 hour  Intake 607.93 ml  Output --  Net 607.93 ml    Filed Weights   01/09/21 1352 01/16/21 0900 01/20/21 0451  Weight: 136.1 kg 111.1 kg 116.6 kg    Patient BMI: Body mass index is 40.25 kg/m.   Physical Exam: General: awake, alert, NAD HEENT: atraumatic, clear conjunctiva, anicteric sclera, moist mucus membranes, hearing grossly normal Respiratory: normal respiratory effort. Cardiovascular: normal S1/S2,  RRR, no JVD, murmurs, rubs, gallops, quick capillary refill  Gastrointestinal: soft, NT, ND, no HSM felt Nervous: Alert, oriented to self, location.  Answers questions with simple answers but unable to express understanding of her condition or consequences of therapy/withholding therapy Extremities: non-pitting LE edema, normal tone Skin: dry, intact, normal temperature, normal color, No rashes, lesions or ulcers on exposed skin  Labs   I have personally reviewed following labs and imaging studies BMP Latest Ref Rng & Units 01/19/2021 01/18/2021 01/17/2021  Glucose 70 - 99 mg/dL 216(H) 278(H) 220(H)  BUN 8 - 23  mg/dL 20 37(H) 30(H)  Creatinine 0.44 - 1.00 mg/dL 2.79(H) 3.97(H) 3.68(H)  Sodium 135 - 145 mmol/L 136 134(L) 136  Potassium 3.5 - 5.1 mmol/L 3.6 3.3(L) 3.5  Chloride 98 - 111 mmol/L 100 99 99  CO2 22 - 32 mmol/L 28 27 26   Calcium 8.9 - 10.3 mg/dL 8.0(L) 8.0(L) 8.1(L)   CBC    Component Value Date/Time   WBC 15.1 (H) 01/20/2021 0622   RBC 3.12 (L) 01/20/2021 0622   HGB 8.8 (L) 01/20/2021 0622   HCT 27.9 (L) 01/20/2021 0622   PLT 278 01/20/2021 0622   MCV 89.4 01/20/2021 0622   MCH 28.2 01/20/2021 0622   MCHC 31.5 01/20/2021 0622   RDW 18.4 (H) 01/20/2021 0622   LYMPHSABS 1.3 01/10/2021 0009   MONOABS 1.2 (H) 01/10/2021 0009   EOSABS 0.0 01/10/2021 0009   BASOSABS 0.1 01/10/2021 0009   Imaging Studies  No results found. Medications   Scheduled Meds:  (feeding supplement) PROSource Plus  30 mL Oral TID BM   amiodarone  200 mg Oral BID   apixaban  5 mg Oral BID   Chlorhexidine Gluconate Cloth  6 each Topical Daily   feeding supplement (NEPRO CARB STEADY)  237 mL Oral BID BM   heparin sodium (porcine)  2,000 Units Intracatheter Once   insulin aspart  0-15 Units Subcutaneous TID WC   insulin glargine-yfgn  15 Units Subcutaneous Daily   multivitamin  1 tablet Oral QHS   rosuvastatin  40 mg Oral QPM   No recently discontinued medications to reconcile  LOS: 11 days   Time spent: >32min  Darnetta Kesselman L Desirre Eickhoff, DO [facility] 01/20/2021, 7:12 AM   Please refer to amion to contact the [facility] Attending or Consulting provider for this pt  [email or web address] Available by Epic secure chat 7AM-7PM. If 7PM-7AM, please contact night-coverage

## 2021-01-20 NOTE — Progress Notes (Signed)
PT Cancellation Note  Patient Details Name: Sandra Massey MRN: [id number] DOB: 1950-11-28   Cancelled Treatment:    Reason Eval/Treat Not Completed: Patient at procedure or test/unavailable  PT attempted evaluation at 11AM and 1PM and patient at dialysis both times. Will re-attempt PT evaluation when patient is back in room and ready/willing to participate.  Thank you for this referral.   Aizen Duval PT, DPT 01/20/2021, 1:31 PM

## 2021-01-20 NOTE — Progress Notes (Signed)
[facility]  Dialysis Note   Subjective:   Seen and examined on hemodialysis treatment.  She is being dialyzed via temporary dialysis catheter. She complains of some shortness of breath.   HEMODIALYSIS FLOWSHEET:  Blood Flow Rate (mL/min): 400 mL/min Arterial Pressure (mmHg): -160 mmHg Venous Pressure (mmHg): 160 mmHg Transmembrane Pressure (mmHg): 50 mmHg Ultrafiltration Rate (mL/min): 330 mL/min Dialysate Flow Rate (mL/min): 500 ml/min Conductivity: Machine : 13.9 Conductivity: Machine : 13.9 Dialysis Fluid Bolus: Normal Saline Bolus Amount (mL): 250 mL    Objective:  Vital signs in last 24 hours:  Temp:  [97.9 F (36.6 C)-99.3 F (37.4 C)] 98.1 F (36.7 C) (11/12 0821) Pulse Rate:  [56-90] 90 (11/12 1230) Resp:  [16-27] 22 (11/12 1230) BP: (68-135)/(49-97) 129/76 (11/12 1230) SpO2:  [90 %-100 %] 99 % (11/12 1130) Weight:  [116.6 kg] 116.6 kg (11/12 0451)  Weight change:  Filed Weights   01/09/21 1352 01/16/21 0900 01/20/21 0451  Weight: 136.1 kg 111.1 kg 116.6 kg    Intake/Output: I/O last 3 completed shifts: In: 607.9 [P.O.:240; I.V.:67.9; IV Piggyback:300] Out: 300 [Urine:300]   Intake/Output this shift:  No intake/output data recorded.  Physical Exam: General: NAD,   Head: Normocephalic, atraumatic. Moist oral mucosal membranes  Eyes: Anicteric, PERRL  Neck: Supple, trachea midline  Lungs:  Clear to auscultation  Heart: Regular rate and rhythm  Abdomen:  Soft, nontender,   Extremities:  peripheral edema.  Neurologic: Nonfocal, moving all four extremities  Skin: No lesions  Access:     Basic Metabolic Panel: Recent Labs  Lab 01/14/21 0547 01/15/21 0527 01/16/21 0514 01/17/21 1456 01/18/21 0440 01/19/21 0535 01/20/21 0622  NA 139   < > 139 136 134* 136 136  K 5.2*   < > 3.6 3.5 3.3* 3.6 3.7  CL 106   < > 100 99 99 100 100  CO2 14*   < > 21* 26 27 28 28   GLUCOSE 127*   < > 127* 220* 278* 216* 208*  BUN 74*   < > 37* 30*  37* 20 31*  CREATININE 5.35*   < > 3.67* 3.68* 3.97* 2.79* 3.81*  CALCIUM 7.2*   < > 6.9* 8.1* 8.0* 8.0* 8.5*  MG 2.5*  --   --   --   --   --   --    < > = values in this interval not displayed.    Liver Function Tests: Recent Labs  Lab 01/15/21 2254 01/18/21 0440  AST 147* 67*  ALT 147* 38  ALKPHOS 387* 351*  BILITOT 1.8* 0.8  PROT 6.0* 5.3*  ALBUMIN 2.2* 1.9*   No results for input(s): LIPASE, AMYLASE in the last 168 hours. No results for input(s): AMMONIA in the last 168 hours.  CBC: Recent Labs  Lab 01/14/21 0547 01/17/21 1456 01/18/21 0440 01/19/21 0535 01/20/21 0622  WBC 9.7 8.8 9.7 11.7* 15.1*  HGB 9.9* 9.7* 9.2* 8.7* 8.8*  HCT 31.5* 29.9* 28.7* 27.1* 27.9*  MCV 87.7 84.7 85.7 88.0 89.4  PLT 184 237 239 256 278    Cardiac Enzymes: No results for input(s): CKTOTAL, CKMB, CKMBINDEX, TROPONINI in the last 168 hours.  BNP: Invalid input(s): POCBNP  CBG: Recent Labs  Lab 01/19/21 0736 01/19/21 1121 01/19/21 1629 01/19/21 2148 01/20/21 0822  GLUCAP 200* 228* 245* 188* 201*    Microbiology: Results for orders placed or performed during the hospital encounter of 01/09/21  Resp Panel by RT-PCR (Flu A&B, Covid) Nasopharyngeal Swab  Status: None   Collection Time: 01/09/21  2:17 PM   Specimen: Nasopharyngeal Swab; Nasopharyngeal(NP) swabs in vial transport medium  Result Value Ref Range Status   SARS Coronavirus 2 by RT PCR NEGATIVE NEGATIVE Final    Comment: (NOTE) SARS-CoV-2 target nucleic acids are NOT DETECTED.  The SARS-CoV-2 RNA is generally detectable in upper respiratory specimens during the acute phase of infection. The lowest concentration of SARS-CoV-2 viral copies this assay can detect is 138 copies/mL. A negative result does not preclude SARS-Cov-2 infection and should not be used as the sole basis for treatment or other patient management decisions. A negative result may occur with  improper specimen collection/handling, submission  of specimen other than nasopharyngeal swab, presence of viral mutation(s) within the areas targeted by this assay, and inadequate number of viral copies(<138 copies/mL). A negative result must be combined with clinical observations, patient history, and epidemiological information. The expected result is Negative.  Fact Sheet for Patients:  EntrepreneurPulse.com.au  Fact Sheet for Healthcare Providers:  IncredibleEmployment.be  This test is no t yet approved or cleared by the [place] FDA and  has been authorized for detection and/or diagnosis of SARS-CoV-2 by FDA under an Emergency Use Authorization (EUA). This EUA will remain  in effect (meaning this test can be used) for the duration of the COVID-19 declaration under Section 564(b)(1) of the Act, 21 U.S.C.section 360bbb-3(b)(1), unless the authorization is terminated  or revoked sooner.       Influenza A by PCR NEGATIVE NEGATIVE Final   Influenza B by PCR NEGATIVE NEGATIVE Final    Comment: (NOTE) The Xpert Xpress SARS-CoV-2/FLU/RSV plus assay is intended as an aid in the diagnosis of influenza from Nasopharyngeal swab specimens and should not be used as a sole basis for treatment. Nasal washings and aspirates are unacceptable for Xpert Xpress SARS-CoV-2/FLU/RSV testing.  Fact Sheet for Patients: EntrepreneurPulse.com.au  Fact Sheet for Healthcare Providers: IncredibleEmployment.be  This test is not yet approved or cleared by the [place] FDA and has been authorized for detection and/or diagnosis of SARS-CoV-2 by FDA under an Emergency Use Authorization (EUA). This EUA will remain in effect (meaning this test can be used) for the duration of the COVID-19 declaration under Section 564(b)(1) of the Act, 21 U.S.C. section 360bbb-3(b)(1), unless the authorization is terminated or revoked.  Performed at [facility], [address] [place], [place] [address]   Culture, blood (Routine X 2) w Reflex to ID Panel     Status: Abnormal   Collection Time: 01/10/21 12:09 AM   Specimen: BLOOD  Result Value Ref Range Status   Specimen Description   Final    BLOOD RIGHT FOREARM Performed at [facility], [address], [place], [place] [address]    Special Requests   Final    BOTTLES DRAWN AEROBIC AND ANAEROBIC Blood Culture results may not be optimal due to an excessive volume of blood received in culture bottles Performed at [facility], [address], [place], [place] [address]    Culture  Setup Time   Final    GRAM NEGATIVE RODS IN BOTH AEROBIC AND ANAEROBIC BOTTLES CRITICAL RESULT CALLED TO, READ BACK BY AND VERIFIED WITH: PHARMD S HALLHAI [id number] AT 815 AM BY CM    Culture (A)  Final    ESCHERICHIA COLI SUSCEPTIBILITIES PERFORMED ON PREVIOUS CULTURE WITHIN THE LAST 5 DAYS. Performed at [facility], [place] [address] [place], [place] [address]    Report Status 01/13/2021 FINAL  Final  Culture, blood (Routine X 2) w Reflex to ID Panel     Status: Abnormal   Collection Time: 01/10/21 12:09 AM   Specimen: BLOOD  Result Value Ref Range Status   Specimen Description   Final    BLOOD LEFT ANTECUBITAL Performed at [facility], [place] [address], [place], [place] [address]    Special Requests   Final    BOTTLES DRAWN AEROBIC AND ANAEROBIC Blood Culture adequate volume Performed at [address] [place], [place] [address]    Culture  Setup Time   Final    GRAM NEGATIVE RODS IN BOTH AEROBIC AND ANAEROBIC BOTTLES CRITICAL RESULT CALLED TO, READ BACK BY AND VERIFIED WITH: RAQUEL GOODMAN, PHARMD AT 1350 ON 01/10/21 BY GM Performed at [facility], [place] [address], [place], [place] [address]    Culture ESCHERICHIA COLI (A)  Final   Report Status 01/13/2021 FINAL  Final   Organism ID, Bacteria ESCHERICHIA COLI  Final      Susceptibility   Escherichia coli -  MIC*    AMPICILLIN >=32 RESISTANT Resistant     CEFAZOLIN <=4 SENSITIVE Sensitive     CEFEPIME <=0.12 SENSITIVE Sensitive     CEFTAZIDIME <=1 SENSITIVE Sensitive     CEFTRIAXONE <=0.25 SENSITIVE Sensitive     CIPROFLOXACIN <=0.25 SENSITIVE Sensitive     GENTAMICIN 8 INTERMEDIATE Intermediate     IMIPENEM <=0.25 SENSITIVE Sensitive     TRIMETH/SULFA >=320 RESISTANT Resistant     AMPICILLIN/SULBACTAM >=32 RESISTANT Resistant     PIP/TAZO <=4 SENSITIVE Sensitive     * ESCHERICHIA COLI  Blood Culture ID Panel (Reflexed)     Status: Abnormal   Collection Time: 01/10/21 12:09 AM  Result Value Ref Range Status   Enterococcus faecalis NOT DETECTED NOT DETECTED Final   Enterococcus Faecium NOT DETECTED NOT DETECTED Final   Listeria monocytogenes NOT DETECTED NOT DETECTED Final   Staphylococcus species NOT DETECTED NOT DETECTED Final   Staphylococcus aureus (BCID) NOT DETECTED NOT DETECTED Final   Staphylococcus epidermidis NOT DETECTED NOT DETECTED Final   Staphylococcus lugdunensis NOT DETECTED NOT DETECTED Final   Streptococcus species NOT DETECTED NOT DETECTED Final   Streptococcus agalactiae NOT DETECTED NOT DETECTED Final   Streptococcus pneumoniae NOT DETECTED NOT DETECTED Final   Streptococcus pyogenes NOT DETECTED NOT DETECTED Final   A.calcoaceticus-baumannii NOT DETECTED NOT DETECTED Final   Bacteroides fragilis NOT DETECTED NOT DETECTED Final   Enterobacterales DETECTED (A) NOT DETECTED Final    Comment: Enterobacterales represent a large order of gram negative bacteria, not a single organism. CRITICAL RESULT CALLED TO, READ BACK BY AND VERIFIED WITH: [place], PHARMD AT 1350 ON 01/10/21 BY GM    Enterobacter cloacae complex NOT DETECTED NOT DETECTED Final   Escherichia coli DETECTED (A) NOT DETECTED Final    Comment: CRITICAL RESULT CALLED TO, READ BACK BY AND VERIFIED WITH: [place], PHARMD AT 1350 ON 01/10/21 BY GM    Klebsiella aerogenes NOT DETECTED NOT  DETECTED Final   Klebsiella oxytoca NOT DETECTED NOT DETECTED Final   Klebsiella pneumoniae NOT DETECTED NOT DETECTED Final   Proteus species NOT DETECTED NOT DETECTED Final   Salmonella species NOT DETECTED NOT DETECTED Final   Serratia marcescens NOT DETECTED NOT DETECTED Final   Haemophilus influenzae NOT DETECTED NOT DETECTED Final   Neisseria meningitidis NOT DETECTED NOT DETECTED Final   Pseudomonas aeruginosa NOT DETECTED NOT DETECTED Final   Stenotrophomonas maltophilia NOT DETECTED NOT DETECTED  Final   Candida albicans NOT DETECTED NOT DETECTED Final   Candida auris NOT DETECTED NOT DETECTED Final   Candida glabrata NOT DETECTED NOT DETECTED Final   Candida krusei NOT DETECTED NOT DETECTED Final   Candida parapsilosis NOT DETECTED NOT DETECTED Final   Candida tropicalis NOT DETECTED NOT DETECTED Final   Cryptococcus neoformans/gattii NOT DETECTED NOT DETECTED Final   CTX-M ESBL NOT DETECTED NOT DETECTED Final   Carbapenem resistance IMP NOT DETECTED NOT DETECTED Final   Carbapenem resistance KPC NOT DETECTED NOT DETECTED Final   Carbapenem resistance NDM NOT DETECTED NOT DETECTED Final   Carbapenem resist OXA 48 LIKE NOT DETECTED NOT DETECTED Final   Carbapenem resistance VIM NOT DETECTED NOT DETECTED Final    Comment: Performed at [facility], [address] [place], [place] [address]  Urine Culture     Status: Abnormal   Collection Time: 01/10/21  1:05 PM   Specimen: Urine, Random  Result Value Ref Range Status   Specimen Description   Final    URINE, RANDOM Performed at [facility], [address], [place], [place] [address]    Special Requests   Final    NONE Performed at [facility], [address] [place], [place] [address]    Culture MULTIPLE SPECIES PRESENT, SUGGEST RECOLLECTION (A)  Final   Report Status 01/11/2021 FINAL  Final    Coagulation Studies: No results for input(s): LABPROT, INR in the last 72  hours.  Urinalysis: No results for input(s): COLORURINE, LABSPEC, PHURINE, GLUCOSEU, HGBUR, BILIRUBINUR, KETONESUR, PROTEINUR, UROBILINOGEN, NITRITE, LEUKOCYTESUR in the last 72 hours.  Invalid input(s): APPERANCEUR    Imaging: No results found.   Medications:    sodium chloride 10 mL/hr at 01/17/21 0427    ceFAZolin (ANCEF) IV 1 g (01/19/21 2216)   levETIRAcetam 500 mg (01/19/21 2330)    (feeding supplement) PROSource Plus  30 mL Oral TID BM   amiodarone  200 mg Oral BID   apixaban  5 mg Oral BID   Chlorhexidine Gluconate Cloth  6 each Topical Daily   feeding supplement (NEPRO CARB STEADY)  237 mL Oral BID BM   heparin sodium (porcine)       heparin sodium (porcine)  2,000 Units Intracatheter Once   insulin aspart  0-15 Units Subcutaneous TID WC   insulin glargine-yfgn  15 Units Subcutaneous Daily   multivitamin  1 tablet Oral QHS   rosuvastatin  40 mg Oral QPM   sodium chloride, acetaminophen (TYLENOL) oral liquid 160 mg/5 mL, ALPRAZolam, haloperidol lactate, ondansetron (ZOFRAN) IV, polyethylene glycol  Assessment/ Plan:  [name] is a 70 y.o.  female   Principal Problem:   Acute delirium Active Problems:   ATN (acute tubular necrosis) (HCC)   Speech abnormality   Sepsis ([place])   Cerebrovascular accident (CVA) ([place])   Altered mental status   Sepsis due to gram-negative urinary tract infection ([place])  70 year old female who is bedbound with history of cerebrovascular accident and right-sided hemiparesis, diabetes, peripheral vascular disease, hypertension, atrial fibrillation and now with history of acute kidney injury on the top of chronic kidney disease leading to dialysis treatments.  She is now being dialyzed via right femoral dialysis catheter.   Acute kidney injury/End Stage Renal Disease on hemodialysis:   Urine output is minimal.  Continue dialysis and attempt to remove 1 to 2 L of fluid today  Intake/Output Summary (Last 24 hours) at 01/20/2021  1302 Last data filed at 01/20/2021 0400 Gross  per 24 hour  Intake 367.93 ml  Output --  Net 367.93 ml    2. Hypertension with chronic kidney disease:    BP 129/76   Pulse 90   Temp 98.1 F (36.7 C)   Resp (!) 22   Ht 5\' 7"  (1.702 m)   Wt 116.6 kg   SpO2 99%   BMI 40.25 kg/m   3. Anemia of chronic kidney disease/ kidney injury/chronic disease/acute blood loss: Continue anemia protocol Lab Results  Component Value Date   HGB 8.8 (L) 01/20/2021    4. Secondary Hyperparathyroidism: We will monitor closely   Lab Results  Component Value Date   CALCIUM 8.5 (L) 01/20/2021   PHOS 5.8 (H) 01/13/2021   #5: E. coli bacteremia: We will continue cefazolin as per the ID physician advised  Continue to monitor closely.  Patient need to be made regarding long-term dialysis treatments   LOS: [place] 11/12/20221:02 PM

## 2021-01-21 DIAGNOSIS — N170 Acute kidney failure with tubular necrosis: Secondary | ICD-10-CM | POA: Diagnosis not present

## 2021-01-21 DIAGNOSIS — R471 Dysarthria and anarthria: Secondary | ICD-10-CM | POA: Diagnosis not present

## 2021-01-21 DIAGNOSIS — A4150 Gram-negative sepsis, unspecified: Secondary | ICD-10-CM | POA: Diagnosis not present

## 2021-01-21 DIAGNOSIS — N390 Urinary tract infection, site not specified: Secondary | ICD-10-CM | POA: Diagnosis not present

## 2021-01-21 LAB — GLUCOSE, CAPILLARY
Glucose-Capillary: 172 mg/dL — ABNORMAL HIGH (ref 70–99)
Glucose-Capillary: 241 mg/dL — ABNORMAL HIGH (ref 70–99)
Glucose-Capillary: 195 mg/dL — ABNORMAL HIGH (ref 70–99)
Glucose-Capillary: 181 mg/dL — ABNORMAL HIGH (ref 70–99)

## 2021-01-21 NOTE — Progress Notes (Signed)
PROGRESS NOTE  Sandra Massey    DOB: Aug 06, 1950, 70 y.o.  ACZ:[id number]  PCP: [name], MD   Code Status: DNR   DOA: 01/09/2021   LOS: 12  Brief Narrative of Current Hospitalization  Sandra Massey is a 70 y.o. female with a PMH significant for Jehovah's Witness, CVA with residual right-sided hemiparesis, chronic A. fib, type II DM, HTN. They presented from SNF to the ED on 01/09/2021 with acute AMS. In the ED, it was found that they had no acute changes on head CT but abnormal electrical activity on EEG.  Neurology was consulted. They were treated with Keppra.  Additionally, patient was septic with blood cultures positive for E. coli.  She was started on IV ceftriaxone and nephrology was consulted. Patient was admitted to medicine service for further workup and management of sepsis with acute encephalopathy as outlined in detail below.  01/21/21 -stable  Assessment & Plan  Principal Problem:   Acute delirium Active Problems:   ATN (acute tubular necrosis) (HCC)   Speech abnormality   Sepsis (HCC)   Cerebrovascular accident (CVA) ([place])   Altered mental status   Sepsis due to gram-negative urinary tract infection ([place])  Sepsis secondary to E. coli bacteremia with acute encephalopathy-patient somewhat tired this morning. Had some agitation overnight.   Again, unsure of patient's baseline.  Blood cultures positive for E. coli which was sensitive to cephalosporins.  She has completed treatment for her infection. Leukocytosis 11.7>15.1. Afebrile and asymptomatic. Will continue to monitor Patient is stable to discharge once final decision can be made about long-term disposition of her dialysis.  -Ancef (11/7-11/12), per ID - CTX (11/3-11/6) - cefepime (11/2) - flagyl 11/2 - vancomycin 11/2 - PT/OT - CBC am  AKI on CKDIIIb  hypokalemia-temporary dialysis catheter placed and received HD starting 11/6. TThS schedule. Creatinine 4.62> 3.67>3.97. Baseline around 1.58. Will need  clarification from her medical decision maker (sister- [phone]) as to [place] with continuing long term dialysis. If patient is to continue on long-term HD, she will need a more permanent access inserted and set up outpatient HD. She could at that point be discharged to SNF. Patient still lacks capacity for decision making.  -Nephrology following, appreciate recommendations - appreciate the help of palliative and TOC in attempts to contact family for medical decision making decisions.  - RFP on HD days  Anemia of chronic disease- Patient is Jehova's witness. Hgb very gradually declining throughout hospitalization likely in part due to dilution with significant IV fluids. Hgb 9.2>8.7>8.8 - CBC on HD days  A. Fib  HTN-CHA2DS2-VASc score= 6.  Rate controlled. -Continue home Eliquis -Continue home amiodarone 200 mg twice daily  Type II DM- hgb A1c on admission 6.9. blood sugars stable. - continue mSSI -15 units semglee daily  Seizure prophylaxis- no active seizure activity or repetitive motions observed -Neurology following, appreciate recommendations - continue keppra  H/o CVA  HLD-residual right-sided hemiparesis and dysphagia -Continue home rosuvastatin - psychiatry consulted-   DVT prophylaxis:   Heparin apixaban (ELIQUIS) tablet 5 mg   Diet:  Diet Orders (From admission, onward)     Start     Ordered   01/17/21 1025  Diet Carb Modified Fluid consistency: Thin; Room service appropriate? No  Diet effective now       Question Answer Comment  Diet-HS Snack? Nothing   Calorie Level Medium 1600-2000   Fluid consistency: Thin   Room service appropriate? No      01/17/21 1024  Subjective 01/21/21    Pt reports no complaints today.She appears tired. She is hungry.   Disposition Plan & Communication  Patient status: Inpatient  Admitted From: SNF Disposition: Skilled nursing facility Anticipated discharge date: TBD, pending return to baseline and decision on  long-term dialysis placement  Family Communication: none at bedside  Consults, Procedures, Significant Events  Consultants:  ID Neurology Nephrology  Psychiatry  Palliative   Procedures/significant events:  Temp cath, started HD 11/6  Antimicrobials:  Anti-infectives (From admission, onward)    Start     Dose/Rate Route Frequency Ordered Stop   01/19/21 1000  ceFAZolin (ANCEF) IVPB 1 g/50 mL premix        1 g 100 mL/hr over 30 Minutes Intravenous Every 12 hours 01/19/21 0735 01/20/21 2228   01/16/21 1800  ceFAZolin (ANCEF) IVPB 1 g/50 mL premix  Status:  Discontinued        1 g 100 mL/hr over 30 Minutes Intravenous Every 24 hours 01/15/21 1418 01/19/21 0735   01/15/21 1800  ceFAZolin (ANCEF) IVPB 2g/100 mL premix        2 g 200 mL/hr over 30 Minutes Intravenous  Once 01/15/21 1417 01/15/21 1822   01/15/21 0900  ceFAZolin (ANCEF) IVPB 1 g/50 mL premix  Status:  Discontinued        1 g 100 mL/hr over 30 Minutes Intravenous Every 24 hours 01/14/21 1502 01/15/21 1417   01/11/21 1300  vancomycin (VANCOREADY) IVPB 750 mg/150 mL  Status:  Discontinued        750 mg 150 mL/hr over 60 Minutes Intravenous Every 24 hours 01/10/21 0235 01/10/21 1116   01/11/21 0800  vancomycin (VANCOREADY) IVPB 1500 mg/300 mL  Status:  Discontinued        1,500 mg 150 mL/hr over 120 Minutes Intravenous Every 48 hours 01/10/21 1116 01/10/21 1448   01/11/21 0800  cefTRIAXone (ROCEPHIN) 2 g in sodium chloride 0.9 % 100 mL IVPB  Status:  Discontinued        2 g 200 mL/hr over 30 Minutes Intravenous Every 24 hours 01/10/21 1448 01/14/21 1502   01/10/21 1200  ceFEPIme (MAXIPIME) 2 g in sodium chloride 0.9 % 100 mL IVPB  Status:  Discontinued        2 g 200 mL/hr over 30 Minutes Intravenous Every 12 hours 01/10/21 0219 01/10/21 1448   01/09/21 2245  metroNIDAZOLE (FLAGYL) IVPB 500 mg  Status:  Discontinued        500 mg 100 mL/hr over 60 Minutes Intravenous 2 times daily 01/09/21 2228 01/10/21 1448    01/09/21 2245  vancomycin (VANCOREADY) IVPB 2000 mg/400 mL        2,000 mg 200 mL/hr over 120 Minutes Intravenous  Once 01/09/21 2234 01/10/21 0708   01/09/21 2230  ceFEPIme (MAXIPIME) 2 g in sodium chloride 0.9 % 100 mL IVPB        2 g 200 mL/hr over 30 Minutes Intravenous  Once 01/09/21 2228 01/10/21 0204   01/09/21 2230  vancomycin (VANCOCIN) IVPB 1000 mg/200 mL premix  Status:  Discontinued        1,000 mg 200 mL/hr over 60 Minutes Intravenous  Once 01/09/21 2228 01/09/21 2234       Objective   Vitals:   01/20/21 1343 01/20/21 1541 01/20/21 2118 01/21/21 0118  BP: 123/79 136/74 138/66 135/75  Pulse: 81 88 83 92  Resp: (!) 24  18 18   Temp: 98.1 F (36.7 C) 97.9 F (36.6 C) 98.6 F (37 C) 98.2 F (  36.8 C)  TempSrc: Oral Oral    SpO2: 94% 97% (!) 88% (!) 89%  Weight:      Height:        Intake/Output Summary (Last 24 hours) at 01/21/2021 0803 Last data filed at 01/20/2021 1245 Gross per 24 hour  Intake --  Output 495 ml  Net -495 ml    Filed Weights   01/09/21 1352 01/16/21 0900 01/20/21 0451  Weight: 136.1 kg 111.1 kg 116.6 kg    Patient BMI: Body mass index is 40.25 kg/m.   Physical Exam: General: awake, alert, NAD HEENT: atraumatic, clear conjunctiva, anicteric sclera, moist mucus membranes, hearing grossly normal Respiratory: normal respiratory effort. Cardiovascular: normal S1/S2,  RRR, no JVD, murmurs, rubs, gallops, quick capillary refill  Gastrointestinal: soft, NT, ND, no HSM felt Nervous: Alert, oriented to self, location.  Answers questions with simple answers but unable to express understanding of her condition or consequences of therapy/withholding therapy Extremities: non-pitting LE edema, normal tone Skin: dry, intact, normal temperature, normal color, No rashes, lesions or ulcers on exposed skin  Labs   I have personally reviewed following labs and imaging studies BMP Latest Ref Rng & Units 01/20/2021 01/19/2021 01/18/2021  Glucose 70 - 99  mg/dL 208(H) 216(H) 278(H)  BUN 8 - 23 mg/dL 31(H) 20 37(H)  Creatinine 0.44 - 1.00 mg/dL 3.81(H) 2.79(H) 3.97(H)  Sodium 135 - 145 mmol/L 136 136 134(L)  Potassium 3.5 - 5.1 mmol/L 3.7 3.6 3.3(L)  Chloride 98 - 111 mmol/L 100 100 99  CO2 22 - 32 mmol/L 28 28 27   Calcium 8.9 - 10.3 mg/dL 8.5(L) 8.0(L) 8.0(L)   CBC    Component Value Date/Time   WBC 15.1 (H) 01/20/2021 0622   RBC 3.12 (L) 01/20/2021 0622   HGB 8.8 (L) 01/20/2021 0622   HCT 27.9 (L) 01/20/2021 0622   PLT 278 01/20/2021 0622   MCV 89.4 01/20/2021 0622   MCH 28.2 01/20/2021 0622   MCHC 31.5 01/20/2021 0622   RDW 18.4 (H) 01/20/2021 0622   LYMPHSABS 1.3 01/10/2021 0009   MONOABS 1.2 (H) 01/10/2021 0009   EOSABS 0.0 01/10/2021 0009   BASOSABS 0.1 01/10/2021 0009   Imaging Studies  No results found. Medications   Scheduled Meds:  (feeding supplement) PROSource Plus  30 mL Oral TID BM   amiodarone  200 mg Oral BID   apixaban  5 mg Oral BID   Chlorhexidine Gluconate Cloth  6 each Topical Daily   feeding supplement (NEPRO CARB STEADY)  237 mL Oral BID BM   heparin sodium (porcine)  2,000 Units Intracatheter Once   insulin aspart  0-15 Units Subcutaneous TID WC   insulin glargine-yfgn  15 Units Subcutaneous Daily   multivitamin  1 tablet Oral QHS   rosuvastatin  40 mg Oral QPM   No recently discontinued medications to reconcile  LOS: 12 days   Time spent: >70min  Rhianon Zabawa L Kattleya Kuhnert, DO [facility] 01/21/2021, 8:03 AM   Please refer to amion to contact the [facility] Attending or Consulting provider for this pt  [email or web address] Available by Epic secure chat 7AM-7PM. If 7PM-7AM, please contact night-coverage

## 2021-01-21 NOTE — Evaluation (Signed)
Physical Therapy Evaluation Patient Details Name: Sandra Massey MRN: [id number] DOB: 1950-03-14 Today's Date: 01/21/2021  History of Present Illness  Pt is a 70 y/o F admitted on 01/09/21 after presenting from SNF with c/o acute AMS. Pt found to have abnormal electrical activity on EEG & neurology was consulted. Pt was also found to be septic with blood cultures positive for E. coli. PMH: CVA with residual R sided hemiparesis, chronic a-fib, DM2, HTN  Clinical Impression  Pt seen for PT evaluation with co-tx with OT. Pt demonstrates impaired cognition but states she was walking prior to admission (question the accuracy of this). No family present to provide PLOF information but chart states pt was at [facility]. On this date pt requires +2 for supine<>sit & max assist for static sitting EOB. Pt with hx of R hemiparesis & doesn't appear able to use RUE. Pt returned supine & assisted with rolling to change gown & pad underneath her. Will continue to follow pt acutely to progress independence with bed mobility & sitting balance.        Recommendations for follow up therapy are one component of a multi-disciplinary discharge planning process, led by the attending physician.  Recommendations may be updated based on patient status, additional functional criteria and insurance authorization.  Follow Up Recommendations Skilled nursing-short term rehab (<3 hours/day)    Assistance Recommended at Discharge Frequent or constant Supervision/Assistance  Functional Status Assessment  (unsure, as pt is poor historian & no one present to confirm PLOF)  Equipment Recommendations  None recommended by PT    Recommendations for Other Services       Precautions / Restrictions Precautions Precautions: Fall Restrictions Weight Bearing Restrictions: No      Mobility  Bed Mobility Overal bed mobility: Needs Assistance Bed Mobility: Rolling;Supine to Sit;Sit to Supine Rolling: +2 for physical assistance;Max  assist   Supine to sit: +2 for physical assistance;Max assist Sit to supine: +2 for physical assistance;Total assist   General bed mobility comments: Pt able to use L UE to assist w/ bed mobility    Transfers                   General transfer comment: unable    Ambulation/Gait                  Stairs            Wheelchair Mobility    Modified Rankin (Stroke Patients Only)       Balance Overall balance assessment: Needs assistance Sitting-balance support: Single extremity supported;Feet supported Sitting balance-Leahy Scale: Zero Sitting balance - Comments: L lateral lean, up to max assist for static sitting balance EOB     Standing balance-Leahy Scale: Zero                               Pertinent Vitals/Pain Pain Assessment: Faces Faces Pain Scale: Hurts a little bit Breathing: normal Negative Vocalization: occasional moan/groan, low speech, negative/disapproving quality Facial Expression: facial grimacing Body Language: tense, distressed pacing, fidgeting Consolability: distracted or reassured by voice/touch PAINAD Score: 5 Pain Location: generalized Pain Descriptors / Indicators: Discomfort Pain Intervention(s): Monitored during session;Repositioned    Home Living Family/patient expects to be discharged to:: Skilled nursing facility                   Additional Comments: Pt currently resides at [facility]    Prior Function Prior Level of  Function : Needs assist             Mobility Comments: R side hemiplegia ADLs Comments: Max A for ADL     Hand Dominance        Extremity/Trunk Assessment   Upper Extremity Assessment Upper Extremity Assessment: Defer to OT evaluation;Generalized weakness;RUE deficits/detail    Lower Extremity Assessment Lower Extremity Assessment: Generalized weakness;RLE deficits/detail RLE Deficits / Details: R side hemiplegia       Communication   Communication: Expressive  difficulties  Cognition Arousal/Alertness: Awake/alert Behavior During Therapy: Flat affect;Anxious Overall Cognitive Status: History of cognitive impairments - at baseline                                 General Comments: Oriented to self, location, unable to provide year. Cries out occassionally        General Comments      Exercises Other Exercises Other Exercises: Bed mobility, seated balance tolerance, UB dressing, cognitive re-orientation/reassurance   Assessment/Plan    PT Assessment Patient needs continued PT services  PT Problem List Decreased strength;Decreased mobility;Decreased safety awareness;Decreased activity tolerance;Decreased balance;Decreased knowledge of use of DME;Decreased cognition;Decreased coordination;Decreased range of motion       PT Treatment Interventions DME instruction;Therapeutic activities;Modalities;Cognitive remediation;Gait training;Therapeutic exercise;Balance training;Wheelchair mobility training;Patient/family education;Functional mobility training;Neuromuscular re-education;Manual techniques    PT Goals (Current goals can be found in the Care Plan section)  Acute Rehab PT Goals PT Goal Formulation: Patient unable to participate in goal setting Time For Goal Achievement: 01/14/2021 Potential to Achieve Goals: Fair    Frequency Min 2X/week   Barriers to discharge        Co-evaluation PT/OT/SLP Co-Evaluation/Treatment: Yes Reason for Co-Treatment: Complexity of the patient's impairments (multi-system involvement);For patient/therapist safety;Necessary to address cognition/behavior during functional activity PT goals addressed during session: Mobility/safety with mobility;Balance;Strengthening/ROM OT goals addressed during session: ADL's and self-care;Strengthening/ROM       AM-PAC PT "6 Clicks" Mobility  Outcome Measure Help needed turning from your back to your side while in a flat bed without using bedrails?:  Total Help needed moving from lying on your back to sitting on the side of a flat bed without using bedrails?: Total Help needed moving to and from a bed to a chair (including a wheelchair)?: Total Help needed standing up from a chair using your arms (e.g., wheelchair or bedside chair)?: Total Help needed to walk in hospital room?: Total Help needed climbing 3-5 steps with a railing? : Total 6 Click Score: 6    End of Session   Activity Tolerance: Patient limited by fatigue;Patient limited by pain Patient left: in bed;with bed alarm set;with call bell/phone within reach Nurse Communication: Mobility status PT Visit Diagnosis: Muscle weakness (generalized) (M62.81)    Time: 8563-1497 PT Time Calculation (min) (ACUTE ONLY): 19 min   Charges:   PT Evaluation $PT Eval High Complexity: 1 High          Lavone Nian, PT, DPT 01/21/21, 1:14 PM   Waunita Schooner 01/21/2021, 1:12 PM

## 2021-01-21 NOTE — Progress Notes (Signed)
[facility]  PROGRESS NOTE   Subjective:   Awake and alert, breathing much better today.  Objective:  Vital signs in last 24 hours:  Temp:  [97.9 F (36.6 C)-98.6 F (37 C)] 97.9 F (36.6 C) (11/13 1151) Pulse Rate:  [83-95] 88 (11/13 1151) Resp:  [18] 18 (11/13 1151) BP: (135-138)/(66-78) 138/74 (11/13 1151) SpO2:  [88 %-97 %] 93 % (11/13 1151)  Weight change:  Filed Weights   01/09/21 1352 01/16/21 0900 01/20/21 0451  Weight: 136.1 kg 111.1 kg 116.6 kg    Intake/Output: I/O last 3 completed shifts: In: 150 [IV Piggyback:150] Out: 495 [Other:495]   Intake/Output this shift:  No intake/output data recorded.  Physical Exam: General:  No acute distress  Head:  Normocephalic, atraumatic. Moist oral mucosal membranes  Eyes:  Anicteric  Neck:  Supple  Lungs:   Clear to auscultation, normal effort  Heart:  S1S2 no rubs  Abdomen:   Soft, nontender, bowel sounds present  Extremities:  peripheral edema.  Neurologic:  Awake, alert, following commands  Skin:  No lesions  Access:     Basic Metabolic Panel: Recent Labs  Lab 01/16/21 0514 01/17/21 1456 01/18/21 0440 01/19/21 0535 01/20/21 0622  NA 139 136 134* 136 136  K 3.6 3.5 3.3* 3.6 3.7  CL 100 99 99 100 100  CO2 21* 26 27 28 28   GLUCOSE 127* 220* 278* 216* 208*  BUN 37* 30* 37* 20 31*  CREATININE 3.67* 3.68* 3.97* 2.79* 3.81*  CALCIUM 6.9* 8.1* 8.0* 8.0* 8.5*    CBC: Recent Labs  Lab 01/17/21 1456 01/18/21 0440 01/19/21 0535 01/20/21 0622  WBC 8.8 9.7 11.7* 15.1*  HGB 9.7* 9.2* 8.7* 8.8*  HCT 29.9* 28.7* 27.1* 27.9*  MCV 84.7 85.7 88.0 89.4  PLT 237 239 256 278     Urinalysis: No results for input(s): COLORURINE, LABSPEC, PHURINE, GLUCOSEU, HGBUR, BILIRUBINUR, KETONESUR, PROTEINUR, UROBILINOGEN, NITRITE, LEUKOCYTESUR in the last 72 hours.  Invalid input(s): APPERANCEUR    Imaging: No results found.   Medications:    sodium chloride 10 mL/hr at 01/17/21 0427    levETIRAcetam 500 mg (01/21/21 0944)    (feeding supplement) PROSource Plus  30 mL Oral TID BM   amiodarone  200 mg Oral BID   apixaban  5 mg Oral BID   Chlorhexidine Gluconate Cloth  6 each Topical Daily   feeding supplement (NEPRO CARB STEADY)  237 mL Oral BID BM   heparin sodium (porcine)  2,000 Units Intracatheter Once   insulin aspart  0-15 Units Subcutaneous TID WC   insulin glargine-yfgn  15 Units Subcutaneous Daily   multivitamin  1 tablet Oral QHS   rosuvastatin  40 mg Oral QPM    Assessment/ Plan:     Principal Problem:   Acute delirium Active Problems:   ATN (acute tubular necrosis) (HCC)   Speech abnormality   Sepsis (HCC)   Cerebrovascular accident (CVA) ([place])   Altered mental status   Sepsis due to gram-negative urinary tract infection ([place])   70 year old female who is bedbound with history of cerebrovascular accident and right-sided hemiparesis, diabetes, peripheral vascular disease, hypertension, atrial fibrillation and now with history of acute kidney injury on the top of chronic kidney disease leading to dialysis treatments.  She is now being dialyzed via right femoral dialysis catheter.  #1: Acute kidney injury/end-stage renal disease: Patient has acute kidney injury on the top of chronic kidney disease now needing dialysis intervention.  She had stable dialysis via groin catheter yesterday.  #  2: Hypertension: Continue present antihypertensive medications for  #3: Diabetes: Continue insulin as ordered.  #4: E. coli bacteremia: Presently off of antibiotics.  #5: Anemia: We will continue anemia protocol.  We will continue to monitor closely.   LOS: [place], [facility] 11/13/20222:22 PM

## 2021-01-21 NOTE — Evaluation (Signed)
Occupational Therapy Evaluation Patient Details Name: Sandra Massey MRN: [id number] DOB: 1950-08-03 Today's Date: 01/21/2021   History of Present Illness Sandra Massey is a 70 y.o. female with a PMH of CVA with residual right-sided hemiparesis, chronic A. fib, type II DM, HTN. She presented from SNF to ED on 01/09/2021 with acute AMS. In the ED, it was found that she had no acute changes on head CT but abnormal electrical activity on EEG, as well as septic with blood cultures positive for E. coli. Neurology consulted. Pt treated with Keppra and started on IV ceftriaxone.   Clinical Impression   Sandra Massey presents today with generalized weakness, limited endurance, and cognitive impairment. She is is unable to provide information regarding her previous level of functioning or home situation. Based on chart review, this pt comes from [facility], where both she and her sister reside. Sandra Massey is able to provide her name and is aware that she is at "a hospital." She cannot identify the year; does not respond to questioning re: pain but does cry out on occasion, especially with movement of R UE. During today's evaluation, she required +2-+3 assist for bed mobility, with Max A +2 for maintaining balance seated EOB for ~ 3 minutes. Pt was able to assist with rolling in bed, following directions to use L UE to reach contralaterally and pull on R bed rail. Pt is likely not far off baseline level of performance; however, she can benefit from ongoing OT while hospitalized, to assist with mobility, cognitive awareness, and strength. Recommend DC to SNF.      Recommendations for follow up therapy are one component of a multi-disciplinary discharge planning process, led by the attending physician.  Recommendations may be updated based on patient status, additional functional criteria and insurance authorization.   Follow Up Recommendations  Long-term institutional care without follow-up therapy    Assistance  Recommended at Discharge Frequent or constant Supervision/Assistance  Functional Status Assessment  Patient has had a recent decline in their functional status and/or demonstrates limited ability to make significant improvements in function in a reasonable and predictable amount of time  Equipment Recommendations  None recommended by OT    Recommendations for Other Services       Precautions / Restrictions Precautions Precautions: Fall Restrictions Weight Bearing Restrictions: No      Mobility Bed Mobility Overal bed mobility: Needs Assistance Bed Mobility: Rolling;Supine to Sit;Sit to Supine Rolling: +2 for physical assistance;Max assist   Supine to sit: +2 for physical assistance;Max assist Sit to supine: +2 for physical assistance;Total assist   General bed mobility comments: Pt able to use L UE to assist w/ bed mobility    Transfers                   General transfer comment: unable      Balance Overall balance assessment: Needs assistance Sitting-balance support: Single extremity supported;Feet supported Sitting balance-Leahy Scale: Poor       Standing balance-Leahy Scale: Zero                             ADL either performed or assessed with clinical judgement   ADL Overall ADL's : Needs assistance/impaired                 Upper Body Dressing : Moderate assistance Upper Body Dressing Details (indicate cue type and reason): Pt able to assist with threading L arm through  gown, total A for R arm                 Functional mobility during ADLs: +2 for physical assistance;Maximal assistance       Vision         Perception     Praxis      Pertinent Vitals/Pain Pain Assessment: PAINAD Breathing: normal Negative Vocalization: occasional moan/groan, low speech, negative/disapproving quality Facial Expression: facial grimacing Body Language: tense, distressed pacing, fidgeting Consolability: distracted or reassured by  voice/touch PAINAD Score: 5 Pain Intervention(s): Repositioned     Hand Dominance     Extremity/Trunk Assessment Upper Extremity Assessment Upper Extremity Assessment: Generalized weakness;RUE deficits/detail (R side hemiplegia)   Lower Extremity Assessment Lower Extremity Assessment: Generalized weakness;RLE deficits/detail RLE Deficits / Details: R side hemiplegia       Communication Communication Communication: Expressive difficulties   Cognition Arousal/Alertness: Lethargic Behavior During Therapy: Flat affect;Anxious Overall Cognitive Status: History of cognitive impairments - at baseline                                 General Comments: Oriented to self, location, unable to provide year. Cries out occassionally     General Comments       Exercises Other Exercises Other Exercises: Bed mobility, seated balance tolerance, UB dressing, cognitive re-orientation/reassurance   Shoulder Instructions      Home Living Family/patient expects to be discharged to:: Skilled nursing facility                                 Additional Comments: Pt currently resides at [facility]      Prior Functioning/Environment Prior Level of Function : Needs assist             Mobility Comments: R side hemiplegia ADLs Comments: Max A for ADL        OT Problem List: Decreased strength;Impaired balance (sitting and/or standing);Decreased cognition;Impaired tone;Obesity;Decreased range of motion;Decreased activity tolerance;Decreased coordination      OT Treatment/Interventions: Self-care/ADL training;DME and/or AE instruction;Therapeutic activities;Balance training;Therapeutic exercise;Energy conservation;Patient/family education    OT Goals(Current goals can be found in the care plan section) Acute Rehab OT Goals Patient Stated Goal: pt unable to participate in goal-setting OT Goal Formulation: With patient Time For Goal Achievement:  01/30/2021 Potential to Achieve Goals: Fair ADL Goals Pt Will Perform Eating: with min assist;with adaptive utensils Pt Will Perform Grooming: with mod assist;bed level;sitting (sitting as able) Pt Will Perform Upper Body Dressing: with mod assist;bed level;sitting (in sitting as able)  OT Frequency: Min 2X/week   Barriers to D/C:            Co-evaluation PT/OT/SLP Co-Evaluation/Treatment: Yes Reason for Co-Treatment: Complexity of the patient's impairments (multi-system involvement);For patient/therapist safety PT goals addressed during session: Mobility/safety with mobility;Strengthening/ROM;Balance OT goals addressed during session: ADL's and self-care;Strengthening/ROM      AM-PAC OT "6 Clicks" Daily Activity     Outcome Measure Help from another person eating meals?: A Little Help from another person taking care of personal grooming?: A Lot Help from another person toileting, which includes using toliet, bedpan, or urinal?: Total Help from another person bathing (including washing, rinsing, drying)?: A Lot Help from another person to put on and taking off regular upper body clothing?: A Lot Help from another person to put on and taking off regular lower body clothing?: Total  6 Click Score: 11   End of Session    Activity Tolerance: Patient limited by lethargy Patient left: in bed;with bed alarm set;with call bell/phone within reach  OT Visit Diagnosis: Muscle weakness (generalized) (M62.81);Other abnormalities of gait and mobility (R26.89)                Time: 3361-2244 OT Time Calculation (min): 21 min Charges:  OT General Charges $OT Visit: 1 Visit OT Evaluation $OT Eval Low Complexity: 1 Low Josiah Lobo, PhD, MS, OTR/L 01/21/21, 12:42 PM

## 2021-01-22 ENCOUNTER — Other Ambulatory Visit (INDEPENDENT_AMBULATORY_CARE_PROVIDER_SITE_OTHER): Payer: Self-pay | Admitting: Vascular Surgery

## 2021-01-22 DIAGNOSIS — N186 End stage renal disease: Secondary | ICD-10-CM | POA: Diagnosis not present

## 2021-01-22 DIAGNOSIS — N390 Urinary tract infection, site not specified: Secondary | ICD-10-CM | POA: Diagnosis not present

## 2021-01-22 DIAGNOSIS — N170 Acute kidney failure with tubular necrosis: Secondary | ICD-10-CM | POA: Diagnosis not present

## 2021-01-22 DIAGNOSIS — R471 Dysarthria and anarthria: Secondary | ICD-10-CM | POA: Diagnosis not present

## 2021-01-22 DIAGNOSIS — A4150 Gram-negative sepsis, unspecified: Secondary | ICD-10-CM | POA: Diagnosis not present

## 2021-01-22 LAB — GLUCOSE, CAPILLARY
Glucose-Capillary: 159 mg/dL — ABNORMAL HIGH (ref 70–99)
Glucose-Capillary: 187 mg/dL — ABNORMAL HIGH (ref 70–99)
Glucose-Capillary: 187 mg/dL — ABNORMAL HIGH (ref 70–99)
Glucose-Capillary: 132 mg/dL — ABNORMAL HIGH (ref 70–99)

## 2021-01-22 LAB — RENAL FUNCTION PANEL
Albumin: 2.2 g/dL — ABNORMAL LOW (ref 3.5–5.0)
Anion gap: 9 (ref 5–15)
BUN: 31 mg/dL — ABNORMAL HIGH (ref 8–23)
CO2: 28 mmol/L (ref 22–32)
Calcium: 8.8 mg/dL — ABNORMAL LOW (ref 8.9–10.3)
Chloride: 98 mmol/L (ref 98–111)
Creatinine, Ser: 4.02 mg/dL — ABNORMAL HIGH (ref 0.44–1.00)
GFR, Estimated: 12 mL/min — ABNORMAL LOW (ref >60)
Glucose, Bld: 176 mg/dL — ABNORMAL HIGH (ref 70–99)
Phosphorus: 5.1 mg/dL — ABNORMAL HIGH (ref 2.5–4.6)
Potassium: 4.6 mmol/L (ref 3.5–5.1)
Sodium: 135 mmol/L (ref 135–145)

## 2021-01-22 NOTE — Progress Notes (Signed)
[facility]  PROGRESS NOTE   Subjective:   Patient able to hold a conversation. Sister and patient have decided to pursue aggressive measures and wants to continue hemodialysis treatment.   Creatinine 4.02 (3.81). Patient received hemodialysis treatment on Saturday.   Objective:  Vital signs in last 24 hours:  Temp:  [97.7 F (36.5 C)-98.4 F (36.9 C)] 98.2 F (36.8 C) (11/14 1421) Pulse Rate:  [56-90] 63 (11/14 1421) Resp:  [18-20] 18 (11/14 1421) BP: (114-140)/(52-78) 114/52 (11/14 1421) SpO2:  [91 %-95 %] 95 % (11/14 1421)  Weight change:  Filed Weights   01/09/21 1352 01/16/21 0900 01/20/21 0451  Weight: 136.1 kg 111.1 kg 116.6 kg    Intake/Output: I/O last 3 completed shifts: In: 17 [P.O.:720; IV Piggyback:100] Out: -    Intake/Output this shift:  Total I/O In: 360 [P.O.:360] Out: -   Physical Exam: General:  No acute distress  Head:  Normocephalic, atraumatic. Moist oral mucosal membranes  Eyes:  Anicteric  Neck:  Supple  Lungs:   Clear to auscultation, normal effort  Heart:  regular  Abdomen:   Soft, nontender, bowel sounds present  Extremities:  peripheral edema.  Neurologic:  Awake, alert, following commands  Skin:  No lesions  Access: Right femoral temp HD catheter    Basic Metabolic Panel: Recent Labs  Lab 01/17/21 1456 01/18/21 0440 01/19/21 0535 01/20/21 0622 01/22/21 0936  NA 136 134* 136 136 135  K 3.5 3.3* 3.6 3.7 4.6  CL 99 99 100 100 98  CO2 26 27 28 28 28   GLUCOSE 220* 278* 216* 208* 176*  BUN 30* 37* 20 31* 31*  CREATININE 3.68* 3.97* 2.79* 3.81* 4.02*  CALCIUM 8.1* 8.0* 8.0* 8.5* 8.8*  PHOS  --   --   --   --  5.1*     CBC: Recent Labs  Lab 01/17/21 1456 01/18/21 0440 01/19/21 0535 01/20/21 0622  WBC 8.8 9.7 11.7* 15.1*  HGB 9.7* 9.2* 8.7* 8.8*  HCT 29.9* 28.7* 27.1* 27.9*  MCV 84.7 85.7 88.0 89.4  PLT 237 239 256 278      Urinalysis: No results for input(s): COLORURINE, LABSPEC, PHURINE,  GLUCOSEU, HGBUR, BILIRUBINUR, KETONESUR, PROTEINUR, UROBILINOGEN, NITRITE, LEUKOCYTESUR in the last 72 hours.  Invalid input(s): APPERANCEUR    Imaging: No results found.   Medications:    sodium chloride 10 mL/hr at 01/17/21 0427   levETIRAcetam 500 mg (01/22/21 0946)    (feeding supplement) PROSource Plus  30 mL Oral TID BM   amiodarone  200 mg Oral BID   apixaban  5 mg Oral BID   Chlorhexidine Gluconate Cloth  6 each Topical Daily   feeding supplement (NEPRO CARB STEADY)  237 mL Oral BID BM   heparin sodium (porcine)  2,000 Units Intracatheter Once   insulin aspart  0-15 Units Subcutaneous TID WC   insulin glargine-yfgn  15 Units Subcutaneous Daily   multivitamin  1 tablet Oral QHS   rosuvastatin  40 mg Oral QPM    Assessment/ Plan:     Principal Problem:   Acute delirium Active Problems:   ATN (acute tubular necrosis) (HCC)   Speech abnormality   Sepsis ([place])   Cerebrovascular accident (CVA) ([place])   Altered mental status   Sepsis due to gram-negative urinary tract infection ([place])   Sandra Massey is a 70 y.o. white female SNF resident with atrial fibrillation, diabetes mellitus type II, hypertension, CVA who is admitted to [facility] on 01/09/2021 for UTI (urinary tract  infection) [N39.0] Speech abnormality [R47.9] MRI contraindicated due to metal implant [Z53.09] Sepsis ([place]) [A41.9] Cerebrovascular accident (CVA), unspecified mechanism ([place]) [I63.9] AMS (altered mental status) [R41.82]  #1: End-stage renal disease: patient's family wants to pursue outpatient hemodialysis.  - consult vascular for tunneled dialysis catheter placement - hemodialysis treatment for tomorrow after tunneled catheter placed.   #2: Hypertension with chronic kidney disease: 114/52. Outpatient regimen of amlodipine. Currently not on any blood pressure agents. : Continue present antihypertensive medications for  #3: Diabetes mellitus type II with chronic kidney disease: insulin dependent.  History of poor control. Hemoglobin A1c is low due to renal insufficiency, 6.9%.   #4: Anemia with chronic kidney disease: hemoglobin 8.8.  - anticipate starting ESA with HD treatment.    LOS: [place], [facility] 11/14/20223:17 PM

## 2021-01-22 NOTE — H&P (View-Only) (Signed)
[facility] Daily Progress Note  01/14/21: Ultrasound guidance for vascular access Right vein Placement of a 30cm temporary trialysis dialysis catheter Right femoral vein  Subjective: Patient resting comfortably in bed.  No acute issues overnight.  Objective: Vitals:   01/22/21 0104 01/22/21 0822 01/22/21 1130 01/22/21 1421  BP: 135/74 135/78 130/73 (!) 114/52  Pulse: 79 71 77 63  Resp: 20 18 18 18   Temp: 97.8 F (36.6 C) 98.4 F (36.9 C) 97.8 F (36.6 C) 98.2 F (36.8 C)  TempSrc: Oral     SpO2: 92% 93% 91% 95%  Weight:      Height:        Intake/Output Summary (Last 24 hours) at 01/22/2021 1458 Last data filed at 01/22/2021 1345 Gross per 24 hour  Intake 600 ml  Output --  Net 600 ml   Physical Exam: A&Ox1, NAD CV: RRR Pulmonary: CTA Bilaterally Abdomen: Soft, Nontender, Nondistended Right groin:  Temporary dialysis catheter noted.  No swelling or drainage. Vascular: Warm distally to toes.   Laboratory: CBC    Component Value Date/Time   WBC 15.1 (H) 01/20/2021 0622   HGB 8.8 (L) 01/20/2021 0622   HCT 27.9 (L) 01/20/2021 0622   PLT 278 01/20/2021 0622   BMET    Component Value Date/Time   NA 135 01/22/2021 0936   K 4.6 01/22/2021 0936   CL 98 01/22/2021 0936   CO2 28 01/22/2021 0936   GLUCOSE 176 (H) 01/22/2021 0936   BUN 31 (H) 01/22/2021 0936   CREATININE 4.02 (H) 01/22/2021 0936   CALCIUM 8.8 (L) 01/22/2021 0936   GFRNONAA 12 (L) 01/22/2021 0936   GFRAA 55 (L) 06/05/2019 0423   Assessment/Planning: The patient is a 70 year old female with progressively worsening kidney disease now end-stage renal was being maintained via a temporary dialysis catheter on hemodialysis as an inpatient  1) asked by the nephrology service to place a PermCath to allow her to dialyze in the outpatient setting  2) I am currently trying to reach out to the patient's sister who is her medical decision making to get her consent for PermCath  placement tomorrow with Dr. Delana Meyer.  Unfortunately, I have not been able to reach her via of the numbers in epic.  Will continue to try.  Discussed with Dr. Delana Meyer  Addendum @ 330pm I was able to reach out to the patient's sister [name] who is also her roommate at her nursing facility through the assistance of Otila Kluver.  The patient's sister [name] consented to the placement of a PermCath.  The consent is in her bedside chart.  Marcelle Overlie PA-C 01/22/2021 2:58 PM

## 2021-01-22 NOTE — Progress Notes (Addendum)
[facility] Daily Progress Note  01/14/21: Ultrasound guidance for vascular access Right vein Placement of a 30cm temporary trialysis dialysis catheter Right femoral vein  Subjective: Patient resting comfortably in bed.  No acute issues overnight.  Objective: Vitals:   01/22/21 0104 01/22/21 0822 01/22/21 1130 01/22/21 1421  BP: 135/74 135/78 130/73 (!) 114/52  Pulse: 79 71 77 63  Resp: 20 18 18 18   Temp: 97.8 F (36.6 C) 98.4 F (36.9 C) 97.8 F (36.6 C) 98.2 F (36.8 C)  TempSrc: Oral     SpO2: 92% 93% 91% 95%  Weight:      Height:        Intake/Output Summary (Last 24 hours) at 01/22/2021 1458 Last data filed at 01/22/2021 1345 Gross per 24 hour  Intake 600 ml  Output --  Net 600 ml   Physical Exam: A&Ox1, NAD CV: RRR Pulmonary: CTA Bilaterally Abdomen: Soft, Nontender, Nondistended Right groin:  Temporary dialysis catheter noted.  No swelling or drainage. Vascular: Warm distally to toes.   Laboratory: CBC    Component Value Date/Time   WBC 15.1 (H) 01/20/2021 0622   HGB 8.8 (L) 01/20/2021 0622   HCT 27.9 (L) 01/20/2021 0622   PLT 278 01/20/2021 0622   BMET    Component Value Date/Time   NA 135 01/22/2021 0936   K 4.6 01/22/2021 0936   CL 98 01/22/2021 0936   CO2 28 01/22/2021 0936   GLUCOSE 176 (H) 01/22/2021 0936   BUN 31 (H) 01/22/2021 0936   CREATININE 4.02 (H) 01/22/2021 0936   CALCIUM 8.8 (L) 01/22/2021 0936   GFRNONAA 12 (L) 01/22/2021 0936   GFRAA 55 (L) 06/05/2019 0423   Assessment/Planning: The patient is a 70 year old female with progressively worsening kidney disease now end-stage renal was being maintained via a temporary dialysis catheter on hemodialysis as an inpatient  1) asked by the nephrology service to place a PermCath to allow her to dialyze in the outpatient setting  2) I am currently trying to reach out to the patient's sister who is her medical decision making to get her consent for PermCath  placement tomorrow with Dr. Delana Meyer.  Unfortunately, I have not been able to reach her via of the numbers in epic.  Will continue to try.  Discussed with Dr. Delana Meyer  Addendum @ 330pm I was able to reach out to the patient's sister [name] who is also her roommate at her nursing facility through the assistance of Otila Kluver.  The patient's sister [name] consented to the placement of a PermCath.  The consent is in her bedside chart.  Marcelle Overlie PA-C 01/22/2021 2:58 PM

## 2021-01-22 NOTE — Progress Notes (Addendum)
[facility]) Hospital Liaison Note  Notified by Pricilla Riffle, LCSW [facility] manager of patient/family request for [facility] Palliative services at facility after discharge.  [facility] hospital liaison will follow patient for discharge disposition.  Please call with any hospice or outpatient palliative care related questions.  Thank you for the opportunity to participate in this patient's care.  Sandra Massey, Sandra Massey, Sandra Massey [phone]

## 2021-01-22 NOTE — TOC Progression Note (Addendum)
Transition of Care [facility]) - Progression Note    Patient Details  Name: Sandra Massey MRN: [id number] Date of Birth: [birth year]  Transition of Care [facility]) CM/SW [place], [place] Phone Number: 01/22/2021, 1:45 PM  Clinical Narrative:     CSW spoke with Otila Kluver with [facility] at [phone] who was able ot walk her phone to [name] patient's sister and Media planner. CSW and Otila Kluver with [facility] explained options for permanent HD vs no HD and palliative/hospice. [name] reports at this time she'd like to do everything she can for her sister in "saving" her and would like her to do permanent outpatient HD and have palliative follow at dc.   CSW has informed treatment team and made referral to [facility] for outpatient palliative.   HD coordinator aware and working on outpatient HD placement near [facility].   TOC will continue to follow for discharge planning.    Expected Discharge Plan:  (TBD) Barriers to Discharge: Continued Medical Work up  Expected Discharge Plan and Services Expected Discharge Plan:  (TBD)       Living arrangements for the past 2 months: [place]                                       Social Determinants of Health (SDOH) Interventions    Readmission Risk Interventions No flowsheet data found.

## 2021-01-22 NOTE — Progress Notes (Signed)
PROGRESS NOTE  Sandra Massey    DOB: 1950/03/28, 70 y.o.  GBT:[id number]  PCP: [name], MD   Code Status: DNR   DOA: 01/09/2021   LOS: 3  Brief Narrative of Current Hospitalization  Sandra Massey is a 70 y.o. female with a PMH significant for Jehovah's Witness, CVA with residual right-sided hemiparesis, chronic A. fib, type II DM, HTN. They presented from SNF to the ED on 01/09/2021 with acute AMS. In the ED, it was found that they had no acute changes on head CT but abnormal electrical activity on EEG.  Neurology was consulted. They were treated with Keppra.  Additionally, patient was septic with blood cultures positive for E. coli.  She was started on IV ceftriaxone and nephrology was consulted. Patient was admitted to medicine service for further workup and management of sepsis with acute encephalopathy as outlined in detail below.  01/22/21 -stable  Assessment & Plan  Principal Problem:   Acute delirium Active Problems:   ATN (acute tubular necrosis) (HCC)   Speech abnormality   Sepsis (HCC)   Cerebrovascular accident (CVA) ([place])   Altered mental status   Sepsis due to gram-negative urinary tract infection ([place])  Sepsis secondary to E. coli bacteremia with acute encephalopathy- Again, unsure of patient's baseline.  Blood cultures positive for E. coli which was sensitive to cephalosporins.  She has completed treatment for her infection. Leukocytosis 11.7>15.1. Afebrile and asymptomatic. Will continue to monitor. Patient is stable to discharge once final decision can be made about long-term disposition of her dialysis.  -Ancef (11/7-11/12), per ID - CTX (11/3-11/6) - cefepime (11/2) - flagyl 11/2 - vancomycin 11/2 - PT/OT - CBC am  AKI on CKDIIIb now ESRD  hypokalemia-temporary dialysis catheter placed and received HD starting 11/6. TThS schedule. Creatinine not showing significant improvement/recovery. Baseline around 1.58. Will need clarification from her medical  decision maker (sister- [phone]) as to [place] with continuing long term dialysis. If patient is to continue on long-term HD, she will need a more permanent access inserted and set up outpatient HD. She could at that point be discharged to SNF. Patient still lacks capacity for decision making.  -Nephrology following, appreciate recommendations - appreciate the help of palliative and TOC in attempts to contact family for medical decision making decisions.  - RFP on HD days  Anemia of chronic disease- Patient is Jehova's witness. Hgb very gradually declining throughout hospitalization likely in part due to dilution with significant IV fluids. Hgb 9.2>8.7>8.8 - CBC on HD days  A. Fib  HTN-CHA2DS2-VASc score= 6.  Rate controlled. -Continue home Eliquis -Continue home amiodarone 200 mg twice daily  Type II DM- hgb A1c on admission 6.9. blood sugars stable. - continue mSSI -15 units semglee daily  Seizure prophylaxis- no active seizure activity or repetitive motions observed -Neurology following, appreciate recommendations - continue keppra  H/o CVA  HLD-residual right-sided hemiparesis and dysphagia - assist with feeds -Continue home rosuvastatin - psychiatry consulted-   DVT prophylaxis:   Heparin apixaban (ELIQUIS) tablet 5 mg   Diet:  Diet Orders (From admission, onward)     Start     Ordered   01/17/21 1025  Diet Carb Modified Fluid consistency: Thin; Room service appropriate? No  Diet effective now       Question Answer Comment  Diet-HS Snack? Nothing   Calorie Level Medium 1600-2000   Fluid consistency: Thin   Room service appropriate? No      01/17/21 1024  Subjective 01/22/21    Pt was mildly agitated this morning. I asked her if she could speak to me kindly instead and she agreed and then we had a nice conversation. She was just hungry and needed assistance getting her food. She has no other complaints at this time.   Disposition Plan &  Communication  Patient status: Inpatient  Admitted From: SNF Disposition: Skilled nursing facility Anticipated discharge date: TBD, pending return to baseline and decision on long-term dialysis placement  Family Communication: attempted multiple times to call all available phone numbers for family members (6+) and did not get ahold of anyone. Left a voicemail on only number that had answering machine requesting to come in to see patient and/or call to discuss.  Consults, Procedures, Significant Events  Consultants:  ID Neurology Nephrology  Psychiatry  Palliative   Procedures/significant events:  Temp cath, started HD 11/6  Antimicrobials:  Anti-infectives (From admission, onward)    Start     Dose/Rate Route Frequency Ordered Stop   01/19/21 1000  ceFAZolin (ANCEF) IVPB 1 g/50 mL premix        1 g 100 mL/hr over 30 Minutes Intravenous Every 12 hours 01/19/21 0735 01/20/21 2228   01/16/21 1800  ceFAZolin (ANCEF) IVPB 1 g/50 mL premix  Status:  Discontinued        1 g 100 mL/hr over 30 Minutes Intravenous Every 24 hours 01/15/21 1418 01/19/21 0735   01/15/21 1800  ceFAZolin (ANCEF) IVPB 2g/100 mL premix        2 g 200 mL/hr over 30 Minutes Intravenous  Once 01/15/21 1417 01/15/21 1822   01/15/21 0900  ceFAZolin (ANCEF) IVPB 1 g/50 mL premix  Status:  Discontinued        1 g 100 mL/hr over 30 Minutes Intravenous Every 24 hours 01/14/21 1502 01/15/21 1417   01/11/21 1300  vancomycin (VANCOREADY) IVPB 750 mg/150 mL  Status:  Discontinued        750 mg 150 mL/hr over 60 Minutes Intravenous Every 24 hours 01/10/21 0235 01/10/21 1116   01/11/21 0800  vancomycin (VANCOREADY) IVPB 1500 mg/300 mL  Status:  Discontinued        1,500 mg 150 mL/hr over 120 Minutes Intravenous Every 48 hours 01/10/21 1116 01/10/21 1448   01/11/21 0800  cefTRIAXone (ROCEPHIN) 2 g in sodium chloride 0.9 % 100 mL IVPB  Status:  Discontinued        2 g 200 mL/hr over 30 Minutes Intravenous Every 24 hours  01/10/21 1448 01/14/21 1502   01/10/21 1200  ceFEPIme (MAXIPIME) 2 g in sodium chloride 0.9 % 100 mL IVPB  Status:  Discontinued        2 g 200 mL/hr over 30 Minutes Intravenous Every 12 hours 01/10/21 0219 01/10/21 1448   01/09/21 2245  metroNIDAZOLE (FLAGYL) IVPB 500 mg  Status:  Discontinued        500 mg 100 mL/hr over 60 Minutes Intravenous 2 times daily 01/09/21 2228 01/10/21 1448   01/09/21 2245  vancomycin (VANCOREADY) IVPB 2000 mg/400 mL        2,000 mg 200 mL/hr over 120 Minutes Intravenous  Once 01/09/21 2234 01/10/21 0708   01/09/21 2230  ceFEPIme (MAXIPIME) 2 g in sodium chloride 0.9 % 100 mL IVPB        2 g 200 mL/hr over 30 Minutes Intravenous  Once 01/09/21 2228 01/10/21 0204   01/09/21 2230  vancomycin (VANCOCIN) IVPB 1000 mg/200 mL premix  Status:  Discontinued  1,000 mg 200 mL/hr over 60 Minutes Intravenous  Once 01/09/21 2228 01/09/21 2234       Objective   Vitals:   01/21/21 1151 01/21/21 1615 01/21/21 1931 01/22/21 0104  BP: 138/74 140/70 117/63 135/74  Pulse: 88 90 (!) 56 79  Resp: 18 18 20 20   Temp: 97.9 F (36.6 C) 98 F (36.7 C) 97.7 F (36.5 C) 97.8 F (36.6 C)  TempSrc:   Oral Oral  SpO2: 93% 95% 92% 92%  Weight:      Height:        Intake/Output Summary (Last 24 hours) at 01/22/2021 0715 Last data filed at 01/21/2021 1900 Gross per 24 hour  Intake 820 ml  Output --  Net 820 ml    Filed Weights   01/09/21 1352 01/16/21 0900 01/20/21 0451  Weight: 136.1 kg 111.1 kg 116.6 kg    Patient BMI: Body mass index is 40.25 kg/m.   Physical Exam: General: awake, alert, NAD HEENT: atraumatic, clear conjunctiva, anicteric sclera, moist mucus membranes, hearing grossly normal Respiratory: normal respiratory effort. Cardiovascular: normal S1/S2,  RRR, no JVD, murmurs, rubs, gallops, quick capillary refill  Gastrointestinal: soft, NT, ND, no HSM felt Nervous: Alert, oriented to self, location.  Answers questions with simple answers but unable  to express understanding of her condition or consequences of therapy/withholding therapy Extremities: non-pitting LE edema, normal tone Skin: dry, intact, normal temperature, normal color, No rashes, lesions or ulcers on exposed skin  Labs   I have personally reviewed following labs and imaging studies BMP Latest Ref Rng & Units 01/20/2021 01/19/2021 01/18/2021  Glucose 70 - 99 mg/dL 208(H) 216(H) 278(H)  BUN 8 - 23 mg/dL 31(H) 20 37(H)  Creatinine 0.44 - 1.00 mg/dL 3.81(H) 2.79(H) 3.97(H)  Sodium 135 - 145 mmol/L 136 136 134(L)  Potassium 3.5 - 5.1 mmol/L 3.7 3.6 3.3(L)  Chloride 98 - 111 mmol/L 100 100 99  CO2 22 - 32 mmol/L 28 28 27   Calcium 8.9 - 10.3 mg/dL 8.5(L) 8.0(L) 8.0(L)   CBC    Component Value Date/Time   WBC 15.1 (H) 01/20/2021 0622   RBC 3.12 (L) 01/20/2021 0622   HGB 8.8 (L) 01/20/2021 0622   HCT 27.9 (L) 01/20/2021 0622   PLT 278 01/20/2021 0622   MCV 89.4 01/20/2021 0622   MCH 28.2 01/20/2021 0622   MCHC 31.5 01/20/2021 0622   RDW 18.4 (H) 01/20/2021 0622   LYMPHSABS 1.3 01/10/2021 0009   MONOABS 1.2 (H) 01/10/2021 0009   EOSABS 0.0 01/10/2021 0009   BASOSABS 0.1 01/10/2021 0009   Imaging Studies  No results found. Medications   Scheduled Meds:  (feeding supplement) PROSource Plus  30 mL Oral TID BM   amiodarone  200 mg Oral BID   apixaban  5 mg Oral BID   Chlorhexidine Gluconate Cloth  6 each Topical Daily   feeding supplement (NEPRO CARB STEADY)  237 mL Oral BID BM   heparin sodium (porcine)  2,000 Units Intracatheter Once   insulin aspart  0-15 Units Subcutaneous TID WC   insulin glargine-yfgn  15 Units Subcutaneous Daily   multivitamin  1 tablet Oral QHS   rosuvastatin  40 mg Oral QPM   No recently discontinued medications to reconcile  LOS: 13 days   Time spent: >25min  Faiz Weber L Onetta Spainhower, DO [facility] 01/22/2021, 7:15 AM   Please refer to amion to contact the [facility] Attending or Consulting provider for this  pt  [email or web address] Available by Epic secure chat 7AM-7PM. If  7PM-7AM, please contact night-coverage

## 2021-01-23 ENCOUNTER — Encounter
Admission: EM | Disposition: A | Discharge status: Skilled Nursing Facility | Payer: Medicare HMO | Source: Skilled Nursing Facility | Attending: Student in an Organized Health Care Education/Training Program

## 2021-01-23 DIAGNOSIS — N186 End stage renal disease: Secondary | ICD-10-CM

## 2021-01-23 DIAGNOSIS — N179 Acute kidney failure, unspecified: Secondary | ICD-10-CM

## 2021-01-23 DIAGNOSIS — N185 Chronic kidney disease, stage 5: Secondary | ICD-10-CM

## 2021-01-23 HISTORY — PX: DIALYSIS/PERMA CATHETER INSERTION: CATH118288

## 2021-01-23 LAB — GLUCOSE, CAPILLARY
Glucose-Capillary: 128 mg/dL — ABNORMAL HIGH (ref 70–99)
Glucose-Capillary: 92 mg/dL (ref 70–99)
Glucose-Capillary: 91 mg/dL (ref 70–99)
Glucose-Capillary: 157 mg/dL — ABNORMAL HIGH (ref 70–99)
Glucose-Capillary: 158 mg/dL — ABNORMAL HIGH (ref 70–99)

## 2021-01-23 LAB — RENAL FUNCTION PANEL
Albumin: 2 g/dL — ABNORMAL LOW (ref 3.5–5.0)
Anion gap: 9 (ref 5–15)
BUN: 41 mg/dL — ABNORMAL HIGH (ref 8–23)
CO2: 28 mmol/L (ref 22–32)
Calcium: 8.5 mg/dL — ABNORMAL LOW (ref 8.9–10.3)
Chloride: 98 mmol/L (ref 98–111)
Creatinine, Ser: 4.73 mg/dL — ABNORMAL HIGH (ref 0.44–1.00)
GFR, Estimated: 10 mL/min — ABNORMAL LOW (ref >60)
Glucose, Bld: 135 mg/dL — ABNORMAL HIGH (ref 70–99)
Phosphorus: 6 mg/dL — ABNORMAL HIGH (ref 2.5–4.6)
Potassium: 4.9 mmol/L (ref 3.5–5.1)
Sodium: 135 mmol/L (ref 135–145)

## 2021-01-23 LAB — HEPATITIS B CORE ANTIBODY, TOTAL: Hep B Core Total Ab: NONREACTIVE

## 2021-01-23 LAB — CBC
HCT: 26.6 % — ABNORMAL LOW (ref 36.0–46.0)
Hemoglobin: 8.4 g/dL — ABNORMAL LOW (ref 12.0–15.0)
MCH: 28.3 pg (ref 26.0–34.0)
MCHC: 31.6 g/dL (ref 30.0–36.0)
MCV: 89.6 fL (ref 80.0–100.0)
Platelets: 358 10*3/uL (ref 150–400)
RBC: 2.97 MIL/uL — ABNORMAL LOW (ref 3.87–5.11)
RDW: 18.5 % — ABNORMAL HIGH (ref 11.5–15.5)
WBC: 12.4 10*3/uL — ABNORMAL HIGH (ref 4.0–10.5)
nRBC: 0 % (ref 0.0–0.2)

## 2021-01-23 SURGERY — DIALYSIS/PERMA CATHETER INSERTION
Anesthesia: Moderate Sedation

## 2021-01-23 MED ORDER — APIXABAN 5 MG PO TABS
5.0000 mg | ORAL_TABLET | Freq: Two times a day (BID) | ORAL | Status: DC
  Administered 2021-01-24 – 2021-01-29 (×10): 5 mg via ORAL
  Filled 2021-01-23 (×10): qty 1

## 2021-01-23 MED ORDER — HYDROMORPHONE HCL 1 MG/ML IJ SOLN
1.0000 mg | Freq: Once | INTRAMUSCULAR | Status: AC | PRN
  Administered 2021-01-26: 13:00:00 1 mg via INTRAVENOUS
  Filled 2021-01-23: qty 1

## 2021-01-23 MED ORDER — LEVETIRACETAM IN NACL 500 MG/100ML IV SOLN
500.0000 mg | INTRAVENOUS | Status: AC
  Administered 2021-01-24 – 2021-01-26 (×3): 500 mg via INTRAVENOUS
  Filled 2021-01-23 (×3): qty 100

## 2021-01-23 MED ORDER — METHYLPREDNISOLONE SODIUM SUCC 125 MG IJ SOLR: 125.0000 mg | Freq: Once | INTRAMUSCULAR | Status: DC | PRN

## 2021-01-23 MED ORDER — EPOETIN ALFA 10000 UNIT/ML IJ SOLN
10000.0000 [IU] | INTRAMUSCULAR | Status: DC
  Administered 2021-01-23 – 2021-01-27 (×3): 10000 [IU] via INTRAVENOUS
  Filled 2021-01-23 (×4): qty 1

## 2021-01-23 MED ORDER — DIPHENHYDRAMINE HCL 50 MG/ML IJ SOLN: 50.0000 mg | Freq: Once | INTRAMUSCULAR | Status: DC | PRN

## 2021-01-23 MED ORDER — LEVETIRACETAM 500 MG/5ML IV SOLN
250.0000 mg | INTRAVENOUS | Status: DC | PRN
Start: 2021-01-24 — End: 2021-01-25
  Filled 2021-01-23: qty 2.5

## 2021-01-23 MED ORDER — ROSUVASTATIN CALCIUM 10 MG PO TABS
10.0000 mg | ORAL_TABLET | Freq: Every evening | ORAL | Status: DC
  Administered 2021-01-23 – 2021-01-28 (×6): 10 mg via ORAL
  Filled 2021-01-23 (×6): qty 1

## 2021-01-23 MED ORDER — MIDAZOLAM HCL 2 MG/2ML IJ SOLN
INTRAMUSCULAR | Status: DC | PRN
  Administered 2021-01-23: 2 mg via INTRAVENOUS

## 2021-01-23 MED ORDER — ONDANSETRON HCL 4 MG/2ML IJ SOLN
4.0000 mg | Freq: Four times a day (QID) | INTRAMUSCULAR | Status: DC | PRN
  Administered 2021-01-26: 4 mg via INTRAVENOUS
  Filled 2021-01-23: qty 2

## 2021-01-23 MED ORDER — EPOETIN ALFA 4000 UNIT/ML IJ SOLN
INTRAMUSCULAR | Status: AC
  Filled 2021-01-23: qty 3

## 2021-01-23 MED ORDER — HEPARIN SODIUM (PORCINE) 1000 UNIT/ML IJ SOLN
INTRAMUSCULAR | Status: AC
  Filled 2021-01-23: qty 1

## 2021-01-23 MED ORDER — FAMOTIDINE 20 MG PO TABS: 40.0000 mg | ORAL_TABLET | Freq: Once | ORAL | Status: DC | PRN

## 2021-01-23 MED ORDER — SODIUM CHLORIDE 0.9 % IV SOLN: INTRAVENOUS | Status: DC

## 2021-01-23 MED ORDER — MIDAZOLAM HCL 2 MG/ML PO SYRP
8.0000 mg | ORAL_SOLUTION | Freq: Once | ORAL | Status: DC | PRN
  Filled 2021-01-23: qty 4

## 2021-01-23 MED ORDER — FENTANYL CITRATE (PF) 100 MCG/2ML IJ SOLN
INTRAMUSCULAR | Status: DC | PRN
  Administered 2021-01-23: 25 ug via INTRAVENOUS

## 2021-01-23 MED ORDER — MIDAZOLAM HCL 2 MG/2ML IJ SOLN
INTRAMUSCULAR | Status: AC
  Filled 2021-01-23: qty 4

## 2021-01-23 MED ORDER — FENTANYL CITRATE (PF) 100 MCG/2ML IJ SOLN
INTRAMUSCULAR | Status: AC
  Filled 2021-01-23: qty 2

## 2021-01-23 MED ORDER — CEFAZOLIN SODIUM-DEXTROSE 1-4 GM/50ML-% IV SOLN
INTRAVENOUS | Status: AC
  Filled 2021-01-23: qty 50

## 2021-01-23 MED ORDER — CEFAZOLIN SODIUM-DEXTROSE 1-4 GM/50ML-% IV SOLN: 1.0000 g | Freq: Once | INTRAVENOUS | Status: DC

## 2021-01-23 MED ORDER — CEFAZOLIN SODIUM-DEXTROSE 1-4 GM/50ML-% IV SOLN
1.0000 g | Freq: Once | INTRAVENOUS | Status: AC
  Administered 2021-01-23: 1 g via INTRAVENOUS

## 2021-01-23 SURGICAL SUPPLY — 14 items
ADH SKN CLS APL DERMABOND .7 (GAUZE/BANDAGES/DRESSINGS) ×1
BIOPATCH RED 1 DISK 7.0 (GAUZE/BANDAGES/DRESSINGS) ×1 IMPLANT
CATH CANNON HEMO 15FR 19 (HEMODIALYSIS SUPPLIES) ×1 IMPLANT
COVER PROBE U/S 5X48 (MISCELLANEOUS) ×1 IMPLANT
DERMABOND ADVANCED (GAUZE/BANDAGES/DRESSINGS) ×1
DERMABOND ADVANCED .7 DNX12 (GAUZE/BANDAGES/DRESSINGS) IMPLANT
DRAPE INCISE IOBAN 66X45 STRL (DRAPES) ×1 IMPLANT
GAUZE SPONGE 4X4 12PLY STRL (GAUZE/BANDAGES/DRESSINGS) ×1 IMPLANT
NDL ENTRY 21GA 7CM ECHOTIP (NEEDLE) IMPLANT
NEEDLE ENTRY 21GA 7CM ECHOTIP (NEEDLE) ×2 IMPLANT
PACK ANGIOGRAPHY (CUSTOM PROCEDURE TRAY) ×1 IMPLANT
SET INTRO CAPELLA COAXIAL (SET/KITS/TRAYS/PACK) ×1 IMPLANT
SUT MNCRL AB 4-0 PS2 18 (SUTURE) ×1 IMPLANT
SUT SILK 0 FSL (SUTURE) ×1 IMPLANT

## 2021-01-23 NOTE — Progress Notes (Signed)
[facility]  PROGRESS NOTE   Subjective:   Seen and examined on hemodialysis treatment. Tolerating treatment well. Patient in bed.     HEMODIALYSIS FLOWSHEET:  Blood Flow Rate (mL/min): 400 mL/min Arterial Pressure (mmHg): -150 mmHg Venous Pressure (mmHg): 160 mmHg Transmembrane Pressure (mmHg): 50 mmHg Ultrafiltration Rate (mL/min): 330 mL/min Dialysate Flow Rate (mL/min): 500 ml/min Conductivity: Machine : 14.2 Conductivity: Machine : 14.2 Dialysis Fluid Bolus: Normal Saline Bolus Amount (mL): 250 mL   Objective:  Vital signs in last 24 hours:  Temp:  [97.2 F (36.2 C)-98.2 F (36.8 C)] 97.8 F (36.6 C) (11/15 0845) Pulse Rate:  [60-96] 73 (11/15 1030) Resp:  [14-22] 14 (11/15 1030) BP: (104-139)/(52-106) 111/70 (11/15 1030) SpO2:  [87 %-100 %] 100 % (11/15 1030) Weight:  [113.9 kg] 113.9 kg (11/15 0845)  Weight change:  Filed Weights   01/20/21 0451 01/23/21 0500 01/23/21 0845  Weight: 116.6 kg 113.9 kg 113.9 kg    Intake/Output: I/O last 3 completed shifts: In: 880 [P.O.:480; IV Piggyback:400] Out: -    Intake/Output this shift:  No intake/output data recorded.  Physical Exam: General:  No acute distress, sitting up in bed.   Head:  Normocephalic, atraumatic. Moist oral mucosal membranes  Eyes:  Anicteric  Neck:  Supple  Lungs:   Clear to auscultation, normal effort  Heart:  regular  Abdomen:   Soft, nontender, bowel sounds present  Extremities:  peripheral edema.  Neurologic:  Awake, alert, following commands  Skin:  No lesions  Access: Right femoral temp HD catheter    Basic Metabolic Panel: Recent Labs  Lab 01/18/21 0440 01/19/21 0535 01/20/21 0622 01/22/21 0936 01/23/21 0857  NA 134* 136 136 135 135  K 3.3* 3.6 3.7 4.6 4.9  CL 99 100 100 98 98  CO2 27 28 28 28 28   GLUCOSE 278* 216* 208* 176* 135*  BUN 37* 20 31* 31* 41*  CREATININE 3.97* 2.79* 3.81* 4.02* 4.73*  CALCIUM 8.0* 8.0* 8.5* 8.8* 8.5*  PHOS  --   --   --   5.1* 6.0*     CBC: Recent Labs  Lab 01/17/21 1456 01/18/21 0440 01/19/21 0535 01/20/21 0622 01/23/21 0857  WBC 8.8 9.7 11.7* 15.1* 12.4*  HGB 9.7* 9.2* 8.7* 8.8* 8.4*  HCT 29.9* 28.7* 27.1* 27.9* 26.6*  MCV 84.7 85.7 88.0 89.4 89.6  PLT 237 239 256 278 358      Urinalysis: No results for input(s): COLORURINE, LABSPEC, PHURINE, GLUCOSEU, HGBUR, BILIRUBINUR, KETONESUR, PROTEINUR, UROBILINOGEN, NITRITE, LEUKOCYTESUR in the last 72 hours.  Invalid input(s): APPERANCEUR    Imaging: No results found.   Medications:    sodium chloride 10 mL/hr at 01/17/21 0427   levETIRAcetam Stopped (01/22/21 2140)    (feeding supplement) PROSource Plus  30 mL Oral TID BM   amiodarone  200 mg Oral BID   apixaban  5 mg Oral BID   Chlorhexidine Gluconate Cloth  6 each Topical Daily   feeding supplement (NEPRO CARB STEADY)  237 mL Oral BID BM   heparin sodium (porcine)  2,000 Units Intracatheter Once   insulin aspart  0-15 Units Subcutaneous TID WC   insulin glargine-yfgn  15 Units Subcutaneous Daily   multivitamin  1 tablet Oral QHS   rosuvastatin  40 mg Oral QPM    Assessment/ Plan:     Principal Problem:   Acute delirium Active Problems:   ATN (acute tubular necrosis) (HCC)   Speech abnormality   Sepsis (HCC)   Cerebrovascular accident (CVA) (  [place])   Altered mental status   Sepsis due to gram-negative urinary tract infection ([place])   [name] is a 70 y.o. white female SNF resident with atrial fibrillation, diabetes mellitus type II, hypertension, CVA who is admitted to [facility] on 01[facility]09/2021 for UTI (urinary tract infection) [N39.0] Speech abnormality [R47.9] MRI contraindicated due to metal implant [Z53.09] Sepsis ([place]) [A41.9] Cerebrovascular accident (CVA), unspecified mechanism ([place]) [I63.9] AMS (altered mental status) [R41.82]  #1: End-stage renal disease: patient's family wants to pursue outpatient hemodialysis.  - consult vascular for tunneled dialysis catheter  placement for later today.  - initiate outpatient planning. Plan for Sandra Massey.   #2: Hypertension with chronic kidney disease: 111/53. Outpatient regimen of amlodipine. Currently not on any blood pressure agents.  #3: Diabetes mellitus type II with chronic kidney disease: insulin dependent. History of poor control. Hemoglobin A1c is low due to renal insufficiency, 6.9%.  - Continue glucose control  #4: Anemia with chronic kidney disease: hemoglobin 8.4.  - start EPO with dialysis treatment.    LOS: [place], [facility] 11/15/202210:47 AM

## 2021-01-23 NOTE — Progress Notes (Signed)
Patient is alert and oriented with no complaints and a completed net UF goal of 0.5.  Patient has been sleeping on and off majority  of treatment, RN Lorriane Shire ) aware

## 2021-01-23 NOTE — Progress Notes (Signed)
Working on setting patient up for outpatient dialysis at [facility]). Patient will need to be able to tolerate sitting in a chair for at least 4 hours and able to transport in a wheelchair. Patient will probably require a hoyer pad to assist with transfers at the dialysis center. Please contact me with any dialysis placement concerns.  Elvera Bicker Dialysis Coordinator [phone]

## 2021-01-23 NOTE — Progress Notes (Signed)
PROGRESS NOTE  Sandra Massey    DOB: [birth year], 70 y.o.  PJA:[id number]  PCP: [name], MD   Code Status: DNR   DOA: 01/09/2021   LOS: 11  Brief Narrative of Current Hospitalization  Sandra Massey is a 70 y.o. female with a PMH significant for Jehovah's Witness, CVA with residual right-sided hemiparesis, chronic A. fib, type II DM, HTN. They presented from SNF to the ED on 01/09/2021 with acute AMS. In the ED, it was found that they had no acute changes on head CT but abnormal electrical activity on EEG.  Neurology was consulted. They were treated with Keppra.  Additionally, patient was septic with blood cultures positive for E. coli.  She was started on IV ceftriaxone and nephrology was consulted. Patient was admitted to medicine service for further workup and management of sepsis with acute encephalopathy as outlined in detail below.  01/23/21 -stable  Assessment & Plan  Principal Problem:   Acute delirium Active Problems:   ATN (acute tubular necrosis) (HCC)   Speech abnormality   Sepsis ([place])   Cerebrovascular accident (CVA) ([place])   Altered mental status   Sepsis due to gram-negative urinary tract infection ([place])  Sepsis secondary to E. coli bacteremia with acute encephalopathy- resolved. She has completed treatment for her infection. Afebrile and asymptomatic. At baseline mental status. Patient is stable to discharge. 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 TThS schedule. Creatinine not showing significant improvement/recovery. Baseline around 1.58 prior to admission.  Patient still lacks capacity for decision making. Sister is her decision maker.  -Nephrology following, appreciate recommendations - RFP on HD days -Vascular to place tunneled catheter 11/15 -Pursuing establishment of outpatient HD once  patient can tolerate HD in chair for 4 hours and can be [facility] with her sister when arranged  Anemia of chronic disease- Patient is Jehova's witness. Hgb very gradually declining throughout hospitalization likely in part due to dilution with significant IV fluids. Hgb 9.2>8.7>8.8 - CBC on HD days  A. Fib  HTN-CHA2DS2-VASc score= 6.  Rate controlled. -Continue home Eliquis -Continue home amiodarone 200 mg twice daily  Type II DM- hgb A1c on admission 6.9. blood sugars stable. - continue mSSI -15 units semglee daily  Seizure prophylaxis- no active seizure activity or repetitive motions observed -Neurology following, appreciate recommendations - continue keppra  H/o CVA  HLD-residual right-sided hemiparesis and dysphagia - assist with feeds -Continue home rosuvastatin - psychiatry consulted-   DVT prophylaxis:   Heparin apixaban (ELIQUIS) tablet 5 mg   Diet:  Diet Orders (From admission, onward)     Start     Ordered   01/23/21 0001  Diet NPO time specified  Diet effective midnight        01/22/21 1445            Subjective 01/23/21    Pt reports no change today. Tolerating HD well.  Disposition Plan & Communication  Patient status: Inpatient  Admitted From: SNF Disposition: Skilled nursing facility Anticipated discharge date: 11/17  Family Communication: Sister has been updated Consults, Procedures, Significant Events  Consultants:  ID Neurology Nephrology  Psychiatry  Palliative   Procedures/significant events:  Temp cath, started HD 11/6 Tunnel cath 11/15  Antimicrobials:  Anti-infectives (From admission, onward)    Start     Dose/Rate Route Frequency Ordered Stop  01/19/21 1000  ceFAZolin (ANCEF) IVPB 1 g/50 mL premix        1 g 100 mL/hr over 30 Minutes Intravenous Every 12 hours 01/19/21 0735 01/20/21 2228   01/16/21 1800  ceFAZolin (ANCEF) IVPB 1 g/50 mL premix  Status:  Discontinued        1 g 100 mL/hr over 30 Minutes  Intravenous Every 24 hours 01/15/21 1418 01/19/21 0735   01/15/21 1800  ceFAZolin (ANCEF) IVPB 2g/100 mL premix        2 g 200 mL/hr over 30 Minutes Intravenous  Once 01/15/21 1417 01/15/21 1822   01/15/21 0900  ceFAZolin (ANCEF) IVPB 1 g/50 mL premix  Status:  Discontinued        1 g 100 mL/hr over 30 Minutes Intravenous Every 24 hours 01/14/21 1502 01/15/21 1417   01/11/21 1300  vancomycin (VANCOREADY) IVPB 750 mg/150 mL  Status:  Discontinued        750 mg 150 mL/hr over 60 Minutes Intravenous Every 24 hours 01/10/21 0235 01/10/21 1116   01/11/21 0800  vancomycin (VANCOREADY) IVPB 1500 mg/300 mL  Status:  Discontinued        1,500 mg 150 mL/hr over 120 Minutes Intravenous Every 48 hours 01/10/21 1116 01/10/21 1448   01/11/21 0800  cefTRIAXone (ROCEPHIN) 2 g in sodium chloride 0.9 % 100 mL IVPB  Status:  Discontinued        2 g 200 mL/hr over 30 Minutes Intravenous Every 24 hours 01/10/21 1448 01/14/21 1502   01/10/21 1200  ceFEPIme (MAXIPIME) 2 g in sodium chloride 0.9 % 100 mL IVPB  Status:  Discontinued        2 g 200 mL/hr over 30 Minutes Intravenous Every 12 hours 01/10/21 0219 01/10/21 1448   01/09/21 2245  metroNIDAZOLE (FLAGYL) IVPB 500 mg  Status:  Discontinued        500 mg 100 mL/hr over 60 Minutes Intravenous 2 times daily 01/09/21 2228 01/10/21 1448   01/09/21 2245  vancomycin (VANCOREADY) IVPB 2000 mg/400 mL        2,000 mg 200 mL/hr over 120 Minutes Intravenous  Once 01/09/21 2234 01/10/21 0708   01/09/21 2230  ceFEPIme (MAXIPIME) 2 g in sodium chloride 0.9 % 100 mL IVPB        2 g 200 mL/hr over 30 Minutes Intravenous  Once 01/09/21 2228 01/10/21 0204   01/09/21 2230  vancomycin (VANCOCIN) IVPB 1000 mg/200 mL premix  Status:  Discontinued        1,000 mg 200 mL/hr over 60 Minutes Intravenous  Once 01/09/21 2228 01/09/21 2234       Objective   Vitals:   01/23/21 0056 01/23/21 0500 01/23/21 0508 01/23/21 0510  BP: 104/75  114/60   Pulse: 68  67   Resp: 14  16    Temp: (!) 97.2 F (36.2 C)  (!) 97.4 F (36.3 C)   TempSrc: Axillary     SpO2: 92%  (!) 87% 93%  Weight:  113.9 kg    Height:        Intake/Output Summary (Last 24 hours) at 01/23/2021 0738 Last data filed at 01/22/2021 2319 Gross per 24 hour  Intake 880 ml  Output --  Net 880 ml    Filed Weights   01/16/21 0900 01/20/21 0451 01/23/21 0500  Weight: 111.1 kg 116.6 kg 113.9 kg    Patient BMI: Body mass index is 39.31 kg/m.   Physical Exam: General: awake, alert, NAD HEENT: atraumatic, clear conjunctiva, anicteric  sclera, moist mucus membranes, hearing grossly normal Respiratory: normal respiratory effort. Cardiovascular: normal S1/S2,  RRR, no JVD, murmurs, rubs, gallops, quick capillary refill  Gastrointestinal: soft, NT, ND, no HSM felt Nervous: Alert, oriented to self, location.  Answers questions with simple answers but unable to express understanding of her condition or consequences of therapy/withholding therapy Extremities: non-pitting LE edema, normal tone Skin: dry, intact, normal temperature, normal color, No rashes, lesions or ulcers on exposed skin  Labs   I have personally reviewed following labs and imaging studies BMP Latest Ref Rng & Units 01/22/2021 01/20/2021 01/19/2021  Glucose 70 - 99 mg/dL 176(H) 208(H) 216(H)  BUN 8 - 23 mg/dL 31(H) 31(H) 20  Creatinine 0.44 - 1.00 mg/dL 4.02(H) 3.81(H) 2.79(H)  Sodium 135 - 145 mmol/L 135 136 136  Potassium 3.5 - 5.1 mmol/L 4.6 3.7 3.6  Chloride 98 - 111 mmol/L 98 100 100  CO2 22 - 32 mmol/L 28 28 28   Calcium 8.9 - 10.3 mg/dL 8.8(L) 8.5(L) 8.0(L)   CBC    Component Value Date/Time   WBC 15.1 (H) 01/20/2021 0622   RBC 3.12 (L) 01/20/2021 0622   HGB 8.8 (L) 01/20/2021 0622   HCT 27.9 (L) 01/20/2021 0622   PLT 278 01/20/2021 0622   MCV 89.4 01/20/2021 0622   MCH 28.2 01/20/2021 0622   MCHC 31.5 01/20/2021 0622   RDW 18.4 (H) 01/20/2021 0622   LYMPHSABS 1.3 01/10/2021 0009   MONOABS 1.2 (H) 01/10/2021 0009    EOSABS 0.0 01/10/2021 0009   BASOSABS 0.1 01/10/2021 0009   Imaging Studies  No results found. Medications   Scheduled Meds:  (feeding supplement) PROSource Plus  30 mL Oral TID BM   amiodarone  200 mg Oral BID   apixaban  5 mg Oral BID   Chlorhexidine Gluconate Cloth  6 each Topical Daily   feeding supplement (NEPRO CARB STEADY)  237 mL Oral BID BM   heparin sodium (porcine)  2,000 Units Intracatheter Once   insulin aspart  0-15 Units Subcutaneous TID WC   insulin glargine-yfgn  15 Units Subcutaneous Daily   multivitamin  1 tablet Oral QHS   rosuvastatin  40 mg Oral QPM   No recently discontinued medications to reconcile  LOS: 14 days   Time spent: >48min  Odie Rauen L Messiah Rovira, DO [facility] 01/23/2021, 7:38 AM   Please refer to amion to contact the [facility] Attending or Consulting provider for this pt  [email or web address] Available by Epic secure chat 7AM-7PM. If 7PM-7AM, please contact night-coverage

## 2021-01-23 NOTE — Progress Notes (Signed)
OT Cancellation Note  Patient Details Name: KAETLIN BULLEN MRN: [id number] DOB: [birth year]   Cancelled Treatment:    Reason Eval/Treat Not Completed: Patient at procedure or test/ unavailable. Pt noted to be off the floor for HD, unavailable at this time. Will continue to follow POC at later date/time as pt available.   Dessie Coma, M.S. OTR/L  01/23/21, 9:02 AM  ascom [phone]

## 2021-01-23 NOTE — Op Note (Signed)
OPERATIVE NOTE   PROCEDURE: Insertion of tunneled dialysis catheter right IJ approach with ultrasound and fluoroscopic guidance.  PRE-OPERATIVE DIAGNOSIS: Acute on chronic renal insufficiency requiring hemodialysis  POST-OPERATIVE DIAGNOSIS: Same  SURGEON: Hortencia Pilar.  ANESTHESIA: Conscious sedation was administered under my direct supervision by the interventional radiology RN. IV Versed plus fentanyl were utilized. Continuous ECG, pulse oximetry and blood pressure was monitored throughout the entire procedure. Conscious sedation was for a total of 23 minutes and 57 seconds.  ESTIMATED BLOOD LOSS: Minimal cc  CONTRAST USED:  None  FLUOROSCOPY TIME: 0.2 minutes  INDICATIONS:   Sandra Massey a 70 y.o. y.o. female who presents with acute on chronic renal insufficiency.  She is now been started on hemodialysis and requires this is a life-sustaining therapy.  Currently she is maintained with a temporary catheter.  Tunneled catheter is now being inserted as a more permanent form of dialysis access.  Risks and benefits of been reviewed all questions been answered all are in agreement with proceeding.  DESCRIPTION: After obtaining full informed written consent, the patient was positioned supine. The right neck and chest wall was prepped and draped in a sterile fashion. Ultrasound was placed in a sterile sleeve. Ultrasound was utilized to identify the right internal jugular vein which is noted to be echolucent and compressible indicating patency. Image is recorded for the permanent record. Under direct ultrasound visualization a micro-needle is inserted into the vein followed by the micro-wire. Micro-sheath was then advanced and a J wire is inserted without difficulty under fluoroscopic guidance. Small counterincision was made at the wire insertion site. Dilators are passed over the wire and the tunneled dialysis catheter is fed into the central venous system without difficulty.  Under  fluoroscopy the catheter tip positioned at the atrial caval junction. The catheter is then approximated to the right chest wall and an exit site selected. 1% lidocaine is infiltrated in soft tissues at this level small incision is made and the tunneling device is then passed from the exit site to the right neck counterincision. Catheter is then connected to the tunneling device and the catheter was pulled subcutaneously. It is then transected and the hub assembly connected without difficulty. Both lumens aspirate and flush easily. After verification of smooth contour with proper tip position under fluoroscopy the catheter is packed with 5000 units of heparin per lumen.  Catheter secured to the skin of the right chest wall with 0 silk. A sterile dressing is applied with a Biopatch.  COMPLICATIONS: None  CONDITION: Good  Hortencia Pilar [place] [facility]. Office:  [phone]   01/23/2021,2:58 PM

## 2021-01-23 NOTE — Progress Notes (Signed)
PT Cancellation Note  Patient Details Name: ALIZABETH ANTONIO MRN: [id number] DOB: [birth year]   Cancelled Treatment:     Pt off floor receiving Insertion of tunneled dialysis catheter right IJ approach.  Continue per POC next available time/date.   Josie Dixon 01/23/2021, 3:09 PM

## 2021-01-23 NOTE — Progress Notes (Signed)
   01/20/21 0945 01/20/21 1000 01/20/21 1015  Vitals  BP 123/67 (!) 110/57 (!) 122/58  MAP (mmHg) 78 75 78  Pulse Rate 77 77 73  ECG Heart Rate 71 83 71  Resp (!) 21 (!) 25 20  During Hemodialysis Assessment  Blood Flow Rate (mL/min) 400 mL/min 400 mL/min 400 mL/min  Arterial Pressure (mmHg) -150 mmHg -160 mmHg -160 mmHg  Venous Pressure (mmHg) 150 mmHg 160 mmHg 160 mmHg  Transmembrane Pressure (mmHg) 50 mmHg 50 mmHg 50 mmHg  Ultrafiltration Rate (mL/min) 330 mL/min 330 mL/min 330 mL/min  Dialysate Flow Rate (mL/min) 500 ml/min 500 ml/min 500 ml/min  Conductivity: Machine  13.9 13.9 13.9  HD Safety Checks Performed Yes Yes Yes  Intra-Hemodialysis Comments Tx initiated (Treatment start 0942) Progressing as prescribed Progressing as prescribed    01/20/21 1030 01/20/21 1045 01/20/21 1100  Vitals  BP (!) 68/49 108/78 121/74  MAP (mmHg) (!) 56 88 90  Pulse Rate 81 71 71  ECG Heart Rate 80 80 69  Resp 20 (!) 24 (!) 23  During Hemodialysis Assessment  Blood Flow Rate (mL/min) 400 mL/min 400 mL/min 400 mL/min  Arterial Pressure (mmHg) -160 mmHg -160 mmHg -160 mmHg  Venous Pressure (mmHg) 160 mmHg 160 mmHg 160 mmHg  Transmembrane Pressure (mmHg) 50 mmHg 50 mmHg 50 mmHg  Ultrafiltration Rate (mL/min) 330 mL/min 330 mL/min 330 mL/min  Dialysate Flow Rate (mL/min) 500 ml/min 500 ml/min 500 ml/min  Conductivity: Machine  13.9 13.9 13.9  HD Safety Checks Performed Yes Yes Yes  Intra-Hemodialysis Comments Progressing as prescribed Progressing as prescribed Progressing as prescribed

## 2021-01-23 NOTE — Interval H&P Note (Signed)
History and Physical Interval Note:  01/23/2021 2:27 PM  Sandra Massey  has presented today for surgery, with the diagnosis of ESRD.  The various methods of treatment have been discussed with the patient and family. After consideration of risks, benefits and other options for treatment, the patient has consented to  Procedure(s): DIALYSIS/PERMA CATHETER INSERTION (N/A) as a surgical intervention.  The patient's history has been reviewed, patient examined, no change in status, stable for surgery.  I have reviewed the patient's chart and labs.  Questions were answered to the patient's satisfaction.     Hortencia Pilar

## 2021-01-23 NOTE — Progress Notes (Signed)
   01/20/21 0945 01/20/21 1000 01/20/21 1015  Vitals  BP 123/67 (!) 110/57 (!) 122/58  MAP (mmHg) 78 75 78  Pulse Rate 77 77 73  ECG Heart Rate 71 83 71  Resp (!) 21 (!) 25 20  During Hemodialysis Assessment  Blood Flow Rate (mL/min) 400 mL/min 400 mL/min 400 mL/min  Arterial Pressure (mmHg) -150 mmHg -160 mmHg -160 mmHg  Venous Pressure (mmHg) 150 mmHg 160 mmHg 160 mmHg  Transmembrane Pressure (mmHg) 50 mmHg 50 mmHg 50 mmHg  Ultrafiltration Rate (mL/min) 330 mL/min 330 mL/min 330 mL/min  Dialysate Flow Rate (mL/min) 500 ml/min 500 ml/min 500 ml/min  Conductivity: Machine  13.9 13.9 13.9  HD Safety Checks Performed Yes Yes Yes  Intra-Hemodialysis Comments Tx initiated (Treatment start 0942) Progressing as prescribed Progressing as prescribed    01/20/21 1030 01/20/21 1045 01/20/21 1100  Vitals  BP (!) 68/49 108/78 121/74  MAP (mmHg) (!) 56 88 90  Pulse Rate 81 71 71  ECG Heart Rate 80 80 69  Resp 20 (!) 24 (!) 23  During Hemodialysis Assessment  Blood Flow Rate (mL/min) 400 mL/min 400 mL/min 400 mL/min  Arterial Pressure (mmHg) -160 mmHg -160 mmHg -160 mmHg  Venous Pressure (mmHg) 160 mmHg 160 mmHg 160 mmHg  Transmembrane Pressure (mmHg) 50 mmHg 50 mmHg 50 mmHg  Ultrafiltration Rate (mL/min) 330 mL/min 330 mL/min 330 mL/min  Dialysate Flow Rate (mL/min) 500 ml/min 500 ml/min 500 ml/min  Conductivity: Machine  13.9 13.9 13.9  HD Safety Checks Performed Yes Yes Yes  Intra-Hemodialysis Comments Progressing as prescribed Progressing as prescribed Progressing as prescribed    01/20/21 1115  Vitals  BP (!) 131/91  MAP (mmHg) 105  Pulse Rate 78  ECG Heart Rate 86  Resp (!) 25  During Hemodialysis Assessment  Blood Flow Rate (mL/min) 400 mL/min  Arterial Pressure (mmHg) -160 mmHg  Venous Pressure (mmHg) 160 mmHg  Transmembrane Pressure (mmHg) 50 mmHg  Ultrafiltration Rate (mL/min) 330 mL/min  Dialysate Flow Rate (mL/min) 500 ml/min  Conductivity: Machine  13.9  HD Safety  Checks Performed Yes  Intra-Hemodialysis Comments Progressing as prescribed

## 2021-01-23 NOTE — Progress Notes (Signed)
   01/20/21 1115 01/20/21 1130 01/20/21 1145  Vitals  BP (!) 131/91 (!) 117/97 122/67  MAP (mmHg) 105 105 83  Pulse Rate 78 74 67  ECG Heart Rate 86 65 72  Resp (!) 25 (!) 24 (!) 24  During Hemodialysis Assessment  Blood Flow Rate (mL/min) 400 mL/min 400 mL/min 400 mL/min  Arterial Pressure (mmHg) -160 mmHg -160 mmHg -160 mmHg  Venous Pressure (mmHg) 160 mmHg 160 mmHg 160 mmHg  Transmembrane Pressure (mmHg) 50 mmHg 50 mmHg 50 mmHg  Ultrafiltration Rate (mL/min) 330 mL/min 330 mL/min 330 mL/min  Dialysate Flow Rate (mL/min) 500 ml/min 500 ml/min 500 ml/min  Conductivity: Machine  13.9 13.9 13.9  HD Safety Checks Performed Yes Yes Yes  Intra-Hemodialysis Comments Progressing as prescribed Progressing as prescribed Progressing as prescribed    01/20/21 1200 01/20/21 1215 01/20/21 1230  Vitals  BP (!) 129/59 135/71 129/76  MAP (mmHg) 80 86 90  Pulse Rate 73 81 90  ECG Heart Rate 78 78 78  Resp (!) 27 (!) 24 (!) 22  During Hemodialysis Assessment  Blood Flow Rate (mL/min) 400 mL/min 400 mL/min 400 mL/min  Arterial Pressure (mmHg) -160 mmHg -160 mmHg -160 mmHg  Venous Pressure (mmHg) 160 mmHg 160 mmHg 160 mmHg  Transmembrane Pressure (mmHg) 50 mmHg 50 mmHg 50 mmHg  Ultrafiltration Rate (mL/min) 330 mL/min 330 mL/min 330 mL/min  Dialysate Flow Rate (mL/min) 500 ml/min 500 ml/min 500 ml/min  Conductivity: Machine  13.9 13.9 13.9  HD Safety Checks Performed Yes Yes Yes  Intra-Hemodialysis Comments Progressing as prescribed Progressing as prescribed Progressing as prescribed    01/20/21 1245  Vitals  BP 134/72  MAP (mmHg) 89  Pulse Rate 93  ECG Heart Rate 90  Resp (!) 27  During Hemodialysis Assessment  Blood Flow Rate (mL/min)  --   Arterial Pressure (mmHg)  --   Venous Pressure (mmHg)  --   Transmembrane Pressure (mmHg)  --   Ultrafiltration Rate (mL/min)  --   Dialysate Flow Rate (mL/min)  --   Conductivity: Machine   --   HD Safety Checks Performed  --    Intra-Hemodialysis Comments Tx completed (Treatment completed at  1249)

## 2021-01-23 NOTE — Care Management Important Message (Signed)
Important Message  Patient Details  Name: TENYA ARAQUE MRN: [id number] Date of Birth: 1950/11/04   Medicare Important Message Given:  Yes  Out of room upon visit.  Confirmed Medicare IM on windowsill for reference.   Dannette Barbara 01/23/2021, 2:25 PM

## 2021-01-23 NOTE — Progress Notes (Signed)
   01/20/21 1130 01/20/21 1145 01/20/21 1200  Vitals  BP (!) 117/97 122/67 (!) 129/59  MAP (mmHg) 105 83 80  Pulse Rate 74 67 73  ECG Heart Rate 65 72 78  Resp (!) 24 (!) 24 (!) 27  During Hemodialysis Assessment  Blood Flow Rate (mL/min) 400 mL/min 400 mL/min 400 mL/min  Arterial Pressure (mmHg) -160 mmHg -160 mmHg -160 mmHg  Venous Pressure (mmHg) 160 mmHg 160 mmHg 160 mmHg  Transmembrane Pressure (mmHg) 50 mmHg 50 mmHg 50 mmHg  Ultrafiltration Rate (mL/min) 330 mL/min 330 mL/min 330 mL/min  Dialysate Flow Rate (mL/min) 500 ml/min 500 ml/min 500 ml/min  Conductivity: Machine  13.9 13.9 13.9  HD Safety Checks Performed Yes Yes Yes  Intra-Hemodialysis Comments Progressing as prescribed Progressing as prescribed Progressing as prescribed    01/20/21 1215 01/20/21 1230 01/20/21 1245  Vitals  BP 135/71 129/76 134/72  MAP (mmHg) 86 90 89  Pulse Rate 81 90 93  ECG Heart Rate 78 78 90  Resp (!) 24 (!) 22 (!) 27  During Hemodialysis Assessment  Blood Flow Rate (mL/min) 400 mL/min 400 mL/min  --   Arterial Pressure (mmHg) -160 mmHg -160 mmHg  --   Venous Pressure (mmHg) 160 mmHg 160 mmHg  --   Transmembrane Pressure (mmHg) 50 mmHg 50 mmHg  --   Ultrafiltration Rate (mL/min) 330 mL/min 330 mL/min  --   Dialysate Flow Rate (mL/min) 500 ml/min 500 ml/min  --   Conductivity: Machine  13.9 13.9  --   HD Safety Checks Performed Yes Yes  --   Intra-Hemodialysis Comments Progressing as prescribed Progressing as prescribed Tx completed (Treatment completed at  1249)

## 2021-01-23 NOTE — Progress Notes (Signed)
Patient tolerates treatment without incident, femoral catheter maintains prescribed BFR, site is without s/sx of infection. Targeted UF met, 0.5 liter removed. Patient received Epogen, and dressing changed. Patient will return to unit.

## 2021-01-23 NOTE — TOC Progression Note (Signed)
Transition of Care [facility]) - Progression Note    Patient Details  Name: Sandra Massey MRN: [id number] Date of Birth: 12-08-1950  Transition of [facility]) CM/SW [place], [place] Phone Number: 01/23/2021, 12:07 PM  Clinical Narrative:     CSW confirmed with HD coordinator Estill Bamberg that patient does need to sit for HD and is currently in the bed receiving HD. Will need to tolerate sitting in chair before discharging.   Patient will need a hoyer pad to transfer from wheel chair to bed and back to wheel chair, [facility] informed.   RN informed of potential trial of sitting in chair in room. HD RN informed via Estill Bamberg with HD that patient needs to trial sitting in chair in next HD session.   Expected Discharge Plan:  (TBD) Barriers to Discharge: Continued Medical Work up  Expected Discharge Plan and Services Expected Discharge Plan:  (TBD)       Living arrangements for the past 2 months: [place]                                       Social Determinants of Health (SDOH) Interventions    Readmission Risk Interventions No flowsheet data found.

## 2021-01-24 ENCOUNTER — Encounter: Payer: Self-pay | Admitting: Vascular Surgery

## 2021-01-24 DIAGNOSIS — R410 Disorientation, unspecified: Secondary | ICD-10-CM | POA: Diagnosis not present

## 2021-01-24 LAB — GLUCOSE, CAPILLARY
Glucose-Capillary: 77 mg/dL (ref 70–99)
Glucose-Capillary: 171 mg/dL — ABNORMAL HIGH (ref 70–99)
Glucose-Capillary: 169 mg/dL — ABNORMAL HIGH (ref 70–99)
Glucose-Capillary: 174 mg/dL — ABNORMAL HIGH (ref 70–99)
Glucose-Capillary: 224 mg/dL — ABNORMAL HIGH (ref 70–99)

## 2021-01-24 NOTE — Progress Notes (Signed)
PT Cancellation Note  Patient Details Name: Sandra Massey MRN: [id number] DOB: 02-16-1951   Cancelled Treatment:    Reason Eval/Treat Not Completed: Fatigue/lethargy limiting ability to participate. Patient seems confused, difficult to understand. Declines PT at this time. Will continue to monitor.    Hermena Swint 01/24/2021, 2:39 PM

## 2021-01-24 NOTE — Progress Notes (Signed)
[facility] Progress Note  Patient: Sandra Massey    TDD:[id number]  DOA: 01/09/2021    Date of Service: the patient was seen and examined on 01/24/2021  Brief hospital course: Sandra Massey is a 70 y.o. female with a PMH significant for Jehovah's Witness, CVA with residual right-sided hemiparesis, chronic A. fib, type II DM, HTN. They presented from SNF to the ED on 01/09/2021 with acute AMS. In the ED, it was found that they had no acute changes on head CT but abnormal electrical activity on EEG.  Neurology was consulted. They were treated with Keppra.  Additionally, patient was septic with blood cultures positive for E. coli.  She was started on IV ceftriaxone and nephrology was consulted. Patient was admitted to medicine service for further workup and management of sepsis with acute encephalopathy as outlined in detail below.  Assessment and Plan: Sepsis secondary to E. coli bacteremia with acute encephalopathy- resolved. She has completed treatment for her infection. Afebrile and asymptomatic. At baseline mental status. Patient is stable to discharge. 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 TThS schedule. Creatinine not showing significant improvement/recovery. Baseline around 1.58 prior to admission.  Patient still lacks capacity for decision making. Sister is her decision maker.  -Nephrology following, appreciate recommendations - RFP on HD days -Vascular to place tunneled catheter 11/15 -Pursuing establishment of outpatient HD once patient can tolerate HD in chair for 4 hours and can be [facility] her sister when arranged   Anemia of chronic disease- Patient is Jehova's witness. Hgb very gradually declining throughout hospitalization likely in part due to dilution with significant IV fluids. Hgb  9.2>8.7>8.8 - CBC on HD days   A. Fib  HTN-CHA2DS2-VASc score= 6.  Rate controlled. -Continue home Eliquis -Continue home amiodarone 200 mg twice daily   Type II DM- hgb A1c on admission 6.9. blood sugars stable. - continue mSSI -15 units semglee daily   Seizure prophylaxis- no active seizure activity or repetitive motions observed -Neurology following, appreciate recommendations - continue keppra   H/o CVA  HLD-residual right-sided hemiparesis and dysphagia - assist with feeds -Continue home rosuvastatin - psychiatry consulted-   Obesity. Placing the patient at high risk for poor outcome. Body mass index is 39.47 kg/m.  Nutrition Problem: Inadequate oral intake Etiology: lethargy/confusion  Pressure Injury 01/14/21 Buttocks Right;Lower;Mid Deep Tissue Pressure Injury - Purple or maroon localized area of discolored intact skin or blood-filled blister due to damage of underlying soft tissue from pressure and/or shear. purple non-blanchable (Active)  01/14/21 0830  Location: Buttocks  Location Orientation: Right;Lower;Mid  Staging: Deep Tissue Pressure Injury - Purple or maroon localized area of discolored intact skin or blood-filled blister due to damage of underlying soft tissue from pressure and/or shear.  Wound Description (Comments): purple non-blanchable  Present on Admission: No     Pressure Injury 01/14/21 Buttocks Left;Mid Deep Tissue Pressure Injury - Purple or maroon localized area of discolored intact skin or blood-filled blister due to damage of underlying soft tissue from pressure and/or shear. purple, non-blanchable (Active)  01/14/21 0830  Location: Buttocks  Location Orientation: Left;Mid  Staging: Deep Tissue Pressure Injury - Purple or maroon localized area of discolored intact skin or blood-filled blister due to damage of underlying soft tissue from pressure and/or shear.  Wound Description (Comments): purple, non-blanchable  Present  on Admission: No      Subjective: No nausea no vomiting no fever no chills.  No pain.  Physical Exam: General: Appear in mild distress, no Rash; Oral Mucosa Clear, moist. no Abnormal Neck Mass Or lumps, Conjunctiva normal  Cardiovascular: S1 and S2 Present, no Murmur, Respiratory: good respiratory effort, Bilateral Air entry present and CTA, no Crackles, no wheezes Abdomen: Bowel Sound present, Soft and no tenderness Extremities: no Pedal edema Neurology: alert and oriented to time, place, and person affect appropriate. no new focal deficit Gait not checked due to patient safety concerns  Data Reviewed: My review of labs, imaging, notes and other tests shows no new significant findings.   Disposition:  Status is: Inpatient  Remains inpatient appropriate because: Still need to be sitting in the chair for 4 hours for HD  Family Communication: no family was present at bedside, at the time of interview.   DVT Prophylaxis:  apixaban (ELIQUIS) tablet 5 mg   Time spent: 35 minutes. I reviewed all nursing notes, pharmacy notes, vitals, pertinent old records. I have discussed plan of care as described above with RN.  Author: Berle Mull  01/24/2021 8:33 PM  To reach On-call, see care teams to locate the attending and reach out via [email or web address]. Between 7PM-7AM, please contact night-coverage If you still have difficulty reaching the attending provider, please page the [facility] (Director on Call) for [facility] on amion for assistance.

## 2021-01-24 NOTE — Progress Notes (Signed)
Nutrition Follow-up  DOCUMENTATION CODES:   Obesity unspecified  INTERVENTION:   -Continue Nepro Shake po BID, each supplement provides 425 kcal and 19 grams protein  -Continue 30 ml Prosource Plus TID, each supplement provides 100 kcals and 15 grams protein -Continue feeding assistance with meals -Reached out to MD regarding starting bowel regimen (last BM 01/16/21)  NUTRITION DIAGNOSIS:   Inadequate oral intake related to lethargy/confusion as evidenced by meal completion < 25%.  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 Admitted with severe sepsis present on admission secondary to E. coli bacteremia.  11/6- HD initiated 11/5- s/p Insertion of tunneled dialysis catheter right IJ approach with ultrasound and fluoroscopic guidance.  Reviewed I/O's: -390 ml x 24 hours and +3.8 L since 01/10/21  Per chart review, pt family desire aggressive measures and would like to continue with HD. Permanent HD access placed yesterday.   Intake has improved since last visit. Noted meal completions 25-100%. Pt is taking Nepro supplements, but is inconsistent with Prosource Plus.   Noted last BM documented on 01/16/21. Reached out to MD via secure chat regarding possibility of starting bowel regimen.   Labs reviewed: CBGS: 91-171 (inpatient orders for glycemic control are 0-15 units insulin aspart TID with meals and 15 units insulin glargine-yfgn daily).    Diet Order:   Diet Order             Diet renal/carb modified with fluid restriction Diet-HS  Snack? Nothing; Fluid restriction: 1200 mL Fluid; Room service appropriate? No; Fluid consistency: Thin  Diet effective now                   EDUCATION NEEDS:   Not appropriate for education at this time  Skin:  Skin Assessment: Skin Integrity Issues: Skin Integrity Issues:: DTI, Other (Comment) DTI: L & R buttocks Other: MASD on lower abdomen  Last BM:  01/16/21  Height:   Ht Readings from Last 1 Encounters:  01/09/21 5\' 7"  (1.702 m)    Weight:   Wt Readings from Last 1 Encounters:  01/24/21 114.3 kg    BMI:  Body mass index is 39.47 kg/m.  Estimated Nutritional Needs:   Kcal:  1850-2050  Protein:  90-105 grams  Fluid:  1000 ml + UOP    Loistine Chance, RD, LDN, [place] Registered Dietitian II Certified Diabetes Care and Education Specialist Please refer to [facility] for RD and/or RD on-call/weekend/after hours pager

## 2021-01-24 NOTE — Progress Notes (Signed)
[facility]  PROGRESS NOTE   Subjective:   Sandra Massey  Hemodialysis treatment yesterday. Tolerated treatment well.   Patient with confusion this morning  Objective:  Vital signs in last 24 hours:  Temp:  [97.7 F (36.5 C)-98.9 F (37.2 C)] 97.9 F (36.6 C) (11/16 0849) Pulse Rate:  [50-88] 71 (11/16 0849) Resp:  [16-19] 18 (11/16 0849) BP: (94-133)/(36-91) 119/59 (11/16 0849) SpO2:  [89 %-97 %] 93 % (11/16 0849) Weight:  [114.3 kg] 114.3 kg (11/16 0337)  Weight change: 0 kg Filed Weights   01/23/21 0500 01/23/21 0845 01/24/21 0337  Weight: 113.9 kg 113.9 kg 114.3 kg    Intake/Output: I/O last 3 completed shifts: In: 510 [P.O.:110; IV Piggyback:400] Out: 500 [Other:500]   Intake/Output this shift:  Total I/O In: 120 [P.O.:120] Out: -   Physical Exam: General:  No acute distress, sitting up in bed.   Head:  Normocephalic, atraumatic. Moist oral mucosal membranes  Eyes:  Anicteric  Neck:  Supple  Lungs:   Clear to auscultation, normal effort  Heart:  regular  Abdomen:   Soft, nontender, bowel sounds present  Extremities:  peripheral edema.  Neurologic:  Awake, following commands, oriented to self and place  Skin:  No lesions  Access: Right femoral temp HD catheter Sandra permcath 11/15 Dr. Delana Massey    Basic Metabolic Panel: Recent Labs  Lab 01/18/21 0440 01/19/21 0535 01/20/21 0622 01/22/21 0936 01/23/21 0857  NA 134* 136 136 135 135  K 3.3* 3.6 3.7 4.6 4.9  CL 99 100 100 98 98  CO2 27 28 28 28 28   GLUCOSE 278* 216* 208* 176* 135*  BUN 37* 20 31* 31* 41*  CREATININE 3.97* 2.79* 3.81* 4.02* 4.73*  CALCIUM 8.0* 8.0* 8.5* 8.8* 8.5*  PHOS  --   --   --  5.1* 6.0*     CBC: Recent Labs  Lab 01/17/21 1456 01/18/21 0440 01/19/21 0535 01/20/21 0622 01/23/21 0857  WBC 8.8 9.7 11.7* 15.1* 12.4*  HGB 9.7* 9.2* 8.7* 8.8* 8.4*  HCT 29.9* 28.7* 27.1* 27.9* 26.6*  MCV 84.7 85.7 88.0 89.4 89.6  PLT 237 239  256 278 358      Urinalysis: No results for input(s): COLORURINE, LABSPEC, PHURINE, GLUCOSEU, HGBUR, BILIRUBINUR, KETONESUR, PROTEINUR, UROBILINOGEN, NITRITE, LEUKOCYTESUR in the last 72 hours.  Invalid input(s): APPERANCEUR    Imaging: PERIPHERAL VASCULAR CATHETERIZATION  Result Date: 01/23/2021 See surgical note for result.    Medications:    sodium chloride 10 mL/hr at 01/17/21 0427   levETIRAcetam     levETIRAcetam 500 mg (01/24/21 0957)    (feeding supplement) PROSource Plus  30 mL Oral TID BM   amiodarone  200 mg Oral BID   apixaban  5 mg Oral BID   Chlorhexidine Gluconate Cloth  6 each Topical Daily   epoetin (EPOGEN/PROCRIT) injection  10,000 Units Intravenous Q T,Th,Sa-HD   feeding supplement (NEPRO CARB STEADY)  237 mL Oral BID BM   heparin sodium (porcine)  2,000 Units Intracatheter Once   insulin aspart  0-15 Units Subcutaneous TID WC   insulin glargine-yfgn  15 Units Subcutaneous Daily   multivitamin  1 tablet Oral QHS   rosuvastatin  10 mg Oral QPM    Assessment/ Plan:     Principal Problem:   Acute delirium Active Problems:   ATN (acute tubular necrosis) (HCC)   Speech abnormality   Sepsis ([place])   Cerebrovascular accident (CVA) ([place])   Altered mental status  Sepsis due to gram-negative urinary tract infection ([place])   ESRD (end stage renal disease) ([place])   Sandra Massey is a 70 y.o. white female SNF resident with atrial fibrillation, diabetes mellitus type II, hypertension, CVA who is admitted to [facility] on 01/09/2021 for UTI (urinary tract infection) [N39.0] Speech abnormality [R47.9] MRI contraindicated due to metal implant [Z53.09] Sepsis ([place]) [A41.9] Cerebrovascular accident (CVA), unspecified mechanism ([place]) [I63.9] AMS (altered mental status) [R41.82]  #1: End-stage renal disease: patient's family wants to pursue outpatient hemodialysis.  - remove femoral dialysis catheter - Next hemodialysis treatment for tomorrow.  - initiate  outpatient planning. Plan for [name].   #2: Hypertension with chronic kidney disease: 119/59. Outpatient regimen of amlodipine. Currently not on any blood pressure agents.  #3: Diabetes mellitus type II with chronic kidney disease: insulin dependent. History of poor control. Hemoglobin A1c is low due to renal insufficiency, 6.9%.  - Continue glucose control  #4: Anemia with chronic kidney disease: hemoglobin 8.4.  - EPO started on hemodiaysis treatments.    LOS: [place], [facility] 11/16/202211:59 AM

## 2021-01-24 NOTE — Progress Notes (Signed)
Pt sat in the chair for almost 3 hrs.

## 2021-01-25 DIAGNOSIS — R410 Disorientation, unspecified: Secondary | ICD-10-CM | POA: Diagnosis not present

## 2021-01-25 LAB — RENAL FUNCTION PANEL
Albumin: 2 g/dL — ABNORMAL LOW (ref 3.5–5.0)
Anion gap: 11 (ref 5–15)
BUN: 34 mg/dL — ABNORMAL HIGH (ref 8–23)
CO2: 28 mmol/L (ref 22–32)
Calcium: 8.4 mg/dL — ABNORMAL LOW (ref 8.9–10.3)
Chloride: 98 mmol/L (ref 98–111)
Creatinine, Ser: 4.24 mg/dL — ABNORMAL HIGH (ref 0.44–1.00)
GFR, Estimated: 11 mL/min — ABNORMAL LOW (ref >60)
Glucose, Bld: 175 mg/dL — ABNORMAL HIGH (ref 70–99)
Phosphorus: 4.7 mg/dL — ABNORMAL HIGH (ref 2.5–4.6)
Potassium: 4.2 mmol/L (ref 3.5–5.1)
Sodium: 137 mmol/L (ref 135–145)

## 2021-01-25 LAB — GLUCOSE, CAPILLARY
Glucose-Capillary: 170 mg/dL — ABNORMAL HIGH (ref 70–99)
Glucose-Capillary: 194 mg/dL — ABNORMAL HIGH (ref 70–99)
Glucose-Capillary: 194 mg/dL — ABNORMAL HIGH (ref 70–99)
Glucose-Capillary: 156 mg/dL — ABNORMAL HIGH (ref 70–99)

## 2021-01-25 LAB — CBC
HCT: 25.1 % — ABNORMAL LOW (ref 36.0–46.0)
Hemoglobin: 7.7 g/dL — ABNORMAL LOW (ref 12.0–15.0)
MCH: 28.4 pg (ref 26.0–34.0)
MCHC: 30.7 g/dL (ref 30.0–36.0)
MCV: 92.6 fL (ref 80.0–100.0)
Platelets: 397 10*3/uL (ref 150–400)
RBC: 2.71 MIL/uL — ABNORMAL LOW (ref 3.87–5.11)
RDW: 18.6 % — ABNORMAL HIGH (ref 11.5–15.5)
WBC: 8.5 10*3/uL (ref 4.0–10.5)
nRBC: 0 % (ref 0.0–0.2)

## 2021-01-25 MED ORDER — DOCUSATE SODIUM 100 MG PO CAPS
100.0000 mg | ORAL_CAPSULE | Freq: Every day | ORAL | Status: DC
  Administered 2021-01-25 – 2021-01-29 (×4): 100 mg via ORAL
  Filled 2021-01-25 (×3): qty 1

## 2021-01-25 MED ORDER — LEVETIRACETAM 500 MG/5ML IV SOLN
250.0000 mg | INTRAVENOUS | Status: DC
Start: 2021-01-25 — End: 2021-01-26
  Administered 2021-01-25: 17:00:00 250 mg via INTRAVENOUS
  Filled 2021-01-25: qty 2.5

## 2021-01-25 MED ORDER — HEPARIN SODIUM (PORCINE) 1000 UNIT/ML IJ SOLN
INTRAMUSCULAR | Status: AC
  Filled 2021-01-25: qty 1

## 2021-01-25 NOTE — Progress Notes (Signed)
PT Cancellation Note  Patient Details Name: Sandra Massey MRN: [id number] DOB: 1950/05/25   Cancelled Treatment:    Reason Eval/Treat Not Completed: Patient at procedure or test/unavailable. Patient off unit for dialysis. Will re-attempt PT later today as time allows.    Reta Norgren 01/25/2021, 10:15 AM

## 2021-01-25 NOTE — Progress Notes (Signed)
[facility]  PROGRESS NOTE   Subjective:   Seen and examined on hemodialysis treatment. Tolerating treatment well. Seated in a chair.    HEMODIALYSIS FLOWSHEET:  Blood Flow Rate (mL/min): 400 mL/min Arterial Pressure (mmHg): -130 mmHg Venous Pressure (mmHg): 110 mmHg Transmembrane Pressure (mmHg): 60 mmHg Ultrafiltration Rate (mL/min): 500 mL/min Dialysate Flow Rate (mL/min): 500 ml/min Conductivity: Machine : 13.8 Conductivity: Machine : 13.8 Dialysis Fluid Bolus: Normal Saline Bolus Amount (mL): 250 mL   Objective:  Vital signs in last 24 hours:  Temp:  [97.9 F (36.6 C)-98.4 F (36.9 C)] 97.9 F (36.6 C) (11/17 0945) Pulse Rate:  [61-78] 72 (11/17 1115) Resp:  [15-29] 17 (11/17 1115) BP: (95-124)/(42-81) 119/49 (11/17 1115) SpO2:  [92 %-100 %] 98 % (11/17 1115)  Weight change:  Filed Weights   01/23/21 0500 01/23/21 0845 01/24/21 0337  Weight: 113.9 kg 113.9 kg 114.3 kg    Intake/Output: I/O last 3 completed shifts: In: 930 [P.O.:830; IV Piggyback:100] Out: -    Intake/Output this shift:  No intake/output data recorded.  Physical Exam: General:  No acute distress, sitting in chair  Head:  Normocephalic, atraumatic. Moist oral mucosal membranes  Eyes:  Anicteric  Neck:  Supple  Lungs:   Clear to auscultation, normal effort  Heart:  regular  Abdomen:   Soft, nontender, bowel sounds present  Extremities:  peripheral edema.  Neurologic:  Awake, following commands, oriented to self and place  Skin:  No lesions  Access: Right femoral temp HD catheter RIJ permcath 11/15 Dr. Delana Meyer    Basic Metabolic Panel: Recent Labs  Lab 01/19/21 0535 01/20/21 0622 01/22/21 0936 01/23/21 0857 01/25/21 0805  NA 136 136 135 135 137  K 3.6 3.7 4.6 4.9 4.2  CL 100 100 98 98 98  CO2 28 28 28 28 28   GLUCOSE 216* 208* 176* 135* 175*  BUN 20 31* 31* 41* 34*  CREATININE 2.79* 3.81* 4.02* 4.73* 4.24*  CALCIUM 8.0* 8.5* 8.8* 8.5* 8.4*  PHOS  --   --  5.1*  6.0* 4.7*     CBC: Recent Labs  Lab 01/19/21 0535 01/20/21 0622 01/23/21 0857 01/25/21 0805  WBC 11.7* 15.1* 12.4* 8.5  HGB 8.7* 8.8* 8.4* 7.7*  HCT 27.1* 27.9* 26.6* 25.1*  MCV 88.0 89.4 89.6 92.6  PLT 256 278 358 397      Urinalysis: No results for input(s): COLORURINE, LABSPEC, PHURINE, GLUCOSEU, HGBUR, BILIRUBINUR, KETONESUR, PROTEINUR, UROBILINOGEN, NITRITE, LEUKOCYTESUR in the last 72 hours.  Invalid input(s): APPERANCEUR    Imaging: PERIPHERAL VASCULAR CATHETERIZATION  Result Date: 01/23/2021 See surgical note for result.    Medications:    sodium chloride 10 mL/hr at 01/17/21 0427   levETIRAcetam     levETIRAcetam 500 mg (01/24/21 0957)    (feeding supplement) PROSource Plus  30 mL Oral TID BM   amiodarone  200 mg Oral BID   apixaban  5 mg Oral BID   Chlorhexidine Gluconate Cloth  6 each Topical Daily   docusate sodium  100 mg Oral Daily   epoetin (EPOGEN/PROCRIT) injection  10,000 Units Intravenous Q T,Th,Sa-HD   feeding supplement (NEPRO CARB STEADY)  237 mL Oral BID BM   heparin sodium (porcine)  2,000 Units Intracatheter Once   insulin aspart  0-15 Units Subcutaneous TID WC   insulin glargine-yfgn  15 Units Subcutaneous Daily   multivitamin  1 tablet Oral QHS   rosuvastatin  10 mg Oral QPM    Assessment/ Plan:     Principal Problem:  Acute delirium Active Problems:   ATN (acute tubular necrosis) (HCC)   Speech abnormality   Sepsis ([place])   Cerebrovascular accident (CVA) ([place])   Altered mental status   Sepsis due to gram-negative urinary tract infection ([place])   ESRD (end stage renal disease) ([place])   [name] is a 70 y.o. white female SNF resident with atrial fibrillation, diabetes mellitus type II, hypertension, CVA who is admitted to [facility] on 01/09/2021 for UTI (urinary tract infection) [N39.0] Speech abnormality [R47.9] MRI contraindicated due to metal implant [Z53.09] Sepsis ([place]) [A41.9] Cerebrovascular accident (CVA),  unspecified mechanism ([place]) [I63.9] AMS (altered mental status) [R41.82]  #1: End-stage renal disease: patient's family wants to pursue outpatient hemodialysis. ESRD secondary to acute kidney injury with no recovery.  - remove femoral dialysis catheter later today - Continue TTS schedule until outpatient schedule established.  - initiate outpatient planning. Plan for [name].   #2: Hypertension with chronic kidney disease: 124/57. Outpatient regimen of amlodipine. Currently not on any blood pressure agents.  #3: Diabetes mellitus type II with chronic kidney disease: insulin dependent. History of poor control.   - Continue glucose control  #4: Anemia with chronic kidney disease: hemoglobin 7.7. normocytic.   - EPO started on hemodiaysis treatments.  - iron studies.    LOS: [place], [facility] 11/17/202211:41 AM

## 2021-01-25 NOTE — Progress Notes (Signed)
Pt with 3 hr scheduled HD session via newly placed RIJ CVC., tolerated treatment without incident. Catheter maintained prescribed BFR/DFR. Targeted UF met, 1 liter removed.VSS, returned to room.

## 2021-01-25 NOTE — Progress Notes (Signed)
OT Cancellation Note  Patient Details Name: JAYDALEE BARDWELL MRN: [id number] DOB: 1950-10-31   Cancelled Treatment:    Reason Eval/Treat Not Completed: Patient at procedure or test/ unavailable. Pt currently out of the room for dialysis. Will re-attempt OT tx at later date/time as pt is available.   Ardeth Perfect., MPH, MS, OTR/L ascom [phone] 01/25/21, 9:48 AM

## 2021-01-25 NOTE — Progress Notes (Signed)
[facility] PROGRESS NOTE  Patient: Sandra Massey ZBF:[id number]   PCP: [name], MD DOB: 04-Feb-1951   DOA: 01/09/2021   DOS: 01/25/2021    Assessment and plan: Patient spent most of the time of the floor.  Tolerated HD well wheelchair. Accepted for outpatient HD. Will discuss with social worker for discharge planning.  Author: Berle Mull, MD [facility] 01/25/2021 8:25 PM   If 7PM-7AM, please contact night-coverage at [email or web address]

## 2021-01-25 NOTE — Progress Notes (Addendum)
Patient accepted at Sandra [facility]sey) TTS 6:45am, 1st tx 11/22 at 6:30am. Please inform the SNF that patient will require a hoyer pad placed under patient to assist with safe transfer from wheelchair to dialysis recliner at clinic.

## 2021-01-26 DIAGNOSIS — R410 Disorientation, unspecified: Secondary | ICD-10-CM | POA: Diagnosis not present

## 2021-01-26 DIAGNOSIS — E669 Obesity, unspecified: Secondary | ICD-10-CM | POA: Diagnosis present

## 2021-01-26 DIAGNOSIS — A4151 Sepsis due to Escherichia coli [E. coli]: Secondary | ICD-10-CM | POA: Diagnosis present

## 2021-01-26 DIAGNOSIS — N189 Chronic kidney disease, unspecified: Secondary | ICD-10-CM | POA: Diagnosis present

## 2021-01-26 DIAGNOSIS — D631 Anemia in chronic kidney disease: Secondary | ICD-10-CM | POA: Diagnosis present

## 2021-01-26 LAB — IRON AND TIBC
Iron: 36 ug/dL (ref 28–170)
Saturation Ratios: 12 % (ref 10.4–31.8)
TIBC: 297 ug/dL (ref 250–450)
UIBC: 261 ug/dL

## 2021-01-26 LAB — FOLATE: Folate: 10.2 ng/mL (ref >5.9)

## 2021-01-26 LAB — GLUCOSE, CAPILLARY
Glucose-Capillary: 131 mg/dL — ABNORMAL HIGH (ref 70–99)
Glucose-Capillary: 195 mg/dL — ABNORMAL HIGH (ref 70–99)
Glucose-Capillary: 148 mg/dL — ABNORMAL HIGH (ref 70–99)
Glucose-Capillary: 138 mg/dL — ABNORMAL HIGH (ref 70–99)

## 2021-01-26 LAB — VITAMIN B12: Vitamin B-12: 914 pg/mL (ref 180–914)

## 2021-01-26 LAB — FERRITIN: Ferritin: 181 ng/mL (ref 11–307)

## 2021-01-26 MED ORDER — HYDROCODONE-ACETAMINOPHEN 5-325 MG PO TABS
1.0000 | ORAL_TABLET | Freq: Four times a day (QID) | ORAL | Status: DC | PRN
  Administered 2021-01-28 – 2021-01-29 (×2): 1 via ORAL
  Filled 2021-01-26 (×2): qty 1

## 2021-01-26 MED ORDER — LEVETIRACETAM 250 MG PO TABS
250.0000 mg | ORAL_TABLET | ORAL | Status: DC
  Administered 2021-01-27: 250 mg via ORAL
  Filled 2021-01-26: qty 1

## 2021-01-26 MED ORDER — QUETIAPINE FUMARATE 25 MG PO TABS
12.5000 mg | ORAL_TABLET | Freq: Every day | ORAL | Status: DC
  Administered 2021-01-26 – 2021-01-28 (×3): 12.5 mg via ORAL
  Filled 2021-01-26 (×3): qty 1

## 2021-01-26 MED ORDER — LEVETIRACETAM 500 MG PO TABS
500.0000 mg | ORAL_TABLET | Freq: Every day | ORAL | Status: DC
  Administered 2021-01-28 – 2021-01-29 (×2): 500 mg via ORAL
  Filled 2021-01-26 (×3): qty 1

## 2021-01-26 NOTE — Progress Notes (Signed)
OT Cancellation Note  Patient Details Name: Sandra Massey MRN: [id number] DOB: 07/09/1950   Cancelled Treatment:    Reason Eval/Treat Not Completed: Medical issues which prohibited therapy. 2 attempts made, pt nauseated and vomiting. RN notified. Repositioned with PTA for improved upright posture to minimize risk of aspiration if she vomits again. Will re-attempt OT tx at later date/time as pt is able to tolerate and medically appropriate.   Ardeth Perfect., MPH, MS, OTR/L ascom [phone] 01/26/21, 2:38 PM

## 2021-01-26 NOTE — Progress Notes (Signed)
PT Cancellation Note  Patient Details Name: Sandra Massey MRN: [id number] DOB: [birth year]   Cancelled Treatment:     Pt was asleep laying in vomit upon arriving. Author assisted pt with hygiene care however when bed laid flat to assist pt with positioning , continues to vomit. She requires +2 assist to reposition to [facility]. Removed O2 for short period of time and pt desaturates to 86%. Reapplied 2 L o2 and sao2 recovers to 90s. Pt was unable to demonstrate any AROM in RUE/RLE. Will message MD for Prevalon boots to keep skin integrity in tact. Will return at later time and date and continue to follow per current POC.   Willette Pa 01/26/2021, 2:40 PM

## 2021-01-26 NOTE — Assessment & Plan Note (Addendum)
Sacral, stage II. Continue foam dressing.  Present on admission

## 2021-01-26 NOTE — Assessment & Plan Note (Addendum)
residual right-sided hemiparesis and expressive aphasia as well as dysphagia. - assist with feeds -Continue home rosuvastatin.

## 2021-01-26 NOTE — Assessment & Plan Note (Addendum)
Sepsis present on admission with SIRS criteria met with tachycardia tachypnea and confusion. cultures positive for E. Coli. Sepsis resolved. -Ancef (11/7-11/12), per ID - CTX (11/3-11/6) - cefepime (11/2) - flagyl 11/2 - vancomycin 11/2. 

## 2021-01-26 NOTE — Progress Notes (Signed)
[facility]  PROGRESS NOTE   Subjective:   Hemodialysis treatment yesterday. Tolerated treatment.   Objective:  Vital signs in last 24 hours:  Temp:  [97.6 F (36.4 C)-98.8 F (37.1 C)] 98.6 F (37 C) (11/18 1134) Pulse Rate:  [53-79] 69 (11/18 1134) Resp:  [16-27] 17 (11/18 1134) BP: (95-135)/(50-78) 125/71 (11/18 1134) SpO2:  [92 %-100 %] 98 % (11/18 1134)  Weight change:  Filed Weights   01/23/21 0500 01/23/21 0845 01/24/21 0337  Weight: 113.9 kg 113.9 kg 114.3 kg    Intake/Output: I/O last 3 completed shifts: In: 210 [IV WFUXNATFT:732] Out: 2025 [Urine:800; Other:1008]   Intake/Output this shift:  Total I/O In: 240 [P.O.:240] Out: -   Physical Exam: General:  No acute distress, laying in bed  Head:  Normocephalic, atraumatic. Moist oral mucosal membranes  Eyes:  Anicteric  Neck:  Supple  Lungs:   Clear to auscultation, normal effort  Heart:  regular  Abdomen:   Soft, nontender, bowel sounds present  Extremities:  peripheral edema.  Neurologic:  Awake, following commands, oriented to self and place  Skin:  No lesions  Access: RIJ permcath 11/15 Dr. Delana Meyer    Basic Metabolic Panel: Recent Labs  Lab 01/20/21 0622 01/22/21 0936 01/23/21 0857 01/25/21 0805  NA 136 135 135 137  K 3.7 4.6 4.9 4.2  CL 100 98 98 98  CO2 28 28 28 28   GLUCOSE 208* 176* 135* 175*  BUN 31* 31* 41* 34*  CREATININE 3.81* 4.02* 4.73* 4.24*  CALCIUM 8.5* 8.8* 8.5* 8.4*  PHOS  --  5.1* 6.0* 4.7*     CBC: Recent Labs  Lab 01/20/21 0622 01/23/21 0857 01/25/21 0805  WBC 15.1* 12.4* 8.5  HGB 8.8* 8.4* 7.7*  HCT 27.9* 26.6* 25.1*  MCV 89.4 89.6 92.6  PLT 278 358 397      Urinalysis: No results for input(s): COLORURINE, LABSPEC, PHURINE, GLUCOSEU, HGBUR, BILIRUBINUR, KETONESUR, PROTEINUR, UROBILINOGEN, NITRITE, LEUKOCYTESUR in the last 72 hours.  Invalid input(s): APPERANCEUR    Imaging: No results found.   Medications:    sodium chloride 10  mL/hr at 01/17/21 0427    (feeding supplement) PROSource Plus  30 mL Oral TID BM   amiodarone  200 mg Oral BID   apixaban  5 mg Oral BID   Chlorhexidine Gluconate Cloth  6 each Topical Daily   docusate sodium  100 mg Oral Daily   epoetin (EPOGEN/PROCRIT) injection  10,000 Units Intravenous Q T,Th,Sa-HD   feeding supplement (NEPRO CARB STEADY)  237 mL Oral BID BM   heparin sodium (porcine)  2,000 Units Intracatheter Once   insulin aspart  0-15 Units Subcutaneous TID WC   insulin glargine-yfgn  15 Units Subcutaneous Daily   [START ON 01/27/2021] levETIRAcetam  250 mg Oral Once per day on Tue Thu Sat   [START ON 01/27/2021] levETIRAcetam  500 mg Oral Daily   multivitamin  1 tablet Oral QHS   rosuvastatin  10 mg Oral QPM    Assessment/ Plan:     Principal Problem:   Acute delirium Active Problems:   ATN (acute tubular necrosis) (HCC)   Speech abnormality   Sepsis ([place])   Cerebrovascular accident (CVA) ([place])   Altered mental status   Sepsis due to gram-negative urinary tract infection ([place])   ESRD (end stage renal disease) ([place])   Ms. SHEYNA PETTIBONE is a 70 y.o. white female SNF resident with atrial fibrillation, diabetes mellitus type II, hypertension, CVA who is admitted to [facility] on  01/09/2021 for UTI (urinary tract infection) [N39.0] Speech abnormality [R47.9] MRI contraindicated due to metal implant [Z53.09] Sepsis ([place]) [A41.9] Cerebrovascular accident (CVA), unspecified mechanism ([place]) [I63.9] AMS (altered mental status) [R41.82]  #1: End-stage renal disease: patient's family wants to pursue outpatient hemodialysis. ESRD secondary to acute kidney injury with no recovery.  - Continue TTS schedule: [name] 6:45 am  #2: Hypertension with chronic kidney disease: 125/71. Outpatient regimen of amlodipine.  #3: Diabetes mellitus type II with chronic kidney disease: insulin dependent. History of poor control.   - Continue glucose control  #4: Anemia with chronic kidney  disease: hemoglobin 7.7. normocytic.   - EPO started on hemodiaysis treatments.  - iron studies.    LOS: [place], [facility] 11/18/20221:26 PM

## 2021-01-26 NOTE — Assessment & Plan Note (Addendum)
Placing the patient at high risk of poor outcome Body mass index is 39.47 kg/m.

## 2021-01-26 NOTE — Progress Notes (Signed)
[facility] Progress Note  Patient: Sandra Massey    BJS:[id number]  DOA: 01/09/2021    Date of Service: the patient was seen and examined on 01/26/2021  Brief hospital course: Sandra Massey is a 70 y.o. female with a PMH significant for Jehovah's Witness, CVA with residual right-sided hemiparesis, chronic A. fib, type II DM, HTN. They presented from SNF to the ED on 01/09/2021 with acute AMS. In the ED, it was found that they had no acute changes on head CT but abnormal electrical activity on EEG.  Neurology was consulted. They were treated with Keppra.  Additionally, patient was septic with blood cultures positive for E. coli.  She was started on IV ceftriaxone and nephrology was consulted. Patient was admitted to medicine service for further workup and management of sepsis with acute encephalopathy as outlined in detail below. Now medically stable.  Need to transfer to SNF. Patient would benefit from outpatient palliative care consultation for further clarity on goals of care.  Assessment and Plan:  * Acute delirium In the setting of sepsis. Mentation still not back to baseline although at baseline the patient is still not oriented and frequently agitated. Add Seroquel. On as needed Haldol. Neurology was consulted. Extensive work-up performed throughout the hospital course including an EEG which showed possible epileptogenic foci and therefore patient was started on Keppra.  Sepsis due to Escherichia coli (E. coli) ([place]) Sepsis present on admission with SIRS criteria met with tachycardia tachypnea and confusion. cultures positive for E. coli. Sepsis resolved. -Ancef (11/7-11/12), per ID - CTX (11/3-11/6) - cefepime (11/2) - flagyl 11/2 - vancomycin 11/2  ESRD (end stage renal disease) ([place]) Presented with sepsis.  Also had AKI at the time of admission. Renal function did not improve.  Encephalopathy worsened at throughout the course of the hospitalization. Nephrology was  consulted. Patient was started on HD at the request of the family. Currently has a permanent dialysis catheter placed. Has a chair time outpatient as well. Awaiting SNF arrangements.   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 - assist with feeds -Continue home rosuvastatin  Chronic a-fib (HCC) Rate controlled. On Eliquis.  Continue. Also on amiodarone.  Continue.  Anemia due to chronic kidney disease Patient is Jehova's witness. Hgb very gradually declining throughout hospitalization likely in part due to dilution with significant IV fluids. Hgb 9.2>8.7>8.8 - CBC on HD days  Pressure injury of skin Sacral, stage II. Continue foam dressing.  Present on admission.  Obesity (BMI 30-39.9) Placing the patient at high risk of poor outcome. Body mass index is 39.47 kg/m.     Body mass index is 39.47 kg/m.  Nutrition Problem: Inadequate oral intake Etiology: lethargy/confusion    Subjective: Patient quite agitated.  Unable to tell me what is bothering her although crying in pain with any movement involving her right upper extremity as well as bottom.  No nausea no vomiting.  No fever no chills.  Physical Exam: General: Appear in marked distress, no Rash; Oral Mucosa Clear, moist. no Abnormal Neck Mass Or lumps, Conjunctiva normal  Cardiovascular: S1 and S2 Present, no Murmur, Respiratory: good respiratory effort, Bilateral Air entry present and CTA, no Crackles, no wheezes Abdomen: Bowel Sound present, Soft and no tenderness Extremities: trace Pedal edema Neurology: alert and oriented to place and person affect tearful. no new focal deficit, chronic right-sided weakness Gait not checked due to patient safety concerns  Data Reviewed: My  review of labs, imaging, notes and other tests shows no new significant findings.   Disposition:  Status is: Inpatient  Remains  inpatient appropriate because: Unsafe discharge.  Social worker is working on Passenger transport manager.  Patient's prior SNF denied to take the patient.   Family Communication: no family was present at bedside, at the time of interview.   DVT Prophylaxis:  apixaban (ELIQUIS) tablet 5 mg   Time spent: 35 minutes. I reviewed all nursing notes, pharmacy notes, vitals, pertinent old records. I have discussed plan of care as described above with RN.  Author: Berle Mull  01/26/2021 8:17 PM  To reach On-call, see care teams to locate the attending and reach out via [email or web address]. Between 7PM-7AM, please contact night-coverage If you still have difficulty reaching the attending provider, please page the [facility] (Director on Call) for [facility] on amion for assistance.

## 2021-01-26 NOTE — Assessment & Plan Note (Addendum)
Patient is Jehova's witness. Hgb very gradually declining throughout hospitalization likely in part due to dilution with significant IV fluids. Hgb 9.2>8.7>8.8> 7.9 - CBC on HD days

## 2021-01-26 NOTE — Hospital Course (Addendum)
Sandra Massey is a 70 y.o. female with a PMH significant for Jehovah's Witness, CVA with residual right-sided hemiparesis, chronic A. fib, type II DM, HTN. They presented from SNF to the ED on 01/09/2021 with acute AMS. In the ED, it was found that they had no acute changes on head CT but abnormal electrical activity on EEG.  Neurology was consulted. They were treated with Keppra.  Additionally, patient was septic with blood cultures positive for E. coli.  She was started on IV ceftriaxone and nephrology was consulted. Patient was admitted to medicine service for further workup and management of sepsis with acute encephalopathy as outlined in detail below. Now medically stable.  Need to transfer to SNF. Patient would benefit from outpatient palliative care consultation for further clarity on goals of care.  11/19: HD today 11/20 - Waiting for facility placement

## 2021-01-26 NOTE — Assessment & Plan Note (Addendum)
In the setting of sepsis. Mentation still not back to baseline although at baseline the patient is still not oriented and frequently agitated. Continue Seroquel. On as needed Haldol. Neurology was consulted. Extensive work-up performed throughout the hospital course including an EEG which showed possible epileptogenic foci and therefore patient was started on Keppra.

## 2021-01-26 NOTE — Assessment & Plan Note (Addendum)
Presented with sepsis.  Also had AKI at the time of admission. Renal function did not improve.  Encephalopathy worsened at throughout the course of the hospitalization. Nephrology following Patient was started on HD at the request of the family. Currently has a permanent dialysis catheter placed. Has a chair time outpatient as well. Awaiting SNF arrangements.

## 2021-01-26 NOTE — Assessment & Plan Note (Addendum)
Rate controlled with amiodarone. On Eliquis.  Continue.

## 2021-01-26 NOTE — TOC Progression Note (Signed)
Transition of Care [facility]) - Progression Note    Patient Details  Name: Sandra Massey MRN: [id number] Date of Birth: [birth year]  Transition of Care [facility]) CM/SW [place], [place] Phone Number: 01/26/2021, 1:35 PM  Clinical Narrative:     [facility] working with supervisor in assessing if patient can return to [facility], as she has been long term resident and is currently being turned away.   Findings so far indicate that:  Per the new regulations they should be required to take the patient back with the caveat that the patient needs to apply for Medicaid and they actually have to help assist the patient with that applying. I  So no they cannot simply send the patient out and refuse to take back. These updates are from 01/01/2021.  Below is the specific regulation:   F626--Permitting Residents to Return to the Facility CMS clarified the requirement to permit residents to return after hospitalization or therapeutic leave applies to all residents regardless of payment source. Of note: CMS added language to investigative procedure to help surveyors investigate situations where a facility does not permit return due to:  Lack of an available bed.  Inability to meet resident's needs.     CSW has emailed above to coverage at [facility] as both admissions persons are not working today. Email sent to [email or web address] and phone call completed at [phone]. [name] reports she will follow up with this information.    Expected Discharge Plan:  (TBD) Barriers to Discharge: Continued Medical Work up  Expected Discharge Plan and Services Expected Discharge Plan:  (TBD)       Living arrangements for the past 2 months: [place]                                       Social Determinants of Health (SDOH) Interventions    Readmission Risk Interventions No flowsheet data found.

## 2021-01-26 NOTE — TOC Progression Note (Signed)
Transition of Care [facility]) - Progression Note    Patient Details  Name: Sandra Massey MRN: [id number] Date of Birth: May 08, 1950  Transition of Care [facility]) CM/SW [place], [place] Phone Number: 01/26/2021, 8:58 AM  Clinical Narrative:     Otila Kluver at [facility] informed CSW that corporate is not allowing [facility] to take patient back and that she just learned this information.  Reports that patient is long term there but was private paying, reports patient's daughter was working on [employer] application but they have no records that it was ever started. Patient owes facility money and is unable to return due to this.   CSW inquired if we can go through [employer] for payor sources. Tina at [facility] reports no.   CSW has reached out to financial counselor to inquire if medicaid application was started for patient, they report no medicaid # on file but they have reached out to DSS to see if this was in processes.    Expected Discharge Plan:  (TBD) Barriers to Discharge: Continued Medical Work up  Expected Discharge Plan and Services Expected Discharge Plan:  (TBD)       Living arrangements for the past 2 months: [place]                                       Social Determinants of Health (SDOH) Interventions    Readmission Risk Interventions No flowsheet data found.

## 2021-01-27 DIAGNOSIS — I482 Chronic atrial fibrillation, unspecified: Secondary | ICD-10-CM

## 2021-01-27 DIAGNOSIS — R410 Disorientation, unspecified: Secondary | ICD-10-CM | POA: Diagnosis not present

## 2021-01-27 DIAGNOSIS — Z992 Dependence on renal dialysis: Secondary | ICD-10-CM

## 2021-01-27 DIAGNOSIS — I6930 Unspecified sequelae of cerebral infarction: Secondary | ICD-10-CM

## 2021-01-27 DIAGNOSIS — D631 Anemia in chronic kidney disease: Secondary | ICD-10-CM

## 2021-01-27 DIAGNOSIS — N186 End stage renal disease: Secondary | ICD-10-CM | POA: Diagnosis not present

## 2021-01-27 LAB — RENAL FUNCTION PANEL
Albumin: 2.1 g/dL — ABNORMAL LOW (ref 3.5–5.0)
Anion gap: 5 (ref 5–15)
BUN: 30 mg/dL — ABNORMAL HIGH (ref 8–23)
CO2: 31 mmol/L (ref 22–32)
Calcium: 9.4 mg/dL (ref 8.9–10.3)
Chloride: 102 mmol/L (ref 98–111)
Creatinine, Ser: 3.98 mg/dL — ABNORMAL HIGH (ref 0.44–1.00)
GFR, Estimated: 12 mL/min — ABNORMAL LOW (ref >60)
Glucose, Bld: 115 mg/dL — ABNORMAL HIGH (ref 70–99)
Phosphorus: 4.7 mg/dL — ABNORMAL HIGH (ref 2.5–4.6)
Potassium: 4.2 mmol/L (ref 3.5–5.1)
Sodium: 138 mmol/L (ref 135–145)

## 2021-01-27 LAB — GLUCOSE, CAPILLARY
Glucose-Capillary: 95 mg/dL (ref 70–99)
Glucose-Capillary: 97 mg/dL (ref 70–99)

## 2021-01-27 LAB — CBC
HCT: 26.4 % — ABNORMAL LOW (ref 36.0–46.0)
Hemoglobin: 7.9 g/dL — ABNORMAL LOW (ref 12.0–15.0)
MCH: 28.9 pg (ref 26.0–34.0)
MCHC: 29.9 g/dL — ABNORMAL LOW (ref 30.0–36.0)
MCV: 96.7 fL (ref 80.0–100.0)
Platelets: 383 10*3/uL (ref 150–400)
RBC: 2.73 MIL/uL — ABNORMAL LOW (ref 3.87–5.11)
RDW: 18.6 % — ABNORMAL HIGH (ref 11.5–15.5)
WBC: 8.8 10*3/uL (ref 4.0–10.5)
nRBC: 0 % (ref 0.0–0.2)

## 2021-01-27 LAB — MRSA NEXT GEN BY PCR, NASAL: MRSA by PCR Next Gen: NOT DETECTED

## 2021-01-27 MED ORDER — HEPARIN SODIUM (PORCINE) 1000 UNIT/ML IJ SOLN
INTRAMUSCULAR | Status: AC
  Filled 2021-01-27: qty 1

## 2021-01-27 NOTE — Progress Notes (Signed)
Progress Note    Sandra Massey   IRS:[id number]  DOB: 1950/07/16  DOA: 01/09/2021     18 Date of Service: 01/27/2021   Clinical Course  Sandra Massey is a 70 y.o. female with a PMH significant for Jehovah's Witness, CVA with residual right-sided hemiparesis, chronic A. fib, type II DM, HTN. They presented from SNF to the ED on 01/09/2021 with acute AMS. In the ED, it was found that they had no acute changes on head CT but abnormal electrical activity on EEG.  Neurology was consulted. They were treated with Keppra.  Additionally, patient was septic with blood cultures positive for E. coli.  She was started on IV ceftriaxone and nephrology was consulted. Patient was admitted to medicine service for further workup and management of sepsis with acute encephalopathy as outlined in detail below. Now medically stable.  Need to transfer to SNF. Patient would benefit from outpatient palliative care consultation for further clarity on goals of care.  11/19: Waiting for facility placement   Assessment and Plan * Acute delirium In the setting of sepsis. Mentation still not back to baseline although at baseline the patient is still not oriented and frequently agitated. Continue Seroquel. On as needed Haldol. Neurology was consulted. Extensive work-up performed throughout the hospital course including an EEG which showed possible epileptogenic foci and therefore patient was started on Keppra.  Obesity (BMI 30-39.9) Placing the patient at high risk of poor outcome Body mass index is 39.47 kg/m.   Anemia due to chronic kidney disease Patient is Jehova's witness. Hgb very gradually declining throughout hospitalization likely in part due to dilution with significant IV fluids. Hgb 9.2>8.7>8.8> 7.9 - CBC on HD days  Sepsis due to Escherichia coli (E. coli) ([place]) Sepsis present on admission with SIRS criteria met with tachycardia tachypnea and confusion. cultures positive for E. Coli. Sepsis  resolved. -Ancef (11/7-11/12), per ID - CTX (11/3-11/6) - cefepime (11/2) - flagyl 11/2 - vancomycin 11/2  ESRD (end stage renal disease) ([place]) Presented with sepsis.  Also had AKI at the time of admission. Renal function did not improve.  Encephalopathy worsened at throughout the course of the hospitalization. Nephrology following Patient was started on HD at the request of the family. Currently has a permanent dialysis catheter placed. Has a chair time outpatient as well. Awaiting SNF arrangements.   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 - assist with feeds -Continue home rosuvastatin.  Pressure injury of skin Sacral, stage II. Continue foam dressing.  Present on admission  Chronic a-fib (HCC) Rate controlled with amiodarone. On Eliquis.  Continue.     Subjective:  Reports some nausea and vomiting issue yesterday but not anymore  Objective Vitals:   01/27/21 1430 01/27/21 1445 01/27/21 1500 01/27/21 1519  BP: 124/61 (!) 111/55 117/78   Pulse: 62 73 67 60  Resp: '17 17 18 16  ' Temp:      TempSrc:      SpO2:      Weight:      Height:       114.3 kg  Vital signs were reviewed and unremarkable.   Exam Physical Exam   General: In no distress  Skin no Rash;  HEENT oral Mucosa Clear, moist. no Abnormal Neck Mass Or lumps, Conjunctiva normal  Cardiovascular: S1 and S2 Present, no Murmur, Respiratory: good respiratory effort, Bilateral Air entry present and CTA, no Crackles, no wheezes Abdomen: Bowel Sound present,  Soft and no tenderness Extremities: trace Pedal edema Neurology: alert and oriented to place and person affect tearful. no new focal deficit, chronic right-sided weakness  Labs / Other Information My review of labs, imaging, notes and other tests is significant for Hemoglobin 7.9     Disposition Plan: Status is: Inpatient  Remains  inpatient appropriate because: Waiting for SNF placement        Time spent: 35 minutes [facility] 01/27/2021, 4:33 PM

## 2021-01-27 NOTE — Progress Notes (Signed)
R groin dressing clean dry and intact

## 2021-01-27 NOTE — Progress Notes (Signed)
[facility]  PROGRESS NOTE   Subjective:   Seen and examined on hemodialysis treatment. Tolerating treatment well.     HEMODIALYSIS FLOWSHEET:  Blood Flow Rate (mL/min): 400 mL/min Arterial Pressure (mmHg): -140 mmHg Venous Pressure (mmHg): 130 mmHg Transmembrane Pressure (mmHg): 60 mmHg Ultrafiltration Rate (mL/min): 500 mL/min Dialysate Flow Rate (mL/min): 500 ml/min Conductivity: Machine : 13.7 Conductivity: Machine : 13.7 Dialysis Fluid Bolus: Normal Saline Bolus Amount (mL): 250 mL   Objective:  Vital signs in last 24 hours:  Temp:  [97.5 F (36.4 C)-99.7 F (37.6 C)] 97.5 F (36.4 C) (11/19 0834) Pulse Rate:  [60-68] 63 (11/19 1145) Resp:  [14-20] 16 (11/19 0834) BP: (109-124)/(48-70) 123/70 (11/19 1145) SpO2:  [95 %-100 %] 95 % (11/19 0834)  Weight change:  Filed Weights   01/23/21 0500 01/23/21 0845 01/24/21 0337  Weight: 113.9 kg 113.9 kg 114.3 kg    Intake/Output: I/O last 3 completed shifts: In: 450 [P.O.:240; IV Piggyback:210] Out: 800 [Urine:800]   Intake/Output this shift:  No intake/output data recorded.  Physical Exam: General:  No acute distress, laying in bed  Head:  Normocephalic, atraumatic. Moist oral mucosal membranes  Eyes:  Anicteric  Neck:  Supple  Lungs:   Clear to auscultation, normal effort  Heart:  regular  Abdomen:   Soft, nontender, bowel sounds present  Extremities:  peripheral edema.  Neurologic:  Awake, following commands, oriented to self and place  Skin:  No lesions  Access: RIJ permcath 11/15 Dr. Delana Meyer Right femoral temp HD catheter    Basic Metabolic Panel: Recent Labs  Lab 01/22/21 0936 01/23/21 0857 01/25/21 0805 01/27/21 0747  NA 135 135 137 138  K 4.6 4.9 4.2 4.2  CL 98 98 98 102  CO2 28 28 28 31   GLUCOSE 176* 135* 175* 115*  BUN 31* 41* 34* 30*  CREATININE 4.02* 4.73* 4.24* 3.98*  CALCIUM 8.8* 8.5* 8.4* 9.4  PHOS 5.1* 6.0* 4.7* 4.7*     CBC: Recent Labs  Lab 01/23/21 0857  01/25/21 0805 01/27/21 0747  WBC 12.4* 8.5 8.8  HGB 8.4* 7.7* 7.9*  HCT 26.6* 25.1* 26.4*  MCV 89.6 92.6 96.7  PLT 358 397 383      Urinalysis: No results for input(s): COLORURINE, LABSPEC, PHURINE, GLUCOSEU, HGBUR, BILIRUBINUR, KETONESUR, PROTEINUR, UROBILINOGEN, NITRITE, LEUKOCYTESUR in the last 72 hours.  Invalid input(s): APPERANCEUR    Imaging: No results found.   Medications:    sodium chloride 10 mL/hr at 01/17/21 0427    (feeding supplement) PROSource Plus  30 mL Oral TID BM   amiodarone  200 mg Oral BID   apixaban  5 mg Oral BID   Chlorhexidine Gluconate Cloth  6 each Topical Daily   docusate sodium  100 mg Oral Daily   epoetin (EPOGEN/PROCRIT) injection  10,000 Units Intravenous Q T,Th,Sa-HD   feeding supplement (NEPRO CARB STEADY)  237 mL Oral BID BM   heparin sodium (porcine)  2,000 Units Intracatheter Once   insulin aspart  0-15 Units Subcutaneous TID WC   insulin glargine-yfgn  15 Units Subcutaneous Daily   levETIRAcetam  250 mg Oral Once per day on Tue Thu Sat   levETIRAcetam  500 mg Oral Daily   multivitamin  1 tablet Oral QHS   QUEtiapine  12.5 mg Oral QHS   rosuvastatin  10 mg Oral QPM    Assessment/ Plan:     Principal Problem:   Acute delirium Active Problems:   Chronic a-fib (HCC)   Pressure injury of skin  History of ischemic cerebrovascular accident (CVA) with residual deficit   ESRD (end stage renal disease) ([place])   Sepsis due to Escherichia coli (E. coli) ([place])   Anemia due to chronic kidney disease   Obesity (BMI 30-39.9)   Ms. VERSA CRATON is a 70 y.o. white female SNF resident with atrial fibrillation, diabetes mellitus type II, hypertension, CVA who is admitted to [facility] on 01/09[facility]2021 for UTI (urinary tract infection) [N39.0] Speech abnormality [R47.9] MRI contraindicated due to metal implant [Z53.09] Sepsis ([place]) [A41.9] Cerebrovascular accident (CVA), unspecified mechanism ([place]) [I63.9] AMS (altered mental status)  [R41.82]  #1: End-stage renal disease: patient's family wants to pursue outpatient hemodialysis. ESRD secondary to acute kidney injury with no recovery.  - Continue TTS schedule: [name] 6:45 am - Remove femoral dialysis catheter.   #2: Hypertension with chronic kidney disease: Currently holding amlodipine.  #3: Diabetes mellitus type II with chronic kidney disease: insulin dependent. History of poor control.   - Continue glucose control  #4: Anemia with chronic kidney disease: hemoglobin 7.9. normocytic. Iron studies consistent with iron deficiency anemia - EPO with HD treatments.  - will start IV iron as outpatient.    LOS: [place], [facility] 11/19/202212:04 PM

## 2021-01-28 DIAGNOSIS — I482 Chronic atrial fibrillation, unspecified: Secondary | ICD-10-CM | POA: Diagnosis not present

## 2021-01-28 DIAGNOSIS — N186 End stage renal disease: Secondary | ICD-10-CM | POA: Diagnosis not present

## 2021-01-28 DIAGNOSIS — D631 Anemia in chronic kidney disease: Secondary | ICD-10-CM | POA: Diagnosis not present

## 2021-01-28 DIAGNOSIS — R410 Disorientation, unspecified: Secondary | ICD-10-CM | POA: Diagnosis not present

## 2021-01-28 LAB — GLUCOSE, CAPILLARY
Glucose-Capillary: 159 mg/dL — ABNORMAL HIGH (ref 70–99)
Glucose-Capillary: 143 mg/dL — ABNORMAL HIGH (ref 70–99)
Glucose-Capillary: 207 mg/dL — ABNORMAL HIGH (ref 70–99)
Glucose-Capillary: 118 mg/dL — ABNORMAL HIGH (ref 70–99)

## 2021-01-28 NOTE — Assessment & Plan Note (Signed)
Patient is Jehova's witness. Hgb very gradually declining throughout hospitalization likely in part due to dilution with significant IV fluids. Hgb 9.2>8.7>8.8> 7.9 - CBC on HD days.

## 2021-01-28 NOTE — Assessment & Plan Note (Signed)
Sacral, stage II. Continue foam dressing.

## 2021-01-28 NOTE — Assessment & Plan Note (Signed)
In the setting of sepsis. Intermittent agitation. Continue Seroquel & as needed Haldol. Neurology- Extensive work-up performed throughout the hospital course including an EEG which showed possible epileptogenic foci and therefore neuro started on Keppra.

## 2021-01-28 NOTE — Assessment & Plan Note (Signed)
Rate controlled with amiodarone. On Eliquis.

## 2021-01-28 NOTE — Assessment & Plan Note (Signed)
Sepsis present on admission with SIRS criteria met with tachycardia tachypnea and confusion. cultures positive for E. Coli. Sepsis resolved. -Ancef (11/7-11/12), per ID - CTX (11/3-11/6) - cefepime (11/2) - flagyl 11/2 - vancomycin 11/2.

## 2021-01-28 NOTE — Assessment & Plan Note (Signed)
Presented with sepsis.  Also had AKI at the time of admission. Renal function did not improve.  Encephalopathy worsened at throughout the course of the hospitalization. Nephrology following - HD y'day Patient was started on HD at the request of the family. Currently has a permanent dialysis catheter placed. Has a chair time outpatient as well. Awaiting SNF arrangements.

## 2021-01-28 NOTE — Progress Notes (Signed)
[facility]  PROGRESS NOTE   Subjective:   Hemodialysis treatment yesterday. Tolerated treatment well. UF of 1 liter. In bed.   Right femoral HD catheter removed.   Patient states she has no complaints this morning.   Objective:  Vital signs in last 24 hours:  Temp:  [97.5 F (36.4 C)-98.6 F (37 C)] 98.6 F (37 C) (11/20 1136) Pulse Rate:  [53-85] 66 (11/20 1136) Resp:  [15-20] 20 (11/20 1136) BP: (91-125)/(49-85) 103/64 (11/20 1136) SpO2:  [94 %-99 %] 94 % (11/20 1136)  Weight change:  Filed Weights   01/23/21 0500 01/23/21 0845 01/24/21 0337  Weight: 113.9 kg 113.9 kg 114.3 kg    Intake/Output: I/O last 3 completed shifts: In: -  Out: 1004 [Other:1004]   Intake/Output this shift:  No intake/output data recorded.  Physical Exam: General:  No acute distress, laying in bed  Head:  Normocephalic, atraumatic. Moist oral mucosal membranes  Eyes:  Anicteric  Neck:  Supple  Lungs:   Clear to auscultation, normal effort  Heart:  regular  Abdomen:   Soft, nontender, bowel sounds present  Extremities:  peripheral edema.  Neurologic:  oriented to self and place  Skin:  No lesions  Access: RIJ permcath 11/15 Dr. Delana Meyer Right femoral temp HD catheter    Basic Metabolic Panel: Recent Labs  Lab 01/22/21 0936 01/23/21 0857 01/25/21 0805 01/27/21 0747  NA 135 135 137 138  K 4.6 4.9 4.2 4.2  CL 98 98 98 102  CO2 28 28 28 31   GLUCOSE 176* 135* 175* 115*  BUN 31* 41* 34* 30*  CREATININE 4.02* 4.73* 4.24* 3.98*  CALCIUM 8.8* 8.5* 8.4* 9.4  PHOS 5.1* 6.0* 4.7* 4.7*     CBC: Recent Labs  Lab 01/23/21 0857 01/25/21 0805 01/27/21 0747  WBC 12.4* 8.5 8.8  HGB 8.4* 7.7* 7.9*  HCT 26.6* 25.1* 26.4*  MCV 89.6 92.6 96.7  PLT 358 397 383      Urinalysis: No results for input(s): COLORURINE, LABSPEC, PHURINE, GLUCOSEU, HGBUR, BILIRUBINUR, KETONESUR, PROTEINUR, UROBILINOGEN, NITRITE, LEUKOCYTESUR in the last 72 hours.  Invalid input(s):  APPERANCEUR    Imaging: No results found.   Medications:    sodium chloride 10 mL/hr at 01/17/21 0427    (feeding supplement) PROSource Plus  30 mL Oral TID BM   amiodarone  200 mg Oral BID   apixaban  5 mg Oral BID   Chlorhexidine Gluconate Cloth  6 each Topical Daily   docusate sodium  100 mg Oral Daily   epoetin (EPOGEN/PROCRIT) injection  10,000 Units Intravenous Q T,Th,Sa-HD   feeding supplement (NEPRO CARB STEADY)  237 mL Oral BID BM   heparin sodium (porcine)  2,000 Units Intracatheter Once   insulin aspart  0-15 Units Subcutaneous TID WC   insulin glargine-yfgn  15 Units Subcutaneous Daily   levETIRAcetam  250 mg Oral Once per day on Tue Thu Sat   levETIRAcetam  500 mg Oral Daily   multivitamin  1 tablet Oral QHS   QUEtiapine  12.5 mg Oral QHS   rosuvastatin  10 mg Oral QPM    Assessment/ Plan:     Principal Problem:   Acute delirium Active Problems:   Chronic a-fib (HCC)   Pressure injury of skin   History of ischemic cerebrovascular accident (CVA) with residual deficit   ESRD (end stage renal disease) ([place])   Sepsis due to Escherichia coli (E. coli) ([place])   Anemia due to chronic kidney disease   Obesity (BMI 30-39.9)  [name] is a 70 y.o. white female SNF resident with atrial fibrillation, diabetes mellitus type II, hypertension, CVA who is admitted to [facility] on 01/09/2021 for UTI (urinary tract infection) [N39.0] Speech abnormality [R47.9] MRI contraindicated due to metal implant [Z53.09] Sepsis ([place]) [A41.9] Cerebrovascular accident (CVA), unspecified mechanism ([place]) [I63.9] AMS (altered mental status) [R41.82]  #1: End-stage renal disease: patient's family wants to pursue outpatient hemodialysis. ESRD secondary to acute kidney injury with no recovery.  - Continue TTS schedule: outpatient planning [facility] 6:45 am - Patient to live at [facility] with her sister as her roommate.   #2: Hypertension with chronic kidney disease: 103/64.  Currently holding amlodipine.  #3: Diabetes mellitus type II with chronic kidney disease: insulin dependent. History of poor control.   - Continue glucose control  #4: Anemia with chronic kidney disease: hemoglobin 7.9. normocytic. Iron studies consistent with iron deficiency anemia. Hemoglobin A1c of 6.9%.  - EPO with HD treatments.  - will start IV iron as outpatient.    LOS: [place], [facility] 11/20/20221:21 PM

## 2021-01-28 NOTE — Assessment & Plan Note (Signed)
Placing the patient at high risk of poor outcome. Body mass index is 39.47 kg/m.

## 2021-01-28 NOTE — Assessment & Plan Note (Signed)
residual right-sided hemiparesis and expressive aphasia as well as dysphagia. - assist with feeds. -Continue home rosuvastatin.

## 2021-01-28 NOTE — Progress Notes (Signed)
Progress Note    Sandra Massey   MRN:[id number]  DOB: 01/05/1951  DOA: 01/09/2021     19 Date of Service: 01/28/2021   Clinical Course  Sandra Massey is a 69 y.o. female with a PMH significant for Jehovah's Witness, CVA with residual right-sided hemiparesis, chronic A. fib, type II DM, HTN. They presented from SNF to the ED on 01/09/2021 with acute AMS. In the ED, it was found that they had no acute changes on head CT but abnormal electrical activity on EEG.  Neurology was consulted. They were treated with Keppra.  Additionally, patient was septic with blood cultures positive for E. coli.  She was started on IV ceftriaxone and nephrology was consulted. Patient was admitted to medicine service for further workup and management of sepsis with acute encephalopathy as outlined in detail below. Now medically stable.  Need to transfer to SNF. Patient would benefit from outpatient palliative care consultation for further clarity on goals of care.  11/19: HD today 11/20 - Waiting for facility placement   Assessment and Plan * Acute delirium In the setting of sepsis. Intermittent agitation. Continue Seroquel & as needed Haldol. Neurology- Extensive work-up performed throughout the hospital course including an EEG which showed possible epileptogenic foci and therefore neuro started on Keppra.  Obesity (BMI 30-39.9) Placing the patient at high risk of poor outcome. Body mass index is 39.47 kg/m.   Anemia due to chronic kidney disease Patient is Jehova's witness. Hgb very gradually declining throughout hospitalization likely in part due to dilution with significant IV fluids. Hgb 9.2>8.7>8.8> 7.9 - CBC on HD days.  Sepsis due to Escherichia coli (E. coli) (HCC) Sepsis present on admission with SIRS criteria met with tachycardia tachypnea and confusion. cultures positive for E. Coli. Sepsis resolved. -Ancef (11/7-11/12), per ID - CTX (11/3-11/6) - cefepime (11/2) - flagyl 11/2 -  vancomycin 11/2.  ESRD (end stage renal disease) (HCC) Presented with sepsis.  Also had AKI at the time of admission. Renal function did not improve.  Encephalopathy worsened at throughout the course of the hospitalization. Nephrology following - HD y'day Patient was started on HD at the request of the family. Currently has a permanent dialysis catheter placed. Has a chair time outpatient as well. Awaiting SNF arrangements.   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 - assist with feeds. -Continue home rosuvastatin.  Pressure injury of skin Sacral, stage II. Continue foam dressing.    Chronic a-fib (HCC) Rate controlled with amiodarone. On Eliquis.      Subjective:  No new issues. Intermittent confusion/agitation  Objective Vitals:   01/27/21 1924 01/27/21 2341 01/28/21 0308 01/28/21 0800  BP: (!) 91/51 (!) 105/56 (!) 109/49 (!) 120/50  Pulse: 71 64 64 66  Resp: 18 18 18 18  Temp: 97.8 F (36.6 C) 98.1 F (36.7 C) 97.6 F (36.4 C) (!) 97.5 F (36.4 C)  TempSrc:  Oral    SpO2: 99% 97% 98% 96%  Weight:      Height:       114.3 kg  Vital signs were reviewed and unremarkable.   Exam Physical Exam   General: In no distress  Skin noRash;  HEENT oral Mucosa Clear, moist. no Abnormal Neck Mass Or lumps, Conjunctiva normal  Cardiovascular:S1 and S2 Present, noMurmur, Respiratory:goodrespiratory effort, Bilateral Air entry present and CTA, noCrackles,nowheezes Abdomen:Bowel Sound present, Soft and notenderness Extremities:tracePedal edema Neurology:alert and awake. nonfocal Access - RIJ permcath     Labs / Other Information There are no new results to review at this time.   Disposition Plan: Status is: Inpatient  Remains inpatient appropriate because: waiting for SNF        Time spent: 25 minutes [facility] 01/28/2021,  9:55 AM  

## 2021-01-29 LAB — GLUCOSE, CAPILLARY
Glucose-Capillary: 123 mg/dL — ABNORMAL HIGH (ref 70–99)
Glucose-Capillary: 183 mg/dL — ABNORMAL HIGH (ref 70–99)
Glucose-Capillary: 197 mg/dL — ABNORMAL HIGH (ref 70–99)

## 2021-01-29 LAB — RESP PANEL BY RT-PCR (FLU A&B, COVID) ARPGX2
Influenza A by PCR: NEGATIVE
Influenza B by PCR: NEGATIVE
SARS Coronavirus 2 by RT PCR: NEGATIVE

## 2021-01-29 MED ORDER — LEVEMIR FLEXTOUCH 100 UNIT/ML ~~LOC~~ SOPN: 15.0000 [IU] | PEN_INJECTOR | Freq: Every day | SUBCUTANEOUS | 11 refills | Status: AC

## 2021-01-29 MED ORDER — NEPRO/CARBSTEADY PO LIQD: 237.0000 mL | Freq: Two times a day (BID) | ORAL | 0 refills | Status: AC

## 2021-01-29 MED ORDER — LEVETIRACETAM 500 MG PO TABS: 500.0000 mg | ORAL_TABLET | Freq: Every day | ORAL | 0 refills | Status: AC

## 2021-01-29 MED ORDER — DOCUSATE SODIUM 100 MG PO CAPS: 100.0000 mg | ORAL_CAPSULE | Freq: Every day | ORAL | 0 refills | Status: AC

## 2021-01-29 MED ORDER — LEVETIRACETAM 250 MG PO TABS: 250.0000 mg | ORAL_TABLET | ORAL | 0 refills | Status: AC

## 2021-01-29 MED ORDER — GABAPENTIN 100 MG PO CAPS: 100.0000 mg | ORAL_CAPSULE | Freq: Two times a day (BID) | ORAL | 0 refills | Status: AC

## 2021-01-29 NOTE — Discharge Summary (Addendum)
Physician Discharge Summary   Patient name: Sandra Massey  Admit date:     01/09/2021  Discharge date: 01/29/2021  Discharge Physician: Berle Mull   PCP: [name], MD   Recommendations at discharge: pt will benefit from having Palliative care consultation   Discharge Diagnoses Principal Problem:   Acute delirium Active Problems:   Sepsis due to Escherichia coli (E. 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 [facility] Course   Sandra Massey is a 70 y.o. female with a PMH significant for Jehovah's Witness, CVA with residual right-sided hemiparesis, chronic A. fib, type II DM, HTN. They presented from SNF to the ED on 01/09/2021 with acute AMS. In the ED, it was found that they had no acute changes on head CT but abnormal electrical activity on EEG.  Neurology was consulted. They were treated with Keppra.  Additionally, patient was septic with blood cultures positive for E. coli.  She was started on IV ceftriaxone and nephrology was consulted. Patient was admitted to medicine service for further workup and management of sepsis with acute encephalopathy as outlined in detail below. Now medically stable.  Need to transfer to SNF. Patient would benefit from outpatient palliative care consultation for further clarity on goals of care.  11/19: HD today 11/20 - Waiting for facility placement   * Acute delirium In the setting of sepsis. Intermittent agitation. Continue Seroquel & as needed Haldol. Neurology- Extensive work-up performed throughout the hospital course including an EEG which showed possible epileptogenic foci and therefore neuro started on Keppra.  Sepsis due to Escherichia coli (E. coli)  ([place]) Sepsis present on admission with SIRS criteria met with tachycardia tachypnea and confusion. cultures positive for E. Coli. Sepsis resolved. -Ancef (11/7-11/12), per ID - CTX (11/3-11/6) - cefepime (11/2) - flagyl 11/2 - vancomycin 11/2.  ESRD (end stage renal disease) ([place]) Presented with sepsis.  Also had AKI at the time of admission. Renal function did not improve.  Encephalopathy worsened at throughout the course of the hospitalization. Nephrology following - HD y'day Patient was started on HD at the request of the family. Currently has a permanent dialysis catheter placed. Has a chair time outpatient as well. Awaiting SNF arrangements.   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 - assist with feeds. -Continue home rosuvastatin.  Chronic a-fib (HCC) Rate controlled with amiodarone. On Eliquis.   Anemia due to chronic kidney disease Patient is Jehova's witness. Hgb very gradually declining throughout hospitalization likely in part due to dilution with significant IV fluids. Hgb 9.2>8.7>8.8> 7.7> 7.9.  Nephrology planning outpatient IV iron  Pressure injury of skin Sacral, stage II. Continue foam dressing.    Obesity (BMI 30-39.9) Placing the patient at high risk of poor outcome. Body mass index is 39.47 kg/m.   Procedures performed: HD catheter placement    Condition at discharge: fair  Exam General: Appear in mild distress, no Rash; Oral Mucosa Clear, moist. no Abnormal Neck Mass Or lumps, Conjunctiva normal  Cardiovascular: S1 and S2 Present, no Murmur, Respiratory: good respiratory effort, Bilateral Air entry present and CTA, no Crackles, no wheezes Abdomen: Bowel Sound present,  Soft and no tenderness Extremities: no Pedal edema Neurology: alert and oriented to place, and person affect appropriate. no new focal deficit, chronic right sided  weakness and expressive aphasia  Gait not checked due to patient safety concerns    Disposition: Skilled nursing facility  Discharge time: greater than 30 minutes.  Contact information for follow-up providers     Schnier, Dolores Lory, MD Follow up in 3 week(s).   Specialties: Vascular Surgery, Cardiology, Radiology, Vascular Surgery Why: with vein mapping first access Contact information: [place] [address] [phone]              Contact information for after-discharge care     Destination     [facility] Preferred SNF .   Service: Skilled Nursing Contact information: [address] [place] [phone]                     Allergies as of 01/29/2021       Reactions   Latex Rash        Medication List     STOP taking these medications    amLODipine 10 MG tablet Commonly known as: NORVASC   ascorbic acid 500 MG tablet Commonly known as: VITAMIN C   insulin aspart 100 UNIT/ML FlexPen Commonly known as: NOVOLOG   insulin glargine 100 UNIT/ML Solostar Pen Commonly known as: LANTUS   sertraline 50 MG tablet Commonly known as: ZOLOFT       TAKE these medications    amiodarone 200 MG tablet Commonly known as: PACERONE Take 1 tablet (200 mg total) by mouth 2 (two) times daily.   cetirizine 5 MG tablet Commonly known as: ZYRTEC Take 5 mg by mouth daily.   docusate sodium 100 MG capsule Commonly known as: COLACE Take 1 capsule (100 mg total) by mouth daily. Start taking on: January 30, 2021   Eliquis 5 MG Tabs tablet Generic drug: apixaban Take 5 mg by mouth 2 (two) times daily.   feeding supplement (NEPRO CARB STEADY) Liqd Take 237 mLs by mouth 2 (two) times daily between meals.   gabapentin 100 MG capsule Commonly known as: Neurontin Take 1 capsule (100 mg total) by mouth 2 (two) times daily.   Insulin Pen Needle 31G X 8 MM Misc 1 needle with each injection of  insulin   Levemir FlexTouch 100 UNIT/ML FlexPen Generic drug: insulin detemir Inject 15 Units into the skin daily at 12 noon. What changed: how much to take   levETIRAcetam 500 MG tablet Commonly known as: KEPPRA Take 1 tablet (500 mg total) by mouth daily. Also take 250 mg after HD on dialysis days Tuesday, Thursday and Saturday Start taking on: January 30, 2021   levETIRAcetam 250 MG tablet Commonly known as: KEPPRA Take 1 tablet (250 mg total) by mouth 3 (three) times a week. On dialysis days, after HD, Tuesday, Thursday and Saturday Start taking on: January 30, 2021   metFORMIN 1000 MG tablet Commonly known as: GLUCOPHAGE Take 1,000 mg by mouth 2 (two) times daily.   multivitamin with minerals Tabs tablet Take 1 tablet by mouth daily.   NovoLIN R 100 units/mL injection Generic drug: insulin regular Inject 2-10 Units into the skin 4 (four) times daily -  before meals and at bedtime.   rosuvastatin 40 MG tablet Commonly known as: CRESTOR Take 40 mg by mouth every evening.               Discharge  Care Instructions  (From admission, onward)           Start     Ordered   01/29/21 0000  Discharge wound care:       Comments: Keep skin dry and continue frequent position changes and foam dressing.   01/29/21 1254            CT ABDOMEN PELVIS WO CONTRAST  Result Date: 01/12/2021 CLINICAL DATA:  E coli bacteremia. Acute on chronic kidney disease. Acute abdominal pain. EXAM: CT ABDOMEN AND PELVIS WITHOUT CONTRAST TECHNIQUE: Multidetector CT imaging of the abdomen and pelvis was performed following the standard protocol without IV contrast. COMPARISON:  05/31/2019 FINDINGS: Body habitus reduces diagnostic sensitivity and specificity. Lower chest: Mild to moderate cardiomegaly. Small bilateral pleural effusions, right greater than left, with associated passive atelectasis. Hepatobiliary: Cholecystectomy.  No focal liver lesion identified. Pancreas: Unremarkable  Spleen: Unremarkable Adrenals/Urinary Tract: Small amount of gas in the urinary bladder, query recent catheterization. Exophytic 2 cm lesion from the left kidney upper pole is fluid density and probably a cyst, similar to prior. No current hydronephrosis or hydroureter. 1.4 cm in long axis renal calculus in the right renal pelvis, previously in the right kidney lower pole. This is near the UPJ but is not associated with hydronephrosis. There is also a 2 mm right kidney lower pole calculus. 3 mm left kidney lower pole calculus similar to prior. No discrete ureteral or bladder calculus. Stomach/Bowel: Sigmoid colon diverticulosis without active diverticulitis identified. No dilated bowel. Vascular/Lymphatic: Atherosclerosis is present, including aortoiliac atherosclerotic disease. Reproductive: Unremarkable Other: Nonspecific presacral edema. Subcutaneous edema along the flanks and hip regions. Musculoskeletal: Old healed left lower rib fractures. Interval 30% compression fracture eccentric to the right at the L1 vertebral level with associated vertebral sclerosis. Fracture involves the inferior endplate and the posterior vertebral body margin indicating anterior and middle column involvement. No substantial degree of bony retropulsion. Grade 1 degenerative anterolisthesis at L4-5 is chronic. Thoracic and lumbar spondylosis noted. IMPRESSION: 1. 1.4 cm in long axis right renal calculus has migrated to the right renal pelvis. This sits next to UPJ but is not causing hydronephrosis. Additional 2 mm right and 3 mm left kidney lower pole calculi likewise appear nonobstructive. 2. Sigmoid colon diverticulosis is present without definite active diverticulitis. 3. Interval 30% compression fracture at L1 with anterior and middle column involvement. No significant bony retropulsion. 4. Mild to moderate cardiomegaly with small bilateral pleural effusions, right greater than left. 5. Subcutaneous edema along the flanks and hips,  along with some nonspecific edema in the presacral space probably reflecting mild third spacing of fluids. Electronically Signed   By: Van Clines M.D.   On: 01/12/2021 17:23   DG Chest 1 View  Result Date: 01/13/2021 CLINICAL DATA:  Speech difficulty, acute renal failure EXAM: PORTABLE CHEST 1 VIEW COMPARISON:  01/09/2021 FINDINGS: Cardiac shadow is enlarged but stable. Aortic calcifications are again seen. Lungs are well aerated bilaterally. Mild increased vascular congestion is seen without focal infiltrate or effusion. Old left rib fracture is seen. IMPRESSION: Mild vascular congestion without parenchymal edema. Electronically Signed   By: Inez Catalina M.D.   On: 01/13/2021 20:06   DG Abd 1 View  Result Date: 01/09/2021 CLINICAL DATA:  MRI clearance. EXAM: ABDOMEN - 1 VIEW COMPARISON:  05/31/2019 FINDINGS: 16 mm stone projects over the lower pole of the right kidney. Prior cholecystectomy. Prominent small bowel loops in the left abdomen. Gas within nondistended large bowel. No organomegaly or free air.  IMPRESSION: Cholecystectomy clips in the right upper quadrant. No other radiopaque foreign body. Right lower pole nephrolithiasis. Electronically Signed   By: Rolm Baptise M.D.   On: 01/09/2021 19:30   MR BRAIN WO CONTRAST  Result Date: 01/09/2021 CLINICAL DATA:  Acute neurologic deficit EXAM: MRI HEAD WITHOUT CONTRAST TECHNIQUE: Multiplanar, multiecho pulse sequences of the brain and surrounding structures were obtained without intravenous contrast. COMPARISON:  None. FINDINGS: Brain: No acute infarct, mass effect or extra-axial collection. 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 Small amount of associated chronic blood products. Moderate bilateral periventricular white matter hyperintensity. Ex vacuo dilatation of the left lateral ventricle. Vascular: Major flow voids are preserved. Skull and upper cervical spine: Normal calvarium and skull base. Visualized upper cervical  spine and soft tissues are normal. Sinuses/Orbits:No paranasal sinus fluid levels or advanced mucosal thickening. No mastoid or middle ear effusion. Normal orbits. IMPRESSION: 1. No acute intracranial abnormality. 2. 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 Electronically Signed   By: Ulyses Jarred M.D.   On: 01/09/2021 20:50   US RENAL  Result Date: 01/10/2021 CLINICAL DATA:  UTI. EXAM: RENAL / URINARY TRACT ULTRASOUND COMPLETE COMPARISON:  None. FINDINGS: Right Kidney: Renal measurements: 11.5 cm x 6.1 cm x 6.0 cm = volume: 222 mL. Diffusely increased echogenicity of the renal parenchyma is noted. No mass or hydronephrosis visualized. Left Kidney: Renal measurements: 11.1 cm x 6.0 cm x 4.9 cm = volume: 172 mL. Echogenicity within normal limits. A 2.0 cm x 1.6 cm x 1.6 cm anechoic structure is seen within the upper pole of the left kidney. No hydronephrosis is visualized. Bladder: Appears normal for degree of bladder distention. Other: The study is limited secondary to the patient's body habitus. IMPRESSION: 1. Diffusely increased echogenicity of the right kidney which may be secondary to a diffuse infectious or inflammatory process. Medical renal disease cannot be excluded. 2. Small left renal cyst. Electronically Signed   By: Virgina Norfolk M.D.   On: 01/10/2021 15:46   PERIPHERAL VASCULAR CATHETERIZATION  Result Date: 01/23/2021 See surgical note for result.  DG Chest Port 1 View  Result Date: 01/09/2021 CLINICAL DATA:  Shortness of breath.  Slurred speech. EXAM: PORTABLE CHEST 1 VIEW COMPARISON:  04/06/2020 FINDINGS: Stable mild cardiomegaly. Aortic atherosclerotic calcification noted. Both lungs are clear. IMPRESSION: Stable mild cardiomegaly. No active lung disease. Electronically Signed   By: Marlaine Hind M.D.   On: 01/09/2021 14:51   EEG adult  Result Date: 01/10/2021 [name], MD     01/10/2021  1:42 PM Routine EEG Report JACKSON FETTERS is a 70 y.o. female with a  history of spells who is undergoing an EEG to evaluate for seizures. Report: This EEG was acquired with electrodes placed according to the International 10-20 electrode system (including Fp1, Fp2, F3, F4, C3, C4, P3, P4, O1, O2, T3, T4, T5, T6, A1, A2, Fz, Cz, Pz). The following electrodes were missing or displaced: none. The occipital dominant rhythm was 6 Hz. This activity is reactive to stimulation. Drowsiness was manifested by background fragmentation; deeper stages of sleep were identified by K complexes and sleep spindles. There was focal slowing over the left temporoparietal region. There were sharp waves noted occasionally (at times, frequently) in the left temporoparietal region. There were no electrographic seizures identified. Photic stimulation and hyperventilation were not performed. Impression and clinical correlation: This EEG was obtained while awake and asleep and is abnormal due to: - Mild diffuse slowing, indicative of global cerebral  dysfunction - Left temporoparietal slowing indicative of superimposed focal cerebral dysfunction in that area - Sharp waves located over the left temporoparietal region indicating increased epileptogenicity in that area There were no electrographic seizures observed during this recording. [name], MD [facility] [phone] If 7pm- 7am, please page neurology on call as listed in [place].   EEG adult  Result Date: 01/09/2021 Lora Havens, MD     01/09/2021  7:18 PM Patient Name: Sandra Massey MRN: [id number] Epilepsy Attending: Lora Havens Referring Physician/Provider: Dr Kerney Elbe Date: 01/09/2021 Duration: 20.56 mins Patient history: 70 year old female SNF resident with a history of atrial fibrillation, on Eliquis, who presents with acute onset of altered speech.  EEG to evaluate for seizure. Level of alertness: Awake, asleep AEDs during EEG study: None Technical aspects: This EEG study was done with scalp electrodes positioned  according to the 10-20 International system of electrode placement. Electrical activity was acquired at a sampling rate of _0  and reviewed with a high frequency filter of _1  and a low frequency filter of _2 . EEG data were recorded continuously and digitally stored. Description: The posterior dominant rhythm consists of 8 Hz activity of moderate voltage (25-35 uV) seen predominantly in posterior head regions, asymmetric ( left<right) and reactive to eye opening and eye closing. Sleep was characterized by vertex waves, sleep spindles (12 to 14 Hz), maximal frontocentral region.  EEG showed continuous 3 to 6 Hz theta-delta slowing in left hemisphere, maximal left posterior quadrant. Spikes were also noted in left posterior temporal region. Physiologic photic driving was not seen during photic stimulation. Hyperventilation was not performed.   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 There is also cortical dysfunction arising from left hemisphere, maximal left posterior quadrant,  likely secondary to underlying structural abnormality, post-ictal state. No seizures were seen throughout the recording. Dr Cheral Marker was notified. Lora Havens   CT HEAD CODE STROKE WO CONTRAST  Result Date: 01/09/2021 CLINICAL DATA:  Code stroke. EXAM: CT HEAD WITHOUT CONTRAST TECHNIQUE: Contiguous axial images were obtained from the base of the skull through the vertex without intravenous contrast. COMPARISON:  04/06/2020 FINDINGS: Brain: There is no acute intracranial hemorrhage, mass effect, or edema. Chronic infarct left MCA territory with associated volume loss. No new loss of gray-white differentiation. Additional areas low-density in the supratentorial white matter are nonspecific but probably reflect stable  chronic microvascular ischemic changes. Vascular: No hyperdense vessel. There is intracranial atherosclerotic calcification at the skull base. Skull: Unremarkable. Sinuses/Orbits: Aerated.  Orbits are unremarkable. Other: Mastoid air cells are clear. ASPECTS ([place] Stroke Program Early CT Score) - Ganglionic level infarction (caudate, lentiform nuclei, internal capsule, insula, M1-M3 cortex): 7 - Supraganglionic infarction (M4-M6 cortex): 3 Total score (0-10 with 10 being normal): 10 IMPRESSION: There is no acute intracranial hemorrhage or evidence of acute infarction. ASPECT score is 10. 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 These results were communicated to Dr. Cheral Marker at 2:08 pm on 01/09/2021 by text page via the [facility] messaging system. Electronically Signed   By: Macy Mis M.D.   On: 01/09/2021 14:14   Results for orders placed or performed during the hospital encounter of 01/09/21  Resp Panel by RT-PCR (Flu A&B, Covid) Nasopharyngeal Swab     Status: None   Collection Time: 01/09/21  2:17 PM  Specimen: Nasopharyngeal Swab; Nasopharyngeal(NP) swabs in vial transport medium  Result Value Ref Range Status   SARS Coronavirus 2 by RT PCR NEGATIVE NEGATIVE Final    Comment: (NOTE) SARS-CoV-2 target nucleic acids are NOT DETECTED.  The SARS-CoV-2 RNA is generally detectable in upper respiratory specimens during the acute phase of infection. The lowest concentration of SARS-CoV-2 viral copies this assay can detect is 138 copies/mL. A negative result does not preclude SARS-Cov-2 infection and should not be used as the sole basis for treatment or other patient management decisions. A negative result may occur with  improper specimen collection/handling, submission of specimen other than nasopharyngeal swab, presence of viral mutation(s) within the areas targeted by this assay, and inadequate number of viral copies(<138 copies/mL). A  negative result must be combined with clinical observations, patient history, and epidemiological information. The expected result is Negative.  Fact Sheet for Patients:  EntrepreneurPulse.com.au  Fact Sheet for Healthcare Providers:  IncredibleEmployment.be  This test is no t yet approved or cleared by the [place] FDA and  has been authorized for detection and/or diagnosis of SARS-CoV-2 by FDA under an Emergency Use Authorization (EUA). This EUA will remain  in effect (meaning this test can be used) for the duration of the COVID-19 declaration under Section 564(b)(1) of the Act, 21 U.S.C.section 360bbb-3(b)(1), unless the authorization is terminated  or revoked sooner.       Influenza A by PCR NEGATIVE NEGATIVE Final   Influenza B by PCR NEGATIVE NEGATIVE Final    Comment: (NOTE) The Xpert Xpress SARS-CoV-2/FLU/RSV plus assay is intended as an aid in the diagnosis of influenza from Nasopharyngeal swab specimens and should not be used as a sole basis for treatment. Nasal washings and aspirates are unacceptable for Xpert Xpress SARS-CoV-2/FLU/RSV testing.  Fact Sheet for Patients: EntrepreneurPulse.com.au  Fact Sheet for Healthcare Providers: IncredibleEmployment.be  This test is not yet approved or cleared by the [place] FDA and has been authorized for detection and/or diagnosis of SARS-CoV-2 by FDA under an Emergency Use Authorization (EUA). This EUA will remain in effect (meaning this test can be used) for the duration of the COVID-19 declaration under Section 564(b)(1) of the Act, 21 U.S.C. section 360bbb-3(b)(1), unless the authorization is terminated or revoked.  Performed at [facility], [address] [place], [place] [address]   Culture, blood (Routine X 2) w Reflex to ID Panel     Status: Abnormal   Collection Time: 01/10/21 12:09 AM   Specimen: BLOOD  Result Value  Ref Range Status   Specimen Description   Final    BLOOD RIGHT FOREARM Performed at [facility], [address], [place], [place] [address]    Special Requests   Final    BOTTLES DRAWN AEROBIC AND ANAEROBIC Blood Culture results may not be optimal due to an excessive volume of blood received in culture bottles Performed at [facility], [address] [place], [place] [address]    Culture  Setup Time   Final    GRAM NEGATIVE RODS IN BOTH AEROBIC AND ANAEROBIC BOTTLES CRITICAL RESULT CALLED TO, READ BACK BY AND VERIFIED WITH: PHARMD S HALLHAI [id number] AT 815 AM BY CM    Culture (A)  Final    ESCHERICHIA COLI SUSCEPTIBILITIES PERFORMED ON PREVIOUS CULTURE WITHIN THE LAST 5 DAYS. Performed at [facility], [place] [address], [place], [place] [address]    Report Status 01/13/2021 FINAL  Final  Culture, blood (Routine X 2) w Reflex to ID  Panel     Status: Abnormal   Collection Time: 01/10/21 12:09 AM   Specimen: BLOOD  Result Value Ref Range Status   Specimen Description   Final    BLOOD LEFT ANTECUBITAL Performed at [facility], [place] [address] [place], [place] [address]    Special Requests   Final    BOTTLES DRAWN AEROBIC AND ANAEROBIC Blood Culture adequate volume Performed at [place] [place], [place] [address]    Culture  Setup Time   Final    GRAM NEGATIVE RODS IN BOTH AEROBIC AND ANAEROBIC BOTTLES CRITICAL RESULT CALLED TO, READ BACK BY AND VERIFIED WITH: RAQUEL GOODMAN, PHARMD AT 1350 ON 01/10/21 BY GM Performed at [facility], [place] [address], [place], [place] [address]    Culture ESCHERICHIA COLI (A)  Final   Report Status 01/13/2021 FINAL  Final   Organism ID, Bacteria ESCHERICHIA COLI  Final      Susceptibility   Escherichia coli - MIC*    AMPICILLIN >=32 RESISTANT Resistant     CEFAZOLIN <=4 SENSITIVE Sensitive     CEFEPIME <=0.12 SENSITIVE Sensitive     CEFTAZIDIME <=1 SENSITIVE Sensitive      CEFTRIAXONE <=0.25 SENSITIVE Sensitive     CIPROFLOXACIN <=0.25 SENSITIVE Sensitive     GENTAMICIN 8 INTERMEDIATE Intermediate     IMIPENEM <=0.25 SENSITIVE Sensitive     TRIMETH/SULFA >=320 RESISTANT Resistant     AMPICILLIN/SULBACTAM >=32 RESISTANT Resistant     PIP/TAZO <=4 SENSITIVE Sensitive     * ESCHERICHIA COLI  Blood Culture ID Panel (Reflexed)     Status: Abnormal   Collection Time: 01/10/21 12:09 AM  Result Value Ref Range Status   Enterococcus faecalis NOT DETECTED NOT DETECTED Final   Enterococcus Faecium NOT DETECTED NOT DETECTED Final   Listeria monocytogenes NOT DETECTED NOT DETECTED Final   Staphylococcus species NOT DETECTED NOT DETECTED Final   Staphylococcus aureus (BCID) NOT DETECTED NOT DETECTED Final   Staphylococcus epidermidis NOT DETECTED NOT DETECTED Final   Staphylococcus lugdunensis NOT DETECTED NOT DETECTED Final   Streptococcus species NOT DETECTED NOT DETECTED Final   Streptococcus agalactiae NOT DETECTED NOT DETECTED Final   Streptococcus pneumoniae NOT DETECTED NOT DETECTED Final   Streptococcus pyogenes NOT DETECTED NOT DETECTED Final   A.calcoaceticus-baumannii NOT DETECTED NOT DETECTED Final   Bacteroides fragilis NOT DETECTED NOT DETECTED Final   Enterobacterales DETECTED (A) NOT DETECTED Final    Comment: Enterobacterales represent a large order of gram negative bacteria, not a single organism. CRITICAL RESULT CALLED TO, READ BACK BY AND VERIFIED WITH: [place], PHARMD AT 1350 ON 01/10/21 BY GM    Enterobacter cloacae complex NOT DETECTED NOT DETECTED Final   Escherichia coli DETECTED (A) NOT DETECTED Final    Comment: CRITICAL RESULT CALLED TO, READ BACK BY AND VERIFIED WITH: [place], PHARMD AT 1350 ON 01/10/21 BY GM    Klebsiella aerogenes NOT DETECTED NOT DETECTED Final   Klebsiella oxytoca NOT DETECTED NOT DETECTED Final   Klebsiella pneumoniae NOT DETECTED NOT DETECTED Final   Proteus species NOT DETECTED NOT DETECTED Final    Salmonella species NOT DETECTED NOT DETECTED Final   Serratia marcescens NOT DETECTED NOT DETECTED Final   Haemophilus influenzae NOT DETECTED NOT DETECTED Final   Neisseria meningitidis NOT DETECTED NOT DETECTED Final   Pseudomonas aeruginosa NOT DETECTED NOT DETECTED Final   Stenotrophomonas maltophilia NOT DETECTED NOT DETECTED Final   Candida albicans NOT DETECTED NOT DETECTED Final  Candida auris NOT DETECTED NOT DETECTED Final   Candida glabrata NOT DETECTED NOT DETECTED Final   Candida krusei NOT DETECTED NOT DETECTED Final   Candida parapsilosis NOT DETECTED NOT DETECTED Final   Candida tropicalis NOT DETECTED NOT DETECTED Final   Cryptococcus neoformans/gattii NOT DETECTED NOT DETECTED Final   CTX-M ESBL NOT DETECTED NOT DETECTED Final   Carbapenem resistance IMP NOT DETECTED NOT DETECTED Final   Carbapenem resistance KPC NOT DETECTED NOT DETECTED Final   Carbapenem resistance NDM NOT DETECTED NOT DETECTED Final   Carbapenem resist OXA 48 LIKE NOT DETECTED NOT DETECTED Final   Carbapenem resistance VIM NOT DETECTED NOT DETECTED Final    Comment: Performed at [facility], [address] [place], [place] [address]  Urine Culture     Status: Abnormal   Collection Time: 01/10/21  1:05 PM   Specimen: Urine, Random  Result Value Ref Range Status   Specimen Description   Final    URINE, RANDOM Performed at [facility], [address], [place], [place] [address]    Special Requests   Final    NONE Performed at [facility], [address] [place],  [address]    Culture MULTIPLE SPECIES PRESENT, SUGGEST RECOLLECTION (A)  Final   Report Status 01/11/2021 FINAL  Final  MRSA Next Gen by PCR, Nasal     Status: None   Collection Time: 01/27/21  7:36 PM   Specimen: Nasal Mucosa; Nasal Swab  Result Value Ref Range Status   MRSA by PCR Next Gen NOT DETECTED NOT DETECTED Final    Comment: (NOTE) The GeneXpert MRSA Assay (FDA approved for  NASAL specimens only), is one component of a comprehensive MRSA colonization surveillance program. It is not intended to diagnose MRSA infection nor to guide or monitor treatment for MRSA infections. Test performance is not FDA approved in patients less than 62 years old. Performed at [facility], [address] [place],  [address]   Resp Panel by RT-PCR (Flu A&B, Covid) Nasopharyngeal Swab     Status: None   Collection Time: 01/29/21 11:55 AM   Specimen: Nasopharyngeal Swab; Nasopharyngeal(NP) swabs in vial transport medium  Result Value Ref Range Status   SARS Coronavirus 2 by RT PCR NEGATIVE NEGATIVE Final    Comment: (NOTE) SARS-CoV-2 target nucleic acids are NOT DETECTED.  The SARS-CoV-2 RNA is generally detectable in upper respiratory specimens during the acute phase of infection. The lowest concentration of SARS-CoV-2 viral copies this assay can detect is 138 copies/mL. A negative result does not preclude SARS-Cov-2 infection and should not be used as the sole basis for treatment or other patient management decisions. A negative result may occur with  improper specimen collection/handling, submission of specimen other than nasopharyngeal swab, presence of viral mutation(s) within the areas targeted by this assay, and inadequate number of viral copies(<138 copies/mL). A negative result must be combined with clinical observations, patient history, and epidemiological information. The expected result is Negative.  Fact Sheet for Patients:  EntrepreneurPulse.com.au  Fact Sheet for Healthcare Providers:  IncredibleEmployment.be  This test is no t yet approved or cleared by the [place] FDA and  has been authorized for detection and/or diagnosis of SARS-CoV-2 by FDA under an Emergency Use Authorization (EUA). This EUA will remain  in effect (meaning this test can be used) for the duration of the COVID-19 declaration  under Section 564(b)(1) of the Act, 21 U.S.C.section 360bbb-3(b)(1), unless the authorization is terminated  or revoked sooner.  Influenza A by PCR NEGATIVE NEGATIVE Final   Influenza B by PCR NEGATIVE NEGATIVE Final    Comment: (NOTE) The Xpert Xpress SARS-CoV-2/FLU/RSV plus assay is intended as an aid in the diagnosis of influenza from Nasopharyngeal swab specimens and should not be used as a sole basis for treatment. Nasal washings and aspirates are unacceptable for Xpert Xpress SARS-CoV-2/FLU/RSV testing.  Fact Sheet for Patients: EntrepreneurPulse.com.au  Fact Sheet for Healthcare Providers: IncredibleEmployment.be  This test is not yet approved or cleared by the [place] FDA and has been authorized for detection and/or diagnosis of SARS-CoV-2 by FDA under an Emergency Use Authorization (EUA). This EUA will remain in effect (meaning this test can be used) for the duration of the COVID-19 declaration under Section 564(b)(1) of the Act, 21 U.S.C. section 360bbb-3(b)(1), unless the authorization is terminated or revoked.  Performed at [facility], [address] [place], [place] [address]     Signed:  Berle Mull MD.  [facility] 01/29/2021, 2:11 PM

## 2021-01-29 NOTE — Progress Notes (Signed)
Occupational Therapy Treatment Patient Details Name: Sandra Massey MRN: [id number] DOB: Jun 29, 1950 Today's Date: 01/29/2021   History of present illness Pt is a 70 y/o F admitted on 01/09/21 after presenting from SNF with c/o acute AMS. Pt found to have abnormal electrical activity on EEG & neurology was consulted. Pt was also found to be septic with blood cultures positive for E. coli. PMH: CVA with residual R sided hemiparesis, chronic a-fib, DM2, HTN   OT comments  Sandra Massey continues to present with generalized weakness, very limited endurance, cognitive impairment. She is able to provide her name today but does not respond to any other questions. She cannot quantify her pain level, but she yells out with movement, in particular any movement of R UE. Pt's R hand curled into tight fist, therapist repositions with rolled cloth, which, based on pt's facial expressions, appears to relieve pain. Given wet cloth and comb, pt is able to use L UE to wash face and comb hair, requiring VCs for initiating and sequencing tasks. Pt uses L UE to participate in bed mobility, rolling for pressure relief and improved comfort. Will continue to provide OT services while pt is hospitalized.   Recommendations for follow up therapy are one component of a multi-disciplinary discharge planning process, led by the attending physician.  Recommendations may be updated based on patient status, additional functional criteria and insurance authorization.    Follow Up Recommendations  Long-term institutional care without follow-up therapy    Assistance Recommended at Discharge Frequent or constant Supervision/Assistance  Equipment Recommendations  None recommended by OT    Recommendations for Other Services      Precautions / Restrictions Precautions Precautions: Fall Restrictions Weight Bearing Restrictions: No       Mobility Bed Mobility Overal bed mobility: Needs Assistance Bed Mobility: Rolling Rolling: Max  assist         General bed mobility comments: Pt able to use L UE to assist w/ bed mobility    Transfers                   General transfer comment: unable     Balance Overall balance assessment: Needs assistance   Sitting balance-Leahy Scale: Zero       Standing balance-Leahy Scale: Zero                             ADL either performed or assessed with clinical judgement   ADL       Grooming: Wash/dry face;Brushing hair Grooming Details (indicate cue type and reason): with cues for initiation and sequencing                             Functional mobility during ADLs: Maximal assistance;+2 for physical assistance      Extremity/Trunk Assessment Upper Extremity Assessment Upper Extremity Assessment: Generalized weakness RUE Deficits / Details: R hand in fisted position RUE: Unable to fully assess due to pain;Shoulder pain with ROM   Lower Extremity Assessment Lower Extremity Assessment: Generalized weakness RLE Deficits / Details: R side hemiplegia        Vision       Perception     Praxis      Cognition Arousal/Alertness: Awake/alert Behavior During Therapy: Flat affect;Anxious Overall Cognitive Status: History of cognitive impairments - at baseline  General Comments: Able to provide name only          Exercises Other Exercises Other Exercises: Bed mobility, grooming in supine, repositioning for comfort   Shoulder Instructions       General Comments      Pertinent Vitals/ Pain       Pain Assessment: PAINAD Breathing: occasional labored breathing, short period of hyperventilation Negative Vocalization: repeated troubled calling out, loud moaning/groaning, crying Facial Expression: facial grimacing Body Language: tense, distressed pacing, fidgeting Consolability: distracted or reassured by voice/touch PAINAD Score: 7 Pain Location: generalized Pain  Intervention(s): Repositioned;Monitored during session  Home Living                                          Prior Functioning/Environment              Frequency  Min 2X/week        Progress Toward Goals  OT Goals(current goals can now be found in the care plan section)  Progress towards OT goals: Progressing toward goals  Acute Rehab OT Goals Patient Stated Goal: pt unable to participate in goal-setting OT Goal Formulation: With patient Time For Goal Achievement: 01/10/2021 Potential to Achieve Goals: [place] Discharge plan remains appropriate;Frequency remains appropriate    Co-evaluation                 AM-PAC OT "6 Clicks" Daily Activity     Outcome Measure   Help from another person eating meals?: A Little Help from another person taking care of personal grooming?: A Lot Help from another person toileting, which includes using toliet, bedpan, or urinal?: Total Help from another person bathing (including washing, rinsing, drying)?: A Lot Help from another person to put on and taking off regular upper body clothing?: A Lot Help from another person to put on and taking off regular lower body clothing?: Total 6 Click Score: 11    End of Session    OT Visit Diagnosis: Muscle weakness (generalized) (M62.81);Other abnormalities of gait and mobility (R26.89)   Activity Tolerance Patient limited by lethargy   Patient Left in bed;with bed alarm set;with call bell/phone within reach   Nurse Communication          Time: 0900-0909 OT Time Calculation (min): 9 min  Charges: OT General Charges $OT Visit: 1 Visit OT Treatments $Self Care/Home Management : 8-22 mins  Sandra Lobo, PhD, MS, OTR/L 01/29/21, 12:34 PM

## 2021-01-29 NOTE — Assessment & Plan Note (Signed)
Sepsis present on admission with SIRS criteria met with tachycardia tachypnea and confusion. cultures positive for E. Coli. Sepsis resolved. -Ancef (11/7-11/12), per ID - CTX (11/3-11/6) - cefepime (11/2) - flagyl 11/2 - vancomycin 11/2.

## 2021-01-29 NOTE — Assessment & Plan Note (Signed)
Rate controlled with amiodarone. On Eliquis.

## 2021-01-29 NOTE — Care Management Important Message (Signed)
Important Message  Patient Details  Name: Sandra Massey MRN: [id number] Date of Birth: March 26, 1950   Medicare Important Message Given:  Other (see comment)  I left a message for the patient's sister, Elky Funches [phone]) asking for a return call to review the Important Message from Medicare. Will await for a return call.    Juliann Pulse A [place] 01/29/2021, 2:44 PM

## 2021-01-29 NOTE — Assessment & Plan Note (Signed)
Presented with sepsis.  Also had AKI at the time of admission. Renal function did not improve.  Encephalopathy worsened at throughout the course of the hospitalization. Nephrology following - HD y'day Patient was started on HD at the request of the family. Currently has a permanent dialysis catheter placed. Has a chair time outpatient as well. Awaiting SNF arrangements.

## 2021-01-29 NOTE — Assessment & Plan Note (Signed)
Placing the patient at high risk of poor outcome. Body mass index is 39.47 kg/m.

## 2021-01-29 NOTE — Assessment & Plan Note (Signed)
Sacral, stage II. Continue foam dressing.

## 2021-01-29 NOTE — Progress Notes (Signed)
[facility]  PROGRESS NOTE   Subjective:   No c/o today Appetite appears to be poor as food tray is sitting untouched  Objective:  Vital signs in last 24 hours:  Temp:  [97.7 F (36.5 C)-98.8 F (37.1 C)] 97.7 F (36.5 C) (11/21 1100) Pulse Rate:  [60-83] 83 (11/21 1100) Resp:  [16-19] 16 (11/21 1100) BP: (82-150)/(63-72) 114/72 (11/21 1100) SpO2:  [95 %-98 %] 97 % (11/21 1100)  Weight change:  Filed Weights   01/23/21 0500 01/23/21 0845 01/24/21 0337  Weight: 113.9 kg 113.9 kg 114.3 kg    Intake/Output: I/O last 3 completed shifts: In: 120 [P.O.:120] Out: 0    Intake/Output this shift:  No intake/output data recorded.  Physical Exam: General:  No acute distress, laying in bed  Head:  Normocephalic, atraumatic. Moist oral mucosal membranes  Lungs:   Clear to auscultation, normal effort  Heart:  regular  Abdomen:   Soft, nontender, bowel sounds present  Extremities:  Trace peripheral edema.  Neurologic:  oriented to self and place  Skin:  No lesions  Access: RIJ permcath 11/15 Dr. Delana Meyer    Basic Metabolic Panel: Recent Labs  Lab 01/23/21 0857 01/25/21 0805 01/27/21 0747  NA 135 137 138  K 4.9 4.2 4.2  CL 98 98 102  CO2 28 28 31   GLUCOSE 135* 175* 115*  BUN 41* 34* 30*  CREATININE 4.73* 4.24* 3.98*  CALCIUM 8.5* 8.4* 9.4  PHOS 6.0* 4.7* 4.7*     CBC: Recent Labs  Lab 01/23/21 0857 01/25/21 0805 01/27/21 0747  WBC 12.4* 8.5 8.8  HGB 8.4* 7.7* 7.9*  HCT 26.6* 25.1* 26.4*  MCV 89.6 92.6 96.7  PLT 358 397 383      Urinalysis: No results for input(s): COLORURINE, LABSPEC, PHURINE, GLUCOSEU, HGBUR, BILIRUBINUR, KETONESUR, PROTEINUR, UROBILINOGEN, NITRITE, LEUKOCYTESUR in the last 72 hours.  Invalid input(s): APPERANCEUR    Imaging: No results found.   Medications:    sodium chloride 10 mL/hr at 01/17/21 0427    (feeding supplement) PROSource Plus  30 mL Oral TID BM   amiodarone  200 mg Oral BID   apixaban  5 mg Oral  BID   Chlorhexidine Gluconate Cloth  6 each Topical Daily   docusate sodium  100 mg Oral Daily   epoetin (EPOGEN/PROCRIT) injection  10,000 Units Intravenous Q T,Th,Sa-HD   feeding supplement (NEPRO CARB STEADY)  237 mL Oral BID BM   heparin sodium (porcine)  2,000 Units Intracatheter Once   insulin aspart  0-15 Units Subcutaneous TID WC   insulin glargine-yfgn  15 Units Subcutaneous Daily   levETIRAcetam  250 mg Oral Once per day on Tue Thu Sat   levETIRAcetam  500 mg Oral Daily   multivitamin  1 tablet Oral QHS   QUEtiapine  12.5 mg Oral QHS   rosuvastatin  10 mg Oral QPM    Assessment/ Plan:     Principal Problem:   Acute delirium Active Problems:   Chronic a-fib (HCC)   Pressure injury of skin   History of ischemic cerebrovascular accident (CVA) with residual deficit   ESRD (end stage renal disease) ([place])   Sepsis due to Escherichia coli (E. coli) ([place])   Anemia due to chronic kidney disease   Obesity (BMI 30-39.9)   Sandra Massey is a 70 y.o. white female SNF resident with atrial fibrillation, diabetes mellitus type II, hypertension, CVA who is admitted to [facility] on 01/09/2021 for UTI (urinary tract infection) [N39.0] Speech abnormality [V03.9]  MRI contraindicated due to metal implant [Z53.09] Sepsis ([place]) [A41.9] Cerebrovascular accident (CVA), unspecified mechanism ([place]) [I63.9] AMS (altered mental status) [R41.82]  #1: End-stage renal disease: patient's family wants to pursue outpatient hemodialysis. ESRD secondary to acute kidney injury with no recovery.  - Continue TTS schedule: outpatient planning [facility] 6:45 am - Patient to live at [facility] with her sister as her roommate.   #2:  Diabetes mellitus type II with chronic kidney disease: insulin dependent. History of poor control.   - Continue glucose control  #3: Anemia with chronic kidney disease: hemoglobin 7.9. normocytic. Iron studies consistent with iron deficiency anemia. Hemoglobin A1c of 6.9%.   - EPO with HD treatments.  - will start IV iron as outpatient.    LOS: [place], MD [facility] 11/21/202212:52 PM

## 2021-01-29 NOTE — Assessment & Plan Note (Signed)
residual right-sided hemiparesis and expressive aphasia as well as dysphagia. - assist with feeds. -Continue home rosuvastatin.

## 2021-01-29 NOTE — TOC Transition Note (Signed)
Transition of Care [facility]) - CM/SW Discharge Note   Patient Details  Name: Sandra Massey MRN: [id number] Date of Birth: 1950-08-11  [facility] CM/SW Contact:  Candie Chroman, LCSW Phone Number: 01/29/2021, 2:21 PM   Clinical Narrative:  Patient has orders to discharge back to [facility] today. RN will call report to [phone] (Room 612B). Tried calling sister twice but no answer/unable to leave voicemail. Asked [facility] admissions coordinator to update her. EMS transport set up for 3:00. No further concerns. CSW signing off.   Final next level of care: Skilled Nursing Facility Barriers to Discharge: Barriers Resolved   Patient Goals and CMS Choice   CMS Medicare.gov Compare Post Acute Care list provided to:: Patient Represenative (must comment) ([facility]) Choice offered to / list presented to : NA  Discharge Placement   Existing PASRR number confirmed : 01/29/21          Patient chooses bed at: [facility] [place] Patient to be transferred to facility by: EMS Name of family member notified: Tried calling sister [name] but no answer. Asked SNF admissions coordinator to let her know. Patient and family notified of of transfer: 01/29/21  Discharge Plan and Services                                     Social Determinants of Health (SDOH) Interventions     Readmission Risk Interventions No flowsheet data found.

## 2021-01-29 NOTE — Progress Notes (Signed)
Pt to d/c to [facility], report called to [place]. Pt awaiting transport.

## 2021-01-29 NOTE — Assessment & Plan Note (Signed)
In the setting of sepsis. Intermittent agitation. Continue Seroquel & as needed Haldol. Neurology- Extensive work-up performed throughout the hospital course including an EEG which showed possible epileptogenic foci and therefore neuro started on Keppra.

## 2021-01-29 NOTE — Assessment & Plan Note (Signed)
Patient is Jehova's witness. Hgb very gradually declining throughout hospitalization likely in part due to dilution with significant IV fluids. Hgb 9.2>8.7>8.8> 7.7> 7.9.  Nephrology planning outpatient IV iron

## 2021-01-29 NOTE — Care Management Important Message (Signed)
Important Message  Patient Details  Name: Sandra Massey MRN: [id number] Date of Birth: [birth year]   Medicare Important Message Given:  Yes  I talked with the patient's sister and reviewed the Important Message from Medicare with her and she is in agreement with today's discharge. I asked if she would like a copy and she said it wasn't necessary. I thanked her for her time.    Sandra Massey 01/29/2021, 3:53 PM

## 2021-02-08 DEATH — deceased
